# Patient Record
Sex: Male | Born: 1955 | Race: White | Hispanic: No | Marital: Married | Smoking: Never smoker
Health system: Southern US, Community
[De-identification: ages and names within clinical notes are randomized; demographics above are authoritative.]

## PROBLEM LIST (undated history)

## (undated) DIAGNOSIS — K219 Gastro-esophageal reflux disease without esophagitis: Secondary | ICD-10-CM

## (undated) DIAGNOSIS — F419 Anxiety disorder, unspecified: Secondary | ICD-10-CM

## (undated) DIAGNOSIS — Z87442 Personal history of urinary calculi: Secondary | ICD-10-CM

## (undated) DIAGNOSIS — I10 Essential (primary) hypertension: Secondary | ICD-10-CM

## (undated) DIAGNOSIS — E785 Hyperlipidemia, unspecified: Secondary | ICD-10-CM

## (undated) DIAGNOSIS — N400 Enlarged prostate without lower urinary tract symptoms: Secondary | ICD-10-CM

## (undated) DIAGNOSIS — T753XXA Motion sickness, initial encounter: Secondary | ICD-10-CM

## (undated) DIAGNOSIS — N200 Calculus of kidney: Secondary | ICD-10-CM

## (undated) DIAGNOSIS — G4730 Sleep apnea, unspecified: Secondary | ICD-10-CM

## (undated) DIAGNOSIS — L509 Urticaria, unspecified: Secondary | ICD-10-CM

## (undated) DIAGNOSIS — R55 Syncope and collapse: Secondary | ICD-10-CM

## (undated) DIAGNOSIS — K5730 Diverticulosis of large intestine without perforation or abscess without bleeding: Secondary | ICD-10-CM

## (undated) DIAGNOSIS — R3129 Other microscopic hematuria: Secondary | ICD-10-CM

## (undated) HISTORY — DX: Calculus of kidney: N20.0

## (undated) HISTORY — DX: Syncope and collapse: R55

## (undated) HISTORY — PX: TONSILLECTOMY: SUR1361

## (undated) HISTORY — DX: Gilbert syndrome: E80.4

## (undated) HISTORY — DX: Sleep apnea, unspecified: G47.30

## (undated) HISTORY — PX: LIVER BIOPSY: SHX301

## (undated) HISTORY — DX: Other microscopic hematuria: R31.29

## (undated) HISTORY — DX: Hyperlipidemia, unspecified: E78.5

## (undated) HISTORY — DX: Diverticulosis of large intestine without perforation or abscess without bleeding: K57.30

## (undated) HISTORY — DX: Urticaria, unspecified: L50.9

## (undated) HISTORY — DX: Benign prostatic hyperplasia without lower urinary tract symptoms: N40.0

## (undated) HISTORY — DX: Essential (primary) hypertension: I10

## (undated) HISTORY — PX: APPENDECTOMY: SHX54

---

## 1993-03-25 HISTORY — PX: UPPER GASTROINTESTINAL ENDOSCOPY: SHX188

## 2000-02-13 ENCOUNTER — Encounter: Payer: Self-pay | Admitting: Internal Medicine

## 2000-02-13 ENCOUNTER — Ambulatory Visit (HOSPITAL_COMMUNITY): Admission: RE | Admit: 2000-02-13 | Discharge: 2000-02-13 | Payer: Self-pay | Admitting: Internal Medicine

## 2000-03-25 HISTORY — PX: COLONOSCOPY W/ POLYPECTOMY: SHX1380

## 2000-04-11 ENCOUNTER — Other Ambulatory Visit: Admission: RE | Admit: 2000-04-11 | Discharge: 2000-04-11 | Payer: Self-pay | Admitting: Gastroenterology

## 2003-10-10 ENCOUNTER — Encounter: Admission: RE | Admit: 2003-10-10 | Discharge: 2003-10-10 | Payer: Self-pay | Admitting: Internal Medicine

## 2003-12-29 ENCOUNTER — Encounter (INDEPENDENT_AMBULATORY_CARE_PROVIDER_SITE_OTHER): Payer: Self-pay | Admitting: Specialist

## 2003-12-29 ENCOUNTER — Ambulatory Visit (HOSPITAL_COMMUNITY): Admission: RE | Admit: 2003-12-29 | Discharge: 2003-12-29 | Payer: Self-pay | Admitting: Gastroenterology

## 2004-02-10 ENCOUNTER — Ambulatory Visit: Payer: Self-pay | Admitting: Family Medicine

## 2004-02-21 ENCOUNTER — Ambulatory Visit: Payer: Self-pay | Admitting: Family Medicine

## 2004-03-22 ENCOUNTER — Ambulatory Visit: Payer: Self-pay | Admitting: Internal Medicine

## 2004-06-22 ENCOUNTER — Ambulatory Visit: Payer: Self-pay | Admitting: Internal Medicine

## 2004-06-29 ENCOUNTER — Ambulatory Visit: Payer: Self-pay | Admitting: Internal Medicine

## 2004-11-16 ENCOUNTER — Ambulatory Visit: Payer: Self-pay | Admitting: Internal Medicine

## 2005-07-26 ENCOUNTER — Ambulatory Visit: Payer: Self-pay | Admitting: Internal Medicine

## 2005-08-09 ENCOUNTER — Ambulatory Visit: Payer: Self-pay | Admitting: Internal Medicine

## 2005-09-16 ENCOUNTER — Ambulatory Visit: Payer: Self-pay | Admitting: Internal Medicine

## 2005-09-20 ENCOUNTER — Ambulatory Visit: Payer: Self-pay | Admitting: Internal Medicine

## 2005-09-24 ENCOUNTER — Ambulatory Visit: Payer: Self-pay | Admitting: Internal Medicine

## 2005-10-23 ENCOUNTER — Ambulatory Visit: Payer: Self-pay | Admitting: Internal Medicine

## 2006-02-26 ENCOUNTER — Ambulatory Visit: Payer: Self-pay | Admitting: Internal Medicine

## 2006-03-13 ENCOUNTER — Ambulatory Visit: Payer: Self-pay | Admitting: Internal Medicine

## 2007-02-23 DIAGNOSIS — R55 Syncope and collapse: Secondary | ICD-10-CM

## 2007-02-23 HISTORY — DX: Syncope and collapse: R55

## 2007-04-03 ENCOUNTER — Ambulatory Visit: Payer: Self-pay | Admitting: Internal Medicine

## 2007-04-03 DIAGNOSIS — R740 Nonspecific elevation of levels of transaminase and lactic acid dehydrogenase [LDH]: Secondary | ICD-10-CM

## 2007-04-03 DIAGNOSIS — R55 Syncope and collapse: Secondary | ICD-10-CM

## 2007-04-03 DIAGNOSIS — D126 Benign neoplasm of colon, unspecified: Secondary | ICD-10-CM | POA: Insufficient documentation

## 2007-04-07 ENCOUNTER — Ambulatory Visit: Payer: Self-pay | Admitting: Internal Medicine

## 2007-04-07 ENCOUNTER — Encounter (INDEPENDENT_AMBULATORY_CARE_PROVIDER_SITE_OTHER): Payer: Self-pay | Admitting: *Deleted

## 2007-04-17 ENCOUNTER — Ambulatory Visit: Payer: Self-pay | Admitting: Internal Medicine

## 2007-04-17 DIAGNOSIS — Z860100 Personal history of colon polyps, unspecified: Secondary | ICD-10-CM | POA: Insufficient documentation

## 2007-04-17 DIAGNOSIS — I10 Essential (primary) hypertension: Secondary | ICD-10-CM | POA: Insufficient documentation

## 2007-04-17 DIAGNOSIS — R5381 Other malaise: Secondary | ICD-10-CM | POA: Insufficient documentation

## 2007-04-17 DIAGNOSIS — R5383 Other fatigue: Secondary | ICD-10-CM

## 2007-09-10 ENCOUNTER — Ambulatory Visit: Payer: Self-pay | Admitting: Internal Medicine

## 2007-09-10 DIAGNOSIS — D1739 Benign lipomatous neoplasm of skin and subcutaneous tissue of other sites: Secondary | ICD-10-CM

## 2007-09-10 DIAGNOSIS — M7580 Other shoulder lesions, unspecified shoulder: Secondary | ICD-10-CM

## 2007-09-10 DIAGNOSIS — E785 Hyperlipidemia, unspecified: Secondary | ICD-10-CM | POA: Insufficient documentation

## 2007-09-20 LAB — CONVERTED CEMR LAB
ALT: 72 units/L — ABNORMAL HIGH (ref 0–53)
AST: 45 units/L — ABNORMAL HIGH (ref 0–37)
Basophils Absolute: 0 10*3/uL (ref 0.0–0.1)
Basophils Relative: 0.1 % (ref 0.0–1.0)
Cholesterol: 152 mg/dL (ref 0–200)
Eosinophils Absolute: 0.1 10*3/uL (ref 0.0–0.7)
Eosinophils Relative: 1.7 % (ref 0.0–5.0)
HCT: 43.4 % (ref 39.0–52.0)
HDL: 34.7 mg/dL — ABNORMAL LOW (ref >39.0)
Hemoglobin: 15.2 g/dL (ref 13.0–17.0)
LDL Cholesterol: 104 mg/dL — ABNORMAL HIGH (ref 0–99)
Lymphocytes Relative: 32.1 % (ref 12.0–46.0)
MCHC: 35.1 g/dL (ref 30.0–36.0)
MCV: 91.5 fL (ref 78.0–100.0)
Monocytes Absolute: 0.4 10*3/uL (ref 0.1–1.0)
Monocytes Relative: 8.2 % (ref 3.0–12.0)
Neutro Abs: 2.8 10*3/uL (ref 1.4–7.7)
Neutrophils Relative %: 57.9 % (ref 43.0–77.0)
PSA: 0.87 ng/mL (ref 0.10–4.00)
Platelets: 174 10*3/uL (ref 150–400)
RBC: 4.74 M/uL (ref 4.22–5.81)
RDW: 11.7 % (ref 11.5–14.6)
TSH: 2.8 microintl units/mL (ref 0.35–5.50)
Total CHOL/HDL Ratio: 4.4
Triglycerides: 65 mg/dL (ref 0–149)
VLDL: 13 mg/dL (ref 0–40)
WBC: 4.8 10*3/uL (ref 4.5–10.5)

## 2007-09-22 ENCOUNTER — Encounter (INDEPENDENT_AMBULATORY_CARE_PROVIDER_SITE_OTHER): Payer: Self-pay | Admitting: *Deleted

## 2007-09-28 ENCOUNTER — Encounter: Payer: Self-pay | Admitting: Internal Medicine

## 2007-11-09 ENCOUNTER — Encounter: Payer: Self-pay | Admitting: Internal Medicine

## 2008-03-25 HISTORY — PX: COLONOSCOPY: SHX174

## 2008-05-06 ENCOUNTER — Telehealth (INDEPENDENT_AMBULATORY_CARE_PROVIDER_SITE_OTHER): Payer: Self-pay | Admitting: *Deleted

## 2008-05-06 ENCOUNTER — Ambulatory Visit: Payer: Self-pay | Admitting: Internal Medicine

## 2008-05-06 DIAGNOSIS — R109 Unspecified abdominal pain: Secondary | ICD-10-CM | POA: Insufficient documentation

## 2008-05-06 DIAGNOSIS — R319 Hematuria, unspecified: Secondary | ICD-10-CM

## 2008-05-06 LAB — CONVERTED CEMR LAB
Bilirubin Urine: NEGATIVE
Glucose, Urine, Semiquant: NEGATIVE
Ketones, urine, test strip: NEGATIVE
Nitrite: NEGATIVE
Protein, U semiquant: NEGATIVE
Specific Gravity, Urine: 1.015
Urobilinogen, UA: 0.2
WBC Urine, dipstick: NEGATIVE
pH: 7

## 2008-05-09 ENCOUNTER — Encounter (INDEPENDENT_AMBULATORY_CARE_PROVIDER_SITE_OTHER): Payer: Self-pay | Admitting: *Deleted

## 2008-05-09 ENCOUNTER — Telehealth (INDEPENDENT_AMBULATORY_CARE_PROVIDER_SITE_OTHER): Payer: Self-pay | Admitting: *Deleted

## 2008-05-25 ENCOUNTER — Encounter: Payer: Self-pay | Admitting: Internal Medicine

## 2008-06-15 ENCOUNTER — Ambulatory Visit: Payer: Self-pay | Admitting: Internal Medicine

## 2008-06-15 DIAGNOSIS — N400 Enlarged prostate without lower urinary tract symptoms: Secondary | ICD-10-CM | POA: Insufficient documentation

## 2008-06-15 LAB — CONVERTED CEMR LAB
Bilirubin Urine: NEGATIVE
Glucose, Urine, Semiquant: NEGATIVE
Ketones, urine, test strip: NEGATIVE
Nitrite: NEGATIVE
Protein, U semiquant: NEGATIVE
Specific Gravity, Urine: 1.025
Urobilinogen, UA: 0.2
WBC Urine, dipstick: NEGATIVE
pH: 6

## 2008-06-19 LAB — CONVERTED CEMR LAB
ALT: 42 units/L (ref 0–53)
Albumin: 4.1 g/dL (ref 3.5–5.2)
Alkaline Phosphatase: 76 units/L (ref 39–117)
Bilirubin, Direct: 0 mg/dL (ref 0.0–0.3)
Calcium: 9 mg/dL (ref 8.4–10.5)
Total Protein: 7.2 g/dL (ref 6.0–8.3)

## 2008-06-21 ENCOUNTER — Encounter (INDEPENDENT_AMBULATORY_CARE_PROVIDER_SITE_OTHER): Payer: Self-pay | Admitting: *Deleted

## 2008-07-08 ENCOUNTER — Encounter: Payer: Self-pay | Admitting: Internal Medicine

## 2008-12-06 ENCOUNTER — Ambulatory Visit: Payer: Self-pay | Admitting: Gastroenterology

## 2008-12-29 ENCOUNTER — Telehealth: Payer: Self-pay | Admitting: Gastroenterology

## 2009-01-02 ENCOUNTER — Ambulatory Visit: Payer: Self-pay | Admitting: Gastroenterology

## 2009-01-02 LAB — HM COLONOSCOPY
HM Colonoscopy: NORMAL
HM Colonoscopy: NORMAL

## 2009-01-10 ENCOUNTER — Encounter: Payer: Self-pay | Admitting: Internal Medicine

## 2009-01-13 ENCOUNTER — Ambulatory Visit: Payer: Self-pay | Admitting: Family Medicine

## 2009-01-13 DIAGNOSIS — M79609 Pain in unspecified limb: Secondary | ICD-10-CM

## 2009-08-03 ENCOUNTER — Ambulatory Visit: Payer: Self-pay | Admitting: Internal Medicine

## 2009-08-03 LAB — CONVERTED CEMR LAB
Ketones, urine, test strip: NEGATIVE
Nitrite: NEGATIVE
Specific Gravity, Urine: 1.025
Urobilinogen, UA: 0.2
WBC Urine, dipstick: NEGATIVE

## 2009-08-10 LAB — CONVERTED CEMR LAB
ALT: 34 units/L (ref 0–53)
AST: 28 units/L (ref 0–37)
Albumin: 4.4 g/dL (ref 3.5–5.2)
Alkaline Phosphatase: 67 units/L (ref 39–117)
BUN: 20 mg/dL (ref 6–23)
Basophils Absolute: 0 10*3/uL (ref 0.0–0.1)
CO2: 30 meq/L (ref 19–32)
Calcium: 9.2 mg/dL (ref 8.4–10.5)
Chloride: 106 meq/L (ref 96–112)
Creatinine, Ser: 1 mg/dL (ref 0.4–1.5)
Eosinophils Relative: 1.7 % (ref 0.0–5.0)
HCT: 45.7 % (ref 39.0–52.0)
Hemoglobin: 15.8 g/dL (ref 13.0–17.0)
LDL Cholesterol: 112 mg/dL — ABNORMAL HIGH (ref 0–99)
Lymphocytes Relative: 36.2 % (ref 12.0–46.0)
Lymphs Abs: 1.6 10*3/uL (ref 0.7–4.0)
Monocytes Relative: 9.7 % (ref 3.0–12.0)
Neutro Abs: 2.2 10*3/uL (ref 1.4–7.7)
PSA: 0.89 ng/mL (ref 0.10–4.00)
RBC: 4.96 M/uL (ref 4.22–5.81)
RDW: 12.8 % (ref 11.5–14.6)
TSH: 2.94 microintl units/mL (ref 0.35–5.50)
Total CHOL/HDL Ratio: 4
Triglycerides: 94 mg/dL (ref 0.0–149.0)
WBC: 4.3 10*3/uL — ABNORMAL LOW (ref 4.5–10.5)

## 2009-08-11 ENCOUNTER — Ambulatory Visit: Payer: Self-pay | Admitting: Internal Medicine

## 2009-08-11 DIAGNOSIS — M545 Low back pain: Secondary | ICD-10-CM

## 2009-08-11 DIAGNOSIS — K5730 Diverticulosis of large intestine without perforation or abscess without bleeding: Secondary | ICD-10-CM | POA: Insufficient documentation

## 2009-08-24 ENCOUNTER — Encounter: Payer: Self-pay | Admitting: Internal Medicine

## 2009-12-27 ENCOUNTER — Encounter: Payer: Self-pay | Admitting: Internal Medicine

## 2010-04-22 LAB — CONVERTED CEMR LAB
ALT: 52 units/L (ref 0–53)
AST: 37 units/L (ref 0–37)
Albumin: 4.3 g/dL (ref 3.5–5.2)
Alkaline Phosphatase: 71 units/L (ref 39–117)
BUN: 13 mg/dL (ref 6–23)
Basophils Absolute: 0 10*3/uL (ref 0.0–0.1)
Basophils Relative: 0 % (ref 0.0–1.0)
Bilirubin, Direct: 0.1 mg/dL (ref 0.0–0.3)
CO2: 27 meq/L (ref 19–32)
Calcium: 9.4 mg/dL (ref 8.4–10.5)
Chloride: 104 meq/L (ref 96–112)
Cholesterol: 153 mg/dL (ref 0–200)
Creatinine, Ser: 1.1 mg/dL (ref 0.4–1.5)
Eosinophils Absolute: 0.1 10*3/uL (ref 0.0–0.6)
Eosinophils Relative: 1.4 % (ref 0.0–5.0)
GFR calc Af Amer: 90 mL/min
GFR calc non Af Amer: 74 mL/min
Glucose, Bld: 107 mg/dL — ABNORMAL HIGH (ref 70–99)
HCT: 46.6 % (ref 39.0–52.0)
HDL: 27.5 mg/dL — ABNORMAL LOW (ref >39.0)
Hemoglobin: 16.1 g/dL (ref 13.0–17.0)
LDL Cholesterol: 106 mg/dL — ABNORMAL HIGH (ref 0–99)
Lymphocytes Relative: 33.2 % (ref 12.0–46.0)
MCHC: 34.6 g/dL (ref 30.0–36.0)
MCV: 91.1 fL (ref 78.0–100.0)
Monocytes Absolute: 0.4 10*3/uL (ref 0.2–0.7)
Monocytes Relative: 8.4 % (ref 3.0–11.0)
Neutro Abs: 2.6 10*3/uL (ref 1.4–7.7)
Neutrophils Relative %: 57 % (ref 43.0–77.0)
Platelets: 195 10*3/uL (ref 150–400)
Potassium: 4.7 meq/L (ref 3.5–5.1)
RBC: 5.12 M/uL (ref 4.22–5.81)
RDW: 11.8 % (ref 11.5–14.6)
Sodium: 139 meq/L (ref 135–145)
TSH: 3.74 microintl units/mL (ref 0.35–5.50)
Total Bilirubin: 1 mg/dL (ref 0.3–1.2)
Total CHOL/HDL Ratio: 5.6
Total Protein: 7.1 g/dL (ref 6.0–8.3)
Triglycerides: 96 mg/dL (ref 0–149)
VLDL: 19 mg/dL (ref 0–40)
WBC: 4.6 10*3/uL (ref 4.5–10.5)

## 2010-04-24 NOTE — Miscellaneous (Signed)
Summary: Flu/[facility]  Flu/[facility]   Imported By: Lanelle Bal 04/14/2009 09:35:30  _____________________________________________________________________  External Attachment:    Type:   Image     Comment:   External Document

## 2010-04-24 NOTE — Miscellaneous (Signed)
Summary: Flu/[facility]  Flu/[facility]   Imported By: Lanelle Bal 01/13/2010 10:22:38  _____________________________________________________________________  External Attachment:    Type:   Image     Comment:   External Document

## 2010-04-24 NOTE — Assessment & Plan Note (Signed)
Summary: CPX,LABS PRIOR, RENEW MEDS,BCBS INS/RH.   Vital Signs:  Patient profile:   55 year old male Height:      73 inches Weight:      178.2 pounds BMI:     23.60 Temp:     98.4 degrees F oral Pulse rate:   80 / minute Resp:     14 per minute BP sitting:   110 / 62  (left arm) Cuff size:   large  Vitals Entered By: Shonna Chock (Aug 11, 2009 3:49 PM)  CC: Lipid Management Comments REVIEWED MED LIST, PATIENT AGREED DOSE AND INSTRUCTION CORRECT    CC:  Lipid Management.  History of Present Illness: Steven Wolf is here for a physical; he has had some intermittent R > L LS area pain for 3-4 weeks. Prior episodes in past 5 years w/o trigger or injury.Rx: none  Lipid Management History:      Positive NCEP/ATP III risk factors include male age 57 years old or older, HDL cholesterol less than 40, family history for ischemic heart disease (males less than 77 years old), and hypertension.  Negative NCEP/ATP III risk factors include non-diabetic, non-tobacco-user status, no ASHD (atherosclerotic heart disease), no prior stroke/TIA, no peripheral vascular disease, and no history of aortic aneurysm.     Preventive Screening-Counseling & Management  Alcohol-Tobacco     Smoking Status: never  Caffeine-Diet-Exercise     Does Patient Exercise: yes  Allergies: 1)  ! Pcn 2)  ! * Altace  Past History:  Past Medical History: Hypertension Colonic polyps, hx of nonspec elevation of levels of transaminase, PMH of syncope 02/2007, ? nocturnal  post micturation  Benign prostatic hypertrophy Diverticulosis, colon Microscpoic hematuria , [facility] Hyperlipidemia: [place] LDL goal = < 130  Past Surgical History: Liver biopsy for 790.4(? Altace related) Appendectomy Colon polypectomy 2002; tics  2005 & 2010, due 2020, Dr Jarold Motto Tonsillectomy X 2  Family History: Father: PCV,MI in his 26s, smoker Mother: breast cancer, RA Siblings: bro: negative P ncle: PCV,  leukemia  Social History: no diet Married Never Smoked Alcohol use-yes: minimally Regular exercise-yes:walking Occupation: Advice worker Smoking Status:  never Does Patient Exercise:  yes  Review of Systems General:  Denies chills, fatigue, fever, sleep disorder, sweats, and weight loss. Eyes:  Denies blurring, double vision, and vision loss-both eyes. ENT:  Denies difficulty swallowing and hoarseness. CV:  Denies chest pain or discomfort, leg cramps with exertion, palpitations, swelling of feet, and swelling of hands. Resp:  Denies cough, shortness of breath, and sputum productive. GI:  Denies abdominal pain, bloody stools, dark tarry stools, and indigestion. GU:  Denies discharge, dysuria, hematuria, and incontinence. MS:  Complains of low back pain; denies joint pain, joint redness, joint swelling, mid back pain, and thoracic pain. Derm:  Denies changes in nail beds, dryness, lesion(s), and rash; Tick 1 & 1/2 weeks ago. Neuro:  Denies brief paralysis, numbness, tingling, and weakness. Psych:  Denies anxiety and depression; Fluoxetine is beneficial. Endo:  Denies cold intolerance, excessive hunger, excessive thirst, excessive urination, and heat intolerance. Heme:  Denies abnormal bruising and bleeding. Allergy:  Complains of itching eyes; denies sneezing.  Physical Exam  General:  well-nourished; alert,appropriate and cooperative throughout examination Head:  Normocephalic and atraumatic without obvious abnormalities.  Eyes:  No corneal or conjunctival inflammation noted.No icterus.Perrla. Funduscopic exam benign, without hemorrhages, exudates or papilledema.  Ears:  External ear exam shows no significant lesions or deformities.  Otoscopic examination reveals clear canals, tympanic membranes are intact bilaterally  without bulging, retraction, inflammation or discharge. Hearing is grossly normal bilaterally. Nose:  External nasal examination shows no deformity or inflammation.  Nasal mucosa are pink and moist without lesions or exudates. Mouth:  Oral mucosa and oropharynx without lesions or exudates.  Teeth in good repair. Neck:  No deformities, masses, or tenderness noted. Lungs:  Normal respiratory effort, chest expands symmetrically. Lungs are clear to auscultation, no crackles or wheezes. Heart:  Normal rate and regular rhythm. S1 and S2 normal without gallop, murmur, click, rub or other extra sounds. Abdomen:  Bowel sounds positive,abdomen soft and non-tender without masses, organomegaly or hernias noted. Rectal:  Coolonoscopy 12/2008 Genitalia:  Urology 08/24/2009 Prostate:  see above Msk:  No deformity or scoliosis noted of thoracic or lumbar spine.   sat up w/o help Pulses:  R and L carotid,radial,dorsalis pedis and posterior tibial pulses are full and equal bilaterally Extremities:  No clubbing, cyanosis, edema, or deformity noted with normal full range of motion of all joints.   Neg SLR Neurologic:  alert & oriented X3, strength normal in all extremities, heel & toe gait normal, and DTRs symmetrical and normal.     Impression & Recommendations:  Problem # 1:  ROUTINE GENERAL MEDICAL EXAM@HEALTH  CARE FACL (ICD-V70.0)  Orders: EKG w/ Interpretation (93000)  Problem # 2:  HYPERLIPIDEMIA (ICD-272.4)  Orders: EKG w/ Interpretation (93000)  Problem # 3:  LOW BACK PAIN SYNDROME (ICD-724.2)  The following medications were removed from the medication list:    Tramadol Hcl 50 Mg Tabs (Tramadol hcl) .Marland Kitchen... 1-2 every 4 hours as needed His updated medication list for this problem includes:    Adult Aspirin Ec Low Strength 81 Mg Tbec (Aspirin) ..... Once daily  Problem # 4:  HEMATURIA (ICD-599.70) PMH of, as per Urology  Problem # 5:  HYPERTENSION, ESSENTIAL NOS (ICD-401.9) controlled His updated medication list for this problem includes:    Diovan 160 Mg Tabs (Valsartan) .Marland Kitchen... 1/2 once daily  Problem # 6:  NONSPEC ELEVATION OF LEVELS OF  TRANSAMINASE/LDH (ICD-790.4) resolved  Problem # 7:  CORONARY ARTERY DISEASE, PREMATURE, FAMILY HX (ICD-V17.3) F MI in 57s, but co-morbidities present Orders: EKG w/ Interpretation (93000)  Complete Medication List: 1)  Adult Aspirin Ec Low Strength 81 Mg Tbec (Aspirin) .... Once daily 2)  Co Q-10 Vitamin E Fish Oil 60-90-25-200 Caps (Dha-epa-coenzyme q10-vitamin e) .... Qd 3)  Diovan 160 Mg Tabs (Valsartan) .... 1/2 once daily 4)  Fish Oil  .... 2 daily 5)  Vit D 1000iu  .... Qd 6)  Prostate Health Complex  .... Qd 7)  Fluoxetine Hcl 10 Mg Tabs (Fluoxetine hcl) .Marland Kitchen.. 1 qd  Lipid Assessment/Plan:      Based on NCEP/ATP III, the patient's risk factor category is "2 or more risk factors and a calculated 10 year CAD risk of < 20%".  The patient's lipid goals are as follows: Total cholesterol goal is 200; LDL cholesterol goal is 130; HDL cholesterol goal is 40; Triglyceride goal is 150.  His LDL cholesterol goal has been met.    Patient Instructions: 1)  Back hygiene & exercises as discussed Prescriptions: FLUOXETINE HCL 10 MG  TABS (FLUOXETINE HCL) 1 qd  #90 Capsule x 3   Entered and Authorized by:   Marga Melnick MD   Signed by:   Marga Melnick MD on 08/11/2009   Method used:   Print then Give to Patient   RxID:   [id number] DIOVAN 160 MG  TABS (VALSARTAN) 1/2 once daily  #90  x 3   Entered and Authorized by:   Marga Melnick MD   Signed by:   Marga Melnick MD on 08/11/2009   Method used:   Print then Give to Patient   RxID:   [id number]

## 2010-04-24 NOTE — Consult Note (Signed)
Summary: [facility]  [facility]   Imported By: Lanelle Bal 09/01/2009 11:35:03  _____________________________________________________________________  External Attachment:    Type:   Image     Comment:   External Document

## 2010-06-07 ENCOUNTER — Ambulatory Visit (INDEPENDENT_AMBULATORY_CARE_PROVIDER_SITE_OTHER): Payer: BC Managed Care – PPO | Admitting: Internal Medicine

## 2010-06-07 ENCOUNTER — Encounter: Payer: Self-pay | Admitting: Internal Medicine

## 2010-06-07 DIAGNOSIS — R109 Unspecified abdominal pain: Secondary | ICD-10-CM

## 2010-06-07 DIAGNOSIS — R319 Hematuria, unspecified: Secondary | ICD-10-CM

## 2010-06-07 DIAGNOSIS — M545 Low back pain: Secondary | ICD-10-CM

## 2010-06-07 DIAGNOSIS — R300 Dysuria: Secondary | ICD-10-CM

## 2010-06-07 LAB — CONVERTED CEMR LAB
Bilirubin Urine: NEGATIVE
Glucose, Urine, Semiquant: NEGATIVE
Protein, U semiquant: NEGATIVE
Urobilinogen, UA: 0.2
pH: 7.5

## 2010-06-12 NOTE — Assessment & Plan Note (Signed)
Summary: think he has a kindney stone--PH   Vital Signs:  Patient profile:   55 year old male Weight:      179.6 pounds BMI:     23.78 Temp:     98.7 degrees F oral Pulse rate:   72 / minute Resp:     15 per minute BP sitting:   110 / 70  (right arm) Cuff size:   large  Vitals Entered By: Shonna Chock CMA (June 07, 2010 3:27 PM) CC: ? Kidney stone: dark colored urine and left lower back pain , Dysuria   CC:  ? Kidney stone: dark colored urine and left lower back pain  and Dysuria.  History of Present Illness:    Onset last week as dark urine x1;   this am he had 5 min of sharp L flank pain , "like a bad muscle cramp" followed by recurrence of dark urine. He  denies burning with urination, urinary frequency, and penile discharge.  The patient denies the following associated symptoms: nausea, vomiting, fever, shaking chills, abdominal pain, back pain, and pelvic pain.  History is significant for presumed  kidney stones in 2010; he was seen by Urology but stone   never isolated.   Current Medications (verified): 1)  Adult Aspirin Ec Low Strength 81 Mg  Tbec (Aspirin) .... Once Daily 2)  Co Q-10 Vitamin E Fish Oil 60-90-25-200  Caps (Dha-Epa-Coenzyme Q10-Vitamin E) .... Qd 3)  Diovan 160 Mg  Tabs (Valsartan) .... 1/2 Once Daily 4)  Fish Oil .... 2 Daily 5)  Vit D 1000iu .... Qd 6)  Prostate Health Complex .... Qd 7)  Fluoxetine Hcl 10 Mg  Tabs (Fluoxetine Hcl) .... 1/2 By Mouth Once Daily  Allergies: 1)  ! Pcn 2)  ! * Altace  Physical Exam  General:  well-nourished,in no acute distress; alert,appropriate and cooperative throughout examination Eyes:  No corneal or conjunctival inflammation noted. No icterus Heart:  Normal rate and regular rhythm. S1 and S2 normal without gallop, murmur, click, rub.S4 with slurring Abdomen:  Bowel sounds positive,abdomen soft and non-tender without masses, organomegaly or hernias noted. Msk:  No flank tenderness; neg SLR Skin:  Intact without  suspicious lesions or rashes   Impression & Recommendations:  Problem # 1:  FLANK PAIN, LEFT (ICD-789.09) R/O renal calculus His updated medication list for this problem includes:    Adult Aspirin Ec Low Strength 81 Mg Tbec (Aspirin) ..... Once daily    Hydrocodone-acetaminophen 5-500 Mg Tabs (Hydrocodone-acetaminophen) .Marland Kitchen... 1-2 every 6 hrs as needed for pain  Problem # 2:  HEMATURIA UNSPECIFIED (ICD-599.70)  Orders: Durable Medical Equipment (DME)  Complete Medication List: 1)  Adult Aspirin Ec Low Strength 81 Mg Tbec (Aspirin) .... Once daily 2)  Co Q-10 Vitamin E Fish Oil 60-90-25-200 Caps (Dha-epa-coenzyme q10-vitamin e) .... Qd 3)  Diovan 160 Mg Tabs (Valsartan) .... 1/2 once daily 4)  Fish Oil  .... 2 daily 5)  Vit D 1000iu  .... Qd 6)  Prostate Health Complex  .... Qd 7)  Fluoxetine Hcl 10 Mg Tabs (Fluoxetine hcl) .... 1/2 by mouth once daily 8)  Hydrocodone-acetaminophen 5-500 Mg Tabs (Hydrocodone-acetaminophen) .Marland Kitchen.. 1-2 every 6 hrs as needed for pain  Other Orders: UA Dipstick w/o Micro (manual) (86578) Specimen Handling (99000) T-Culture, Urine ([id number])  Patient Instructions: 1)  Strain urine . 2)  Drink as much fluid as you can tolerate for the next few days. To ER if worse with this note. Prescriptions: HYDROCODONE-ACETAMINOPHEN 5-500 MG TABS (HYDROCODONE-ACETAMINOPHEN)  1-2 every 6 hrs as needed for pain  #30 x 0   Entered and Authorized by:   Marga Melnick MD   Signed by:   Marga Melnick MD on 06/07/2010   Method used:   Print then Give to Patient   RxID:   [id number]    Orders Added: 1)  UA Dipstick w/o Micro (manual) [81002] 2)  Specimen Handling [99000] 3)  T-Culture, Urine [[id number]] 4)  Est. Patient Level III [30865] 5)  Durable Medical Equipment [DME]    Laboratory Results   Urine Tests    Routine Urinalysis   Color: yellow Appearance: Cloudy Glucose: negative   (Normal Range: Negative) Bilirubin: negative   (Normal  Range: Negative) Ketone: negative   (Normal Range: Negative) Spec. Gravity: 1.015   (Normal Range: 1.003-1.035) Blood: large   (Normal Range: Negative) pH: 7.5   (Normal Range: 5.0-8.0) Protein: negative   (Normal Range: Negative) Urobilinogen: 0.2   (Normal Range: 0-1) Nitrite: negative   (Normal Range: Negative) Leukocyte Esterace: negative   (Normal Range: Negative)    Comments: sent for culture

## 2010-08-10 NOTE — Assessment & Plan Note (Signed)
[facility]                          [place] OFFICE NOTE   Steven Wolf, Steven Wolf                    MRN:          [id number]  DATE:10/23/2005                            DOB:          03/22/1956    Steven Wolf [facility]) has had various paresthesias and admits to being  concerned about having multiple sclerosis.   He first described burning in his tongue in May 2007.  He stated that he had  noted a metallic taste after taking antibiotics and after dental work in the  fall of 2006.  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  Observation was recommended.  I recommended multivitamins.   On June 25, he stated that the metallic taste had resolved after several  weeks but he continued to have a burning sensation along the lateral aspect  of his tongue.  It was better in the morning and worse with certain foods.  Acidic or bitter foods would aggravate it.  Exam revealed slight ptosis of  the right eye but no localizing neurologic signs.  He does have slight  asymmetry of the nasolabial folds.  He also has thinning of the eyebrows  laterally.   CBC and differential and CMET were normal except for mild elevation of the  SGOT and SGPT.  Hepatic enzyme elevations had been evaluated by Dr.  Jarold Motto to include a liver biopsy which was negative.   Normal negative studies include free T4, TSH, folate and B12 levels.   He was seen July 3 complaining of burning in his calves.  He was concerned  because he was going hiking in [place] on the 4th.  At that time he stated  that the paresthesias at the time were actually improving.   Musculoskeletal exam was unremarkable.  A combination of magnesium and  calcium was recommended along with isometrics for the cramps.   He was seen October 23, 2005, complaining of burning in his legs and twitching  in the muscles of the calves with associated weakness.  The symptoms were  greater  on the left than the right.  He also noted aching in the left arm  with extension to the fifth digit.   The burning in his calves was noted to be present 60-70% of the time, less  at night.  The twitching was noted to be an intermediate phenomenon.  Additionally, he had aching occasionally in the left shoulder.  The  paresthesias in the fifth digit actually began several years prior.   He denies diplopia or dysphagia.   He does admit to some anxiety.  Previously fluoxetine had been recommended  to him but the prescription was not filled.   His past history includes:  1.  Appendectomy.  2.  Tonsillectomy.  3.  Mononucleosis.  4.  Elevated liver enzymes.  5.  He has been found to have colon polyps in the past.   There is no family history of neuromuscular disease.   He admits to reviewing his symptoms on the Web and is concerned that he  might have MS or Steven Wolf disease.  The neurologic exam revealed no new findings.  He did have slight difficulty  with alliteration.   Because of the burning and instability in his legs which results in  stumbling at times,  LDH-I, aldolase, CPK and sedimentation rate were  performed.  Pending are the aldolase and LDH.  His creatinine kinase is  normal and sedimentation rate is normal.   I shall refer him to the neurologist to evaluate the glossodynia, ulnar  paresthesias and the muscular weakness.   I will defer imaging or EMG and nerve conduction studies to the neurologist.                                   Steven Wolf. Alwyn Ren, MD, [facility]   [facility]  DD:  10/28/2005  DT:  10/28/2005  Job #:  [id number]

## 2010-09-15 ENCOUNTER — Other Ambulatory Visit: Payer: Self-pay | Admitting: Internal Medicine

## 2010-11-10 ENCOUNTER — Other Ambulatory Visit: Payer: Self-pay | Admitting: Internal Medicine

## 2010-12-29 ENCOUNTER — Other Ambulatory Visit: Payer: Self-pay | Admitting: Internal Medicine

## 2011-02-04 ENCOUNTER — Other Ambulatory Visit: Payer: Self-pay | Admitting: Internal Medicine

## 2011-02-11 ENCOUNTER — Encounter: Payer: Self-pay | Admitting: Internal Medicine

## 2011-02-12 ENCOUNTER — Encounter: Payer: Self-pay | Admitting: Internal Medicine

## 2011-02-12 ENCOUNTER — Ambulatory Visit (INDEPENDENT_AMBULATORY_CARE_PROVIDER_SITE_OTHER): Payer: BC Managed Care – PPO | Admitting: Internal Medicine

## 2011-02-12 VITALS — BP 120/78 | HR 73 | Temp 98.4°F | Resp 12 | Ht 72.0 in | Wt 181.4 lb

## 2011-02-12 DIAGNOSIS — I10 Essential (primary) hypertension: Secondary | ICD-10-CM

## 2011-02-12 DIAGNOSIS — Z0000 Encounter for general adult medical examination without abnormal findings: Secondary | ICD-10-CM

## 2011-02-12 DIAGNOSIS — E785 Hyperlipidemia, unspecified: Secondary | ICD-10-CM

## 2011-02-12 NOTE — Patient Instructions (Signed)
Preventive Health Care: Exercise at least 30-45 minutes a day,  3-4 days a week.  Eat a low-fat diet with lots of fruits and vegetables, up to 7-9 servings per day. Consume less than 40 grams of sugar per day from foods & drinks with High Fructose Corn Sugar as # 1,2,3 or # 4 on label. Health Care Power of Attorney & Living Will. Complete if not in place ; these place you in charge of your health care decisions. 

## 2011-02-12 NOTE — Progress Notes (Signed)
Subjective:    Patient ID: Steven Wolf, male    DOB: June 29, 1955, 55 y.o.   MRN: [id number]  HPI  Steven Wolf  is here for a physical; he denies acute issues.      Review of Systems Patient reports no  vision/ hearing changes,anorexia, weight change, fever ,adenopathy, persistant / recurrent hoarseness, swallowing issues, chest pain,palpitations, edema,persistant / recurrent cough, hemoptysis, dyspnea(rest, exertional, paroxysmal nocturnal), gastrointestinal  bleeding (melena, rectal bleeding), abdominal pain, excessive heart burn, GU symptoms( dysuria, hematuria, pyuria) syncope, focal weakness, memory loss,numbness & tingling, skin/hair/nail changes,depression, anxiety, or abnormal bruising/bleeding. Only musculoskeletal symptoms/signs are pain with elevation &posterior rotation  in R shoulder.These had responded to exercises Rxed by Ortho . Nocturia X 1 / night. BP range 112-114/70-72.Urology seen 5/12 for calculi. He describes dryness of the palate; he admits to decreased fluid intake.     Objective:   Physical Exam Gen.: Thin but healthy and well-nourished in appearance. Alert, appropriate and cooperative throughout exam. Head: Normocephalic without obvious abnormalities;  pattern alopecia  Eyes: No corneal or conjunctival inflammation noted. Pupils equal round reactive to light and accommodation. Fundal exam is benign without hemorrhages, exudate, papilledema. Extraocular motion intact. Vision grossly normal. Ears: External  ear exam reveals no significant lesions or deformities. Canals clear .TMs normal. Hearing is grossly normal bilaterally. Nose: External nasal exam reveals no deformity or inflammation. Nasal mucosa are pink and moist. No lesions or exudates noted.  Mouth: Oral mucosa and oropharynx reveal no lesions or exudates. Teeth in good repair. Neck: No deformities, masses, or tenderness noted. Range of motion & . Thyroid normal. Lungs: Normal respiratory effort; chest expands  symmetrically. Lungs are clear to auscultation without rales, wheezes, or increased work of breathing. Heart: Normal rate and rhythm. Normal S1 and S2. No gallop, click, or rub. S 4 w/o  murmur. Abdomen: Bowel sounds normal; abdomen soft and nontender. No masses, organomegaly or hernias noted. Genitalia/ DRE: done 5/12 by Urologist; annual monitor recommended. Note: PSA 0.73- 0.89   .                                                                                   Musculoskeletal/extremities: No deformity or scoliosis noted of  the thoracic or lumbar spine. No clubbing, cyanosis, edema, or deformity noted. Range of motion  normal .Tone & strength  normal.Joints normal. Nail health  good. Vascular: Carotid, radial artery, dorsalis pedis and  posterior tibial pulses are full and equal. No bruits present. Neurologic: Alert and oriented x3. Deep tendon reflexes symmetrical and normal.          Skin: Intact without suspicious lesions or rashes. Lymph: No cervical, axillary, or inguinal lymphadenopathy present. Psych: Mood and affect are normal. Normally interactive  Assessment & Plan:  #1 comprehensive physical exam; no acute findings #2 see Problem List with Assessments & Recommendations  #3 shoulder impingement syndrome; and he'll be  asked to resume effective exercises.  #4 he does have mild elevation of his LDL decreased HDL. His father had heart attack at 17 but also had polycythemia. He is not taking statins as he has had elevated liver function tests for which hepatic biopsy was completed in the past. Advanced testing has not been  completed, but are indicated because of these facts.   Plan: see Orders

## 2011-02-13 LAB — CBC WITH DIFFERENTIAL/PLATELET
Basophils Relative: 0.5 % (ref 0.0–3.0)
Eosinophils Relative: 1.1 % (ref 0.0–5.0)
HCT: 46.8 % (ref 39.0–52.0)
Hemoglobin: 15.9 g/dL (ref 13.0–17.0)
Lymphs Abs: 1.8 10*3/uL (ref 0.7–4.0)
MCHC: 34 g/dL (ref 30.0–36.0)
MCV: 92.2 fl (ref 78.0–100.0)
Monocytes Absolute: 0.5 10*3/uL (ref 0.1–1.0)
Neutro Abs: 3.1 10*3/uL (ref 1.4–7.7)
Neutrophils Relative %: 56.2 % (ref 43.0–77.0)
RBC: 5.07 Mil/uL (ref 4.22–5.81)
WBC: 5.4 10*3/uL (ref 4.5–10.5)

## 2011-02-13 LAB — HEPATIC FUNCTION PANEL
ALT: 46 U/L (ref 0–53)
AST: 35 U/L (ref 0–37)
Total Bilirubin: 1.5 mg/dL — ABNORMAL HIGH (ref 0.3–1.2)
Total Protein: 7.6 g/dL (ref 6.0–8.3)

## 2011-02-13 LAB — BASIC METABOLIC PANEL
CO2: 30 mEq/L (ref 19–32)
Chloride: 103 mEq/L (ref 96–112)
Potassium: 4.1 mEq/L (ref 3.5–5.1)
Sodium: 140 mEq/L (ref 135–145)

## 2011-03-08 ENCOUNTER — Encounter: Payer: Self-pay | Admitting: Internal Medicine

## 2011-03-14 ENCOUNTER — Ambulatory Visit (INDEPENDENT_AMBULATORY_CARE_PROVIDER_SITE_OTHER): Payer: BC Managed Care – PPO | Admitting: Internal Medicine

## 2011-03-14 ENCOUNTER — Encounter: Payer: Self-pay | Admitting: Internal Medicine

## 2011-03-14 VITALS — BP 116/80 | HR 83 | Wt 184.4 lb

## 2011-03-14 DIAGNOSIS — E785 Hyperlipidemia, unspecified: Secondary | ICD-10-CM

## 2011-03-14 NOTE — Progress Notes (Signed)
  Subjective:    Patient ID: Steven Wolf, male    DOB: January 02, 1956, 55 y.o.   MRN: [id number]  HPI Dyslipidemia assessment: Prior Advanced Lipid Testing: no Family history of premature CAD/ MI: father MI @ 85 in context of Polycythemia  .  Nutrition: no plan .  Exercise: intermittent . Diabetes : no; insulin level 5 . HTN: BP averages < 120/80. Smoking history  : never .   Weight :  Stable to 5 # gain. ROS: fatigue: no ; chest pain : no ;claudication: no; palpitations: no; abd pain/bowel changes: no ; myalgias:no but cramps in arches of feet @ night;  syncope : no ; memory loss: no;skin changes: no. Lab results reviewed : [facility]: LDL goal = < 125; hyperabsorber  His most recent liver function tests were perfectly normal.    Review of Systems     Objective:   Physical Exam  He appears healthy and well-nourished; ideal weight is suggested clinically.  He has an S4 with no murmurs or gallops.  All pulses are intact with no deficits or bruits.  He has no organomegaly or masses. There is no dullness to percussion in right upper quadrant.  Skin is clear with no jaundice . 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 He has a past medical history of intermittent liver function elevations which makes administration of a statin undesirable. At present his LDL  is 20% above minimal goal in approximately 45% above ideal goal.  He will be referred to Dr. Elige Ko book Eat, Drink, and Be  Healthy for the best information on dietary cholesterol. I'll ask him to review these data and decide whether he wishes to take his Zetia to help prevent production absorption

## 2011-03-14 NOTE — Patient Instructions (Signed)
Please review Dr Gildardo Griffes book Eat, Drink & Be Healthy for dietary cholesterol information. Please  schedule fasting Labs :  Lipid Panel after 4 months of dietary changes.PLEASE BRING THESE INSTRUCTIONS TO FOLLOW UP  LAB APPOINTMENT.This will guarantee correct labs are drawn, eliminating need for repeat blood sampling ( needle sticks ! ). Diagnoses /Codes: 272.4  Exercise at least 30-45 minutes a day,  3-4 days a week.

## 2011-06-28 ENCOUNTER — Telehealth: Payer: Self-pay | Admitting: Internal Medicine

## 2011-06-28 DIAGNOSIS — D229 Melanocytic nevi, unspecified: Secondary | ICD-10-CM

## 2011-06-28 NOTE — Telephone Encounter (Signed)
Referral done

## 2011-06-28 NOTE — Telephone Encounter (Signed)
Dr.Hopper please advise 

## 2011-06-28 NOTE — Telephone Encounter (Signed)
Patient called & stated his insurance now requires referrals Can you refer him to [facility] as he is due for his yearly check with them  &  A dermatologist for a bump he needs to have removed from his forehead, prefers [facility].  Patient ph# [phone]

## 2011-06-28 NOTE — Telephone Encounter (Signed)
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 Urology dx in Problem List

## 2011-08-30 ENCOUNTER — Other Ambulatory Visit: Payer: Self-pay | Admitting: Internal Medicine

## 2011-11-27 ENCOUNTER — Other Ambulatory Visit: Payer: Self-pay | Admitting: Internal Medicine

## 2011-12-25 ENCOUNTER — Other Ambulatory Visit: Payer: Self-pay | Admitting: Internal Medicine

## 2012-03-04 ENCOUNTER — Other Ambulatory Visit: Payer: Self-pay | Admitting: Internal Medicine

## 2012-07-24 ENCOUNTER — Ambulatory Visit (INDEPENDENT_AMBULATORY_CARE_PROVIDER_SITE_OTHER): Payer: Managed Care, Other (non HMO) | Admitting: Internal Medicine

## 2012-07-24 ENCOUNTER — Encounter: Payer: Self-pay | Admitting: Internal Medicine

## 2012-07-24 VITALS — BP 112/78 | HR 89 | Temp 98.0°F | Resp 12 | Ht 72.08 in | Wt 179.0 lb

## 2012-07-24 DIAGNOSIS — D126 Benign neoplasm of colon, unspecified: Secondary | ICD-10-CM

## 2012-07-24 DIAGNOSIS — R319 Hematuria, unspecified: Secondary | ICD-10-CM

## 2012-07-24 DIAGNOSIS — Z86010 Personal history of colonic polyps: Secondary | ICD-10-CM

## 2012-07-24 DIAGNOSIS — Z0000 Encounter for general adult medical examination without abnormal findings: Secondary | ICD-10-CM

## 2012-07-24 DIAGNOSIS — R55 Syncope and collapse: Secondary | ICD-10-CM

## 2012-07-24 DIAGNOSIS — E785 Hyperlipidemia, unspecified: Secondary | ICD-10-CM

## 2012-07-24 MED ORDER — FLUOXETINE HCL 10 MG PO TABS: 10.0000 mg | ORAL_TABLET | Freq: Every day | ORAL | Status: DC

## 2012-07-24 MED ORDER — VALSARTAN 160 MG PO TABS: 80.0000 mg | ORAL_TABLET | Freq: Every day | ORAL | Status: DC

## 2012-07-24 NOTE — Progress Notes (Signed)
  Subjective:    Patient ID: Steven Wolf, male    DOB: 1955/11/18, 57 y.o.   MRN: [id number]  HPI  He is here for a physical; he denies acute issues.     Review of Systems HYPERTENSION follow-up: Home blood pressure  average 112/74  Patient is compliant with medications  No adverse effects noted from medication  No regular exercise program   No specific dietary program , no added salt    No chest pain, palpitations, dyspnea, claudication,edema or paroxysmal nocturnal dyspnea described  No significant lightheadedness, headache, epistaxis, or syncope  His wife questions intermittent "snorting" without severe snoring or definite apnea. His sister does have sleep apnea. His father and paternal uncle had polycythemia. They were smokers.         Objective:   Physical Exam Gen.: Thin but healthy and well-nourished in appearance. Alert, appropriate and cooperative throughout exam.  Head: Normocephalic without obvious abnormalities;  pattern alopecia  Eyes: No corneal or conjunctival inflammation noted. Slight ptosis OD.  Extraocular motion intact. Vision grossly normal with lenses Ears: External  ear exam reveals no significant lesions or deformities. Canals clear .TMs normal. Hearing is grossly normal bilaterally. Nose: External nasal exam reveals no deformity or inflammation. Nasal mucosa are pink and moist. No lesions or exudates noted.  Mouth: Oral mucosa and oropharynx reveal no lesions or exudates. Teeth in good repair. Neck: No deformities, masses, or tenderness noted. Range of motion decreased. Thyroid normal. Lungs: Normal respiratory effort; chest expands symmetrically. Lungs are clear to auscultation without rales, wheezes, or increased work of breathing. Heart: Normal rate and rhythm. Normal S1 ;accentuated S2. No gallop, click, or rub. No murmur. Abdomen: Bowel sounds normal; abdomen soft and nontender. No masses, organomegaly or hernias noted. Genitalia: [facility] Musculoskeletal/extremities: No deformity or scoliosis noted of  the thoracic or lumbar spine.  No clubbing, cyanosis, edema, or significant extremity  deformity noted. Range of motion normal .Tone & strength  Normal. Joints normal . Nail health good. Able to lie down & sit up w/o help. Negative SLR bilaterally Vascular: Carotid, radial artery, dorsalis pedis and  posterior tibial pulses are full and equal. No bruits present. Neurologic: Alert and oriented x3. Deep tendon reflexes symmetrical and normal.      Skin: Intact without suspicious lesions or rashes. Lymph: No cervical, axillary lymphadenopathy present. Psych: Mood and affect are normal. Normally interactive                                                                                       Assessment & Plan:  #1 comprehensive physical exam; no acute findings  #2 possible snoring variant. He'll have his wife monitor her for persistence or progression of symptoms. The family history of sleep apnea and polycythemia; there should be a low threshold for pursuing sleep evaluation.  Plan: see Orders  & Recommendations

## 2012-07-24 NOTE — Patient Instructions (Addendum)
Preventive Health Care: Exercise at least 30-45 minutes a day,  3-4 days a week.  Eat a low-fat diet with lots of fruits and vegetables, up to 7-9 servings per day. This would eliminate the need for vitamin supplements. Avoid obesity; your goal is waist measurement < 40 inches.Consume less than 40 grams of sugar (preferably ZERO) per day from foods & drinks with High Fructose Corn Sugar as #1,2,3 or # 4 on label. Health Care Power of Attorney & Living Will. Complete these if not in place ; these place you in charge of your health care decisions. Please  schedule fasting Labs : BMET,Lipids, hepatic panel, CBC & dif, TSH.PLEASE BRING THESE INSTRUCTIONS TO FOLLOW UP  LAB APPOINTMENT.This will guarantee correct labs are drawn, eliminating need for repeat blood sampling ( needle sticks ! ). Diagnoses /Codes: V70.0.  If you activate the  My Chart system; lab & Xray results will be released directly  to you as soon as I review & address these through the computer. If you choose not to sign up for My Chart within 36 hours of labs being drawn; results will be reviewed & interpretation added before being copied & mailed, causing a delay in getting the results to you.If you do not receive that report within 7-10 days ,please call. Additionally you can use this system to gain direct  access to your records  if  out of town or @ an office of a  physician who is not in  the My Chart network.  This improves continuity of care & places you in control of your medical record.

## 2012-07-27 ENCOUNTER — Other Ambulatory Visit (INDEPENDENT_AMBULATORY_CARE_PROVIDER_SITE_OTHER): Payer: Managed Care, Other (non HMO)

## 2012-07-27 DIAGNOSIS — Z0000 Encounter for general adult medical examination without abnormal findings: Secondary | ICD-10-CM

## 2012-07-27 LAB — CBC WITH DIFFERENTIAL/PLATELET
Eosinophils Relative: 2.3 % (ref 0.0–5.0)
HCT: 45 % (ref 39.0–52.0)
Hemoglobin: 15.4 g/dL (ref 13.0–17.0)
Lymphs Abs: 1.7 10*3/uL (ref 0.7–4.0)
MCV: 90.9 fl (ref 78.0–100.0)
Monocytes Absolute: 0.5 10*3/uL (ref 0.1–1.0)
Monocytes Relative: 10.2 % (ref 3.0–12.0)
Neutro Abs: 2.2 10*3/uL (ref 1.4–7.7)
Platelets: 195 10*3/uL (ref 150.0–400.0)
RDW: 12.6 % (ref 11.5–14.6)
WBC: 4.6 10*3/uL (ref 4.5–10.5)

## 2012-07-27 LAB — LIPID PANEL
LDL Cholesterol: 113 mg/dL — ABNORMAL HIGH (ref 0–99)
Total CHOL/HDL Ratio: 4
Triglycerides: 86 mg/dL (ref 0.0–149.0)

## 2012-07-27 LAB — TSH: TSH: 3.05 u[IU]/mL (ref 0.35–5.50)

## 2012-07-27 LAB — HEPATIC FUNCTION PANEL
AST: 37 U/L (ref 0–37)
Albumin: 4.1 g/dL (ref 3.5–5.2)
Alkaline Phosphatase: 70 U/L (ref 39–117)
Total Bilirubin: 0.8 mg/dL (ref 0.3–1.2)

## 2012-07-27 LAB — BASIC METABOLIC PANEL
CO2: 27 mEq/L (ref 19–32)
Calcium: 8.9 mg/dL (ref 8.4–10.5)
Chloride: 105 mEq/L (ref 96–112)
Creatinine, Ser: 1.2 mg/dL (ref 0.4–1.5)
Glucose, Bld: 89 mg/dL (ref 70–99)

## 2012-07-28 ENCOUNTER — Encounter: Payer: Self-pay | Admitting: Internal Medicine

## 2012-07-29 ENCOUNTER — Encounter: Payer: Self-pay | Admitting: General Practice

## 2012-07-29 NOTE — Telephone Encounter (Signed)
Chart updated

## 2013-01-28 ENCOUNTER — Other Ambulatory Visit: Payer: Self-pay

## 2013-02-13 ENCOUNTER — Other Ambulatory Visit: Payer: Self-pay | Admitting: Internal Medicine

## 2013-02-14 NOTE — Telephone Encounter (Signed)
Fluoxetine refilled per protocol

## 2013-06-17 ENCOUNTER — Other Ambulatory Visit: Payer: Self-pay | Admitting: Internal Medicine

## 2013-06-18 NOTE — Telephone Encounter (Signed)
Rx sent to the pharmacy by e-script.  Pt needs complete physical.//AB/CMA 

## 2013-08-17 ENCOUNTER — Other Ambulatory Visit: Payer: Self-pay

## 2013-08-17 MED ORDER — VALSARTAN 160 MG PO TABS: 80.0000 mg | ORAL_TABLET | Freq: Every day | ORAL | Status: DC

## 2013-08-27 ENCOUNTER — Other Ambulatory Visit: Payer: Self-pay | Admitting: Internal Medicine

## 2013-08-27 NOTE — Telephone Encounter (Signed)
90

## 2013-08-27 NOTE — Telephone Encounter (Signed)
Last ov 07/24/2012 Med last filled 06/18/13 #30 plus 1

## 2013-09-03 ENCOUNTER — Encounter (HOSPITAL_COMMUNITY): Payer: Self-pay | Admitting: Emergency Medicine

## 2013-09-03 ENCOUNTER — Emergency Department (HOSPITAL_COMMUNITY): Payer: Managed Care, Other (non HMO)

## 2013-09-03 ENCOUNTER — Emergency Department (HOSPITAL_COMMUNITY)
Admission: EM | Admit: 2013-09-03 | Discharge: 2013-09-03 | Disposition: A | Discharge status: Home / Self Care | Payer: Managed Care, Other (non HMO) | Attending: Emergency Medicine | Admitting: Emergency Medicine

## 2013-09-03 DIAGNOSIS — Z8639 Personal history of other endocrine, nutritional and metabolic disease: Secondary | ICD-10-CM | POA: Insufficient documentation

## 2013-09-03 DIAGNOSIS — M25519 Pain in unspecified shoulder: Secondary | ICD-10-CM | POA: Insufficient documentation

## 2013-09-03 DIAGNOSIS — Z87448 Personal history of other diseases of urinary system: Secondary | ICD-10-CM | POA: Insufficient documentation

## 2013-09-03 DIAGNOSIS — Z8719 Personal history of other diseases of the digestive system: Secondary | ICD-10-CM | POA: Insufficient documentation

## 2013-09-03 DIAGNOSIS — R232 Flushing: Secondary | ICD-10-CM | POA: Insufficient documentation

## 2013-09-03 DIAGNOSIS — F411 Generalized anxiety disorder: Secondary | ICD-10-CM | POA: Insufficient documentation

## 2013-09-03 DIAGNOSIS — I10 Essential (primary) hypertension: Secondary | ICD-10-CM | POA: Insufficient documentation

## 2013-09-03 DIAGNOSIS — Z880 Allergy status to penicillin: Secondary | ICD-10-CM | POA: Insufficient documentation

## 2013-09-03 DIAGNOSIS — Z79899 Other long term (current) drug therapy: Secondary | ICD-10-CM | POA: Insufficient documentation

## 2013-09-03 DIAGNOSIS — Z862 Personal history of diseases of the blood and blood-forming organs and certain disorders involving the immune mechanism: Secondary | ICD-10-CM | POA: Insufficient documentation

## 2013-09-03 DIAGNOSIS — Z7982 Long term (current) use of aspirin: Secondary | ICD-10-CM | POA: Insufficient documentation

## 2013-09-03 HISTORY — DX: Anxiety disorder, unspecified: F41.9

## 2013-09-03 LAB — CBC
HCT: 44.4 % (ref 39.0–52.0)
Hemoglobin: 15.2 g/dL (ref 13.0–17.0)
MCH: 30.7 pg (ref 26.0–34.0)
MCHC: 34.2 g/dL (ref 30.0–36.0)
MCV: 89.7 fL (ref 78.0–100.0)
Platelets: 179 10*3/uL (ref 150–400)
RBC: 4.95 MIL/uL (ref 4.22–5.81)
RDW: 12.7 % (ref 11.5–15.5)
WBC: 5 10*3/uL (ref 4.0–10.5)

## 2013-09-03 LAB — BASIC METABOLIC PANEL
BUN: 19 mg/dL (ref 6–23)
CO2: 26 mEq/L (ref 19–32)
Calcium: 9.4 mg/dL (ref 8.4–10.5)
Chloride: 101 mEq/L (ref 96–112)
Creatinine, Ser: 1.11 mg/dL (ref 0.50–1.35)
GFR calc non Af Amer: 70 mL/min — ABNORMAL LOW (ref >90)
GFR, EST AFRICAN AMERICAN: 82 mL/min — AB (ref >90)
Glucose, Bld: 104 mg/dL — ABNORMAL HIGH (ref 70–99)
POTASSIUM: 4.1 meq/L (ref 3.7–5.3)
SODIUM: 140 meq/L (ref 137–147)

## 2013-09-03 LAB — PRO B NATRIURETIC PEPTIDE: Pro B Natriuretic peptide (BNP): 15.1 pg/mL (ref 0–125)

## 2013-09-03 LAB — TROPONIN I

## 2013-09-03 MED ORDER — IBUPROFEN 400 MG PO TABS: 400.0000 mg | ORAL_TABLET | Freq: Four times a day (QID) | ORAL | Status: DC | PRN

## 2013-09-03 NOTE — ED Provider Notes (Signed)
CSN: [id number]     Arrival date & time 09/03/13  0256 History   First MD Initiated Contact with Patient 09/03/13 0415     Chief Complaint  Patient presents with  . Chest Pain     (Consider location/radiation/quality/duration/timing/severity/associated sxs/prior Treatment) HPI Patient states he woke up this morning around 1:30 to go to the bathroom and began having left shoulder pain. He then noted a flushing sensation on the left side of his chest. This lasted just a few minutes and dissipated. He still has mild left shoulder pain. It is worse with movement and palpation. He has no lower extremity swelling or edema. Patient has no past medical history of coronary artery disease. He denies any chest symptoms at this time. At no point did he have any shortness of breath. He's had no fever or chills. Past Medical History  Diagnosis Date  . Hypertension   . Syncope 02/2007    NOCTURNAL POST MICTURTION  . Benign prostatic hypertrophy   . Diverticulosis of colon   . Microscopic hematuria   . Hyperlipidemia   . Nephrolithiasis      X 2; Dr Serita Butcher  . Gilbert syndrome   . Anxiety    Past Surgical History  Procedure Laterality Date  . Liver biopsy      X 1 for elevated LFTs with NEGATIVE  hepatitis serologies  . Appendectomy    . Colonoscopy w/ polypectomy  2002    Tics 2005 & 2010; due 2020, Dr Sharlett Iles  . Tonsillectomy     Family History  Problem Relation Age of Onset  . Breast cancer Mother   . Rheumatologic disease Mother     RA  . Heart disease Father     PCV;MI ? 60  . Polycythemia Father   . Leukemia Paternal Uncle   . Polycythemia Paternal Uncle   . Stroke Maternal Grandfather   . Heart attack Paternal Grandfather     > 55  . Stroke Maternal Grandmother     . 65  . Diabetes Neg Hx   . Sleep apnea Sister    History  Substance Use Topics  . Smoking status: Never Smoker   . Smokeless tobacco: Not on file     Comment: Second hand smoke  . Alcohol Use: No     Review of Systems  Constitutional: Negative for fever and chills.  Respiratory: Negative for cough and shortness of breath.   Cardiovascular: Negative for chest pain.  Gastrointestinal: Negative for nausea, vomiting, abdominal pain, diarrhea and constipation.  Musculoskeletal: Positive for arthralgias. Negative for back pain, myalgias, neck pain and neck stiffness.  Skin: Negative for rash and wound.  Neurological: Negative for dizziness, weakness, light-headedness, numbness and headaches.  Psychiatric/Behavioral: The patient is nervous/anxious.   All other systems reviewed and are negative.     Allergies  Ramipril and Penicillins  Home Medications   Prior to Admission medications   Medication Sig Start Date End Date Taking? Authorizing Provider  aspirin 81 MG tablet Take 81 mg by mouth daily.     Yes Historical Provider, MD  Cholecalciferol (VITAMIN D) 2000 UNITS CAPS Take 2,000 Units by mouth daily.    Yes Historical Provider, MD  Coenzyme Q10 (COQ10) 100 MG CAPS Take 1 tablet by mouth daily.    Yes Historical Provider, MD  fish oil-omega-3 fatty acids 1000 MG capsule Take 1 g by mouth 2 (two) times daily.    Yes Historical Provider, MD  FLUoxetine (PROZAC) 10 MG tablet Take 10  mg by mouth daily.   Yes Historical Provider, MD  OMEGA-3 KRILL OIL PO Take 1 capsule by mouth daily.   Yes Historical Provider, MD  valsartan (DIOVAN) 160 MG tablet Take 80 mg by mouth daily.   Yes Historical Provider, MD   BP 117/88  Pulse 67  Temp(Src) 98.4 F (36.9 C) (Oral)  Resp 20  SpO2 94% Physical Exam  Nursing note and vitals reviewed. Constitutional: He is oriented to person, place, and time. He appears well-developed and well-nourished. No distress.  HENT:  Head: Normocephalic and atraumatic.  Mouth/Throat: Oropharynx is clear and moist.  Eyes: EOM are normal. Pupils are equal, round, and reactive to light.  Neck: Normal range of motion. Neck supple.  Cardiovascular: Normal rate  and regular rhythm.  Exam reveals no gallop and no friction rub.   No murmur heard. Pulmonary/Chest: Effort normal and breath sounds normal. No respiratory distress. He has no wheezes. He has no rales. He exhibits no tenderness.  Abdominal: Soft. Bowel sounds are normal. He exhibits no distension and no mass. There is no tenderness. There is no rebound and no guarding.  Musculoskeletal: Normal range of motion. He exhibits no edema and no tenderness.  No calf swelling or edema. Patient has mild tenderness to palpation over the anterior superior aspects of the left shoulder. No crepitance or deformity. Distal pulses intact.  Neurological: He is alert and oriented to person, place, and time.  5/5 motor in all extremities. Sensation intact.  Skin: Skin is warm and dry. No rash noted. No erythema.  Psychiatric:  Mildly anxious    ED Course  Procedures (including critical care time) Labs Review Labs Reviewed  BASIC METABOLIC PANEL - Abnormal; Notable for the following:    Glucose, Bld 104 (*)    GFR calc non Af Amer 70 (*)    GFR calc Af Amer 82 (*)    All other components within normal limits  CBC  PRO B NATRIURETIC PEPTIDE  TROPONIN I    Imaging Review Dg Chest 2 View  09/03/2013   CLINICAL DATA:  Left-sided chest pain. Left flank pain. Left shoulder pain. Right arm aches.  EXAM: CHEST  2 VIEW  COMPARISON:  None.  FINDINGS: The heart size and mediastinal contours are within normal limits. Both lungs are clear. The visualized skeletal structures are unremarkable.  IMPRESSION: No active cardiopulmonary disease.   Electronically Signed   By: Lucienne Capers M.D.   On: 09/03/2013 03:52     EKG Interpretation   Date/Time:  Friday September 03 2013 03:03:25 EDT Ventricular Rate:  84 PR Interval:  148 QRS Duration: 98 QT Interval:  368 QTC Calculation: 434 R Axis:   10 Text Interpretation:  Normal sinus rhythm Normal ECG Confirmed by  Lita Mains  MD, Gerhardt Gleed ([id number]) on 09/03/2013 4:17:28 AM       MDM   Final diagnoses:  None    Very atypical symptoms for coronary artery disease. Shoulder pains appear to be musculoskeletal. Initial troponin and EKG are within normal limits. The patient's been significantly screened for emergent medical condition and can be safely discharged home. Return precautions have been given and patient voiced understanding.    Julianne Rice, MD 09/03/13 [phone]

## 2013-09-03 NOTE — ED Notes (Signed)
te pt woke up this am with a  Strange sensation in his lt chest his lt also.  Earlier tonight before he went to bed he has some rt arm pain or strange feeling there.  No discomfort at present..  No previous history

## 2013-09-03 NOTE — Discharge Instructions (Signed)
Arthralgia  Your caregiver has diagnosed you as suffering from an arthralgia. Arthralgia means there is pain in a joint. This can come from many reasons including:  · Bruising the joint which causes soreness (inflammation) in the joint.  · Wear and tear on the joints which occur as we grow older (osteoarthritis).  · Overusing the joint.  · Various forms of arthritis.  · Infections of the joint.  Regardless of the cause of pain in your joint, most of these different pains respond to anti-inflammatory drugs and rest. The exception to this is when a joint is infected, and these cases are treated with antibiotics, if it is a bacterial infection.  HOME CARE INSTRUCTIONS   · Rest the injured area for as long as directed by your caregiver. Then slowly start using the joint as directed by your caregiver and as the pain allows. Crutches as directed may be useful if the ankles, knees or hips are involved. If the knee was splinted or casted, continue use and care as directed. If an stretchy or elastic wrapping bandage has been applied today, it should be removed and re-applied every 3 to 4 hours. It should not be applied tightly, but firmly enough to keep swelling down. Watch toes and feet for swelling, bluish discoloration, coldness, numbness or excessive pain. If any of these problems (symptoms) occur, remove the ace bandage and re-apply more loosely. If these symptoms persist, contact your caregiver or return to this location.  · For the first 24 hours, keep the injured extremity elevated on pillows while lying down.  · Apply ice for 15-20 minutes to the sore joint every couple hours while awake for the first half day. Then 03-04 times per day for the first 48 hours. Put the ice in a plastic bag and place a towel between the bag of ice and your skin.  · Wear any splinting, casting, elastic bandage applications, or slings as instructed.  · Only take over-the-counter or prescription medicines for pain, discomfort, or fever as  directed by your caregiver. Do not use aspirin immediately after the injury unless instructed by your physician. Aspirin can cause increased bleeding and bruising of the tissues.  · If you were given crutches, continue to use them as instructed and do not resume weight bearing on the sore joint until instructed.  Persistent pain and inability to use the sore joint as directed for more than 2 to 3 days are warning signs indicating that you should see a caregiver for a follow-up visit as soon as possible. Initially, a hairline fracture (break in bone) may not be evident on X-rays. Persistent pain and swelling indicate that further evaluation, non-weight bearing or use of the joint (use of crutches or slings as instructed), or further X-rays are indicated. X-rays may sometimes not show a small fracture until a week or 10 days later. Make a follow-up appointment with your own caregiver or one to whom we have referred you. A radiologist (specialist in reading X-rays) may read your X-rays. Make sure you know how you are to obtain your X-ray results. Do not assume everything is normal if you do not hear from us.  SEEK MEDICAL CARE IF:  Bruising, swelling, or pain increases.  SEEK IMMEDIATE MEDICAL CARE IF:   · Your fingers or toes are numb or blue.  · The pain is not responding to medications and continues to stay the same or get worse.  · The pain in your joint becomes severe.  · You   develop a fever over 102° F (38.9° C).  · It becomes impossible to move or use the joint.  MAKE SURE YOU:   · Understand these instructions.  · Will watch your condition.  · Will get help right away if you are not doing well or get worse.  Document Released: 03/11/2005 Document Revised: 06/03/2011 Document Reviewed: 10/28/2007  [employer]® Patient Information ©2014 [employer].

## 2013-11-11 ENCOUNTER — Other Ambulatory Visit (INDEPENDENT_AMBULATORY_CARE_PROVIDER_SITE_OTHER): Payer: Managed Care, Other (non HMO)

## 2013-11-11 ENCOUNTER — Encounter: Payer: Self-pay | Admitting: Internal Medicine

## 2013-11-11 ENCOUNTER — Ambulatory Visit (INDEPENDENT_AMBULATORY_CARE_PROVIDER_SITE_OTHER): Payer: Managed Care, Other (non HMO) | Admitting: Internal Medicine

## 2013-11-11 VITALS — BP 120/90 | HR 73 | Temp 98.2°F | Ht 72.0 in | Wt 178.4 lb

## 2013-11-11 DIAGNOSIS — E119 Type 2 diabetes mellitus without complications: Secondary | ICD-10-CM | POA: Insufficient documentation

## 2013-11-11 DIAGNOSIS — Z0000 Encounter for general adult medical examination without abnormal findings: Secondary | ICD-10-CM

## 2013-11-11 DIAGNOSIS — E785 Hyperlipidemia, unspecified: Secondary | ICD-10-CM

## 2013-11-11 DIAGNOSIS — R7309 Other abnormal glucose: Secondary | ICD-10-CM

## 2013-11-11 DIAGNOSIS — I10 Essential (primary) hypertension: Secondary | ICD-10-CM

## 2013-11-11 DIAGNOSIS — R7303 Prediabetes: Secondary | ICD-10-CM | POA: Insufficient documentation

## 2013-11-11 LAB — HEPATIC FUNCTION PANEL
ALBUMIN: 4.4 g/dL (ref 3.5–5.2)
ALT: 39 U/L (ref 0–53)
AST: 31 U/L (ref 0–37)
Alkaline Phosphatase: 70 U/L (ref 39–117)
BILIRUBIN TOTAL: 1.1 mg/dL (ref 0.2–1.2)
Bilirubin, Direct: 0.1 mg/dL (ref 0.0–0.3)
Total Protein: 7.3 g/dL (ref 6.0–8.3)

## 2013-11-11 LAB — LIPID PANEL
CHOLESTEROL: 178 mg/dL (ref 0–200)
HDL: 43.5 mg/dL (ref >39.00)
LDL CALC: 116 mg/dL — AB (ref 0–99)
NonHDL: 134.5
Total CHOL/HDL Ratio: 4
Triglycerides: 92 mg/dL (ref 0.0–149.0)
VLDL: 18.4 mg/dL (ref 0.0–40.0)

## 2013-11-11 LAB — TSH: TSH: 2.92 u[IU]/mL (ref 0.35–4.50)

## 2013-11-11 LAB — HEMOGLOBIN A1C: HEMOGLOBIN A1C: 5.5 % (ref 4.6–6.5)

## 2013-11-11 MED ORDER — VALSARTAN 80 MG PO TABS: 80.0000 mg | ORAL_TABLET | Freq: Every day | ORAL | Status: DC

## 2013-11-11 NOTE — Progress Notes (Signed)
   Subjective:    Patient ID: Steven Wolf, male    DOB: [birth year], 58 y.o.   MRN: [id number]  HPI He is here for a physical;acute issues denied.  He is on a heart healthy low-salt diet. He walks 5 miles per day most days and has no associated symptoms.  He is compliant with his blood pressure medicines. Blood pressure average is 115/75.  Seen @ [place] 09/03/13 for chest wall pain; labs & EKG  reviewed.      Review of Systems  Palpitations, tachycardia, exertional dyspnea, paroxysmal nocturnal dyspnea, claudication or edema are absent.  He has slight tinnitus on the left which he notes in quiet rooms.          Objective:   Physical Exam Gen.: Healthy and well-nourished in appearance. Alert, appropriate and cooperative throughout exam. Appears younger than stated age  Head: Normocephalic without obvious abnormalities; pattern alopecia  Eyes: No corneal or conjunctival inflammation noted. Pupils equal round reactive to light and accommodation. Extraocular motion intact.  Ears: External  ear exam reveals no significant lesions or deformities. Canals clear .TMs normal. Hearing is grossly normal bilaterally. Nose: External nasal exam reveals no deformity or inflammation. Nasal mucosa are pink and moist. No lesions or exudates noted.   Mouth: Oral mucosa and oropharynx reveal no lesions or exudates. Teeth in good repair. Neck: No deformities, masses, or tenderness noted. Range of motion & Thyroid normal. Lungs: Normal respiratory effort; chest expands symmetrically. Lungs are clear to auscultation without rales, wheezes, or increased work of breathing. Heart: Normal rate and rhythm. Normal S1 and S2. No gallop, click, or rub. No murmur. Abdomen: Bowel sounds normal; abdomen soft and nontender. No masses, organomegaly or hernias noted. Genitalia: as per Urology               Musculoskeletal/extremities: No deformity or scoliosis noted of  the thoracic or lumbar spine.  No  clubbing, cyanosis, edema, or significant extremity  deformity noted. Range of motion normal .Tone & strength normal. Hand joints normal.  Fingernail health good. Able to lie down & sit up w/o help. Negative SLR bilaterally Vascular: Carotid, radial artery, dorsalis pedis and  posterior tibial pulses are full and equal. No bruits present. Neurologic: Alert and oriented x3. Deep tendon reflexes symmetrical and normal.  Gait normal.       Skin: Intact without suspicious lesions or rashes. Lymph: No cervical, axillary lymphadenopathy present. Psych: Mood and affect are normal. Normally interactive                                                                                        Assessment & Plan:  #1 comprehensive physical exam; no acute findings  Plan: see Orders  & Recommendations

## 2013-11-11 NOTE — Progress Notes (Signed)
Pre visit review using our clinic review tool, if applicable. No additional management support is needed unless otherwise documented below in the visit note. 

## 2013-11-11 NOTE — Patient Instructions (Signed)
Minimal Blood Pressure Goal= AVERAGE < 140/90;  Ideal is an AVERAGE < 135/85. This AVERAGE should be calculated from @ least 5-7 BP readings taken @ different times of day on different days of week. You should not respond to isolated BP readings , but rather the AVERAGE for that week .Please bring your  blood pressure cuff to office visits to verify that it is reliable.It  can also be checked against the blood pressure device at the pharmacy. Finger or wrist cuffs are not dependable; an arm cuff is.  Your next office appointment will be determined based upon review of your pending labs . Those instructions will be transmitted to you through My Chart .

## 2013-12-13 ENCOUNTER — Other Ambulatory Visit: Payer: Self-pay

## 2013-12-13 MED ORDER — FLUOXETINE HCL 10 MG PO TABS: 10.0000 mg | ORAL_TABLET | Freq: Every day | ORAL | Status: DC

## 2014-03-13 ENCOUNTER — Other Ambulatory Visit: Payer: Self-pay | Admitting: Internal Medicine

## 2014-03-14 NOTE — Telephone Encounter (Signed)
OK X 3 mos 

## 2014-06-17 ENCOUNTER — Other Ambulatory Visit: Payer: Self-pay | Admitting: Internal Medicine

## 2014-06-20 NOTE — Telephone Encounter (Signed)
OK X 3 mos 

## 2014-09-04 ENCOUNTER — Other Ambulatory Visit: Payer: Self-pay | Admitting: Internal Medicine

## 2014-09-05 ENCOUNTER — Other Ambulatory Visit: Payer: Self-pay | Admitting: Emergency Medicine

## 2014-09-05 MED ORDER — FLUOXETINE HCL 10 MG PO TABS: 10.0000 mg | ORAL_TABLET | Freq: Every day | ORAL | Status: DC

## 2014-09-05 NOTE — Telephone Encounter (Signed)
Last OV 10/2013. Please advise on Prozac refill

## 2014-09-05 NOTE — Telephone Encounter (Signed)
OK X 3 mos 

## 2014-09-11 ENCOUNTER — Other Ambulatory Visit: Payer: Self-pay | Admitting: Internal Medicine

## 2014-09-12 NOTE — Telephone Encounter (Signed)
OK X 3 mos  Needs CPX in 8/16

## 2014-09-12 NOTE — Telephone Encounter (Signed)
Patients last office visit [id number]---MLVXBO advise, thanks

## 2014-09-13 NOTE — Telephone Encounter (Signed)
Sent refills...Steven Wolf

## 2014-12-20 ENCOUNTER — Other Ambulatory Visit: Payer: Self-pay | Admitting: Emergency Medicine

## 2014-12-20 DIAGNOSIS — I10 Essential (primary) hypertension: Secondary | ICD-10-CM

## 2014-12-20 MED ORDER — VALSARTAN 80 MG PO TABS: 80.0000 mg | ORAL_TABLET | Freq: Every day | ORAL | Status: DC

## 2014-12-22 ENCOUNTER — Telehealth: Payer: Self-pay | Admitting: *Deleted

## 2014-12-22 MED ORDER — FLUOXETINE HCL 10 MG PO TABS: 10.0000 mg | ORAL_TABLET | Freq: Every day | ORAL | Status: DC

## 2014-12-22 NOTE — Telephone Encounter (Signed)
Receive call wife states husband is needing refill on his fluoxetine. He has made cps appt for 01/06/15, but he is currently out and needing refill. Inform wife can send 30 day to local pharmacy until appt. Rx sent to [facility].../lmb

## 2014-12-29 ENCOUNTER — Encounter: Payer: Self-pay | Admitting: Gastroenterology

## 2015-01-06 ENCOUNTER — Encounter: Payer: Self-pay | Admitting: Internal Medicine

## 2015-01-06 ENCOUNTER — Other Ambulatory Visit (INDEPENDENT_AMBULATORY_CARE_PROVIDER_SITE_OTHER): Payer: Managed Care, Other (non HMO)

## 2015-01-06 ENCOUNTER — Ambulatory Visit (INDEPENDENT_AMBULATORY_CARE_PROVIDER_SITE_OTHER): Payer: Managed Care, Other (non HMO) | Admitting: Internal Medicine

## 2015-01-06 VITALS — BP 130/80 | HR 70 | Temp 97.9°F | Resp 14 | Ht 72.0 in | Wt 181.0 lb

## 2015-01-06 DIAGNOSIS — Z0000 Encounter for general adult medical examination without abnormal findings: Secondary | ICD-10-CM

## 2015-01-06 DIAGNOSIS — I10 Essential (primary) hypertension: Secondary | ICD-10-CM

## 2015-01-06 DIAGNOSIS — Z9189 Other specified personal risk factors, not elsewhere classified: Secondary | ICD-10-CM

## 2015-01-06 DIAGNOSIS — Z0189 Encounter for other specified special examinations: Secondary | ICD-10-CM

## 2015-01-06 LAB — BASIC METABOLIC PANEL
BUN: 19 mg/dL (ref 6–23)
CHLORIDE: 104 meq/L (ref 96–112)
CO2: 30 mEq/L (ref 19–32)
Calcium: 9.8 mg/dL (ref 8.4–10.5)
Creatinine, Ser: 1.19 mg/dL (ref 0.40–1.50)
GFR: 65.96 mL/min (ref >60.00)
Glucose, Bld: 95 mg/dL (ref 70–99)
POTASSIUM: 4.8 meq/L (ref 3.5–5.1)
SODIUM: 141 meq/L (ref 135–145)

## 2015-01-06 LAB — CBC WITH DIFFERENTIAL/PLATELET
Basophils Absolute: 0 10*3/uL (ref 0.0–0.1)
Basophils Relative: 1 % (ref 0.0–3.0)
EOS PCT: 2 % (ref 0.0–5.0)
Eosinophils Absolute: 0.1 10*3/uL (ref 0.0–0.7)
HEMATOCRIT: 48.2 % (ref 39.0–52.0)
HEMOGLOBIN: 16.6 g/dL (ref 13.0–17.0)
LYMPHS PCT: 36.6 % (ref 12.0–46.0)
Lymphs Abs: 1.8 10*3/uL (ref 0.7–4.0)
MCHC: 34.4 g/dL (ref 30.0–36.0)
MCV: 90 fl (ref 78.0–100.0)
Monocytes Absolute: 0.5 10*3/uL (ref 0.1–1.0)
Monocytes Relative: 9.9 % (ref 3.0–12.0)
Neutro Abs: 2.5 10*3/uL (ref 1.4–7.7)
Neutrophils Relative %: 50.5 % (ref 43.0–77.0)
Platelets: 210 10*3/uL (ref 150.0–400.0)
RBC: 5.35 Mil/uL (ref 4.22–5.81)
RDW: 12.8 % (ref 11.5–15.5)
WBC: 4.9 10*3/uL (ref 4.0–10.5)

## 2015-01-06 LAB — HEPATIC FUNCTION PANEL
ALT: 38 U/L (ref 0–53)
AST: 28 U/L (ref 0–37)
Albumin: 4.6 g/dL (ref 3.5–5.2)
Alkaline Phosphatase: 77 U/L (ref 39–117)
BILIRUBIN DIRECT: 0.2 mg/dL (ref 0.0–0.3)
TOTAL PROTEIN: 7.6 g/dL (ref 6.0–8.3)
Total Bilirubin: 0.9 mg/dL (ref 0.2–1.2)

## 2015-01-06 LAB — LIPID PANEL
CHOL/HDL RATIO: 4
CHOLESTEROL: 174 mg/dL (ref 0–200)
HDL: 42.7 mg/dL (ref >39.00)
LDL CALC: 106 mg/dL — AB (ref 0–99)
NonHDL: 131.08
TRIGLYCERIDES: 125 mg/dL (ref 0.0–149.0)
VLDL: 25 mg/dL (ref 0.0–40.0)

## 2015-01-06 LAB — TSH: TSH: 3.09 u[IU]/mL (ref 0.35–4.50)

## 2015-01-06 MED ORDER — VALSARTAN 80 MG PO TABS: 80.0000 mg | ORAL_TABLET | Freq: Every day | ORAL | Status: DC

## 2015-01-06 MED ORDER — FLUOXETINE HCL 10 MG PO TABS: 10.0000 mg | ORAL_TABLET | Freq: Every day | ORAL | Status: DC

## 2015-01-06 NOTE — Progress Notes (Signed)
Pre visit review using our clinic review tool, if applicable. No additional management support is needed unless otherwise documented below in the visit note. 

## 2015-01-06 NOTE — Progress Notes (Signed)
   Subjective:    Patient ID: Steven Wolf, male    DOB: 1955-09-05, 59 y.o.   MRN: [id number]  HPI The patient is here for a physical to assess status of active health conditions.  PMH, FH, & Social History reviewed & updated.No change in [place] as recorded.  He is on a heart healthy diet. He walks four-5 miles per day 5 days a week without cardiopulmonary symptoms. He's been compliant with his medicines without adverse effects. Blood pressure averages 115/75 at home.  The fluoxetine is of benefit in controlling anxiety. He's had no panic attacks. Appetite is good and he sleeps well. He denies any depression.  He did have a polyp in 2002; follow-up colonoscopy  2005 & 2010 were negative for polyps. He is due for repeat in 2020. He does have occasional hemorrhoidal bleeding but no other GI symptoms  He describes nocturia once nightly  His wife describes him exhibiting a "occasional snort". There is no definite severe snoring or apnea. His sister does have sleep apnea.  Review of Systems  Chest pain, palpitations, tachycardia, exertional dyspnea, paroxysmal nocturnal dyspnea, claudication or edema are absent. No unexplained weight loss, abdominal pain, significant dyspepsia, dysphagia, melena, or persistently small caliber stools. Dysuria, pyuria, hematuria, frequency, or polyuria are denied. Change in hair, skin, nails denied. No bowel changes of constipation or diarrhea. No intolerance to heat or cold.     Objective:   Physical Exam Pertinent or positive findings include: Pattern alopecia is present. He has minor crepitus of the knees. Genitourinary exam was deferred to his Urologist.  General appearance :adequately nourished; in no distress.  Eyes: No conjunctival inflammation or scleral icterus is present.  Oral exam:  Lips and gums are healthy appearing.There is no oropharyngeal erythema or exudate noted. Dental hygiene is good.  Heart:  Normal rate and regular rhythm. S1 and  S2 normal without gallop, murmur, click, rub or other extra sounds    Lungs:Chest clear to auscultation; no wheezes, rhonchi,rales ,or rubs present.No increased work of breathing.   Abdomen: bowel sounds normal, soft and non-tender without masses, organomegaly or hernias noted.  No guarding or rebound. No flank tenderness to percussion.  Vascular : all pulses equal ; no bruits present.  Skin:Warm & dry.  Intact without suspicious lesions or rashes ; no tenting or jaundice   Lymphatic: No lymphadenopathy is noted about the head, neck, axilla.   Neuro: Strength, tone & DTRs normal.     Assessment & Plan:  #1 comprehensive physical exam; no acute findings  #2 possible sleep apnea  Plan: see Orders  & Recommendations

## 2015-01-06 NOTE — Patient Instructions (Signed)
The Sleep Apnea evaluation referral will be scheduled and you'll be notified of the time.Please call the Referral Co-Ordinator @ [phone] if you have not been notified of appointment time within 7-10 days. Your next office appointment will be determined based upon review of your pending labs .  Those written interpretation of the lab results and instructions will be transmitted to you by My Chart   Critical results will be called.   Followup as needed for any active or acute issue. Please report any significant change in your symptoms.

## 2015-04-04 ENCOUNTER — Ambulatory Visit (INDEPENDENT_AMBULATORY_CARE_PROVIDER_SITE_OTHER): Payer: 59 | Admitting: Pulmonary Disease

## 2015-04-04 ENCOUNTER — Encounter: Payer: Self-pay | Admitting: Pulmonary Disease

## 2015-04-04 VITALS — BP 122/88 | HR 79 | Ht 72.0 in | Wt 184.8 lb

## 2015-04-04 DIAGNOSIS — G4733 Obstructive sleep apnea (adult) (pediatric): Secondary | ICD-10-CM | POA: Diagnosis not present

## 2015-04-04 DIAGNOSIS — R0683 Snoring: Secondary | ICD-10-CM | POA: Diagnosis not present

## 2015-04-04 NOTE — Patient Instructions (Addendum)
Home sleep study Nasal steroid spray (eg nasonex ) can be used during spring / fall for snoring

## 2015-04-04 NOTE — Assessment & Plan Note (Signed)
Pretest probability for OSA appears to be low. Very likely he is a simple snorer. Since he does not have significant comorbidities, it would be reasonable to proceed with a Home sleep study Nasal steroid spray (eg nasonex ) can be used during spring / fall for snoring   The pathophysiology of obstructive sleep apnea , it's cardiovascular consequences & modes of treatment including CPAP were discused with the patient in detail & they evidenced understanding.

## 2015-04-04 NOTE — Progress Notes (Signed)
Subjective:    Patient ID: Steven Wolf, male    DOB: 09-09-1955, 60 y.o.   MRN: [id number]  HPI  60 year old man presents for evaluation of sleep-disordered breathing. His wife has noted that he periodically gasps and snorts and asleep. She feels that this is worse when he lies supine and especially in the spring and fall. He reports sinus drainage during these occasions. He feels an extra pillow has helped decrease his snoring. His sister uses a CPAP machine Epworth sleepiness score is 6 Bedtime is between 10 and 11 PM, sleep latency between 15 and 30 minutes, he sleeps on his back with 2 pillows, reports 2-3 nocturnal awakenings and is out of bed by 6 AM feeling rested without dryness of mouth or headaches. On weekends he stays in bed until 8:30 AM. His weight has fluctuated within 5 pounds over the last few years. He can nap up to an hour on weekends and feels rested on waking up  He has hypertension well controlled with one medication There is no history suggestive of cataplexy, sleep paralysis or parasomnias    Past Medical History  Diagnosis Date  . Hypertension   . Syncope 02/2007    NOCTURNAL POST MICTURTION  . Benign prostatic hypertrophy   . Diverticulosis of colon   . Microscopic hematuria   . Hyperlipidemia   . Nephrolithiasis      X 2; Dr Serita Butcher  . Gilbert syndrome   . Anxiety     Past Surgical History  Procedure Laterality Date  . Liver biopsy      X 1 for elevated LFTs with NEGATIVE  hepatitis serologies  . Appendectomy    . Colonoscopy w/ polypectomy  2002    Tics 2005 & 2010; due 2020, Dr Sharlett Iles  . Tonsillectomy     Allergies  Allergen Reactions  . Ramipril     REACTION: elevated liver enzymes  . Penicillins     ? Reaction (as child)    Social History   Social History  . Marital Status: Married    Spouse Name: N/A  . Number of Children: N/A  . Years of Education: N/A   Occupational History  . Not on file.   Social History  Main Topics  . Smoking status: Never Smoker   . Smokeless tobacco: Not on file     Comment: Second hand smoke  . Alcohol Use: No  . Drug Use: No  . Sexual Activity: Not on file   Other Topics Concern  . Not on file   Social History Narrative    Family History  Problem Relation Age of Onset  . Breast cancer Mother   . Rheumatologic disease Mother     RA  . Heart disease Father     PCV;MI ? 59  . Polycythemia Father   . Leukemia Paternal Uncle   . Polycythemia Paternal Uncle   . Heart attack Paternal Grandfather     > 55  . Stroke Maternal Grandmother     . 65  . Diabetes Neg Hx   . Sleep apnea Sister       Review of Systems  Constitutional: Negative for fever, chills, activity change, appetite change and unexpected weight change.  HENT: Negative for congestion, dental problem, postnasal drip, rhinorrhea, sneezing, sore throat, trouble swallowing and voice change.   Eyes: Negative for visual disturbance.  Respiratory: Negative for cough, choking and shortness of breath.   Cardiovascular: Negative for chest pain and leg swelling.  Gastrointestinal:  Negative for nausea, vomiting and abdominal pain.  Genitourinary: Negative for difficulty urinating.  Musculoskeletal: Negative for arthralgias.  Skin: Negative for rash.  Psychiatric/Behavioral: Negative for behavioral problems and confusion.       Objective:   Physical Exam  Gen. Pleasant, well-nourished, in no distress, normal affect ENT - no lesions, no post nasal drip Neck: No JVD, no thyromegaly, no carotid bruits Lungs: no use of accessory muscles, no dullness to percussion, clear without rales or rhonchi  Cardiovascular: Rhythm regular, heart sounds  normal, no murmurs or gallops, no peripheral edema Abdomen: soft and non-tender, no hepatosplenomegaly, BS normal. Musculoskeletal: No deformities, no cyanosis or clubbing Neuro:  alert, non focal       Assessment & Plan:

## 2015-04-07 ENCOUNTER — Encounter: Payer: Self-pay | Admitting: Internal Medicine

## 2015-04-07 ENCOUNTER — Ambulatory Visit (INDEPENDENT_AMBULATORY_CARE_PROVIDER_SITE_OTHER): Payer: 59 | Admitting: Internal Medicine

## 2015-04-07 VITALS — BP 110/76 | HR 76 | Temp 97.6°F | Resp 16 | Wt 185.0 lb

## 2015-04-07 DIAGNOSIS — I10 Essential (primary) hypertension: Secondary | ICD-10-CM | POA: Diagnosis not present

## 2015-04-07 DIAGNOSIS — F419 Anxiety disorder, unspecified: Secondary | ICD-10-CM | POA: Insufficient documentation

## 2015-04-07 MED ORDER — VALSARTAN 80 MG PO TABS: 80.0000 mg | ORAL_TABLET | Freq: Every day | ORAL | Status: DC

## 2015-04-07 MED ORDER — FLUOXETINE HCL 10 MG PO TABS: 10.0000 mg | ORAL_TABLET | Freq: Every day | ORAL | Status: DC

## 2015-04-07 NOTE — Patient Instructions (Addendum)
  We have reviewed your prior records including labs and tests today.   Medications reviewed and updated.  No changes recommended at this time.  Your prescription(s) have been submitted to your pharmacy. Please take as directed and contact our office if you believe you are having problem(s) with the medication(s).  Please followup for your annual physical.

## 2015-04-07 NOTE — Progress Notes (Signed)
Pre visit review using our clinic review tool, if applicable. No additional management support is needed unless otherwise documented below in the visit note. 

## 2015-04-07 NOTE — Assessment & Plan Note (Signed)
Blood pressure controlled. Continue current medication.

## 2015-04-07 NOTE — Assessment & Plan Note (Signed)
Was experiencing physical symptoms consistent with anxiety started on Prozac 10 mg daily, which he is tolerating well Continue current dose of Prozac

## 2015-04-07 NOTE — Progress Notes (Signed)
Subjective:    Patient ID: Steven Wolf, male    DOB: [birth year], 60 y.o.   MRN: [id number]  HPI He is here to establish with a new pcp.    He is here for routine follow up.  Hypertension: He is taking his medication daily. He is compliant with a low sodium diet.  He denies chest pain, palpitations, edema, shortness of breath and regular headaches. He is not exercising regularly, but is very active at work.  He does monitor his blood pressure at home and it is well controlled.    Anxiety, panic attacks:  He was panic attacks in the past that was manifesting his physical symptoms. He started taking fluoxetine 10 mg daily and has been controlling his symptoms. He is happy with the dose.  He follows with neurology. He has BPH and has a history of kidney stone.  He recently saw pulmonary for evaluation of his sleep apnea.  He will have a sleep study soon.  Marland Kitchen  Healthy diet overall, snacking - salty or sweet.    Medications and allergies reviewed with patient and updated if appropriate.  Patient Active Problem List   Diagnosis Date Noted  . Primary snoring 04/04/2015  . Other abnormal glucose 11/11/2013  . DIVERTICULOSIS, COLON 08/11/2009  . BENIGN PROSTATIC HYPERTROPHY 06/15/2008  . HEMATURIA 05/06/2008  . Other and unspecified hyperlipidemia 09/10/2007  . HYPERTENSION, ESSENTIAL NOS 04/17/2007  . COLONIC POLYPS, HX OF 04/17/2007  . SYNCOPE 04/03/2007    Current Outpatient Prescriptions on File Prior to Visit  Medication Sig Dispense Refill  . aspirin 81 MG tablet Take 81 mg by mouth daily.      . Cholecalciferol (VITAMIN D) 2000 UNITS CAPS Take 2,000 Units by mouth daily.     . Coenzyme Q10 (COQ10) 100 MG CAPS Take 1 tablet by mouth daily.     . fish oil-omega-3 fatty acids 1000 MG capsule Take 1 g by mouth 2 (two) times daily.     Marland Kitchen FLUoxetine (PROZAC) 10 MG tablet Take 1 tablet (10 mg total) by mouth daily. 30 tablet 5  . OMEGA-3 KRILL OIL PO Take 1 capsule by mouth  daily.    . valsartan (DIOVAN) 80 MG tablet Take 1 tablet (80 mg total) by mouth daily. 90 tablet 3   No current facility-administered medications on file prior to visit.    Past Medical History  Diagnosis Date  . Hypertension   . Syncope 02/2007    NOCTURNAL POST MICTURTION  . Benign prostatic hypertrophy   . Diverticulosis of colon   . Microscopic hematuria   . Hyperlipidemia   . Nephrolithiasis      X 2; Dr Serita Butcher  . Gilbert syndrome   . Anxiety     Past Surgical History  Procedure Laterality Date  . Liver biopsy      X 1 for elevated LFTs with NEGATIVE  hepatitis serologies  . Appendectomy    . Colonoscopy w/ polypectomy  2002    Tics 2005 & 2010; due 2020, Dr Sharlett Iles  . Tonsillectomy      Social History   Social History  . Marital Status: Married    Spouse Name: N/A  . Number of Children: N/A  . Years of Education: N/A   Social History Main Topics  . Smoking status: Never Smoker   . Smokeless tobacco: Not on file     Comment: Second hand smoke  . Alcohol Use: No  . Drug Use: No  .  Sexual Activity: Not on file   Other Topics Concern  . Not on file   Social History Narrative    Family History  Problem Relation Age of Onset  . Breast cancer Mother   . Rheumatologic disease Mother     RA  . Heart disease Father     PCV;MI ? 66  . Polycythemia Father   . Leukemia Paternal Uncle   . Polycythemia Paternal Uncle   . Heart attack Paternal Grandfather     > 55  . Stroke Maternal Grandmother     . 65  . Diabetes Neg Hx   . Sleep apnea Sister     Review of Systems  Constitutional: Negative for fever, chills, appetite change and unexpected weight change.  Respiratory: Negative for cough, shortness of breath and wheezing.   Cardiovascular: Negative for chest pain, palpitations and leg swelling.  Musculoskeletal: Positive for arthralgias (minimal).  Neurological: Negative for dizziness, light-headedness and headaches.       Objective:    Filed Vitals:   04/07/15 1009  BP: 110/76  Pulse: 76  Temp: 97.6 F (36.4 C)  Resp: 16   Filed Weights   04/07/15 1009  Weight: 185 lb (83.915 kg)   Body mass index is 25.08 kg/(m^2).   Physical Exam Constitutional: Appears well-developed and well-nourished. No distress.  Neck: Neck supple. No tracheal deviation present. No thyromegaly present.  No carotid bruit. No cervical adenopathy.   Cardiovascular: Normal rate, regular rhythm and normal heart sounds.   No murmur heard.  No edema Pulmonary/Chest: Effort normal and breath sounds normal. No respiratory distress. No wheezes.       Assessment & Plan:   See Problem List for Assessment and Plan of chronic medical problems.  Follow up annually for PE

## 2015-04-20 DIAGNOSIS — G4733 Obstructive sleep apnea (adult) (pediatric): Secondary | ICD-10-CM

## 2015-04-25 ENCOUNTER — Telehealth: Payer: Self-pay | Admitting: Pulmonary Disease

## 2015-04-25 DIAGNOSIS — G4733 Obstructive sleep apnea (adult) (pediatric): Secondary | ICD-10-CM

## 2015-04-25 NOTE — Telephone Encounter (Signed)
Patient notified of Sleep Study Results. Patient is okay with doing CPAP Titration study as long as he can do it on a Thursday night if he has to sleep overnight. Order entered for CPAP titration study. Nothing further needed.

## 2015-04-25 NOTE — Telephone Encounter (Signed)
Sleep Study results - 04/20/15  Per Dr. Elsworth Soho:  AHI: 26.0/hr Lowest Sat: 81%  Moderate Sleep Apnea Needs CPAP Titration study

## 2015-04-26 DIAGNOSIS — G4733 Obstructive sleep apnea (adult) (pediatric): Secondary | ICD-10-CM

## 2015-04-27 ENCOUNTER — Other Ambulatory Visit: Payer: Self-pay | Admitting: *Deleted

## 2015-04-27 DIAGNOSIS — G4733 Obstructive sleep apnea (adult) (pediatric): Secondary | ICD-10-CM

## 2015-05-01 ENCOUNTER — Encounter: Payer: Self-pay | Admitting: Cardiology

## 2015-05-05 ENCOUNTER — Ambulatory Visit (HOSPITAL_BASED_OUTPATIENT_CLINIC_OR_DEPARTMENT_OTHER): Payer: 59 | Attending: Pulmonary Disease

## 2015-05-05 VITALS — Ht 72.0 in | Wt 175.0 lb

## 2015-05-05 DIAGNOSIS — G4730 Sleep apnea, unspecified: Secondary | ICD-10-CM | POA: Diagnosis present

## 2015-05-05 DIAGNOSIS — G4733 Obstructive sleep apnea (adult) (pediatric): Secondary | ICD-10-CM | POA: Diagnosis not present

## 2015-05-10 ENCOUNTER — Telehealth: Payer: Self-pay | Admitting: Pulmonary Disease

## 2015-05-10 DIAGNOSIS — G4733 Obstructive sleep apnea (adult) (pediatric): Secondary | ICD-10-CM | POA: Diagnosis not present

## 2015-05-10 NOTE — Progress Notes (Signed)
Patient Name: Steven Wolf, Steven Wolf Date: 05/05/2015 Gender: Male D.O.B: Apr 18, 1955 Age (years): 36 Referring Provider: Kara Mead MD, ABSM Height (inches): 70 Interpreting Physician: Kara Mead MD, ABSM Weight (lbs): 175 RPSGT: Baxter Flattery BMI: 25 MRN: [id number] Neck Size: 17.00   CLINICAL INFORMATION The patient is referred for a CPAP titration to treat sleep apnea. Date of HST: 03/2015, showed AHI 26/hour   SLEEP STUDY TECHNIQUE As per the AASM Manual for the Scoring of Sleep and Associated Events v2.3 (April 2016) with a hypopnea requiring 4% desaturations. The channels recorded and monitored were frontal, central and occipital EEG, electrooculogram (EOG), submentalis EMG (chin), nasal and oral airflow, thoracic and abdominal wall motion, anterior tibialis EMG, snore microphone, electrocardiogram, and pulse oximetry. Continuous positive airway pressure (CPAP) was initiated at the beginning of the study and titrated to treat sleep-disordered breathing.   MEDICATIONS Medications administered by patient during sleep study : No sleep medicine administered.   RESPIRATORY PARAMETERS Optimal PAP Pressure (cm): 9 AHI at Optimal Pressure (/hr): 0.8 Overall Minimal O2 (%): 90.00 Supine % at Optimal Pressure (%): 44 Minimal O2 at Optimal Pressure (%): 91.0     SLEEP ARCHITECTURE The study was initiated at 10:21:09 PM and ended at 5:00:13 AM. Sleep onset time was 17.1 minutes and the sleep efficiency was 87.3%. The total sleep time was 348.4 minutes. The patient spent 3.44% of the night in stage N1 sleep, 67.71% in stage N2 sleep, 24.11% in stage N3 and 4.74% in REM.Stage REM latency was 188.5 minutes Wake after sleep onset was 33.5. Alpha intrusion was absent. Supine sleep was 54.55%.   CARDIAC DATA The 2 lead EKG demonstrated sinus rhythm. The mean heart rate was 68.13 beats per minute. Other EKG findings include: None.   LEG MOVEMENT DATA The total Periodic Limb Movements  of Sleep (PLMS) were 123. The PLMS index was 21.18. A PLMS index of <15 is considered normal in adults.   IMPRESSIONS - The optimal PAP pressure was 9 cm of water. - Central sleep apnea was not noted during this titration (CAI = 0.3/h). - Significant oxygen desaturations were not observed during this titration (min O2 = 90.00%). - No snoring was audible during this study. - No cardiac abnormalities were observed during this study. - Mild periodic limb movements were observed during this study. Arousals associated with PLMs were rare.   DIAGNOSIS - Obstructive Sleep Apnea (327.23 [G47.33 ICD-10])   RECOMMENDATIONS - Trial of CPAP therapy on 9 cm H2O with a Medium size Resmed Nasal Mask Airfit N20 mask and heated humidification. - Avoid alcohol, sedatives and other CNS depressants that may worsen sleep apnea and disrupt normal sleep architecture. - Sleep hygiene should be reviewed to assess factors that may improve sleep quality. - Weight management and regular exercise should be initiated or continued. - Return to [facility] for re-evaluation after 4 weeks of therapy   Kara Mead MD. FCCP. [facility]

## 2015-05-10 NOTE — Telephone Encounter (Signed)
CPAP titration report reviewed Recent prescription for  CPAP therapy on 9 cm H2O with a Medium size Resmed Nasal Mask Airfit N20 mask and heated humidification. Download in 4 weeks Office visit in 6 weeks

## 2015-05-16 NOTE — Telephone Encounter (Signed)
lmtcb x1 for pt. 

## 2015-05-17 NOTE — Telephone Encounter (Signed)
Patient concerned about the cost of CPAP.  He is requesting that we go ahead and place the order for the CPAP, but he wants the DME company to contact him to let him know how much his insurance is going to cover.  Patient stated that he may decide to get the machine through [email or web address] instead of using DME company.  Explained the benefits of using DME company (supplies, repairs, etc) patient says he wants to discuss this with them in detail before making his decision.   Order entered. Awaiting to hear back from patient regarding decision about CPAP.

## 2015-05-23 NOTE — Telephone Encounter (Signed)
Called to follow up with patient regarding CPAP order.  Patient says that his wife received a call from someone, he is not sure if it was our office or DME, but he says that they advised her that they were going to check his insurance and would call back, but he has not heard anything from them since.  He said that he thought that he would have heard something from them by now.    Judeen Hammans, can you check on this? Thanks

## 2015-05-24 NOTE — Telephone Encounter (Signed)
I called Jeneen Rinks our rep for [employer].  He states he will have someone call the pt today and update him on the status of his order.  I called pt's home # & spoke to his wife.  She said this will be fine.  Nothing further needed.

## 2015-06-09 ENCOUNTER — Ambulatory Visit (INDEPENDENT_AMBULATORY_CARE_PROVIDER_SITE_OTHER): Payer: 59 | Admitting: Internal Medicine

## 2015-06-09 ENCOUNTER — Other Ambulatory Visit (INDEPENDENT_AMBULATORY_CARE_PROVIDER_SITE_OTHER): Payer: 59

## 2015-06-09 ENCOUNTER — Encounter: Payer: Self-pay | Admitting: Internal Medicine

## 2015-06-09 VITALS — BP 110/76 | HR 70 | Temp 97.8°F | Resp 16 | Wt 183.0 lb

## 2015-06-09 DIAGNOSIS — I10 Essential (primary) hypertension: Secondary | ICD-10-CM

## 2015-06-09 DIAGNOSIS — F419 Anxiety disorder, unspecified: Secondary | ICD-10-CM

## 2015-06-09 DIAGNOSIS — Z0000 Encounter for general adult medical examination without abnormal findings: Secondary | ICD-10-CM | POA: Diagnosis not present

## 2015-06-09 LAB — CBC WITH DIFFERENTIAL/PLATELET
BASOS ABS: 0 10*3/uL (ref 0.0–0.1)
Basophils Relative: 0.7 % (ref 0.0–3.0)
EOS PCT: 1.7 % (ref 0.0–5.0)
Eosinophils Absolute: 0.1 10*3/uL (ref 0.0–0.7)
HEMATOCRIT: 46.3 % (ref 39.0–52.0)
HEMOGLOBIN: 15.8 g/dL (ref 13.0–17.0)
LYMPHS PCT: 35 % (ref 12.0–46.0)
Lymphs Abs: 1.7 10*3/uL (ref 0.7–4.0)
MCHC: 34.1 g/dL (ref 30.0–36.0)
MCV: 89.8 fl (ref 78.0–100.0)
MONOS PCT: 9.8 % (ref 3.0–12.0)
Monocytes Absolute: 0.5 10*3/uL (ref 0.1–1.0)
Neutro Abs: 2.5 10*3/uL (ref 1.4–7.7)
Neutrophils Relative %: 52.8 % (ref 43.0–77.0)
Platelets: 211 10*3/uL (ref 150.0–400.0)
RBC: 5.15 Mil/uL (ref 4.22–5.81)
RDW: 12.9 % (ref 11.5–15.5)
WBC: 4.8 10*3/uL (ref 4.0–10.5)

## 2015-06-09 LAB — TSH: TSH: 3.17 u[IU]/mL (ref 0.35–4.50)

## 2015-06-09 LAB — COMPREHENSIVE METABOLIC PANEL
ALBUMIN: 4.5 g/dL (ref 3.5–5.2)
ALK PHOS: 72 U/L (ref 39–117)
ALT: 37 U/L (ref 0–53)
AST: 26 U/L (ref 0–37)
BILIRUBIN TOTAL: 1 mg/dL (ref 0.2–1.2)
BUN: 19 mg/dL (ref 6–23)
CALCIUM: 9.7 mg/dL (ref 8.4–10.5)
CO2: 30 mEq/L (ref 19–32)
Chloride: 102 mEq/L (ref 96–112)
Creatinine, Ser: 1.07 mg/dL (ref 0.40–1.50)
GFR: 74.46 mL/min (ref >60.00)
GLUCOSE: 96 mg/dL (ref 70–99)
Potassium: 4.2 mEq/L (ref 3.5–5.1)
Sodium: 139 mEq/L (ref 135–145)
TOTAL PROTEIN: 7.4 g/dL (ref 6.0–8.3)

## 2015-06-09 LAB — LIPID PANEL
CHOL/HDL RATIO: 4
Cholesterol: 174 mg/dL (ref 0–200)
HDL: 42.2 mg/dL (ref >39.00)
LDL Cholesterol: 105 mg/dL — ABNORMAL HIGH (ref 0–99)
NONHDL: 131.45
TRIGLYCERIDES: 133 mg/dL (ref 0.0–149.0)
VLDL: 26.6 mg/dL (ref 0.0–40.0)

## 2015-06-09 LAB — HEMOGLOBIN A1C: Hgb A1c MFr Bld: 5.7 % (ref 4.6–6.5)

## 2015-06-09 NOTE — Assessment & Plan Note (Signed)
Controlled, stable Continue current dose of prozac

## 2015-06-09 NOTE — Progress Notes (Signed)
Pre visit review using our clinic review tool, if applicable. No additional management support is needed unless otherwise documented below in the visit note. 

## 2015-06-09 NOTE — Patient Instructions (Signed)
Test(s) ordered today. Your results will be released to [facility] (or called to you) after review, usually within 72hours after test completion. If any changes need to be made, you will be notified at that same time.  All other Health Maintenance issues reviewed.   All recommended immunizations and age-appropriate screenings are up-to-date.  No immunizations administered today.   Medications reviewed and updated.  No changes recommended at this time.  Please followup annually   Health Maintenance, Male A healthy lifestyle and preventative care can promote health and wellness.  Maintain regular health, dental, and eye exams.  Eat a healthy diet. Foods like vegetables, fruits, whole grains, low-fat dairy products, and lean protein foods contain the nutrients you need and are low in calories. Decrease your intake of foods high in solid fats, added sugars, and salt. Get information about a proper diet from your health care provider, if necessary.  Regular physical exercise is one of the most important things you can do for your health. Most adults should get at least 150 minutes of moderate-intensity exercise (any activity that increases your heart rate and causes you to sweat) each week. In addition, most adults need muscle-strengthening exercises on 2 or more days a week.   Maintain a healthy weight. The body mass index (BMI) is a screening tool to identify possible weight problems. It provides an estimate of body fat based on height and weight. Your health care provider can find your BMI and can help you achieve or maintain a healthy weight. For males 20 years and older:  A BMI below 18.5 is considered underweight.  A BMI of 18.5 to 24.9 is normal.  A BMI of 25 to 29.9 is considered overweight.  A BMI of 30 and above is considered obese.  Maintain normal blood lipids and cholesterol by exercising and minimizing your intake of saturated fat. Eat a balanced diet with plenty of fruits and  vegetables. Blood tests for lipids and cholesterol should begin at age 45 and be repeated every 5 years. If your lipid or cholesterol levels are high, you are over age 55, or you are at high risk for heart disease, you may need your cholesterol levels checked more frequently.Ongoing high lipid and cholesterol levels should be treated with medicines if diet and exercise are not working.  If you smoke, find out from your health care provider how to quit. If you do not use tobacco, do not start.  Lung cancer screening is recommended for adults aged 40-80 years who are at high risk for developing lung cancer because of a history of smoking. A yearly low-dose CT scan of the lungs is recommended for people who have at least a 30-pack-year history of smoking and are current smokers or have quit within the past 15 years. A pack year of smoking is smoking an average of 1 pack of cigarettes a day for 1 year (for example, a 30-pack-year history of smoking could mean smoking 1 pack a day for 30 years or 2 packs a day for 15 years). Yearly screening should continue until the smoker has stopped smoking for at least 15 years. Yearly screening should be stopped for people who develop a health problem that would prevent them from having lung cancer treatment.  If you choose to drink alcohol, do not have more than 2 drinks per day. One drink is considered to be 12 oz (360 mL) of beer, 5 oz (150 mL) of wine, or 1.5 oz (45 mL) of liquor.  Avoid the use of street drugs. Do not share needles with anyone. Ask for help if you need support or instructions about stopping the use of drugs.  High blood pressure causes heart disease and increases the risk of stroke. High blood pressure is more likely to develop in:  People who have blood pressure in the end of the normal range (100-139/85-89 mm Hg).  People who are overweight or obese.  People who are African American.  If you are 61-74 years of age, have your blood pressure  checked every 3-5 years. If you are 72 years of age or older, have your blood pressure checked every year. You should have your blood pressure measured twice--once when you are at a hospital or clinic, and once when you are not at a hospital or clinic. Record the average of the two measurements. To check your blood pressure when you are not at a hospital or clinic, you can use:  An automated blood pressure machine at a pharmacy.  A home blood pressure monitor.  If you are 13-21 years old, ask your health care provider if you should take aspirin to prevent heart disease.  Diabetes screening involves taking a blood sample to check your fasting blood sugar level. This should be done once every 3 years after age 52 if you are at a normal weight and without risk factors for diabetes. Testing should be considered at a younger age or be carried out more frequently if you are overweight and have at least 1 risk factor for diabetes.  Colorectal cancer can be detected and often prevented. Most routine colorectal cancer screening begins at the age of 31 and continues through age 26. However, your health care provider may recommend screening at an earlier age if you have risk factors for colon cancer. On a yearly basis, your health care provider may provide home test kits to check for hidden blood in the stool. A small camera at the end of a tube may be used to directly examine the colon (sigmoidoscopy or colonoscopy) to detect the earliest forms of colorectal cancer. Talk to your health care provider about this at age 46 when routine screening begins. A direct exam of the colon should be repeated every 5-10 years through age 21, unless early forms of precancerous polyps or small growths are found.  People who are at an increased risk for hepatitis B should be screened for this virus. You are considered at high risk for hepatitis B if:  You were born in a country where hepatitis B occurs often. Talk with your  health care provider about which countries are considered high risk.  Your parents were born in a high-risk country and you have not received a shot to protect against hepatitis B (hepatitis B vaccine).  You have HIV or AIDS.  You use needles to inject street drugs.  You live with, or have sex with, someone who has hepatitis B.  You are a man who has sex with other men (MSM).  You get hemodialysis treatment.  You take certain medicines for conditions like cancer, organ transplantation, and autoimmune conditions.  Hepatitis C blood testing is recommended for all people born from [birth year] through 1967 and any individual with known risk factors for hepatitis C.  Healthy men should no longer receive prostate-specific antigen (PSA) blood tests as part of routine cancer screening. Talk to your health care provider about prostate cancer screening.  Testicular cancer screening is not recommended for adolescents or adult males who  have no symptoms. Screening includes self-exam, a health care provider exam, and other screening tests. Consult with your health care provider about any symptoms you have or any concerns you have about testicular cancer.  Practice safe sex. Use condoms and avoid high-risk sexual practices to reduce the spread of sexually transmitted infections (STIs).  You should be screened for STIs, including gonorrhea and chlamydia if:  You are sexually active and are younger than 24 years.  You are older than 24 years, and your health care provider tells you that you are at risk for this type of infection.  Your sexual activity has changed since you were last screened, and you are at an increased risk for chlamydia or gonorrhea. Ask your health care provider if you are at risk.  If you are at risk of being infected with HIV, it is recommended that you take a prescription medicine daily to prevent HIV infection. This is called pre-exposure prophylaxis (PrEP). You are considered at  risk if:  You are a man who has sex with other men (MSM).  You are a heterosexual man who is sexually active with multiple partners.  You take drugs by injection.  You are sexually active with a partner who has HIV.  Talk with your health care provider about whether you are at high risk of being infected with HIV. If you choose to begin PrEP, you should first be tested for HIV. You should then be tested every 3 months for as long as you are taking PrEP.  Use sunscreen. Apply sunscreen liberally and repeatedly throughout the day. You should seek shade when your shadow is shorter than you. Protect yourself by wearing long sleeves, pants, a wide-brimmed hat, and sunglasses year round whenever you are outdoors.  Tell your health care provider of new moles or changes in moles, especially if there is a change in shape or color. Also, tell your health care provider if a mole is larger than the size of a pencil eraser.  A one-time screening for abdominal aortic aneurysm (AAA) and surgical repair of large AAAs by ultrasound is recommended for men aged 8-75 years who are current or former smokers.  Stay current with your vaccines (immunizations).   This information is not intended to replace advice given to you by your health care provider. Make sure you discuss any questions you have with your health care provider.   Document Released: 09/07/2007 Document Revised: 04/01/2014 Document Reviewed: 08/06/2010 Elsevier Interactive Patient Education [employer].

## 2015-06-09 NOTE — Assessment & Plan Note (Signed)
BP Readings from Last 3 Encounters:  06/09/15 110/76  04/07/15 110/76  04/04/15 122/88   BP well controlled Current regimen effective and well tolerated Continue current medications at current doses

## 2015-06-09 NOTE — Progress Notes (Signed)
Subjective:    Patient ID: Steven Wolf, male    DOB: Apr 27, 1955, 60 y.o.   MRN: [id number]  HPI He is here for a physical exam.   Muscle twitching in calves and feet.  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  He was told it was nothing to be concerned about it.  It never went away.  It does get worse with stress. He has muscle tiredness in her upper back but feels that is from stress.  No decreased strength or numbness or tingling.  It has not gotten worse and is just annoying.  He walks a lot, but no exercise routinely. He walks a lot at work.   He was diagnosed with sleep apnea.  He will be fitted for a cpap today.    Medications and allergies reviewed with patient and updated if appropriate.  Patient Active Problem List   Diagnosis Date Noted  . Anxiety 04/07/2015  . Primary snoring 04/04/2015  . Other abnormal glucose 11/11/2013  . DIVERTICULOSIS, COLON 08/11/2009  . BENIGN PROSTATIC HYPERTROPHY 06/15/2008  . HEMATURIA 05/06/2008  . Other and unspecified hyperlipidemia 09/10/2007  . Essential hypertension 04/17/2007  . COLONIC POLYPS, HX OF 04/17/2007  . SYNCOPE 04/03/2007    Current Outpatient Prescriptions on File Prior to Visit  Medication Sig Dispense Refill  . aspirin 81 MG tablet Take 81 mg by mouth daily.      . Cholecalciferol (VITAMIN D) 2000 UNITS CAPS Take 2,000 Units by mouth daily.     . Coenzyme Q10 (COQ10) 100 MG CAPS Take 1 tablet by mouth daily.     . fish oil-omega-3 fatty acids 1000 MG capsule Take 1 g by mouth 2 (two) times daily.     Marland Kitchen FLUoxetine (PROZAC) 10 MG tablet Take 1 tablet (10 mg total) by mouth daily. 90 tablet 3  . OMEGA-3 KRILL OIL PO Take 1 capsule by mouth daily.    . valsartan (DIOVAN) 80 MG tablet Take 1 tablet (80 mg total) by mouth daily. 90 tablet 3   No current facility-administered medications on file prior to visit.    Past Medical History  Diagnosis Date  .  Hypertension   . Syncope 02/2007    NOCTURNAL POST MICTURTION  . Benign prostatic hypertrophy   . Diverticulosis of colon   . Microscopic hematuria   . Hyperlipidemia   . Nephrolithiasis      X 2; Dr Serita Butcher  . Gilbert syndrome   . Anxiety     Past Surgical History  Procedure Laterality Date  . Liver biopsy      X 1 for elevated LFTs with NEGATIVE  hepatitis serologies  . Appendectomy    . Colonoscopy w/ polypectomy  2002    Tics 2005 & 2010; due 2020, Dr Sharlett Iles  . Tonsillectomy      Social History   Social History  . Marital Status: Married    Spouse Name: N/A  . Number of Children: N/A  . Years of Education: N/A   Social History Main Topics  . Smoking status: Never Smoker   . Smokeless tobacco: Not on file     Comment: Second hand smoke  . Alcohol Use: No  . Drug Use: No  . Sexual Activity: Not on file   Other Topics Concern  . Not on file   Social History Narrative    Family History  Problem Relation Age of Onset  .  Breast cancer Mother   . Rheumatologic disease Mother     RA  . Heart disease Father     PCV;MI ? 46  . Polycythemia Father   . Leukemia Paternal Uncle   . Polycythemia Paternal Uncle   . Heart attack Paternal Grandfather     > 55  . Stroke Maternal Grandmother     . 65  . Diabetes Neg Hx   . Sleep apnea Sister     Review of Systems  Constitutional: Negative for fever, chills, appetite change and fatigue.  HENT: Positive for hearing loss (mild).   Eyes: Positive for visual disturbance (dec vision in right eye).  Respiratory: Negative for cough, shortness of breath and wheezing.   Cardiovascular: Negative for chest pain, palpitations and leg swelling.  Gastrointestinal: Positive for constipation. Negative for nausea, abdominal pain, diarrhea and blood in stool (occ hemorrhoidal bleeding).       No gerd  Genitourinary: Negative for dysuria and hematuria.  Musculoskeletal: Negative for myalgias, back pain and arthralgias.    Skin: Negative for color change and rash.  Neurological: Positive for light-headedness (with position changes). Negative for dizziness, weakness, numbness and headaches.  Psychiatric/Behavioral: Negative for sleep disturbance and dysphoric mood. The patient is nervous/anxious (controlled).        Objective:   Filed Vitals:   06/09/15 0803  BP: 110/76  Pulse: 70  Temp: 97.8 F (36.6 C)  Resp: 16   Filed Weights   06/09/15 0803  Weight: 183 lb (83.008 kg)   Body mass index is 24.81 kg/(m^2).   Physical Exam Constitutional: He appears well-developed and well-nourished. No distress.  HENT:  Head: Normocephalic and atraumatic.  Right Ear: External ear normal.  Left Ear: External ear normal.  Mouth/Throat: Oropharynx is clear and moist.  Normal ear canals and TM b/l  Eyes: Conjunctivae and EOM are normal.  Neck: Neck supple. No tracheal deviation present. No thyromegaly present.  No carotid bruit  Cardiovascular: Normal rate, regular rhythm, normal heart sounds and intact distal pulses.   No murmur heard. Pulmonary/Chest: Effort normal and breath sounds normal. No respiratory distress. He has no wheezes. He has no rales.  Abdominal: Soft. Bowel sounds are normal. He exhibits no distension. There is no tenderness.  Genitourinary: deferred to urology Musculoskeletal: He exhibits no edema.  Lymphadenopathy:    He has no cervical adenopathy.  Skin: Skin is warm and dry. He is not diaphoretic.  Psychiatric: He has a normal mood and affect. His behavior is normal.        Assessment & Plan:   Physical exam: Screening blood work ordered Immunizations up to date Colonoscopy  Up to date Eye exams - will schedule - due EKG - normal in 2015 Exercise - very active, no regular exercise besides walking at work Weight - normal BMI Skin  - avoids sun or uses protection Substance abuse - no concern for abuse  See Problem List for Assessment and Plan of chronic medical  problems.  Follow up annually

## 2015-06-10 LAB — HEPATITIS C ANTIBODY: HCV Ab: NEGATIVE

## 2015-06-11 ENCOUNTER — Encounter: Payer: Self-pay | Admitting: Internal Medicine

## 2015-06-15 ENCOUNTER — Encounter (HOSPITAL_BASED_OUTPATIENT_CLINIC_OR_DEPARTMENT_OTHER): Payer: 59

## 2016-03-24 ENCOUNTER — Other Ambulatory Visit: Payer: Self-pay | Admitting: Internal Medicine

## 2016-03-24 DIAGNOSIS — I10 Essential (primary) hypertension: Secondary | ICD-10-CM

## 2016-04-08 ENCOUNTER — Other Ambulatory Visit: Payer: Self-pay | Admitting: Internal Medicine

## 2016-06-25 ENCOUNTER — Other Ambulatory Visit: Payer: Self-pay | Admitting: Internal Medicine

## 2016-06-25 DIAGNOSIS — I10 Essential (primary) hypertension: Secondary | ICD-10-CM

## 2016-06-27 NOTE — Patient Instructions (Addendum)
Test(s) ordered today. Your results will be released to [facility] (or called to you) after review, usually within 72hours after test completion. If any changes need to be made, you will be notified at that same time.  All other Health Maintenance issues reviewed.   All recommended immunizations and age-appropriate screenings are up-to-date or discussed.  Tetanus immunization administered today.   Medications reviewed and updated.  Changes include  /  No changes recommended at this time.  Your prescription(s) have been submitted to your pharmacy. Please take as directed and contact our office if you believe you are having problem(s) with the medication(s).   Please followup in one year    Health Maintenance, Male A healthy lifestyle and preventive care is important for your health and wellness. Ask your health care provider about what schedule of regular examinations is right for you. What should I know about weight and diet?  Eat a Healthy Diet  Eat plenty of vegetables, fruits, whole grains, low-fat dairy products, and lean protein.  Do not eat a lot of foods high in solid fats, added sugars, or salt. Maintain a Healthy Weight  Regular exercise can help you achieve or maintain a healthy weight. You should:  Do at least 150 minutes of exercise each week. The exercise should increase your heart rate and make you sweat (moderate-intensity exercise).  Do strength-training exercises at least twice a week. Watch Your Levels of Cholesterol and Blood Lipids  Have your blood tested for lipids and cholesterol every 5 years starting at 63 years of age. If you are at high risk for heart disease, you should start having your blood tested when you are 63 years old. You may need to have your cholesterol levels checked more often if:  Your lipid or cholesterol levels are high.  You are older than 63 years of age.  You are at high risk for heart disease. What should I know about cancer  screening? Many types of cancers can be detected early and may often be prevented. Lung Cancer  You should be screened every year for lung cancer if:  You are a current smoker who has smoked for at least 30 years.  You are a former smoker who has quit within the past 15 years.  Talk to your health care provider about your screening options, when you should start screening, and how often you should be screened. Colorectal Cancer  Routine colorectal cancer screening usually begins at 63 years of age and should be repeated every 5-10 years until you are 63 years old. You may need to be screened more often if early forms of precancerous polyps or small growths are found. Your health care provider may recommend screening at an earlier age if you have risk factors for colon cancer.  Your health care provider may recommend using home test kits to check for hidden blood in the stool.  A small camera at the end of a tube can be used to examine your colon (sigmoidoscopy or colonoscopy). This checks for the earliest forms of colorectal cancer. Prostate and Testicular Cancer  Depending on your age and overall health, your health care provider may do certain tests to screen for prostate and testicular cancer.  Talk to your health care provider about any symptoms or concerns you have about testicular or prostate cancer. Skin Cancer  Check your skin from head to toe regularly.  Tell your health care provider about any new moles or changes in moles, especially if:  There is  a change in a mole's size, shape, or color.  You have a mole that is larger than a pencil eraser.  Always use sunscreen. Apply sunscreen liberally and repeat throughout the day.  Protect yourself by wearing long sleeves, pants, a wide-brimmed hat, and sunglasses when outside. What should I know about heart disease, diabetes, and high blood pressure?  If you are 69-92 years of age, have your blood pressure checked every 3-5  years. If you are 52 years of age or older, have your blood pressure checked every year. You should have your blood pressure measured twice-once when you are at a hospital or clinic, and once when you are not at a hospital or clinic. Record the average of the two measurements. To check your blood pressure when you are not at a hospital or clinic, you can use:  An automated blood pressure machine at a pharmacy.  A home blood pressure monitor.  Talk to your health care provider about your target blood pressure.  If you are between 13-34 years old, ask your health care provider if you should take aspirin to prevent heart disease.  Have regular diabetes screenings by checking your fasting blood sugar level.  If you are at a normal weight and have a low risk for diabetes, have this test once every three years after the age of 4.  If you are overweight and have a high risk for diabetes, consider being tested at a younger age or more often.  A one-time screening for abdominal aortic aneurysm (AAA) by ultrasound is recommended for men aged 60-75 years who are current or former smokers. What should I know about preventing infection? Hepatitis B  If you have a higher risk for hepatitis B, you should be screened for this virus. Talk with your health care provider to find out if you are at risk for hepatitis B infection. Hepatitis C  Blood testing is recommended for:  Everyone born from [birth year] through 1967.  Anyone with known risk factors for hepatitis C. Sexually Transmitted Diseases (STDs)  You should be screened each year for STDs including gonorrhea and chlamydia if:  You are sexually active and are younger than 63 years of age.  You are older than 63 years of age and your health care provider tells you that you are at risk for this type of infection.  Your sexual activity has changed since you were last screened and you are at an increased risk for chlamydia or gonorrhea. Ask your health  care provider if you are at risk.  Talk with your health care provider about whether you are at high risk of being infected with HIV. Your health care provider may recommend a prescription medicine to help prevent HIV infection. What else can I do?  Schedule regular health, dental, and eye exams.  Stay current with your vaccines (immunizations).  Do not use any tobacco products, such as cigarettes, chewing tobacco, and e-cigarettes. If you need help quitting, ask your health care provider.  Limit alcohol intake to no more than 2 drinks per day. One drink equals 12 ounces of beer, 5 ounces of wine, or 1 ounces of hard liquor.  Do not use street drugs.  Do not share needles.  Ask your health care provider for help if you need support or information about quitting drugs.  Tell your health care provider if you often feel depressed.  Tell your health care provider if you have ever been abused or do not feel safe at home.  This information is not intended to replace advice given to you by your health care provider. Make sure you discuss any questions you have with your health care provider. Document Released: 09/07/2007 Document Revised: 11/08/2015 Document Reviewed: 12/13/2014 Elsevier Interactive Patient Education  2017 [employer].

## 2016-06-27 NOTE — Progress Notes (Signed)
Subjective:    Patient ID: Steven Wolf, male    DOB: April 21, 1955, 61 y.o.   MRN: [id number]  HPI He is here for a physical exam.   He has no concerns.  He denies changes in his history since he was here.   Medications and allergies reviewed with patient and updated if appropriate.  Patient Active Problem List   Diagnosis Date Noted  . Anxiety 04/07/2015  . Primary snoring 04/04/2015  . Other abnormal glucose 11/11/2013  . DIVERTICULOSIS, COLON 08/11/2009  . BENIGN PROSTATIC HYPERTROPHY 06/15/2008  . HEMATURIA 05/06/2008  . Other and unspecified hyperlipidemia 09/10/2007  . Essential hypertension 04/17/2007  . COLONIC POLYPS, HX OF 04/17/2007  . SYNCOPE 04/03/2007    Current Outpatient Prescriptions on File Prior to Visit  Medication Sig Dispense Refill  . aspirin 81 MG tablet Take 81 mg by mouth daily.      . Cholecalciferol (VITAMIN D) 2000 UNITS CAPS Take 2,000 Units by mouth daily.     . Coenzyme Q10 (COQ10) 100 MG CAPS Take 1 tablet by mouth daily.     . fish oil-omega-3 fatty acids 1000 MG capsule Take 1 g by mouth 2 (two) times daily.     Marland Kitchen FLUoxetine (PROZAC) 10 MG tablet TAKE 1 TABLET (10 MG TOTAL) BY MOUTH DAILY. 90 tablet 0  . OMEGA-3 KRILL OIL PO Take 1 capsule by mouth daily.    . valsartan (DIOVAN) 80 MG tablet Take 1 tablet (80 mg total) by mouth daily. 90 tablet 0   No current facility-administered medications on file prior to visit.     Past Medical History:  Diagnosis Date  . Anxiety   . Benign prostatic hypertrophy   . Diverticulosis of colon   . Gilbert syndrome   . Hyperlipidemia   . Hypertension   . Microscopic hematuria   . Nephrolithiasis     X 2; Dr Serita Butcher  . Syncope 02/2007   NOCTURNAL POST [facility]    Past Surgical History:  Procedure Laterality Date  . APPENDECTOMY    . COLONOSCOPY W/ POLYPECTOMY  2002   Tics 2005 & 2010; due 2020, Dr Sharlett Iles  . LIVER BIOPSY     X 1 for elevated LFTs with NEGATIVE  hepatitis  serologies  . TONSILLECTOMY      Social History   Social History  . Marital status: Married    Spouse name: N/A  . Number of children: N/A  . Years of education: N/A   Social History Main Topics  . Smoking status: Never Smoker  . Smokeless tobacco: Not on file     Comment: Second hand smoke  . Alcohol use No  . Drug use: No  . Sexual activity: Not on file   Other Topics Concern  . Not on file   Social History Narrative  . No narrative on file    Family History  Problem Relation Age of Onset  . Breast cancer Mother   . Rheumatologic disease Mother     RA  . Heart disease Father     PCV;MI ? 75  . Polycythemia Father   . Leukemia Paternal Uncle   . Polycythemia Paternal Uncle   . Heart attack Paternal Grandfather     > 55  . Stroke Maternal Grandmother     . 65  . Diabetes Neg Hx   . Sleep apnea Sister     Review of Systems  Constitutional: Negative for appetite change, chills, fatigue, fever and unexpected weight  change.  Eyes: Negative for visual disturbance.  Respiratory: Negative for cough, shortness of breath and wheezing.   Cardiovascular: Negative for chest pain, palpitations and leg swelling.  Gastrointestinal: Positive for anal bleeding. Negative for abdominal pain, blood in stool, constipation, diarrhea and nausea.       No gerd  Genitourinary: Negative for difficulty urinating, dysuria and hematuria.  Musculoskeletal: Negative for arthralgias and back pain.  Skin: Negative for rash.       Bump on scalp - irritation from glasses  Neurological: Positive for dizziness (mild, transient, if stands up quickly). Negative for light-headedness and headaches.  Psychiatric/Behavioral: Negative for dysphoric mood. The patient is nervous/anxious (controlled).        Objective:   Vitals:   06/28/16 0844  BP: 110/84  Pulse: 67  Resp: 16  Temp: 97.8 F (36.6 C)   Filed Weights   06/28/16 0844  Weight: 184 lb (83.5 kg)   Body mass index is 24.95  kg/m.  Wt Readings from Last 3 Encounters:  06/28/16 184 lb (83.5 kg)  06/09/15 183 lb (83 kg)  05/05/15 175 lb (79.4 kg)     Physical Exam Constitutional: He appears well-developed and well-nourished. No distress.  HENT:  Head: Normocephalic and atraumatic.  Right Ear: External ear normal.  Left Ear: External ear normal.  Mouth/Throat: Oropharynx is clear and moist.  Normal ear canals and TM b/l  Eyes: Conjunctivae and EOM are normal.  Neck: Neck supple. No tracheal deviation present. No thyromegaly present.  No carotid bruit  Cardiovascular: Normal rate, regular rhythm, normal heart sounds and intact distal pulses.   No murmur heard. Pulmonary/Chest: Effort normal and breath sounds normal. No respiratory distress. He has no wheezes. He has no rales.  Abdominal: Soft. Bowel sounds are normal. He exhibits no distension. There is no tenderness.  Genitourinary: deferred to urology Musculoskeletal: He exhibits no edema.  Lymphadenopathy:    He has no cervical adenopathy.  Skin: Skin is warm and dry. He is not diaphoretic.  Psychiatric: He has a normal mood and affect. His behavior is normal.         Assessment & Plan:   Physical exam: Screening blood work  ordered Immunizations   tdap today, others up to date  Colonoscopy  Up to date  Eye exams  Up to date  EKG  Last done 2015 Exercise - walks regularly Weight --   Normal BMI Skin bump on head - no obvious cancer, but when sees derm will point this out to them to be evaluated Substance abuse none  See Problem List for Assessment and Plan of chronic medical problems.   FU annually

## 2016-06-28 ENCOUNTER — Other Ambulatory Visit (INDEPENDENT_AMBULATORY_CARE_PROVIDER_SITE_OTHER): Payer: 59

## 2016-06-28 ENCOUNTER — Ambulatory Visit (INDEPENDENT_AMBULATORY_CARE_PROVIDER_SITE_OTHER): Payer: 59 | Admitting: Internal Medicine

## 2016-06-28 ENCOUNTER — Encounter: Payer: Self-pay | Admitting: Internal Medicine

## 2016-06-28 VITALS — BP 110/84 | HR 67 | Temp 97.8°F | Resp 16 | Ht 72.0 in | Wt 184.0 lb

## 2016-06-28 DIAGNOSIS — R7309 Other abnormal glucose: Secondary | ICD-10-CM

## 2016-06-28 DIAGNOSIS — Z114 Encounter for screening for human immunodeficiency virus [HIV]: Secondary | ICD-10-CM

## 2016-06-28 DIAGNOSIS — N400 Enlarged prostate without lower urinary tract symptoms: Secondary | ICD-10-CM

## 2016-06-28 DIAGNOSIS — Z0000 Encounter for general adult medical examination without abnormal findings: Secondary | ICD-10-CM | POA: Diagnosis not present

## 2016-06-28 DIAGNOSIS — G4733 Obstructive sleep apnea (adult) (pediatric): Secondary | ICD-10-CM | POA: Diagnosis not present

## 2016-06-28 DIAGNOSIS — F419 Anxiety disorder, unspecified: Secondary | ICD-10-CM

## 2016-06-28 DIAGNOSIS — Z125 Encounter for screening for malignant neoplasm of prostate: Secondary | ICD-10-CM

## 2016-06-28 DIAGNOSIS — Z23 Encounter for immunization: Secondary | ICD-10-CM

## 2016-06-28 DIAGNOSIS — Z9989 Dependence on other enabling machines and devices: Secondary | ICD-10-CM

## 2016-06-28 DIAGNOSIS — I10 Essential (primary) hypertension: Secondary | ICD-10-CM | POA: Diagnosis not present

## 2016-06-28 LAB — CBC WITH DIFFERENTIAL/PLATELET
BASOS PCT: 0.7 % (ref 0.0–3.0)
Basophils Absolute: 0 10*3/uL (ref 0.0–0.1)
EOS PCT: 2.2 % (ref 0.0–5.0)
Eosinophils Absolute: 0.1 10*3/uL (ref 0.0–0.7)
HEMATOCRIT: 47.1 % (ref 39.0–52.0)
Hemoglobin: 16.1 g/dL (ref 13.0–17.0)
Lymphocytes Relative: 36 % (ref 12.0–46.0)
Lymphs Abs: 1.4 10*3/uL (ref 0.7–4.0)
MCHC: 34 g/dL (ref 30.0–36.0)
MCV: 90.8 fl (ref 78.0–100.0)
MONOS PCT: 10.2 % (ref 3.0–12.0)
Monocytes Absolute: 0.4 10*3/uL (ref 0.1–1.0)
NEUTROS ABS: 2 10*3/uL (ref 1.4–7.7)
Neutrophils Relative %: 50.9 % (ref 43.0–77.0)
PLATELETS: 194 10*3/uL (ref 150.0–400.0)
RBC: 5.19 Mil/uL (ref 4.22–5.81)
RDW: 13.1 % (ref 11.5–15.5)
WBC: 3.9 10*3/uL — ABNORMAL LOW (ref 4.0–10.5)

## 2016-06-28 LAB — LIPID PANEL
CHOLESTEROL: 182 mg/dL (ref 0–200)
HDL: 45.9 mg/dL (ref >39.00)
LDL CALC: 117 mg/dL — AB (ref 0–99)
NonHDL: 136.5
Total CHOL/HDL Ratio: 4
Triglycerides: 98 mg/dL (ref 0.0–149.0)
VLDL: 19.6 mg/dL (ref 0.0–40.0)

## 2016-06-28 LAB — COMPREHENSIVE METABOLIC PANEL
ALT: 40 U/L (ref 0–53)
AST: 27 U/L (ref 0–37)
Albumin: 4.7 g/dL (ref 3.5–5.2)
Alkaline Phosphatase: 75 U/L (ref 39–117)
BUN: 15 mg/dL (ref 6–23)
CALCIUM: 9.6 mg/dL (ref 8.4–10.5)
CHLORIDE: 104 meq/L (ref 96–112)
CO2: 32 mEq/L (ref 19–32)
Creatinine, Ser: 1.16 mg/dL (ref 0.40–1.50)
GFR: 67.6 mL/min (ref >60.00)
Glucose, Bld: 97 mg/dL (ref 70–99)
POTASSIUM: 4.4 meq/L (ref 3.5–5.1)
Sodium: 141 mEq/L (ref 135–145)
Total Bilirubin: 0.8 mg/dL (ref 0.2–1.2)
Total Protein: 7.5 g/dL (ref 6.0–8.3)

## 2016-06-28 LAB — HIV ANTIBODY (ROUTINE TESTING W REFLEX): HIV: NONREACTIVE

## 2016-06-28 LAB — HEMOGLOBIN A1C: Hgb A1c MFr Bld: 5.7 % (ref 4.6–6.5)

## 2016-06-28 LAB — TSH: TSH: 4.45 u[IU]/mL (ref 0.35–4.50)

## 2016-06-28 MED ORDER — VALSARTAN 80 MG PO TABS: 80.0000 mg | ORAL_TABLET | Freq: Every day | ORAL | 3 refills | Status: DC

## 2016-06-28 MED ORDER — FLUOXETINE HCL 10 MG PO TABS: 10.0000 mg | ORAL_TABLET | Freq: Every day | ORAL | 3 refills | Status: DC

## 2016-06-28 NOTE — Assessment & Plan Note (Signed)
Uses cpap nightly  

## 2016-06-28 NOTE — Assessment & Plan Note (Signed)
BP well controlled Current regimen effective and well tolerated Continue current medications at current doses  

## 2016-06-28 NOTE — Addendum Note (Signed)
Addended by: Terence Lux B on: 06/28/2016 03:40 PM   Modules accepted: Orders

## 2016-06-28 NOTE — Assessment & Plan Note (Signed)
Check psa Follows with urology

## 2016-06-28 NOTE — Progress Notes (Signed)
Pre visit review using our clinic review tool, if applicable. No additional management support is needed unless otherwise documented below in the visit note. 

## 2016-06-28 NOTE — Assessment & Plan Note (Signed)
Check a1c No fam hx of dm

## 2016-06-28 NOTE — Assessment & Plan Note (Signed)
Controlled, stable Continue current dose of medication  

## 2016-07-01 LAB — PSA, TOTAL AND FREE
PSA, % Free: 38 % (ref >25)
PSA, FREE: 0.5 ng/mL
PSA, TOTAL: 1.3 ng/mL (ref <=4.0)

## 2016-07-04 ENCOUNTER — Encounter: Payer: Self-pay | Admitting: Internal Medicine

## 2016-07-19 ENCOUNTER — Encounter: Payer: 59 | Admitting: Internal Medicine

## 2016-08-12 DIAGNOSIS — K091 Developmental (nonodontogenic) cysts of oral region: Secondary | ICD-10-CM | POA: Diagnosis not present

## 2016-09-16 DIAGNOSIS — K091 Developmental (nonodontogenic) cysts of oral region: Secondary | ICD-10-CM | POA: Diagnosis not present

## 2016-09-17 DIAGNOSIS — G4733 Obstructive sleep apnea (adult) (pediatric): Secondary | ICD-10-CM | POA: Diagnosis not present

## 2016-11-24 DIAGNOSIS — Z23 Encounter for immunization: Secondary | ICD-10-CM | POA: Diagnosis not present

## 2017-05-29 NOTE — Patient Instructions (Addendum)
Eye doctor:  [facility], [facility], [facility]  Test(s) ordered today. Your results will be released to [employer] (or called to you) after review, usually within 72hours after test completion. If any changes need to be made, you will be notified at that same time.  All other Health Maintenance issues reviewed.   All recommended immunizations and age-appropriate screenings are up-to-date or discussed.  No immunizations administered today.   Medications reviewed and updated.  No changes recommended at this time.  Your prescription(s) have been submitted to your pharmacy. Please take as directed and contact our office if you believe you are having problem(s) with the medication(s).  Please followup in one year    Health Maintenance, Male A healthy lifestyle and preventive care is important for your health and wellness. Ask your health care provider about what schedule of regular examinations is right for you. What should I know about weight and diet? Eat a Healthy Diet  Eat plenty of vegetables, fruits, whole grains, low-fat dairy products, and lean protein.  Do not eat a lot of foods high in solid fats, added sugars, or salt.  Maintain a Healthy Weight Regular exercise can help you achieve or maintain a healthy weight. You should:  Do at least 150 minutes of exercise each week. The exercise should increase your heart rate and make you sweat (moderate-intensity exercise).  Do strength-training exercises at least twice a week.  Watch Your Levels of Cholesterol and Blood Lipids  Have your blood tested for lipids and cholesterol every 5 years starting at 64 years of age. If you are at high risk for heart disease, you should start having your blood tested when you are 64 years old. You may need to have your cholesterol levels checked more often if: ? Your lipid or cholesterol levels are high. ? You are older than 64 years of age. ? You are at high risk for  heart disease.  What should I know about cancer screening? Many types of cancers can be detected early and may often be prevented. Lung Cancer  You should be screened every year for lung cancer if: ? You are a current smoker who has smoked for at least 30 years. ? You are a former smoker who has quit within the past 15 years.  Talk to your health care provider about your screening options, when you should start screening, and how often you should be screened.  Colorectal Cancer  Routine colorectal cancer screening usually begins at 64 years of age and should be repeated every 5-10 years until you are 64 years old. You may need to be screened more often if early forms of precancerous polyps or small growths are found. Your health care provider may recommend screening at an earlier age if you have risk factors for colon cancer.  Your health care provider may recommend using home test kits to check for hidden blood in the stool.  A small camera at the end of a tube can be used to examine your colon (sigmoidoscopy or colonoscopy). This checks for the earliest forms of colorectal cancer.  Prostate and Testicular Cancer  Depending on your age and overall health, your health care provider may do certain tests to screen for prostate and testicular cancer.  Talk to your health care provider about any symptoms or concerns you have about testicular or prostate cancer.  Skin Cancer  Check your skin from head to toe regularly.  Tell your health care provider about any new moles  or changes in moles, especially if: ? There is a change in a mole's size, shape, or color. ? You have a mole that is larger than a pencil eraser.  Always use sunscreen. Apply sunscreen liberally and repeat throughout the day.  Protect yourself by wearing long sleeves, pants, a wide-brimmed hat, and sunglasses when outside.  What should I know about heart disease, diabetes, and high blood pressure?  If you are 3-24  years of age, have your blood pressure checked every 3-5 years. If you are 74 years of age or older, have your blood pressure checked every year. You should have your blood pressure measured twice-once when you are at a hospital or clinic, and once when you are not at a hospital or clinic. Record the average of the two measurements. To check your blood pressure when you are not at a hospital or clinic, you can use: ? An automated blood pressure machine at a pharmacy. ? A home blood pressure monitor.  Talk to your health care provider about your target blood pressure.  If you are between 33-72 years old, ask your health care provider if you should take aspirin to prevent heart disease.  Have regular diabetes screenings by checking your fasting blood sugar level. ? If you are at a normal weight and have a low risk for diabetes, have this test once every three years after the age of 19. ? If you are overweight and have a high risk for diabetes, consider being tested at a younger age or more often.  A one-time screening for abdominal aortic aneurysm (AAA) by ultrasound is recommended for men aged 94-75 years who are current or former smokers. What should I know about preventing infection? Hepatitis B If you have a higher risk for hepatitis B, you should be screened for this virus. Talk with your health care provider to find out if you are at risk for hepatitis B infection. Hepatitis C Blood testing is recommended for:  Everyone born from [birth year] through 1965.  Anyone with known risk factors for hepatitis C.  Sexually Transmitted Diseases (STDs)  You should be screened each year for STDs including gonorrhea and chlamydia if: ? You are sexually active and are younger than 64 years of age. ? You are older than 64 years of age and your health care provider tells you that you are at risk for this type of infection. ? Your sexual activity has changed since you were last screened and you are at an  increased risk for chlamydia or gonorrhea. Ask your health care provider if you are at risk.  Talk with your health care provider about whether you are at high risk of being infected with HIV. Your health care provider may recommend a prescription medicine to help prevent HIV infection.  What else can I do?  Schedule regular health, dental, and eye exams.  Stay current with your vaccines (immunizations).  Do not use any tobacco products, such as cigarettes, chewing tobacco, and e-cigarettes. If you need help quitting, ask your health care provider.  Limit alcohol intake to no more than 2 drinks per day. One drink equals 12 ounces of beer, 5 ounces of wine, or 1 ounces of hard liquor.  Do not use street drugs.  Do not share needles.  Ask your health care provider for help if you need support or information about quitting drugs.  Tell your health care provider if you often feel depressed.  Tell your health care provider if you have  ever been abused or do not feel safe at home. This information is not intended to replace advice given to you by your health care provider. Make sure you discuss any questions you have with your health care provider. Document Released: 09/07/2007 Document Revised: 11/08/2015 Document Reviewed: 12/13/2014 Elsevier Interactive Patient Education  2018 [employer] Inc.  

## 2017-05-29 NOTE — Progress Notes (Signed)
Subjective:    Patient ID: Steven Wolf, male    DOB: 1955/09/09, 62 y.o.   MRN: [id number]  HPI He is here for a physical exam.   He has no updates and has no concerns.    Medications and allergies reviewed with patient and updated if appropriate.  Patient Active Problem List   Diagnosis Date Noted  . OSA on CPAP 06/28/2016  . Anxiety 04/07/2015  . Prediabetes 11/11/2013  . DIVERTICULOSIS, COLON 08/11/2009  . BPH (benign prostatic hyperplasia) 06/15/2008  . HEMATURIA 05/06/2008  . Hyperlipidemia 09/10/2007  . Essential hypertension, benign 04/17/2007  . COLONIC POLYPS, HX OF 04/17/2007  . SYNCOPE 04/03/2007    Current Outpatient Medications on File Prior to Visit  Medication Sig Dispense Refill  . aspirin 81 MG tablet Take 81 mg by mouth daily.      . Cholecalciferol (VITAMIN D) 2000 UNITS CAPS Take 2,000 Units by mouth daily.     . Coenzyme Q10 (COQ10) 100 MG CAPS Take 1 tablet by mouth daily.     . fish oil-omega-3 fatty acids 1000 MG capsule Take 1 g by mouth 2 (two) times daily.     . OMEGA-3 KRILL OIL PO Take 1 capsule by mouth daily.     No current facility-administered medications on file prior to visit.     Past Medical History:  Diagnosis Date  . Anxiety   . Benign prostatic hypertrophy   . Diverticulosis of colon   . Gilbert syndrome   . Hyperlipidemia   . Hypertension   . Microscopic hematuria   . Nephrolithiasis     X 2; Dr Serita Butcher  . Syncope 02/2007   NOCTURNAL POST [facility]    Past Surgical History:  Procedure Laterality Date  . APPENDECTOMY    . COLONOSCOPY W/ POLYPECTOMY  2002   Tics 2005 & 2010; due 2020, Dr Sharlett Iles  . LIVER BIOPSY     X 1 for elevated LFTs with NEGATIVE  hepatitis serologies  . TONSILLECTOMY      Social History   Socioeconomic History  . Marital status: Married    Spouse name: None  . Number of children: None  . Years of education: None  . Highest education level: None  Social Needs  . Financial  resource strain: None  . Food insecurity - worry: None  . Food insecurity - inability: None  . Transportation needs - medical: None  . Transportation needs - non-medical: None  Occupational History  . None  Tobacco Use  . Smoking status: Never Smoker  . Smokeless tobacco: Never Used  . Tobacco comment: Second hand smoke  Substance and Sexual Activity  . Alcohol use: No  . Drug use: No  . Sexual activity: None  Other Topics Concern  . None  Social History Narrative  . None    Family History  Problem Relation Age of Onset  . Breast cancer Mother   . Rheumatologic disease Mother        RA  . Heart disease Father        PCV;MI ? 15  . Polycythemia Father   . Leukemia Paternal Uncle   . Polycythemia Paternal Uncle   . Heart attack Paternal Grandfather        > 55  . Stroke Maternal Grandmother        . 65  . Sleep apnea Sister   . Diabetes Neg Hx     Review of Systems  Constitutional: Negative for chills and  fever.  Eyes: Negative for visual disturbance.  Respiratory: Negative for cough, shortness of breath and wheezing.   Cardiovascular: Negative for chest pain, palpitations and leg swelling.  Gastrointestinal: Positive for anal bleeding. Negative for abdominal pain, blood in stool, constipation, diarrhea and nausea.       No gerd  Genitourinary: Negative for difficulty urinating, dysuria and hematuria.  Musculoskeletal: Positive for back pain (episode of LL back pain). Negative for arthralgias.  Skin: Negative for color change and rash.  Neurological: Negative for dizziness, light-headedness and headaches.  Psychiatric/Behavioral: Negative for dysphoric mood. The patient is nervous/anxious.        Objective:   Vitals:   05/30/17 0903  BP: 124/80  Pulse: 63  Resp: 16  Temp: 97.8 F (36.6 C)  SpO2: 98%   Filed Weights   05/30/17 0903  Weight: 186 lb (84.4 kg)   Body mass index is 25.23 kg/m.  Wt Readings from Last 3 Encounters:  05/30/17 186 lb  (84.4 kg)  06/28/16 184 lb (83.5 kg)  06/09/15 183 lb (83 kg)     Physical Exam Constitutional: He appears well-developed and well-nourished. No distress.  HENT:  Head: Normocephalic and atraumatic.  Right Ear: External ear normal.  Left Ear: External ear normal.  Mouth/Throat: Oropharynx is clear and moist.  Normal ear canals and TM b/l  Eyes: Conjunctivae and EOM are normal.  Neck: Neck supple. No tracheal deviation present. No thyromegaly present.  No carotid bruit  Cardiovascular: Normal rate, regular rhythm, normal heart sounds and intact distal pulses.   No murmur heard. Pulmonary/Chest: Effort normal and breath sounds normal. No respiratory distress. He has no wheezes. He has no rales.  Abdominal: Soft. He exhibits no distension. There is no tenderness.  Genitourinary: deferred  Musculoskeletal: He exhibits no edema.  Lymphadenopathy:   He has no cervical adenopathy.  Skin: Skin is warm and dry. He is not diaphoretic.  Psychiatric: He has a normal mood and affect. His behavior is normal.         Assessment & Plan:   Physical exam: Screening blood work  ordered Immunizations   Discussed shingrix, others up to date Colonoscopy   Up to date  Eye exams   Up to date  EKG  Last done 2015 Exercise    walking Weight  Normal BMI for age Skin   No concerns Substance abuse  none  See Problem List for Assessment and Plan of chronic medical problems.   FU in one year

## 2017-05-30 ENCOUNTER — Ambulatory Visit (INDEPENDENT_AMBULATORY_CARE_PROVIDER_SITE_OTHER): Payer: 59 | Admitting: Internal Medicine

## 2017-05-30 ENCOUNTER — Encounter: Payer: Self-pay | Admitting: Internal Medicine

## 2017-05-30 ENCOUNTER — Other Ambulatory Visit (INDEPENDENT_AMBULATORY_CARE_PROVIDER_SITE_OTHER): Payer: 59

## 2017-05-30 VITALS — BP 124/80 | HR 63 | Temp 97.8°F | Resp 16 | Ht 72.0 in | Wt 186.0 lb

## 2017-05-30 DIAGNOSIS — Z0000 Encounter for general adult medical examination without abnormal findings: Secondary | ICD-10-CM | POA: Diagnosis not present

## 2017-05-30 DIAGNOSIS — E7849 Other hyperlipidemia: Secondary | ICD-10-CM | POA: Diagnosis not present

## 2017-05-30 DIAGNOSIS — R7309 Other abnormal glucose: Secondary | ICD-10-CM

## 2017-05-30 DIAGNOSIS — Z9989 Dependence on other enabling machines and devices: Secondary | ICD-10-CM | POA: Diagnosis not present

## 2017-05-30 DIAGNOSIS — I10 Essential (primary) hypertension: Secondary | ICD-10-CM

## 2017-05-30 DIAGNOSIS — G4733 Obstructive sleep apnea (adult) (pediatric): Secondary | ICD-10-CM | POA: Diagnosis not present

## 2017-05-30 DIAGNOSIS — Z125 Encounter for screening for malignant neoplasm of prostate: Secondary | ICD-10-CM

## 2017-05-30 DIAGNOSIS — F419 Anxiety disorder, unspecified: Secondary | ICD-10-CM | POA: Diagnosis not present

## 2017-05-30 DIAGNOSIS — R7303 Prediabetes: Secondary | ICD-10-CM | POA: Diagnosis not present

## 2017-05-30 DIAGNOSIS — N400 Enlarged prostate without lower urinary tract symptoms: Secondary | ICD-10-CM

## 2017-05-30 LAB — CBC WITH DIFFERENTIAL/PLATELET
BASOS ABS: 0 10*3/uL (ref 0.0–0.1)
Basophils Relative: 1 % (ref 0.0–3.0)
EOS PCT: 1.3 % (ref 0.0–5.0)
Eosinophils Absolute: 0.1 10*3/uL (ref 0.0–0.7)
HEMATOCRIT: 47.1 % (ref 39.0–52.0)
Hemoglobin: 16 g/dL (ref 13.0–17.0)
LYMPHS PCT: 39.4 % (ref 12.0–46.0)
Lymphs Abs: 1.7 10*3/uL (ref 0.7–4.0)
MCHC: 34 g/dL (ref 30.0–36.0)
MCV: 90.3 fl (ref 78.0–100.0)
MONOS PCT: 9.2 % (ref 3.0–12.0)
Monocytes Absolute: 0.4 10*3/uL (ref 0.1–1.0)
NEUTROS ABS: 2.1 10*3/uL (ref 1.4–7.7)
Neutrophils Relative %: 49.1 % (ref 43.0–77.0)
Platelets: 205 10*3/uL (ref 150.0–400.0)
RBC: 5.22 Mil/uL (ref 4.22–5.81)
RDW: 13.2 % (ref 11.5–15.5)
WBC: 4.4 10*3/uL (ref 4.0–10.5)

## 2017-05-30 LAB — TSH: TSH: 4.33 u[IU]/mL (ref 0.35–4.50)

## 2017-05-30 LAB — COMPREHENSIVE METABOLIC PANEL
ALT: 34 U/L (ref 0–53)
AST: 24 U/L (ref 0–37)
Albumin: 4.6 g/dL (ref 3.5–5.2)
Alkaline Phosphatase: 72 U/L (ref 39–117)
BILIRUBIN TOTAL: 1 mg/dL (ref 0.2–1.2)
BUN: 15 mg/dL (ref 6–23)
CALCIUM: 9.8 mg/dL (ref 8.4–10.5)
CHLORIDE: 102 meq/L (ref 96–112)
CO2: 29 meq/L (ref 19–32)
Creatinine, Ser: 1.06 mg/dL (ref 0.40–1.50)
GFR: 74.79 mL/min (ref >60.00)
GLUCOSE: 90 mg/dL (ref 70–99)
POTASSIUM: 4 meq/L (ref 3.5–5.1)
Sodium: 139 mEq/L (ref 135–145)
Total Protein: 7.4 g/dL (ref 6.0–8.3)

## 2017-05-30 LAB — LIPID PANEL
Cholesterol: 167 mg/dL (ref 0–200)
HDL: 42.8 mg/dL (ref >39.00)
LDL CALC: 97 mg/dL (ref 0–99)
NONHDL: 123.82
TRIGLYCERIDES: 133 mg/dL (ref 0.0–149.0)
Total CHOL/HDL Ratio: 4
VLDL: 26.6 mg/dL (ref 0.0–40.0)

## 2017-05-30 LAB — HEMOGLOBIN A1C: Hgb A1c MFr Bld: 5.6 % (ref 4.6–6.5)

## 2017-05-30 MED ORDER — FLUOXETINE HCL 10 MG PO TABS: 10.0000 mg | ORAL_TABLET | Freq: Every day | ORAL | 3 refills | Status: DC

## 2017-05-30 MED ORDER — VALSARTAN 80 MG PO TABS: 80.0000 mg | ORAL_TABLET | Freq: Every day | ORAL | 3 refills | Status: DC

## 2017-05-30 NOTE — Assessment & Plan Note (Signed)
Check lipid panel  Regular exercise and healthy diet encouraged  

## 2017-05-30 NOTE — Assessment & Plan Note (Signed)
Asymptomatic No medication needed  psa

## 2017-05-30 NOTE — Assessment & Plan Note (Signed)
BP well controlled Current regimen effective and well tolerated Continue current medications at current doses Cmp, tsh 

## 2017-05-30 NOTE — Assessment & Plan Note (Signed)
Fairly controlled ? Need an increase in dose Continue current dose of prozac for now - he will let me know if he wants to increase to 20 mg

## 2017-05-30 NOTE — Assessment & Plan Note (Addendum)
Check a1c Low sugar / carb diet Stressed regular exercise   

## 2017-05-30 NOTE — Assessment & Plan Note (Signed)
Uses cpap nightly  

## 2017-06-02 ENCOUNTER — Encounter: Payer: Self-pay | Admitting: Internal Medicine

## 2017-06-02 LAB — PSA, TOTAL AND FREE
PSA, % Free: 31 % (calc) (ref >25)
PSA, FREE: 0.5 ng/mL
PSA, Total: 1.6 ng/mL (ref <=4.0)

## 2017-06-27 DIAGNOSIS — H2513 Age-related nuclear cataract, bilateral: Secondary | ICD-10-CM | POA: Diagnosis not present

## 2017-06-27 DIAGNOSIS — H25013 Cortical age-related cataract, bilateral: Secondary | ICD-10-CM | POA: Diagnosis not present

## 2017-06-27 DIAGNOSIS — H3509 Other intraretinal microvascular abnormalities: Secondary | ICD-10-CM | POA: Diagnosis not present

## 2017-11-28 DIAGNOSIS — Z23 Encounter for immunization: Secondary | ICD-10-CM | POA: Diagnosis not present

## 2017-12-02 DIAGNOSIS — L821 Other seborrheic keratosis: Secondary | ICD-10-CM | POA: Diagnosis not present

## 2017-12-02 DIAGNOSIS — L570 Actinic keratosis: Secondary | ICD-10-CM | POA: Diagnosis not present

## 2017-12-02 DIAGNOSIS — D225 Melanocytic nevi of trunk: Secondary | ICD-10-CM | POA: Diagnosis not present

## 2018-03-04 DIAGNOSIS — G4733 Obstructive sleep apnea (adult) (pediatric): Secondary | ICD-10-CM | POA: Diagnosis not present

## 2018-05-31 NOTE — Patient Instructions (Addendum)
Tests ordered today. Your results will be released to MyChart (or called to you) after review, usually within 72hours after test completion. If any changes need to be made, you will be notified at that same time.  All other Health Maintenance issues reviewed.   All recommended immunizations and age-appropriate screenings are up-to-date or discussed.  No immunizations administered today.   Medications reviewed and updated.  Changes include :   none  Your prescription(s) have been submitted to your pharmacy. Please take as directed and contact our office if you believe you are having problem(s) with the medication(s).   Please followup in one year    Health Maintenance, Male A healthy lifestyle and preventive care is important for your health and wellness. Ask your health care provider about what schedule of regular examinations is right for you. What should I know about weight and diet? Eat a Healthy Diet  Eat plenty of vegetables, fruits, whole grains, low-fat dairy products, and lean protein.  Do not eat a lot of foods high in solid fats, added sugars, or salt.  Maintain a Healthy Weight Regular exercise can help you achieve or maintain a healthy weight. You should:  Do at least 150 minutes of exercise each week. The exercise should increase your heart rate and make you sweat (moderate-intensity exercise).  Do strength-training exercises at least twice a week. Watch Your Levels of Cholesterol and Blood Lipids  Have your blood tested for lipids and cholesterol every 5 years starting at 65 years of age. If you are at high risk for heart disease, you should start having your blood tested when you are 65 years old. You may need to have your cholesterol levels checked more often if: ? Your lipid or cholesterol levels are high. ? You are older than 65 years of age. ? You are at high risk for heart disease. What should I know about cancer screening? Many types of cancers can be  detected early and may often be prevented. Lung Cancer  You should be screened every year for lung cancer if: ? You are a current smoker who has smoked for at least 30 years. ? You are a former smoker who has quit within the past 15 years.  Talk to your health care provider about your screening options, when you should start screening, and how often you should be screened. Colorectal Cancer  Routine colorectal cancer screening usually begins at 65 years of age and should be repeated every 5-10 years until you are 65 years old. You may need to be screened more often if early forms of precancerous polyps or small growths are found. Your health care provider may recommend screening at an earlier age if you have risk factors for colon cancer.  Your health care provider may recommend using home test kits to check for hidden blood in the stool.  A small camera at the end of a tube can be used to examine your colon (sigmoidoscopy or colonoscopy). This checks for the earliest forms of colorectal cancer. Prostate and Testicular Cancer  Depending on your age and overall health, your health care provider may do certain tests to screen for prostate and testicular cancer.  Talk to your health care provider about any symptoms or concerns you have about testicular or prostate cancer. Skin Cancer  Check your skin from head to toe regularly.  Tell your health care provider about any new moles or changes in moles, especially if: ? There is a change in a mole's size,   shape, or color. ? You have a mole that is larger than a pencil eraser.  Always use sunscreen. Apply sunscreen liberally and repeat throughout the day.  Protect yourself by wearing long sleeves, pants, a wide-brimmed hat, and sunglasses when outside. What should I know about heart disease, diabetes, and high blood pressure?  If you are 18-39 years of age, have your blood pressure checked every 3-5 years. If you are 40 years of age or  older, have your blood pressure checked every year. You should have your blood pressure measured twice-once when you are at a hospital or clinic, and once when you are not at a hospital or clinic. Record the average of the two measurements. To check your blood pressure when you are not at a hospital or clinic, you can use: ? An automated blood pressure machine at a pharmacy. ? A home blood pressure monitor.  Talk to your health care provider about your target blood pressure.  If you are between 45-79 years old, ask your health care provider if you should take aspirin to prevent heart disease.  Have regular diabetes screenings by checking your fasting blood sugar level. ? If you are at a normal weight and have a low risk for diabetes, have this test once every three years after the age of 45. ? If you are overweight and have a high risk for diabetes, consider being tested at a younger age or more often.  A one-time screening for abdominal aortic aneurysm (AAA) by ultrasound is recommended for men aged 65-75 years who are current or former smokers. What should I know about preventing infection? Hepatitis B If you have a higher risk for hepatitis B, you should be screened for this virus. Talk with your health care provider to find out if you are at risk for hepatitis B infection. Hepatitis C Blood testing is recommended for:  Everyone born from 1945 through 1965.  Anyone with known risk factors for hepatitis C. Sexually Transmitted Diseases (STDs)  You should be screened each year for STDs including gonorrhea and chlamydia if: ? You are sexually active and are younger than 65 years of age. ? You are older than 65 years of age and your health care provider tells you that you are at risk for this type of infection. ? Your sexual activity has changed since you were last screened and you are at an increased risk for chlamydia or gonorrhea. Ask your health care provider if you are at risk.  Talk  with your health care provider about whether you are at high risk of being infected with HIV. Your health care provider may recommend a prescription medicine to help prevent HIV infection. What else can I do?  Schedule regular health, dental, and eye exams.  Stay current with your vaccines (immunizations).  Do not use any tobacco products, such as cigarettes, chewing tobacco, and e-cigarettes. If you need help quitting, ask your health care provider.  Limit alcohol intake to no more than 2 drinks per day. One drink equals 12 ounces of beer, 5 ounces of wine, or 1 ounces of hard liquor.  Do not use street drugs.  Do not share needles.  Ask your health care provider for help if you need support or information about quitting drugs.  Tell your health care provider if you often feel depressed.  Tell your health care provider if you have ever been abused or do not feel safe at home. This information is not intended to replace advice   given to you by your health care provider. Make sure you discuss any questions you have with your health care provider. Document Released: 09/07/2007 Document Revised: 11/08/2015 Document Reviewed: 12/13/2014 Elsevier Interactive Patient Education  2019 [employer]  

## 2018-05-31 NOTE — Progress Notes (Signed)
Subjective:    Patient ID: Steven Wolf, male    DOB: 06/05/1955, 63 y.o.   MRN: [id number]  HPI He is here for a physical exam.   He retired officially two weeks ago.  He denies changes in his health and has no major concerns.     Medications and allergies reviewed with patient and updated if appropriate.  Patient Active Problem List   Diagnosis Date Noted  . OSA on CPAP 06/28/2016  . Anxiety 04/07/2015  . Prediabetes 11/11/2013  . DIVERTICULOSIS, COLON 08/11/2009  . BPH (benign prostatic hyperplasia) 06/15/2008  . HEMATURIA 05/06/2008  . Hyperlipidemia 09/10/2007  . Essential hypertension, benign 04/17/2007  . COLONIC POLYPS, HX OF 04/17/2007  . SYNCOPE 04/03/2007    Current Outpatient Medications on File Prior to Visit  Medication Sig Dispense Refill  . aspirin 81 MG tablet Take 81 mg by mouth daily.      . Cholecalciferol (VITAMIN D) 2000 UNITS CAPS Take 2,000 Units by mouth daily.     . Coenzyme Q10 (COQ10) 100 MG CAPS Take 1 tablet by mouth daily.     . fish oil-omega-3 fatty acids 1000 MG capsule Take 1 g by mouth 2 (two) times daily.     Marland Kitchen FLUoxetine (PROZAC) 10 MG tablet Take 1 tablet (10 mg total) by mouth daily. 90 tablet 3  . OMEGA-3 KRILL OIL PO Take 1 capsule by mouth daily.    . valsartan (DIOVAN) 80 MG tablet Take 1 tablet (80 mg total) by mouth daily. 90 tablet 3   No current facility-administered medications on file prior to visit.     Past Medical History:  Diagnosis Date  . Anxiety   . Benign prostatic hypertrophy   . Diverticulosis of colon   . Gilbert syndrome   . Hyperlipidemia   . Hypertension   . Microscopic hematuria   . Nephrolithiasis     X 2; Dr Serita Butcher  . Syncope 02/2007   NOCTURNAL POST [place]    Past Surgical History:  Procedure Laterality Date  . APPENDECTOMY    . COLONOSCOPY W/ POLYPECTOMY  2002   Tics 2005 & 2010; due 2020, Dr Sharlett Iles  . LIVER BIOPSY     X 1 for elevated LFTs with NEGATIVE  hepatitis  serologies  . TONSILLECTOMY      Social History   Socioeconomic History  . Marital status: Married    Spouse name: Not on file  . Number of children: Not on file  . Years of education: Not on file  . Highest education level: Not on file  Occupational History  . Not on file  Social Needs  . Financial resource strain: Not on file  . Food insecurity:    Worry: Not on file    Inability: Not on file  . Transportation needs:    Medical: Not on file    Non-medical: Not on file  Tobacco Use  . Smoking status: Never Smoker  . Smokeless tobacco: Never Used  . Tobacco comment: Second hand smoke  Substance and Sexual Activity  . Alcohol use: No  . Drug use: No  . Sexual activity: Not on file  Lifestyle  . Physical activity:    Days per week: Not on file    Minutes per session: Not on file  . Stress: Not on file  Relationships  . Social connections:    Talks on phone: Not on file    Gets together: Not on file    Attends religious  service: Not on file    Active member of club or organization: Not on file    Attends meetings of clubs or organizations: Not on file    Relationship status: Not on file  Other Topics Concern  . Not on file  Social History Narrative  . Not on file    Family History  Problem Relation Age of Onset  . Breast cancer Mother   . Rheumatologic disease Mother        RA  . Heart disease Father        PCV;MI ? 23  . Polycythemia Father   . Leukemia Paternal Uncle   . Polycythemia Paternal Uncle   . Heart attack Paternal Grandfather        > 55  . Stroke Maternal Grandmother        . 65  . Sleep apnea Sister   . Diabetes Neg Hx     Review of Systems  Constitutional: Negative for chills, fatigue and fever.  Eyes: Negative for visual disturbance.  Respiratory: Negative for cough, shortness of breath and wheezing.   Cardiovascular: Negative for chest pain, palpitations and leg swelling.  Gastrointestinal: Positive for anal bleeding (hemorrhoids).  Negative for abdominal pain, blood in stool, constipation, diarrhea and nausea.       No gerd  Genitourinary: Negative for difficulty urinating, dysuria and hematuria.  Musculoskeletal: Positive for arthralgias (mild finger pain). Negative for back pain.  Skin: Negative for color change and rash.  Neurological: Negative for dizziness, light-headedness, numbness and headaches.  Psychiatric/Behavioral: Negative for dysphoric mood. The patient is nervous/anxious.        Objective:   Vitals:   06/02/18 0816  BP: 118/80  Pulse: 66  Resp: 16  Temp: 98.6 F (37 C)  SpO2: 99%   Filed Weights   06/02/18 0816  Weight: 183 lb 12.8 oz (83.4 kg)   Body mass index is 24.93 kg/m.  Wt Readings from Last 3 Encounters:  06/02/18 183 lb 12.8 oz (83.4 kg)  05/30/17 186 lb (84.4 kg)  06/28/16 184 lb (83.5 kg)     Physical Exam Constitutional: He appears well-developed and well-nourished. No distress.  HENT:  Head: Normocephalic and atraumatic.  Right Ear: External ear normal.  Left Ear: External ear normal.  Mouth/Throat: Oropharynx is clear and moist.  Normal ear canals and TM b/l  Eyes: Conjunctivae and EOM are normal.  Neck: Neck supple. No tracheal deviation present. No thyromegaly present. No carotid bruit  Cardiovascular: Normal rate, regular rhythm, normal heart sounds and intact distal pulses.  No murmur heard. Pulmonary/Chest: Effort normal and breath sounds normal. No respiratory distress. He has no wheezes. He has no rales.  Abdominal: Soft. He exhibits no distension. There is no tenderness.  Genitourinary: deferred  Musculoskeletal: He exhibits no edema.  Lymphadenopathy:   He has no cervical adenopathy.  Skin: Skin is warm and dry. He is not diaphoretic.  Psychiatric: He has a normal mood and affect. His behavior is normal.         Assessment & Plan:   Physical exam: Screening blood work  ordered Immunizations   Discussed shingrix, others up to date Colonoscopy    Up to date  Eye exams    Up to date  EKG   2015 Exercise   walking Weight   Normal BMI Skin   No concerns Substance abuse  none  See Problem List for Assessment and Plan of chronic medical problems.   FU annually

## 2018-06-02 ENCOUNTER — Other Ambulatory Visit (INDEPENDENT_AMBULATORY_CARE_PROVIDER_SITE_OTHER): Payer: 59

## 2018-06-02 ENCOUNTER — Encounter: Payer: Self-pay | Admitting: Internal Medicine

## 2018-06-02 ENCOUNTER — Ambulatory Visit (INDEPENDENT_AMBULATORY_CARE_PROVIDER_SITE_OTHER): Payer: 59 | Admitting: Internal Medicine

## 2018-06-02 VITALS — BP 118/80 | HR 66 | Temp 98.6°F | Resp 16 | Ht 72.0 in | Wt 183.8 lb

## 2018-06-02 DIAGNOSIS — F419 Anxiety disorder, unspecified: Secondary | ICD-10-CM

## 2018-06-02 DIAGNOSIS — Z0000 Encounter for general adult medical examination without abnormal findings: Secondary | ICD-10-CM

## 2018-06-02 DIAGNOSIS — E7849 Other hyperlipidemia: Secondary | ICD-10-CM

## 2018-06-02 DIAGNOSIS — I10 Essential (primary) hypertension: Secondary | ICD-10-CM

## 2018-06-02 DIAGNOSIS — R7303 Prediabetes: Secondary | ICD-10-CM

## 2018-06-02 DIAGNOSIS — Z9989 Dependence on other enabling machines and devices: Secondary | ICD-10-CM

## 2018-06-02 DIAGNOSIS — G4733 Obstructive sleep apnea (adult) (pediatric): Secondary | ICD-10-CM

## 2018-06-02 LAB — COMPREHENSIVE METABOLIC PANEL
ALT: 31 U/L (ref 0–53)
AST: 25 U/L (ref 0–37)
Albumin: 4.8 g/dL (ref 3.5–5.2)
Alkaline Phosphatase: 69 U/L (ref 39–117)
BUN: 20 mg/dL (ref 6–23)
CO2: 28 mEq/L (ref 19–32)
Calcium: 9.5 mg/dL (ref 8.4–10.5)
Chloride: 104 mEq/L (ref 96–112)
Creatinine, Ser: 1.18 mg/dL (ref 0.40–1.50)
GFR: 61.98 mL/min (ref >60.00)
Glucose, Bld: 90 mg/dL (ref 70–99)
Potassium: 4 mEq/L (ref 3.5–5.1)
Sodium: 140 mEq/L (ref 135–145)
Total Bilirubin: 1.1 mg/dL (ref 0.2–1.2)
Total Protein: 7.4 g/dL (ref 6.0–8.3)

## 2018-06-02 LAB — CBC WITH DIFFERENTIAL/PLATELET
Basophils Absolute: 0.1 10*3/uL (ref 0.0–0.1)
Basophils Relative: 2.7 % (ref 0.0–3.0)
EOS ABS: 0.1 10*3/uL (ref 0.0–0.7)
Eosinophils Relative: 2.1 % (ref 0.0–5.0)
HCT: 47 % (ref 39.0–52.0)
Hemoglobin: 16 g/dL (ref 13.0–17.0)
Lymphocytes Relative: 38.2 % (ref 12.0–46.0)
Lymphs Abs: 1.7 10*3/uL (ref 0.7–4.0)
MCHC: 34 g/dL (ref 30.0–36.0)
MCV: 91.4 fl (ref 78.0–100.0)
Monocytes Absolute: 0.5 10*3/uL (ref 0.1–1.0)
Monocytes Relative: 11.9 % (ref 3.0–12.0)
NEUTROS PCT: 45.1 % (ref 43.0–77.0)
Neutro Abs: 2 10*3/uL (ref 1.4–7.7)
Platelets: 205 10*3/uL (ref 150.0–400.0)
RBC: 5.14 Mil/uL (ref 4.22–5.81)
RDW: 13.1 % (ref 11.5–15.5)
WBC: 4.5 10*3/uL (ref 4.0–10.5)

## 2018-06-02 LAB — LIPID PANEL
CHOLESTEROL: 194 mg/dL (ref 0–200)
HDL: 49.2 mg/dL (ref >39.00)
LDL Cholesterol: 123 mg/dL — ABNORMAL HIGH (ref 0–99)
NonHDL: 145.21
Total CHOL/HDL Ratio: 4
Triglycerides: 111 mg/dL (ref 0.0–149.0)
VLDL: 22.2 mg/dL (ref 0.0–40.0)

## 2018-06-02 LAB — TSH: TSH: 4.46 u[IU]/mL (ref 0.35–4.50)

## 2018-06-02 LAB — HEMOGLOBIN A1C: HEMOGLOBIN A1C: 5.5 % (ref 4.6–6.5)

## 2018-06-02 MED ORDER — VALSARTAN 80 MG PO TABS: 80.0000 mg | ORAL_TABLET | Freq: Every day | ORAL | 3 refills | Status: DC

## 2018-06-02 MED ORDER — FLUOXETINE HCL 10 MG PO TABS: 10.0000 mg | ORAL_TABLET | Freq: Every day | ORAL | 3 refills | Status: DC

## 2018-06-02 NOTE — Assessment & Plan Note (Addendum)
Check lipid panel, tsh, cmp Controlled with lifestyle Regular exercise and healthy diet encouraged

## 2018-06-02 NOTE — Assessment & Plan Note (Signed)
BP well controlled Current regimen effective and well tolerated Continue current medications at current doses cmp  

## 2018-06-02 NOTE — Assessment & Plan Note (Signed)
Controlled, stable Continue current dose of medication - prozac 10 mg daily

## 2018-06-02 NOTE — Assessment & Plan Note (Addendum)
Using cpap nightly Wants to change mask to full mask ( nasal mask currently used) -- rx given

## 2018-06-02 NOTE — Assessment & Plan Note (Signed)
Check a1c Low sugar / carb diet Stressed regular exercise   

## 2018-06-03 LAB — PSA, TOTAL AND FREE
PSA, % Free: 38 % (calc) (ref >25)
PSA, FREE: 0.5 ng/mL
PSA, TOTAL: 1.3 ng/mL (ref <=4.0)

## 2018-06-04 ENCOUNTER — Encounter: Payer: Self-pay | Admitting: Internal Medicine

## 2018-09-01 DIAGNOSIS — R69 Illness, unspecified: Secondary | ICD-10-CM | POA: Diagnosis not present

## 2018-11-12 NOTE — Progress Notes (Signed)
Subjective:    Patient ID: Steven Wolf, male    DOB: [birth year], 63 y.o.   MRN: [id number]  HPI The patient is here for an acute visit.   Pain in left index finger:  He has pain in his PIP joint in his left index finger.  He first noticed it in May.  It is better some days than others.  It hurts more with using it.   It is worse first thing in the morning.  Once he loosens it up it is better.  There is a non-tender bump on one part of the joint - it has gotten larger.  No other swelling in the joint.  He denies redness, numbness/tingling. He has not taken anything for it.    Most of his other fingers ache when really over worked, but nothing like that one finger.    No family history of arthritis.   Medications and allergies reviewed with patient and updated if appropriate.  Patient Active Problem List   Diagnosis Date Noted  . OSA on CPAP 06/28/2016  . Anxiety 04/07/2015  . Prediabetes 11/11/2013  . DIVERTICULOSIS, COLON 08/11/2009  . BPH (benign prostatic hyperplasia) 06/15/2008  . HEMATURIA 05/06/2008  . Hyperlipidemia 09/10/2007  . Essential hypertension, benign 04/17/2007  . COLONIC POLYPS, HX OF 04/17/2007  . SYNCOPE 04/03/2007    Current Outpatient Medications on File Prior to Visit  Medication Sig Dispense Refill  . aspirin 81 MG tablet Take 81 mg by mouth daily.      . Cholecalciferol (VITAMIN D) 2000 UNITS CAPS Take 2,000 Units by mouth daily.     . Coenzyme Q10 (COQ10) 100 MG CAPS Take 1 tablet by mouth daily.     . fish oil-omega-3 fatty acids 1000 MG capsule Take 1 g by mouth 2 (two) times daily.     Marland Kitchen FLUoxetine (PROZAC) 10 MG tablet Take 1 tablet (10 mg total) by mouth daily. 90 tablet 3  . OMEGA-3 KRILL OIL PO Take 1 capsule by mouth daily.    . valsartan (DIOVAN) 80 MG tablet Take 1 tablet (80 mg total) by mouth daily. 90 tablet 3   No current facility-administered medications on file prior to visit.     Past Medical History:  Diagnosis Date  .  Anxiety   . Benign prostatic hypertrophy   . Diverticulosis of colon   . Gilbert syndrome   . Hyperlipidemia   . Hypertension   . Microscopic hematuria   . Nephrolithiasis     X 2; Dr Serita Butcher  . Syncope 02/2007   NOCTURNAL POST [place]    Past Surgical History:  Procedure Laterality Date  . APPENDECTOMY    . COLONOSCOPY W/ POLYPECTOMY  2002   Tics 2005 & 2010; due 2020, Dr Sharlett Iles  . LIVER BIOPSY     X 1 for elevated LFTs with NEGATIVE  hepatitis serologies  . TONSILLECTOMY      Social History   Socioeconomic History  . Marital status: Married    Spouse name: Not on file  . Number of children: Not on file  . Years of education: Not on file  . Highest education level: Not on file  Occupational History  . Not on file  Social Needs  . Financial resource strain: Not on file  . Food insecurity    Worry: Not on file    Inability: Not on file  . Transportation needs    Medical: Not on file    Non-medical: Not on  file  Tobacco Use  . Smoking status: Never Smoker  . Smokeless tobacco: Never Used  . Tobacco comment: Second hand smoke  Substance and Sexual Activity  . Alcohol use: No  . Drug use: No  . Sexual activity: Not on file  Lifestyle  . Physical activity    Days per week: Not on file    Minutes per session: Not on file  . Stress: Not on file  Relationships  . Social Herbalist on phone: Not on file    Gets together: Not on file    Attends religious service: Not on file    Active member of club or organization: Not on file    Attends meetings of clubs or organizations: Not on file    Relationship status: Not on file  Other Topics Concern  . Not on file  Social History Narrative  . Not on file    Family History  Problem Relation Age of Onset  . Breast cancer Mother   . Rheumatologic disease Mother        RA  . Heart disease Father        PCV;MI ? 26  . Polycythemia Father   . Leukemia Paternal Uncle   . Polycythemia Paternal  Uncle   . Heart attack Paternal Grandfather        > 55  . Stroke Maternal Grandmother        . 65  . Sleep apnea Sister   . Diabetes Neg Hx     Review of Systems  Constitutional: Negative for chills and fever.  Musculoskeletal: Positive for arthralgias. Negative for joint swelling.  Neurological: Negative for numbness.       Objective:   Vitals:   11/13/18 1352  BP: 116/78  Pulse: 97  Resp: 16  Temp: 98.9 F (37.2 C)  SpO2: 95%   BP Readings from Last 3 Encounters:  11/13/18 116/78  06/02/18 118/80  05/30/17 124/80   Wt Readings from Last 3 Encounters:  11/13/18 184 lb 12.8 oz (83.8 kg)  06/02/18 183 lb 12.8 oz (83.4 kg)  05/30/17 186 lb (84.4 kg)   Body mass index is 25.06 kg/m.   Physical Exam Constitutional:      Appearance: Normal appearance.  Musculoskeletal:     Comments: Ganglion cyst medial aspect of left index finger at PIP joint - nontender.  No joint swelling or pain with active or passive movement.  Normal ROM.  No joint deformities in hand.  No hand joint swelling.    Skin:    General: Skin is warm and dry.     Findings: No erythema or rash.  Neurological:     Mental Status: He is alert.            Assessment & Plan:    See Problem List for Assessment and Plan of chronic medical problems.

## 2018-11-13 ENCOUNTER — Encounter: Payer: Self-pay | Admitting: Internal Medicine

## 2018-11-13 ENCOUNTER — Ambulatory Visit (INDEPENDENT_AMBULATORY_CARE_PROVIDER_SITE_OTHER): Payer: Medicare HMO | Admitting: Internal Medicine

## 2018-11-13 ENCOUNTER — Other Ambulatory Visit: Payer: Self-pay

## 2018-11-13 ENCOUNTER — Ambulatory Visit (INDEPENDENT_AMBULATORY_CARE_PROVIDER_SITE_OTHER)
Admission: RE | Admit: 2018-11-13 | Discharge: 2018-11-13 | Disposition: A | Discharge status: Home / Self Care | Payer: Medicare HMO | Source: Ambulatory Visit | Attending: Internal Medicine | Admitting: Internal Medicine

## 2018-11-13 VITALS — BP 116/78 | HR 97 | Temp 98.9°F | Resp 16 | Ht 72.0 in | Wt 184.8 lb

## 2018-11-13 DIAGNOSIS — M79645 Pain in left finger(s): Secondary | ICD-10-CM | POA: Insufficient documentation

## 2018-11-13 DIAGNOSIS — S6992XA Unspecified injury of left wrist, hand and finger(s), initial encounter: Secondary | ICD-10-CM | POA: Diagnosis not present

## 2018-11-13 DIAGNOSIS — Z23 Encounter for immunization: Secondary | ICD-10-CM

## 2018-11-13 DIAGNOSIS — M7989 Other specified soft tissue disorders: Secondary | ICD-10-CM | POA: Diagnosis not present

## 2018-11-13 NOTE — Patient Instructions (Signed)
Have an xray today of your left hand.    Treat symptomatically.    The bump is likely a ganglion cyst.    A good dermatologist - Dr Jarome Matin at [facility].

## 2018-11-14 ENCOUNTER — Encounter: Payer: Self-pay | Admitting: Internal Medicine

## 2018-11-14 NOTE — Assessment & Plan Note (Signed)
Probable OA Will check xray Symptomatic treatment discussed

## 2018-12-02 DIAGNOSIS — R69 Illness, unspecified: Secondary | ICD-10-CM | POA: Diagnosis not present

## 2018-12-10 ENCOUNTER — Ambulatory Visit: Payer: Medicare HMO

## 2018-12-30 ENCOUNTER — Encounter: Payer: Self-pay | Admitting: Gastroenterology

## 2019-01-20 DIAGNOSIS — R69 Illness, unspecified: Secondary | ICD-10-CM | POA: Diagnosis not present

## 2019-04-21 ENCOUNTER — Other Ambulatory Visit: Payer: Self-pay | Admitting: Internal Medicine

## 2019-04-21 DIAGNOSIS — I10 Essential (primary) hypertension: Secondary | ICD-10-CM

## 2019-05-14 ENCOUNTER — Encounter: Payer: Self-pay | Admitting: Gastroenterology

## 2019-05-27 ENCOUNTER — Other Ambulatory Visit: Payer: Self-pay

## 2019-05-27 ENCOUNTER — Ambulatory Visit (AMBULATORY_SURGERY_CENTER): Payer: Medicare HMO

## 2019-05-27 VITALS — Ht 72.0 in | Wt 175.0 lb

## 2019-05-27 DIAGNOSIS — Z1211 Encounter for screening for malignant neoplasm of colon: Secondary | ICD-10-CM

## 2019-05-27 NOTE — Progress Notes (Signed)
No egg or soy allergy known to patient  No issues with past sedation with any surgeries  or procedures, no intubation problems  No diet pills per patient No home 02 use per patient  No blood thinners per patient  Pt denies issues with constipation  No A fib or A flutter  EMMI video sent to pt's e mail   Pt had both covid vaccines, no need for covid test  VIRTUAL PREVISIT  Due to the COVID-19 pandemic we are asking patients to follow these guidelines. Please only bring one care partner. Please be aware that your care partner may wait in the car in the parking lot or if they feel like they will be too hot to wait in the car, they may wait in the lobby on the 4th floor. All care partners are required to wear a mask the entire time (we do not have any that we can provide them), they need to practice social distancing, and we will do a Covid check for all patient's and care partners when you arrive. Also we will check their temperature and your temperature. If the care partner waits in their car they need to stay in the parking lot the entire time and we will call them on their cell phone when the patient is ready for discharge so they can bring the car to the front of the building. Also all patient's will need to wear a mask into building.

## 2019-06-03 NOTE — Progress Notes (Signed)
Subjective:    Patient ID: Steven Wolf, male    DOB: 1955/11/30, 64 y.o.   MRN: [id number]  HPI He is here for a physical exam.   He denies any changes in his health.  He has no major concerns.    Medications and allergies reviewed with patient and updated if appropriate.  Patient Active Problem List   Diagnosis Date Noted  . Pain in finger of left hand 11/13/2018  . OSA on CPAP 06/28/2016  . Anxiety 04/07/2015  . Prediabetes 11/11/2013  . DIVERTICULOSIS, COLON 08/11/2009  . BPH (benign prostatic hyperplasia) 06/15/2008  . HEMATURIA 05/06/2008  . Hyperlipidemia 09/10/2007  . Essential hypertension, benign 04/17/2007  . COLONIC POLYPS, HX OF 04/17/2007  . SYNCOPE 04/03/2007    Current Outpatient Medications on File Prior to Visit  Medication Sig Dispense Refill  . aspirin 81 MG tablet Take 81 mg by mouth daily.      . Cholecalciferol (VITAMIN D) 2000 UNITS CAPS Take 2,000 Units by mouth daily.     . Coenzyme Q10 (COQ10) 100 MG CAPS Take 1 tablet by mouth daily.     . fish oil-omega-3 fatty acids 1000 MG capsule Take 1 g by mouth 2 (two) times daily.     Marland Kitchen FLUoxetine (PROZAC) 10 MG tablet TAKE 1 TABLET BY MOUTH EVERY DAY 90 tablet 0  . OMEGA-3 KRILL OIL PO Take 1 capsule by mouth daily.    . valsartan (DIOVAN) 80 MG tablet TAKE 1 TABLET BY MOUTH EVERY DAY 90 tablet 0   No current facility-administered medications on file prior to visit.    Past Medical History:  Diagnosis Date  . Anxiety   . Benign prostatic hypertrophy   . Diverticulosis of colon   . Gilbert syndrome   . Hypertension   . Microscopic hematuria   . Nephrolithiasis     X 2; Dr Serita Butcher  . Sleep apnea    USES CPAP  . Syncope 02/2007   NOCTURNAL POST [place]    Past Surgical History:  Procedure Laterality Date  . APPENDECTOMY    . COLONOSCOPY  2010   NORMAL(pt states 2 other colonoscopies in the past)  . COLONOSCOPY W/ POLYPECTOMY  2002   Tics 2005 & 2010; due 2020, Dr Sharlett Iles    . LIVER BIOPSY     X 1 for elevated LFTs with NEGATIVE  hepatitis serologies  . TONSILLECTOMY    . TONSILLECTOMY     PT STATES HAS HAD IT TWICE  . UPPER GASTROINTESTINAL ENDOSCOPY  1995    Social History   Socioeconomic History  . Marital status: Married    Spouse name: Not on file  . Number of children: Not on file  . Years of education: Not on file  . Highest education level: Not on file  Occupational History  . Not on file  Tobacco Use  . Smoking status: Never Smoker  . Smokeless tobacco: Never Used  . Tobacco comment: Second hand smoke  Substance and Sexual Activity  . Alcohol use: No  . Drug use: No  . Sexual activity: Not on file  Other Topics Concern  . Not on file  Social History Narrative  . Not on file   Social Determinants of Health   Financial Resource Strain:   . Difficulty of Paying Living Expenses:   Food Insecurity:   . Worried About Charity fundraiser in the Last Year:   . [place] in the Last Year:  Transportation Needs:   . Film/video editor (Medical):   Marland Kitchen Lack of Transportation (Non-Medical):   Physical Activity:   . Days of Exercise per Week:   . Minutes of Exercise per Session:   Stress:   . Feeling of Stress :   Social Connections:   . Frequency of Communication with Friends and Family:   . Frequency of Social Gatherings with Friends and Family:   . Attends Religious Services:   . Active Member of Clubs or Organizations:   . Attends Archivist Meetings:   Marland Kitchen Marital Status:     Family History  Problem Relation Age of Onset  . Breast cancer Mother   . Rheumatologic disease Mother        RA  . Heart disease Father        PCV;MI ? 80  . Polycythemia Father   . Leukemia Paternal Uncle   . Polycythemia Paternal Uncle   . Heart attack Paternal Grandfather        > 55  . Stroke Maternal Grandmother        . 65  . Sleep apnea Sister   . Diabetes Neg Hx   . Colon cancer Neg Hx   . Colon polyps Neg Hx   .  Esophageal cancer Neg Hx   . Stomach cancer Neg Hx   . Rectal cancer Neg Hx     Review of Systems  Constitutional: Negative for chills, fatigue and fever.  Eyes: Negative for visual disturbance (blurry vision R eye ).  Respiratory: Negative for cough, shortness of breath and wheezing.   Cardiovascular: Negative for chest pain, palpitations and leg swelling.  Gastrointestinal: Positive for anal bleeding (intermittent hemorhoidal bleed) and constipation. Negative for abdominal pain, blood in stool, diarrhea and nausea.       No gerd  Genitourinary: Negative for difficulty urinating, dysuria and hematuria.  Musculoskeletal: Negative for arthralgias and back pain.  Skin: Negative for rash.  Neurological: Positive for light-headedness (occ positional). Negative for headaches.  Psychiatric/Behavioral: Negative for dysphoric mood. The patient is nervous/anxious.        Objective:   Vitals:   06/04/19 0749  BP: 118/76  Pulse: 79  Resp: 16  Temp: 97.9 F (36.6 C)  SpO2: 97%   Filed Weights   06/04/19 0749  Weight: 185 lb (83.9 kg)   Body mass index is 25.09 kg/m.  BP Readings from Last 3 Encounters:  06/04/19 118/76  11/13/18 116/78  06/02/18 118/80    Wt Readings from Last 3 Encounters:  06/04/19 185 lb (83.9 kg)  05/27/19 175 lb (79.4 kg)  11/13/18 184 lb 12.8 oz (83.8 kg)     Physical Exam Constitutional: He appears well-developed and well-nourished. No distress.  HENT:  Head: Normocephalic and atraumatic.  Right Ear: External ear normal.  Left Ear: External ear normal.  Mouth/Throat: Oropharynx is clear and moist.  Normal ear canals and TM b/l  Eyes: Conjunctivae and EOM are normal.  Neck: Neck supple. No tracheal deviation present. No thyromegaly present.  No carotid bruit  Cardiovascular: Normal rate, regular rhythm, normal heart sounds and intact distal pulses.   No murmur heard. Pulmonary/Chest: Effort normal and breath sounds normal. No respiratory  distress. He has no wheezes. He has no rales.  Abdominal: Soft. He exhibits no distension. There is no tenderness.  Genitourinary: deferred  Musculoskeletal: He exhibits no edema.  Lymphadenopathy:   He has no cervical adenopathy.  Skin: Skin is warm and dry. He is  not diaphoretic.  Psychiatric: He has a normal mood and affect. His behavior is normal.         Assessment & Plan:   Physical exam: Screening blood work  ordered Immunizations  Discussed shingrix;  Had covid Colonoscopy   Due - scheduled Eye exams   Up to date Exercise   walking Weight   Normal BMI Substance abuse   none    See Problem List for Assessment and Plan of chronic medical problems.    This visit occurred during the SARS-CoV-2 public health emergency.  Safety protocols were in place, including screening questions prior to the visit, additional usage of staff PPE, and extensive cleaning of exam room while observing appropriate contact time as indicated for disinfecting solutions.

## 2019-06-03 NOTE — Patient Instructions (Addendum)
Blood work was ordered.    All other Health Maintenance issues reviewed.   All recommended immunizations and age-appropriate screenings are up-to-date or discussed.  No immunization administered today.   Medications reviewed and updated.  Changes include :   none    Please followup in 1 year    Health Maintenance, Male Adopting a healthy lifestyle and getting preventive care are important in promoting health and wellness. Ask your health care provider about:  The right schedule for you to have regular tests and exams.  Things you can do on your own to prevent diseases and keep yourself healthy. What should I know about diet, weight, and exercise? Eat a healthy diet   Eat a diet that includes plenty of vegetables, fruits, low-fat dairy products, and lean protein.  Do not eat a lot of foods that are high in solid fats, added sugars, or sodium. Maintain a healthy weight Body mass index (BMI) is a measurement that can be used to identify possible weight problems. It estimates body fat based on height and weight. Your health care provider can help determine your BMI and help you achieve or maintain a healthy weight. Get regular exercise Get regular exercise. This is one of the most important things you can do for your health. Most adults should:  Exercise for at least 150 minutes each week. The exercise should increase your heart rate and make you sweat (moderate-intensity exercise).  Do strengthening exercises at least twice a week. This is in addition to the moderate-intensity exercise.  Spend less time sitting. Even light physical activity can be beneficial. Watch cholesterol and blood lipids Have your blood tested for lipids and cholesterol at 66 years of age, then have this test every 5 years. You may need to have your cholesterol levels checked more often if:  Your lipid or cholesterol levels are high.  You are older than 66 years of age.  You are at high risk for  heart disease. What should I know about cancer screening? Many types of cancers can be detected early and may often be prevented. Depending on your health history and family history, you may need to have cancer screening at various ages. This may include screening for:  Colorectal cancer.  Prostate cancer.  Skin cancer.  Lung cancer. What should I know about heart disease, diabetes, and high blood pressure? Blood pressure and heart disease  High blood pressure causes heart disease and increases the risk of stroke. This is more likely to develop in people who have high blood pressure readings, are of African descent, or are overweight.  Talk with your health care provider about your target blood pressure readings.  Have your blood pressure checked: ? Every 3-5 years if you are 18-39 years of age. ? Every year if you are 40 years old or older.  If you are between the ages of 65 and 75 and are a current or former smoker, ask your health care provider if you should have a one-time screening for abdominal aortic aneurysm (AAA). Diabetes Have regular diabetes screenings. This checks your fasting blood sugar level. Have the screening done:  Once every three years after age 45 if you are at a normal weight and have a low risk for diabetes.  More often and at a younger age if you are overweight or have a high risk for diabetes. What should I know about preventing infection? Hepatitis B If you have a higher risk for hepatitis B, you should be screened for   this virus. Talk with your health care provider to find out if you are at risk for hepatitis B infection. Hepatitis C Blood testing is recommended for:  Everyone born from 1945 through 1965.  Anyone with known risk factors for hepatitis C. Sexually transmitted infections (STIs)  You should be screened each year for STIs, including gonorrhea and chlamydia, if: ? You are sexually active and are younger than 66 years of age. ? You are  older than 66 years of age and your health care provider tells you that you are at risk for this type of infection. ? Your sexual activity has changed since you were last screened, and you are at increased risk for chlamydia or gonorrhea. Ask your health care provider if you are at risk.  Ask your health care provider about whether you are at high risk for HIV. Your health care provider may recommend a prescription medicine to help prevent HIV infection. If you choose to take medicine to prevent HIV, you should first get tested for HIV. You should then be tested every 3 months for as long as you are taking the medicine. Follow these instructions at home: Lifestyle  Do not use any products that contain nicotine or tobacco, such as cigarettes, e-cigarettes, and chewing tobacco. If you need help quitting, ask your health care provider.  Do not use street drugs.  Do not share needles.  Ask your health care provider for help if you need support or information about quitting drugs. Alcohol use  Do not drink alcohol if your health care provider tells you not to drink.  If you drink alcohol: ? Limit how much you have to 0-2 drinks a day. ? Be aware of how much alcohol is in your drink. In the [place] one drink equals one 12 oz bottle of beer (355 mL), one 5 oz glass of wine (148 mL), or one 1 oz glass of hard liquor (44 mL). General instructions  Schedule regular health, dental, and eye exams.  Stay current with your vaccines.  Tell your health care provider if: ? You often feel depressed. ? You have ever been abused or do not feel safe at home. Summary  Adopting a healthy lifestyle and getting preventive care are important in promoting health and wellness.  Follow your health care provider's instructions about healthy diet, exercising, and getting tested or screened for diseases.  Follow your health care provider's instructions on monitoring your cholesterol and blood pressure. This  information is not intended to replace advice given to you by your health care provider. Make sure you discuss any questions you have with your health care provider. Document Revised: 03/04/2018 Document Reviewed: 03/04/2018 Elsevier Patient Education  2020 [employer].  

## 2019-06-04 ENCOUNTER — Encounter: Payer: Self-pay | Admitting: Internal Medicine

## 2019-06-04 ENCOUNTER — Ambulatory Visit (INDEPENDENT_AMBULATORY_CARE_PROVIDER_SITE_OTHER): Payer: Medicare HMO | Admitting: Internal Medicine

## 2019-06-04 ENCOUNTER — Other Ambulatory Visit: Payer: Self-pay

## 2019-06-04 VITALS — BP 118/76 | HR 79 | Temp 97.9°F | Resp 16 | Ht 72.0 in | Wt 185.0 lb

## 2019-06-04 DIAGNOSIS — F419 Anxiety disorder, unspecified: Secondary | ICD-10-CM

## 2019-06-04 DIAGNOSIS — E7849 Other hyperlipidemia: Secondary | ICD-10-CM

## 2019-06-04 DIAGNOSIS — R7303 Prediabetes: Secondary | ICD-10-CM

## 2019-06-04 DIAGNOSIS — Z9989 Dependence on other enabling machines and devices: Secondary | ICD-10-CM

## 2019-06-04 DIAGNOSIS — Z125 Encounter for screening for malignant neoplasm of prostate: Secondary | ICD-10-CM | POA: Diagnosis not present

## 2019-06-04 DIAGNOSIS — G4733 Obstructive sleep apnea (adult) (pediatric): Secondary | ICD-10-CM | POA: Diagnosis not present

## 2019-06-04 DIAGNOSIS — I10 Essential (primary) hypertension: Secondary | ICD-10-CM

## 2019-06-04 DIAGNOSIS — R69 Illness, unspecified: Secondary | ICD-10-CM | POA: Diagnosis not present

## 2019-06-04 DIAGNOSIS — Z0000 Encounter for general adult medical examination without abnormal findings: Secondary | ICD-10-CM

## 2019-06-04 LAB — COMPREHENSIVE METABOLIC PANEL
ALT: 37 U/L (ref 0–53)
AST: 27 U/L (ref 0–37)
Albumin: 4.4 g/dL (ref 3.5–5.2)
Alkaline Phosphatase: 72 U/L (ref 39–117)
BUN: 15 mg/dL (ref 6–23)
CO2: 28 mEq/L (ref 19–32)
Calcium: 9.6 mg/dL (ref 8.4–10.5)
Chloride: 104 mEq/L (ref 96–112)
Creatinine, Ser: 1.17 mg/dL (ref 0.40–1.50)
GFR: 62.4 mL/min (ref >60.00)
Glucose, Bld: 89 mg/dL (ref 70–99)
Potassium: 4.2 mEq/L (ref 3.5–5.1)
Sodium: 139 mEq/L (ref 135–145)
Total Bilirubin: 0.9 mg/dL (ref 0.2–1.2)
Total Protein: 7 g/dL (ref 6.0–8.3)

## 2019-06-04 LAB — CBC WITH DIFFERENTIAL/PLATELET
Basophils Absolute: 0.1 10*3/uL (ref 0.0–0.1)
Basophils Relative: 1.9 % (ref 0.0–3.0)
Eosinophils Absolute: 0.1 10*3/uL (ref 0.0–0.7)
Eosinophils Relative: 1.7 % (ref 0.0–5.0)
HCT: 46.4 % (ref 39.0–52.0)
Hemoglobin: 15.7 g/dL (ref 13.0–17.0)
Lymphocytes Relative: 39.7 % (ref 12.0–46.0)
Lymphs Abs: 1.5 10*3/uL (ref 0.7–4.0)
MCHC: 33.8 g/dL (ref 30.0–36.0)
MCV: 90.7 fl (ref 78.0–100.0)
Monocytes Absolute: 0.4 10*3/uL (ref 0.1–1.0)
Monocytes Relative: 10.6 % (ref 3.0–12.0)
Neutro Abs: 1.7 10*3/uL (ref 1.4–7.7)
Neutrophils Relative %: 46.1 % (ref 43.0–77.0)
Platelets: 187 10*3/uL (ref 150.0–400.0)
RBC: 5.12 Mil/uL (ref 4.22–5.81)
RDW: 13.4 % (ref 11.5–15.5)
WBC: 3.8 10*3/uL — ABNORMAL LOW (ref 4.0–10.5)

## 2019-06-04 LAB — LIPID PANEL
Cholesterol: 190 mg/dL (ref 0–200)
HDL: 47.8 mg/dL (ref >39.00)
LDL Cholesterol: 116 mg/dL — ABNORMAL HIGH (ref 0–99)
NonHDL: 142.24
Total CHOL/HDL Ratio: 4
Triglycerides: 129 mg/dL (ref 0.0–149.0)
VLDL: 25.8 mg/dL (ref 0.0–40.0)

## 2019-06-04 LAB — PSA, MEDICARE: PSA: 1.52 ng/ml (ref 0.10–4.00)

## 2019-06-04 LAB — HEMOGLOBIN A1C: Hgb A1c MFr Bld: 5.5 % (ref 4.6–6.5)

## 2019-06-04 LAB — TSH: TSH: 3.82 u[IU]/mL (ref 0.35–4.50)

## 2019-06-04 NOTE — Assessment & Plan Note (Signed)
Chronic Check lipid panel  Lifestyle controlled Regular exercise and healthy diet encouraged  

## 2019-06-04 NOTE — Assessment & Plan Note (Signed)
Chronic Check a1c Low sugar / carb diet Continue regular exercise 

## 2019-06-04 NOTE — Assessment & Plan Note (Signed)
Chronic Controlled, stable Continue current dose of medication - prozac 10 mg

## 2019-06-04 NOTE — Assessment & Plan Note (Signed)
Chronic BP well controlled Current regimen effective and well tolerated Continue current medications at current doses cmp  

## 2019-06-04 NOTE — Assessment & Plan Note (Signed)
Chronic Uses cpap nightly, 4-5 hrs most nights

## 2019-06-07 ENCOUNTER — Encounter: Payer: Self-pay | Admitting: Gastroenterology

## 2019-06-09 ENCOUNTER — Other Ambulatory Visit: Payer: Self-pay

## 2019-06-09 ENCOUNTER — Encounter: Payer: Self-pay | Admitting: Gastroenterology

## 2019-06-09 ENCOUNTER — Ambulatory Visit (AMBULATORY_SURGERY_CENTER): Payer: Medicare HMO | Admitting: Gastroenterology

## 2019-06-09 VITALS — BP 121/82 | HR 62 | Temp 97.1°F | Resp 14 | Ht 72.0 in | Wt 175.0 lb

## 2019-06-09 DIAGNOSIS — D122 Benign neoplasm of ascending colon: Secondary | ICD-10-CM | POA: Diagnosis not present

## 2019-06-09 DIAGNOSIS — Z1211 Encounter for screening for malignant neoplasm of colon: Secondary | ICD-10-CM

## 2019-06-09 DIAGNOSIS — Z86010 Personal history of colonic polyps: Secondary | ICD-10-CM

## 2019-06-09 MED ORDER — SODIUM CHLORIDE 0.9 % IV SOLN: 500.0000 mL | Freq: Once | INTRAVENOUS | Status: DC

## 2019-06-09 NOTE — Progress Notes (Signed)
PT taken to PACU. Monitors in place. VSS. Report given to RN. 

## 2019-06-09 NOTE — Progress Notes (Signed)
Temperature- Steven Wolf  Pt received second vaccine 05-11-19  Pt's states no medical or surgical changes since previsit or office visit.

## 2019-06-09 NOTE — Op Note (Signed)
[facility] Patient Name: Steven Wolf Procedure Date: 06/09/2019 11:03 AM MRN: [id number] Endoscopist: [place]. Loletha Carrow , MD Age: 64 Referring MD:  Date of Birth: [birth year] Gender: Male Account #: [id number] Procedure:                Colonoscopy Indications:              Screening for colorectal malignant neoplasm (no                            polyps 2010 or 2005. diminutive polyp 2002) Medicines:                Monitored Anesthesia Care Procedure:                Pre-Anesthesia Assessment:                           - Prior to the procedure, a History and Physical                            was performed, and patient medications and                            allergies were reviewed. The patient's tolerance of                            previous anesthesia was also reviewed. The risks                            and benefits of the procedure and the sedation                            options and risks were discussed with the patient.                            All questions were answered, and informed consent                            was obtained. Prior Anticoagulants: The patient has                            taken no previous anticoagulant or antiplatelet                            agents except for aspirin. ASA Grade Assessment: II                            - A patient with mild systemic disease. After                            reviewing the risks and benefits, the patient was                            deemed in satisfactory condition to undergo the  procedure.                           After obtaining informed consent, the colonoscope                            was passed under direct vision. Throughout the                            procedure, the patient's blood pressure, pulse, and                            oxygen saturations were monitored continuously. The                            Colonoscope was introduced through the anus and                             advanced to the the terminal ileum, with                            identification of the appendiceal orifice and IC                            valve. The colonoscopy was performed without                            difficulty. The patient tolerated the procedure                            well. The quality of the bowel preparation was good                            after lavage. The terminal ileum, ileocecal valve,                            appendiceal orifice, and rectum were photographed.                            The quality of the bowel preparation was evaluated                            using the BBPS [facility] Bowel Preparation Scale)                            with scores of: Right Colon = 2, Transverse Colon =                            2 and Left Colon = 2. The total BBPS score equals                            6. The bowel preparation used was Miralax. Scope In: 11:11:17 AM Scope Out: 11:26:35 AM Scope Withdrawal Time: 0 hours 11 minutes 56  seconds  Total Procedure Duration: 0 hours 15 minutes 18 seconds  Findings:                 The perianal and digital rectal examinations were                            normal.                           Multiple small-mouthed diverticula were found in                            the sigmoid colon.                           A 4-5 mm polyp was found in the distal ascending                            colon. The polyp was sessile. The polyp was removed                            with a cold snare. Resection and retrieval were                            complete.                           The exam was otherwise without abnormality on                            direct and retroflexion views. Complications:            No immediate complications. Estimated Blood Loss:     Estimated blood loss was minimal. Impression:               - Diverticulosis in the sigmoid colon.                           - One 4-5 mm polyp in the  distal ascending colon,                            removed with a cold snare. Resected and retrieved.                           - The examination was otherwise normal on direct                            and retroflexion views. Recommendation:           - Patient has a contact number available for                            emergencies. The signs and symptoms of potential                            delayed complications were discussed with the  patient. Return to normal activities tomorrow.                            Written discharge instructions were provided to the                            patient.                           - Resume previous diet.                           - Continue present medications.                           - Await pathology results.                           - Repeat colonoscopy is recommended for                            surveillance. The colonoscopy date will be                            determined after pathology results from today's                            exam become available for review. Vannie Hilgert L. Loletha Carrow, MD 06/09/2019 11:32:27 AM This report has been signed electronically.

## 2019-06-09 NOTE — Progress Notes (Signed)
Called to room to assist during endoscopic procedure.  Patient ID and intended procedure confirmed with present staff. Received instructions for my participation in the procedure from the performing physician.  

## 2019-06-09 NOTE — Patient Instructions (Signed)
HANDOUTS PROVIDED ON: POLYPS & DIVERTICULOSIS  The polyp removed today have been sent for pathology.  The results can take 1-3 weeks to receive.  When your next colonoscopy should occur will be based on the pathology results.    You may resume your previous diet and medication schedule.  Thank you for allowing us to care for you today!!!   YOU HAD AN ENDOSCOPIC PROCEDURE TODAY AT THE [facility]:   Refer to the procedure report that was given to you for any specific questions about what was found during the examination.  If the procedure report does not answer your questions, please call your gastroenterologist to clarify.  If you requested that your care partner not be given the details of your procedure findings, then the procedure report has been included in a sealed envelope for you to review at your convenience later.  YOU SHOULD EXPECT: Some feelings of bloating in the abdomen. Passage of more gas than usual.  Walking can help get rid of the air that was put into your GI tract during the procedure and reduce the bloating. If you had a lower endoscopy (such as a colonoscopy or flexible sigmoidoscopy) you may notice spotting of blood in your stool or on the toilet paper. If you underwent a bowel prep for your procedure, you may not have a normal bowel movement for a few days.  Please Note:  You might notice some irritation and congestion in your nose or some drainage.  This is from the oxygen used during your procedure.  There is no need for concern and it should clear up in a day or so.  SYMPTOMS TO REPORT IMMEDIATELY:   Following lower endoscopy (colonoscopy or flexible sigmoidoscopy):  Excessive amounts of blood in the stool  Significant tenderness or worsening of abdominal pains  Swelling of the abdomen that is new, acute  Fever of 100F or higher  For urgent or emergent issues, a gastroenterologist can be reached at any hour by calling [phone]. Do not use MyChart  messaging for urgent concerns.    DIET:  We do recommend a small meal at first, but then you may proceed to your regular diet.  Drink plenty of fluids but you should avoid alcoholic beverages for 24 hours.  ACTIVITY:  You should plan to take it easy for the rest of today and you should NOT DRIVE or use heavy machinery until tomorrow (because of the sedation medicines used during the test).    FOLLOW UP: Our staff will call the number listed on your records 48-72 hours following your procedure to check on you and address any questions or concerns that you may have regarding the information given to you following your procedure. If we do not reach you, we will leave a message.  We will attempt to reach you two times.  During this call, we will ask if you have developed any symptoms of COVID 19. If you develop any symptoms (ie: fever, flu-like symptoms, shortness of breath, cough etc.) before then, please call [phone].  If you test positive for Covid 19 in the 2 weeks post procedure, please call and report this information to us.    If any biopsies were taken you will be contacted by phone or by letter within the next 1-3 weeks.  Please call us at [phone] if you have not heard about the biopsies in 3 weeks.    SIGNATURES/CONFIDENTIALITY: You and/or your care partner have signed paperwork which will be   entered into your electronic medical record.  These signatures attest to the fact that that the information above on your After Visit Summary has been reviewed and is understood.  Full responsibility of the confidentiality of this discharge information lies with you and/or your care-partner. 

## 2019-06-11 ENCOUNTER — Telehealth: Payer: Self-pay

## 2019-06-11 NOTE — Telephone Encounter (Signed)
Covid-19 screening questions   Do you now or have you had a fever in the last 14 days? No. Do you have any respiratory symptoms of shortness of breath or cough now or in the last 14 days? No.  Do you have any family members or close contacts with diagnosed or suspected Covid-19 in the past 14 days? No.  Have you been tested for Covid-19 and found to be positive? No.       Follow up Call-  Call back number 06/09/2019  Post procedure Call Back phone  # [phone]  Permission to leave phone message Yes  Some recent data might be hidden     Patient questions:  Do you have a fever, pain , or abdominal swelling? No. Pain Score  0 *  Have you tolerated food without any problems? Yes.    Have you been able to return to your normal activities? Yes.    Do you have any questions about your discharge instructions: Diet   No. Medications  No. Follow up visit  No.  Do you have questions or concerns about your Care? No.  Actions: * If pain score is 4 or above: No action needed, pain <4.

## 2019-06-15 ENCOUNTER — Encounter: Payer: Self-pay | Admitting: Gastroenterology

## 2019-07-15 ENCOUNTER — Other Ambulatory Visit: Payer: Self-pay | Admitting: Internal Medicine

## 2019-07-15 DIAGNOSIS — I10 Essential (primary) hypertension: Secondary | ICD-10-CM

## 2019-07-23 DIAGNOSIS — H35013 Changes in retinal vascular appearance, bilateral: Secondary | ICD-10-CM | POA: Diagnosis not present

## 2019-07-23 DIAGNOSIS — H2513 Age-related nuclear cataract, bilateral: Secondary | ICD-10-CM | POA: Diagnosis not present

## 2019-07-23 DIAGNOSIS — H524 Presbyopia: Secondary | ICD-10-CM | POA: Diagnosis not present

## 2019-07-23 DIAGNOSIS — H25013 Cortical age-related cataract, bilateral: Secondary | ICD-10-CM | POA: Diagnosis not present

## 2019-09-02 DIAGNOSIS — R69 Illness, unspecified: Secondary | ICD-10-CM | POA: Diagnosis not present

## 2019-11-10 DIAGNOSIS — Z880 Allergy status to penicillin: Secondary | ICD-10-CM | POA: Diagnosis not present

## 2019-11-10 DIAGNOSIS — Z8249 Family history of ischemic heart disease and other diseases of the circulatory system: Secondary | ICD-10-CM | POA: Diagnosis not present

## 2019-11-10 DIAGNOSIS — I10 Essential (primary) hypertension: Secondary | ICD-10-CM | POA: Diagnosis not present

## 2019-11-10 DIAGNOSIS — R69 Illness, unspecified: Secondary | ICD-10-CM | POA: Diagnosis not present

## 2019-11-10 DIAGNOSIS — Z803 Family history of malignant neoplasm of breast: Secondary | ICD-10-CM | POA: Diagnosis not present

## 2019-11-10 DIAGNOSIS — Z7982 Long term (current) use of aspirin: Secondary | ICD-10-CM | POA: Diagnosis not present

## 2019-11-18 ENCOUNTER — Encounter: Payer: Self-pay | Admitting: Internal Medicine

## 2019-12-06 DIAGNOSIS — R69 Illness, unspecified: Secondary | ICD-10-CM | POA: Diagnosis not present

## 2019-12-11 NOTE — Progress Notes (Signed)
Subjective:    Patient ID: Steven Wolf, male    DOB: [birth year], 64 y.o.   MRN: [id number]  HPI The patient is here for an acute visit.   Itching in different areas x > 3 weeks.   It started on the bottom of his right foot.  A week or two after that he started getting prickly sensations in different places in his body at different times.  It would move around.  It was mild and he could scratch himself and it would go away.    He feels it most of the day every day.    He has taken a Claritin here or there but it did not help.  He denies new supplements or changes to his prescription medications.  He denies any changes in mood products.  At one point he had change soap, but changed back to his old soap and there was no change.  Medications and allergies reviewed with patient and updated if appropriate.  Patient Active Problem List   Diagnosis Date Noted  . Pain in finger of left hand 11/13/2018  . OSA on CPAP 06/28/2016  . Anxiety 04/07/2015  . Prediabetes 11/11/2013  . DIVERTICULOSIS, COLON 08/11/2009  . BPH (benign prostatic hyperplasia) 06/15/2008  . HEMATURIA 05/06/2008  . Hyperlipidemia 09/10/2007  . Essential hypertension, benign 04/17/2007  . COLONIC POLYPS, HX OF 04/17/2007  . SYNCOPE 04/03/2007    Current Outpatient Medications on File Prior to Visit  Medication Sig Dispense Refill  . aspirin 81 MG tablet Take 81 mg by mouth daily.      . Cholecalciferol (VITAMIN D) 2000 UNITS CAPS Take 2,000 Units by mouth daily.     . Coenzyme Q10 (COQ10) 100 MG CAPS Take 1 tablet by mouth daily.     . fish oil-omega-3 fatty acids 1000 MG capsule Take 1 g by mouth 2 (two) times daily.     Marland Kitchen FLUoxetine (PROZAC) 10 MG tablet TAKE 1 TABLET BY MOUTH EVERY DAY 90 tablet 1  . OMEGA-3 KRILL OIL PO Take 1 capsule by mouth daily.    . valsartan (DIOVAN) 80 MG tablet TAKE 1 TABLET BY MOUTH EVERY DAY 90 tablet 1   No current facility-administered medications on file prior to visit.     Past Medical History:  Diagnosis Date  . Anxiety   . Benign prostatic hypertrophy   . Diverticulosis of colon   . Gilbert syndrome   . Hypertension   . Microscopic hematuria   . Nephrolithiasis     X 2; Dr Serita Butcher  . Sleep apnea    USES CPAP  . Syncope 02/2007   NOCTURNAL POST [place]    Past Surgical History:  Procedure Laterality Date  . APPENDECTOMY    . COLONOSCOPY  2010   NORMAL(pt states 2 other colonoscopies in the past)  . COLONOSCOPY W/ POLYPECTOMY  2002   Tics 2005 & 2010; due 2020, Dr Sharlett Iles  . LIVER BIOPSY     X 1 for elevated LFTs with NEGATIVE  hepatitis serologies  . TONSILLECTOMY    . TONSILLECTOMY     PT STATES HAS HAD IT TWICE  . UPPER GASTROINTESTINAL ENDOSCOPY  1995    Social History   Socioeconomic History  . Marital status: Married    Spouse name: Not on file  . Number of children: Not on file  . Years of education: Not on file  . Highest education level: Not on file  Occupational History  . Not on  file  Tobacco Use  . Smoking status: Never Smoker  . Smokeless tobacco: Never Used  . Tobacco comment: Second hand smoke  Vaping Use  . Vaping Use: Never used  Substance and Sexual Activity  . Alcohol use: No  . Drug use: No  . Sexual activity: Not on file  Other Topics Concern  . Not on file  Social History Narrative  . Not on file   Social Determinants of Health   Financial Resource Strain:   . Difficulty of Paying Living Expenses: Not on file  Food Insecurity:   . Worried About Charity fundraiser in the Last Year: Not on file  . Ran Out of Food in the Last Year: Not on file  Transportation Needs:   . Lack of Transportation (Medical): Not on file  . Lack of Transportation (Non-Medical): Not on file  Physical Activity:   . Days of Exercise per Week: Not on file  . Minutes of Exercise per Session: Not on file  Stress:   . Feeling of Stress : Not on file  Social Connections:   . Frequency of Communication with  Friends and Family: Not on file  . Frequency of Social Gatherings with Friends and Family: Not on file  . Attends Religious Services: Not on file  . Active Member of Clubs or Organizations: Not on file  . Attends Archivist Meetings: Not on file  . Marital Status: Not on file    Family History  Problem Relation Age of Onset  . Breast cancer Mother   . Rheumatologic disease Mother        RA  . Heart disease Father        PCV;MI ? 30  . Polycythemia Father   . Leukemia Paternal Uncle   . Polycythemia Paternal Uncle   . Heart attack Paternal Grandfather        > 55  . Stroke Maternal Grandmother        . 65  . Sleep apnea Sister   . Diabetes Neg Hx   . Colon cancer Neg Hx   . Colon polyps Neg Hx   . Esophageal cancer Neg Hx   . Stomach cancer Neg Hx   . Rectal cancer Neg Hx     Review of Systems  Constitutional: Negative for chills and fever.  HENT: Negative for congestion, ear pain and sore throat.   Eyes: Negative for visual disturbance.       Occ puffy eyes  Respiratory: Negative for cough, shortness of breath and wheezing.   Cardiovascular: Negative for chest pain and palpitations.  Gastrointestinal: Negative for abdominal pain, constipation, diarrhea and nausea.  Genitourinary: Negative for dysuria and hematuria.  Musculoskeletal: Negative for arthralgias and gait problem.  Skin: Negative for color change and rash.  Neurological: Negative for weakness, numbness and headaches.       Twitches in leg muscles - chronic       Objective:   Vitals:   12/13/19 1133  BP: 118/80  Pulse: 69  Temp: 98.5 F (36.9 C)  SpO2: 98%   BP Readings from Last 3 Encounters:  12/13/19 118/80  06/09/19 121/82  06/04/19 118/76   Wt Readings from Last 3 Encounters:  12/13/19 185 lb 6.4 oz (84.1 kg)  06/09/19 175 lb (79.4 kg)  06/04/19 185 lb (83.9 kg)   Body mass index is 25.14 kg/m.   Physical Exam    Constitutional: Appears well-developed and well-nourished.  No distress.  Head: Normocephalic and  atraumatic.  Neck: Neck supple. No tracheal deviation present. No thyromegaly present.  No cervical lymphadenopathy Cardiovascular: Normal rate, regular rhythm and normal heart sounds.  No murmur heard.   No edema Pulmonary/Chest: Effort normal and breath sounds normal. No respiratory distress. No has no wheezes. No rales.  Skin: Skin is warm and dry. Not diaphoretic.  No rashes or scratch marks. Psychiatric: Normal mood and affect. Behavior is normal.       Assessment & Plan:    See Problem List for Assessment and Plan of chronic medical problems.    This visit occurred during the SARS-CoV-2 public health emergency.  Safety protocols were in place, including screening questions prior to the visit, additional usage of staff PPE, and extensive cleaning of exam room while observing appropriate contact time as indicated for disinfecting solutions.

## 2019-12-13 ENCOUNTER — Ambulatory Visit (INDEPENDENT_AMBULATORY_CARE_PROVIDER_SITE_OTHER): Payer: Medicare HMO | Admitting: Internal Medicine

## 2019-12-13 ENCOUNTER — Other Ambulatory Visit: Payer: Self-pay

## 2019-12-13 ENCOUNTER — Encounter: Payer: Self-pay | Admitting: Internal Medicine

## 2019-12-13 DIAGNOSIS — L299 Pruritus, unspecified: Secondary | ICD-10-CM | POA: Diagnosis not present

## 2019-12-13 NOTE — Patient Instructions (Addendum)
  Blood work was ordered.     Take your oral anti-histamine daily for now.   A referral was ordered for Allergy.  They will call you

## 2019-12-13 NOTE — Assessment & Plan Note (Signed)
Acute Started more than 3 weeks ago-it is intermittent, migratory mostly on arms and legs No obvious cause ?  Allergic We will check CBC, CMP Advised taking Claritin daily for at least a month Discussed that he could hold his supplements for now Will refer to allergy Doubt kidney, liver or neurological process

## 2019-12-14 LAB — CBC WITH DIFFERENTIAL/PLATELET
Absolute Monocytes: 421 cells/uL (ref 200–950)
Basophils Absolute: 69 cells/uL (ref 0–200)
Basophils Relative: 1.4 %
Eosinophils Absolute: 59 cells/uL (ref 15–500)
Eosinophils Relative: 1.2 %
HCT: 48.1 % (ref 38.5–50.0)
Hemoglobin: 16.6 g/dL (ref 13.2–17.1)
Lymphs Abs: 1798 cells/uL (ref 850–3900)
MCH: 31.1 pg (ref 27.0–33.0)
MCHC: 34.5 g/dL (ref 32.0–36.0)
MCV: 90.2 fL (ref 80.0–100.0)
MPV: 9.5 fL (ref 7.5–12.5)
Monocytes Relative: 8.6 %
Neutro Abs: 2553 cells/uL (ref 1500–7800)
Neutrophils Relative %: 52.1 %
Platelets: 206 10*3/uL (ref 140–400)
RBC: 5.33 10*6/uL (ref 4.20–5.80)
RDW: 12.6 % (ref 11.0–15.0)
Total Lymphocyte: 36.7 %
WBC: 4.9 10*3/uL (ref 3.8–10.8)

## 2019-12-14 LAB — COMPLETE METABOLIC PANEL WITH GFR
AG Ratio: 1.9 (calc) (ref 1.0–2.5)
ALT: 32 U/L (ref 9–46)
AST: 22 U/L (ref 10–35)
Albumin: 4.7 g/dL (ref 3.6–5.1)
Alkaline phosphatase (APISO): 77 U/L (ref 35–144)
BUN: 16 mg/dL (ref 7–25)
CO2: 26 mmol/L (ref 20–32)
Calcium: 9.6 mg/dL (ref 8.6–10.3)
Chloride: 104 mmol/L (ref 98–110)
Creat: 1.2 mg/dL (ref 0.70–1.25)
GFR, Est African American: 73 mL/min/{1.73_m2} (ref >=60)
GFR, Est Non African American: 63 mL/min/{1.73_m2} (ref >=60)
Globulin: 2.5 g/dL (calc) (ref 1.9–3.7)
Glucose, Bld: 92 mg/dL (ref 65–99)
Potassium: 4.5 mmol/L (ref 3.5–5.3)
Sodium: 140 mmol/L (ref 135–146)
Total Bilirubin: 0.9 mg/dL (ref 0.2–1.2)
Total Protein: 7.2 g/dL (ref 6.1–8.1)

## 2019-12-27 NOTE — Progress Notes (Signed)
New Patient Note  RE: Steven Wolf MRN: [id number] DOB: 05/19/1955 Date of Office Visit: 12/28/2019  Referring provider: Binnie Rail, MD Primary care provider: Binnie Rail, MD  Chief Complaint: Pruritus and Allergies  History of Present Illness: I had the pleasure of seeing Steven Wolf for initial evaluation at the [facility] on 12/28/2019. He is a 64 y.o. male, who is referred here by Binnie Rail, MD for the evaluation of pruritus and allergic rhinitis  Itching started about 4-5 weeks ago. This can occur anywhere on his body but initially started on the bottom of his foot.  Describes them as prickly sensation without any associated rashes. This can occur anytime during the day but worse in the mornings. Associated symptoms include: none. Suspected triggers are unknown. Denies any fevers, chills, changes in medications, foods, personal care products or recent infections. He has tried the following therapies: Claritin with minimal benefit. Systemic steroids - no. Currently on no daily medications.  Previous work up includes: 12/13/2019 CBC diff, CMP unremarkable. Previous history of rash/hives: hives in the past with no known triggers Patient is up to date with the following cancer screening tests: colonoscopy, physical exam, colonoscopy.  Rhinitis: He reports symptoms of sneezing, rhinorrhea, puffy eyes. Symptoms have been going on for many years. The symptoms are present mainly in the fall. Anosmia: no. Headache: no. He has used no medications for this in the past. Sinus infections: none. Previous work up includes: none. Previous ENT evaluation: not recently. Last eye exam: within the past year. History of reflux: no.  Assessment and Plan: Steven Wolf is a 64 y.o. male with: Pruritus Pruritus for 4-5 weeks with no associated rash. Started on the bottom of his foot and now occurring anywhere on his body. Worse in the mornings but occurs daily with no known  triggers. Tried Claritin with minimal benefit. 12/13/2019 CBC diff, CMP unremarkable. Denies changes in diet, medications or personal care products.  Today's skin testing showed:Positive to 2 molds and dust mites. Borderline positive to cockroaches. Negative to common foods. Results given.   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   See proper skin care as below.   Start environmental control measures as below - the dust mite allergy maybe contributing to the itching.   May use over the counter antihistamines such as Zyrtec (cetirizine) 10mg  daily at night and see if it improves the itching.   Other allergic rhinitis Mild rhinitis symptoms during the fall.  Takes no medications for this.  No prior allergy/ENT evaluation.  Today's skin testing showed: Positive to 2 molds and dust mites.  Borderline positive to cockroaches.   Start environmental control measures as below.  May use over the counter antihistamines such as Zyrtec (cetirizine) daily at night.  Drug reaction Allergic to penicillin as a child and unsure of exact reactions.  Consider penicillin skin testing/drug challenge in the future.  If interested we can schedule drug challenge to penicillin. You must be off antihistamines for 3-5 days before. Must be in good health and not ill. Plan on being in the office for 2-3 hours and must bring in the drug you want to do the oral challenge for - will send in prescription to pick up a few days before. You must call to schedule an appointment and specify it's for a drug challenge.   More than 90% of patients outgrow their penicillin allergy.   Return in about 4 months (around  04/29/2020).  Other allergy screening: Asthma: no Food allergy: no Medication allergy: yes  Penicillin - unsure of exact reaction as a child. Hymenoptera allergy: no Eczema: minimal History of recurrent infections suggestive of immunodeficency: no  Diagnostics: Skin Testing:  Environmental allergy panel and select foods. Positive to 2 molds and dust mites.  Borderline positive to cockroaches.  Negative to common foods.  Results discussed with patient/family.  Airborne Adult Perc - 12/28/19 0932    Time Antigen Placed 0932    Allergen Manufacturer Lavella Hammock    Location Back    Number of Test 59    Panel 1 Select    1. Control-Buffer 50% Glycerol Negative    2. Control-Histamine 1 mg/ml 2+    3. Albumin saline Negative    4. [place] Negative    5. [place] Negative    6. Johnson Negative    7. Virgie Blue Negative    8. Meadow Fescue Negative    9. Perennial Rye Negative    10. Sweet Vernal Negative    11. Timothy Negative    12. Cocklebur Negative    13. Burweed Marshelder Negative    14. Ragweed, short Negative    15. Ragweed, Giant Negative    16. Plantain,  English Negative    17. Lamb's Quarters Negative    18. Sheep Sorrell Negative    19. Rough Pigweed Negative    20. Marsh Elder, Rough Negative    21. Mugwort, Common Negative    22. Ash mix Negative    23. Birch mix Negative    24. Beech American Negative    25. Box, Elder Negative    26. Cedar, red Negative    27. Cottonwood, [place] Negative    28. Elm mix Negative    29. Hickory Negative    30. Maple mix Negative    31. Oak, [place] mix Negative    32. Pecan Pollen Negative    33. Pine mix Negative    34. Sycamore Eastern Negative    35. [place], Black Pollen Negative    36. Alternaria alternata Negative    37. Cladosporium Herbarum Negative    38. Aspergillus mix Negative    39. Penicillium mix Negative    40. Bipolaris sorokiniana (Helminthosporium) Negative    41. Drechslera spicifera (Curvularia) Negative    42. Mucor plumbeus Negative    43. Fusarium moniliforme Negative    44. Aureobasidium pullulans (pullulara) Negative    45. Rhizopus oryzae Negative    46. Botrytis cinera Negative    47. Epicoccum nigrum Negative    48. Phoma betae Negative    49. Candida Albicans  Negative    50. Trichophyton mentagrophytes Negative    51. Mite, D Farinae  5,000 AU/ml Negative    52. Mite, D Pteronyssinus  5,000 AU/ml Negative    53. Cat Hair 10,000 BAU/ml Negative    54.  Dog Epithelia Negative    55. Mixed Feathers Negative    56. Horse Epithelia Negative    57. Cockroach, [place] --   +/-   58. Mouse Negative    59. Tobacco Leaf Negative          Food Perc - 12/28/19 0933      Test Information   Time Antigen Placed 9390    Allergen Manufacturer Lavella Hammock    Location Back    Number of allergen test [place]   1. Peanut Negative  2. Soybean food Negative    3. Wheat, whole Negative    4. Sesame Negative    5. Milk, cow Negative    6. Egg White, chicken Negative    7. Casein Negative    8. Shellfish mix Negative    9. Fish mix Negative    10. Cashew Negative          Intradermal - 12/28/19 0958    Time Antigen Placed 6440    Allergen Manufacturer Lavella Hammock    Location Arm    Number of Test 14    Intradermal Select    Control Negative    [place] Negative    Johnson Negative    7 Grass Negative    Ragweed mix Negative    Weed mix Negative    Tree mix Negative    Mold 1 Negative    Mold 2 2+    Mold 3 Negative    Mold 4 Negative    Cat Negative    Dog Negative    Mite mix 2+           Past Medical History: Patient Active Problem List   Diagnosis Date Noted  . Other allergic rhinitis 12/28/2019  . Drug reaction 12/28/2019  . Pruritus 12/13/2019  . Pain in finger of left hand 11/13/2018  . OSA on CPAP 06/28/2016  . Anxiety 04/07/2015  . Prediabetes 11/11/2013  . DIVERTICULOSIS, COLON 08/11/2009  . BPH (benign prostatic hyperplasia) 06/15/2008  . HEMATURIA 05/06/2008  . Hyperlipidemia 09/10/2007  . Essential hypertension, benign 04/17/2007  . COLONIC POLYPS, HX OF 04/17/2007  . SYNCOPE 04/03/2007   Past Medical History:  Diagnosis Date  . Anxiety   . Benign prostatic hypertrophy   . Diverticulosis of  colon   . Gilbert syndrome   . Hypertension   . Microscopic hematuria   . Nephrolithiasis     X 2; Dr Serita Butcher  . Sleep apnea    USES CPAP  . Syncope 02/2007   NOCTURNAL POST MICTURTION  . Urticaria    Past Surgical History: Past Surgical History:  Procedure Laterality Date  . APPENDECTOMY    . COLONOSCOPY  2010   NORMAL(pt states 2 other colonoscopies in the past)  . COLONOSCOPY W/ POLYPECTOMY  2002   Tics 2005 & 2010; due 2020, Dr Sharlett Iles  . LIVER BIOPSY     X 1 for elevated LFTs with NEGATIVE  hepatitis serologies  . TONSILLECTOMY    . TONSILLECTOMY     PT STATES HAS HAD IT TWICE  . UPPER GASTROINTESTINAL ENDOSCOPY  1995   Medication List:  Current Outpatient Medications  Medication Sig Dispense Refill  . aspirin 81 MG tablet Take 81 mg by mouth daily.      . Cholecalciferol (VITAMIN D) 2000 UNITS CAPS Take 2,000 Units by mouth daily.     . Coenzyme Q10 (COQ10) 100 MG CAPS Take 1 tablet by mouth daily.     . fish oil-omega-3 fatty acids 1000 MG capsule Take 1 g by mouth 2 (two) times daily.     Marland Kitchen FLUoxetine (PROZAC) 10 MG tablet TAKE 1 TABLET BY MOUTH EVERY DAY 90 tablet 1  . OMEGA-3 KRILL OIL PO Take 1 capsule by mouth daily.    . valsartan (DIOVAN) 80 MG tablet TAKE 1 TABLET BY MOUTH EVERY DAY 90 tablet 1   No current facility-administered medications for this visit.   Allergies: Allergies  Allergen Reactions  . Ramipril     REACTION: elevated liver enzymes  .  Penicillins     ? Reaction (as child)   Social History: Social History   Socioeconomic History  . Marital status: Married    Spouse name: Not on file  . Number of children: Not on file  . Years of education: Not on file  . Highest education level: Not on file  Occupational History  . Not on file  Tobacco Use  . Smoking status: Never Smoker  . Smokeless tobacco: Never Used  . Tobacco comment: Second hand smoke  Vaping Use  . Vaping Use: Never used  Substance and Sexual Activity  . Alcohol  use: No  . Drug use: No  . Sexual activity: Not on file  Other Topics Concern  . Not on file  Social History Narrative  . Not on file   Social Determinants of Health   Financial Resource Strain:   . Difficulty of Paying Living Expenses: Not on file  Food Insecurity:   . Worried About Charity fundraiser in the Last Year: Not on file  . Ran Out of Food in the Last Year: Not on file  Transportation Needs:   . Lack of Transportation (Medical): Not on file  . Lack of Transportation (Non-Medical): Not on file  Physical Activity:   . Days of Exercise per Week: Not on file  . Minutes of Exercise per Session: Not on file  Stress:   . Feeling of Stress : Not on file  Social Connections:   . Frequency of Communication with Friends and Family: Not on file  . Frequency of Social Gatherings with Friends and Family: Not on file  . Attends Religious Services: Not on file  . Active Member of Clubs or Organizations: Not on file  . Attends Archivist Meetings: Not on file  . Marital Status: Not on file   Lives in a 26+ year old home. Smoking: denies Occupation: retired  Programme researcher, broadcasting/film/video HistoryFreight forwarder in the house: no Charity fundraiser in the family room: yes Carpet in the bedroom: yes Heating: heat pump Cooling: heat pump Pet: no  Family History: Family History  Problem Relation Age of Onset  . Breast cancer Mother   . Rheumatologic disease Mother        RA  . Heart disease Father        PCV;MI ? 38  . Polycythemia Father   . Leukemia Paternal Uncle   . Polycythemia Paternal Uncle   . Heart attack Paternal Grandfather        > 55  . Stroke Maternal Grandmother        . 65  . Sleep apnea Sister   . Diabetes Neg Hx   . Colon cancer Neg Hx   . Colon polyps Neg Hx   . Esophageal cancer Neg Hx   . Stomach cancer Neg Hx   . Rectal cancer Neg Hx    Problem                               Relation Asthma                                   No  Eczema                                 No  Food allergy  No  Allergic rhino conjunctivitis     No    Review of Systems  Constitutional: Negative for appetite change, chills, fever and unexpected weight change.  HENT: Positive for rhinorrhea and sneezing. Negative for congestion.   Eyes: Negative for itching.  Respiratory: Negative for cough, chest tightness, shortness of breath and wheezing.   Cardiovascular: Negative for chest pain.  Gastrointestinal: Negative for abdominal pain.  Genitourinary: Negative for difficulty urinating.  Skin: Negative for rash.       itching  Allergic/Immunologic: Positive for environmental allergies. Negative for food allergies.  Neurological: Negative for headaches.   Objective: BP 106/80   Pulse 73   Temp (!) 96.9 F (36.1 C) (Temporal)   Resp 16   Ht 5' 11.75" (1.822 m)   Wt 185 lb (83.9 kg)   SpO2 96%   BMI 25.27 kg/m  Body mass index is 25.27 kg/m. Physical Exam Vitals and nursing note reviewed.  Constitutional:      Appearance: Normal appearance. He is well-developed.  HENT:     Head: Normocephalic and atraumatic.     Right Ear: External ear normal.     Left Ear: External ear normal.     Nose: Nose normal.     Mouth/Throat:     Mouth: Mucous membranes are moist.     Pharynx: Oropharynx is clear.  Eyes:     Conjunctiva/sclera: Conjunctivae normal.  Cardiovascular:     Rate and Rhythm: Normal rate and regular rhythm.     Heart sounds: Normal heart sounds. No murmur heard.  No friction rub. No gallop.   Pulmonary:     Effort: Pulmonary effort is normal.     Breath sounds: Normal breath sounds. No wheezing, rhonchi or rales.  Abdominal:     Palpations: Abdomen is soft.  Musculoskeletal:     Cervical back: Neck supple.  Skin:    General: Skin is warm.     Findings: No rash.  Neurological:     Mental Status: He is alert and oriented to person, place, and time.  Psychiatric:        Behavior: Behavior normal.    The plan was  reviewed with the patient/family, and all questions/concerned were addressed.  It was my pleasure to see Steven Wolf today and participate in his care. Please feel free to contact me with any questions or concerns.  Sincerely,  Rexene Alberts, DO Allergy & Immunology  [facility] office: [place] office: [phone]

## 2019-12-28 ENCOUNTER — Ambulatory Visit (INDEPENDENT_AMBULATORY_CARE_PROVIDER_SITE_OTHER): Payer: Medicare HMO | Admitting: Allergy

## 2019-12-28 ENCOUNTER — Encounter: Payer: Self-pay | Admitting: Allergy

## 2019-12-28 ENCOUNTER — Other Ambulatory Visit: Payer: Self-pay

## 2019-12-28 VITALS — BP 106/80 | HR 73 | Temp 96.9°F | Resp 16 | Ht 71.75 in | Wt 185.0 lb

## 2019-12-28 DIAGNOSIS — T50905A Adverse effect of unspecified drugs, medicaments and biological substances, initial encounter: Secondary | ICD-10-CM | POA: Insufficient documentation

## 2019-12-28 DIAGNOSIS — J3089 Other allergic rhinitis: Secondary | ICD-10-CM | POA: Insufficient documentation

## 2019-12-28 DIAGNOSIS — T50905D Adverse effect of unspecified drugs, medicaments and biological substances, subsequent encounter: Secondary | ICD-10-CM | POA: Diagnosis not present

## 2019-12-28 DIAGNOSIS — L299 Pruritus, unspecified: Secondary | ICD-10-CM

## 2019-12-28 NOTE — Assessment & Plan Note (Signed)
Allergic to penicillin as a child and unsure of exact reactions.  Consider penicillin skin testing/drug challenge in the future.  If interested we can schedule drug challenge to penicillin. You must be off antihistamines for 3-5 days before. Must be in good health and not ill. Plan on being in the office for 2-3 hours and must bring in the drug you want to do the oral challenge for - will send in prescription to pick up a few days before. You must call to schedule an appointment and specify it's for a drug challenge.   More than 90% of patients outgrow their penicillin allergy.

## 2019-12-28 NOTE — Patient Instructions (Addendum)
Today's skin testing showed: Positive to 2 molds and dust mites.  Borderline positive to cockroaches.  Negative to common foods.  Results given.   Itching:   See proper skin care as below.   Start environmental control measures as below - the dust mite allergy maybe contributing to your itching.   May use over the counter antihistamines such as Zyrtec (cetirizine) daily at night and see if it improves the itching.   Follow up with PCP if not improving.  Drug allergy:  Consider penicillin skin testing/drug challenge in the future.  If interested we can schedule drug challenge to penicillin. You must be off antihistamines for 3-5 days before. Must be in good health and not ill. Plan on being in the office for 2-3 hours and must bring in the drug you want to do the oral challenge for - will send in prescription to pick up a few days before. You must call to schedule an appointment and specify it's for a drug challenge.   More than 90% of patients outgrow their penicillin allergy.   Follow up in 4 months or sooner if needed.    Skin care recommendations  Bath time: . Always use lukewarm water. AVOID very hot or cold water. Marland Kitchen Keep bathing time to 5-10 minutes. . Do NOT use bubble bath. . Use a mild soap and use just enough to wash the dirty areas. . Do NOT scrub skin vigorously.  . After bathing, pat dry your skin with a towel. Do NOT rub or scrub the skin.  Moisturizers and prescriptions:  . ALWAYS apply moisturizers immediately after bathing (within 3 minutes). This helps to lock-in moisture. . Use the moisturizer several times a day over the whole body. Kermit Balo summer moisturizers include: Aveeno, CeraVe, Cetaphil. Kermit Balo winter moisturizers include: Aquaphor, Vaseline, Cerave, Cetaphil, Eucerin, Vanicream. . When using moisturizers along with medications, the moisturizer should be applied about one hour after applying the medication to prevent diluting effect of the medication  or moisturize around where you applied the medications. When not using medications, the moisturizer can be continued twice daily as maintenance.  Laundry and clothing: . Avoid laundry products with added color or perfumes. . Use unscented hypo-allergenic laundry products such as Tide free, Cheer free & gentle, and All free and clear.  . If the skin still seems dry or sensitive, you can try double-rinsing the clothes. . Avoid tight or scratchy clothing such as wool. . Do not use fabric softeners or dyer sheets.  Control of House Dust Mite Allergen . Dust mite allergens are a common trigger of allergy and asthma symptoms. While they can be found throughout the house, these microscopic creatures thrive in warm, humid environments such as bedding, upholstered furniture and carpeting. . Because so much time is spent in the bedroom, it is essential to reduce mite levels there.  . Encase pillows, mattresses, and box springs in special allergen-proof fabric covers or airtight, zippered plastic covers.  . Bedding should be washed weekly in hot water (130 F) and dried in a hot dryer. Allergen-proof covers are available for comforters and pillows that can't be regularly washed.  Wendee Copp the allergy-proof covers every few months. Minimize clutter in the bedroom. Keep pets out of the bedroom.  Marland Kitchen Keep humidity less than 50% by using a dehumidifier or air conditioning. You can buy a humidity measuring device called a hygrometer to monitor this.  . If possible, replace carpets with hardwood, linoleum, or washable area rugs.  If that's not possible, vacuum frequently with a vacuum that has a HEPA filter. . Remove all upholstered furniture and non-washable window drapes from the bedroom. . Remove all non-washable stuffed toys from the bedroom.  Wash stuffed toys weekly. Mold Control . Mold and fungi can grow on a variety of surfaces provided certain temperature and moisture conditions exist.  . Outdoor molds grow  on plants, decaying vegetation and soil. The major outdoor mold, Alternaria and Cladosporium, are found in very high numbers during hot and dry conditions. Generally, a late summer - fall peak is seen for common outdoor fungal spores. Rain will temporarily lower outdoor mold spore count, but counts rise rapidly when the rainy period ends. . The most important indoor molds are Aspergillus and Penicillium. Dark, humid and poorly ventilated basements are ideal sites for mold growth. The next most common sites of mold growth are the bathroom and the kitchen. Outdoor (Seasonal) Mold Control . Use air conditioning and keep windows closed. . Avoid exposure to decaying vegetation. Marland Kitchen Avoid leaf raking. . Avoid grain handling. . Consider wearing a face mask if working in moldy areas.  Indoor (Perennial) Mold Control  . Maintain humidity below 50%. . Get rid of mold growth on hard surfaces with water, detergent and, if necessary, 5% bleach (do not mix with other cleaners). Then dry the area completely. If mold covers an area more than 10 square feet, consider hiring an indoor environmental professional. . For clothing, washing with soap and water is best. If moldy items cannot be cleaned and dried, throw them away. . Remove sources e.g. contaminated carpets. . Repair and seal leaking roofs or pipes. Using dehumidifiers in damp basements may be helpful, but empty the water and clean units regularly to prevent mildew from forming. All rooms, especially basements, bathrooms and kitchens, require ventilation and cleaning to deter mold and mildew growth. Avoid carpeting on concrete or damp floors, and storing items in damp areas. Cockroach Allergen Avoidance Cockroaches are often found in the homes of densely populated urban areas, schools or commercial buildings, but these creatures can lurk almost anywhere. This does not mean that you have a dirty house or living area. . Block all areas where roaches can enter the  home. This includes crevices, wall cracks and windows.  . Cockroaches need water to survive, so fix and seal all leaky faucets and pipes. Have an exterminator go through the house when your family and pets are gone to eliminate any remaining roaches. Marland Kitchen Keep food in lidded containers and put pet food dishes away after your pets are done eating. Vacuum and sweep the floor after meals, and take out garbage and recyclables. Use lidded garbage containers in the kitchen. Wash dishes immediately after use and clean under stoves, refrigerators or toasters where crumbs can accumulate. Wipe off the stove and other kitchen surfaces and cupboards regularly.

## 2019-12-28 NOTE — Assessment & Plan Note (Signed)
Pruritus for 4-5 weeks with no associated rash. Started on the bottom of his foot and now occurring anywhere on his body. Worse in the mornings but occurs daily with no known triggers. Tried Claritin with minimal benefit. 12/13/2019 CBC diff, CMP unremarkable. Denies changes in diet, medications or personal care products.  Today's skin testing showed:Positive to 2 molds and dust mites. Borderline positive to cockroaches. Negative to common foods. Results given.   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   See proper skin care as below.   Start environmental control measures as below - the dust mite allergy maybe contributing to the itching.   May use over the counter antihistamines such as Zyrtec (cetirizine) 10mg  daily at night and see if it improves the itching.

## 2019-12-28 NOTE — Assessment & Plan Note (Signed)
Mild rhinitis symptoms during the fall.  Takes no medications for this.  No prior allergy/ENT evaluation.  Today's skin testing showed: Positive to 2 molds and dust mites.  Borderline positive to cockroaches.   Start environmental control measures as below.  May use over the counter antihistamines such as Zyrtec (cetirizine) daily at night.

## 2020-01-12 ENCOUNTER — Encounter: Payer: Self-pay | Admitting: Internal Medicine

## 2020-01-17 DIAGNOSIS — L918 Other hypertrophic disorders of the skin: Secondary | ICD-10-CM | POA: Diagnosis not present

## 2020-01-17 DIAGNOSIS — L820 Inflamed seborrheic keratosis: Secondary | ICD-10-CM | POA: Diagnosis not present

## 2020-01-17 DIAGNOSIS — D2230 Melanocytic nevi of unspecified part of face: Secondary | ICD-10-CM | POA: Diagnosis not present

## 2020-01-17 DIAGNOSIS — L503 Dermatographic urticaria: Secondary | ICD-10-CM | POA: Diagnosis not present

## 2020-02-07 ENCOUNTER — Other Ambulatory Visit: Payer: Self-pay | Admitting: Internal Medicine

## 2020-02-07 DIAGNOSIS — I10 Essential (primary) hypertension: Secondary | ICD-10-CM

## 2020-03-02 ENCOUNTER — Telehealth: Payer: Self-pay | Admitting: Pharmacist

## 2020-03-02 ENCOUNTER — Other Ambulatory Visit: Payer: Self-pay | Admitting: Internal Medicine

## 2020-03-02 DIAGNOSIS — R202 Paresthesia of skin: Secondary | ICD-10-CM

## 2020-03-02 NOTE — Progress Notes (Signed)
    Chronic Care Management Pharmacy Assistant   Name: JAKOBEE BRACKINS  MRN: [id number] DOB: 12-19-1955  Reason for Encounter: Medication Adherence Call    PCP : Binnie Rail, MD  Allergies:   Allergies  Allergen Reactions  . Ramipril     REACTION: elevated liver enzymes  . Penicillins     ? Reaction (as child)    Medications: Outpatient Encounter Medications as of 03/02/2020  Medication Sig  . aspirin 81 MG tablet Take 81 mg by mouth daily.    . Cholecalciferol (VITAMIN D) 2000 UNITS CAPS Take 2,000 Units by mouth daily.   . Coenzyme Q10 (COQ10) 100 MG CAPS Take 1 tablet by mouth daily.   . fish oil-omega-3 fatty acids 1000 MG capsule Take 1 g by mouth 2 (two) times daily.   Marland Kitchen FLUoxetine (PROZAC) 10 MG tablet TAKE 1 TABLET BY MOUTH EVERY DAY  . OMEGA-3 KRILL OIL PO Take 1 capsule by mouth daily.  . valsartan (DIOVAN) 80 MG tablet TAKE 1 TABLET BY MOUTH EVERY DAY   No facility-administered encounter medications on file as of 03/02/2020.    Current Diagnosis: Patient Active Problem List   Diagnosis Date Noted  . Other allergic rhinitis 12/28/2019  . Drug reaction 12/28/2019  . Pruritus 12/13/2019  . Pain in finger of left hand 11/13/2018  . OSA on CPAP 06/28/2016  . Anxiety 04/07/2015  . Prediabetes 11/11/2013  . DIVERTICULOSIS, COLON 08/11/2009  . BPH (benign prostatic hyperplasia) 06/15/2008  . HEMATURIA 05/06/2008  . Hyperlipidemia 09/10/2007  . Essential hypertension, benign 04/17/2007  . COLONIC POLYPS, HX OF 04/17/2007  . SYNCOPE 04/03/2007    Goals Addressed   None     Follow-Up:  Pharmacist Review    The patient was on the medication adherence list for his Valsartan 80mg  he was due for a refill on 02/23/2020, but after speaking with the patient he picked up his medication a few days after the 1st. He did confirm that he has been taking the Valsartan daily.  The patient did ask if he can get a referral to a Neurologist for the pens and needles that  he has been feeling on his hands and feet. A message has been sent to clinical pharmacist.  Rosendo Gros, [facility]  Practice Team Manager/ CPA (Clinical Pharmacist Assistant) [phone]

## 2020-03-06 ENCOUNTER — Encounter: Payer: Self-pay | Admitting: Neurology

## 2020-03-07 DIAGNOSIS — R69 Illness, unspecified: Secondary | ICD-10-CM | POA: Diagnosis not present

## 2020-04-24 ENCOUNTER — Other Ambulatory Visit: Payer: Self-pay

## 2020-04-24 ENCOUNTER — Ambulatory Visit: Payer: Medicare PPO | Admitting: Neurology

## 2020-04-24 ENCOUNTER — Other Ambulatory Visit (INDEPENDENT_AMBULATORY_CARE_PROVIDER_SITE_OTHER): Payer: Medicare PPO

## 2020-04-24 ENCOUNTER — Encounter: Payer: Self-pay | Admitting: Neurology

## 2020-04-24 VITALS — BP 107/77 | HR 93 | Ht 72.0 in | Wt 189.0 lb

## 2020-04-24 DIAGNOSIS — R208 Other disturbances of skin sensation: Secondary | ICD-10-CM | POA: Diagnosis not present

## 2020-04-24 LAB — VITAMIN B12: Vitamin B-12: 403 pg/mL (ref 211–911)

## 2020-04-24 NOTE — Progress Notes (Signed)
[facility] Clinic Note - Initial Visit   Date: 04/24/20  ORY ELTING MRN: [id number] DOB: 21-Aug-1955   Dear Dr. Quay Burow:  Thank you for your kind referral of Steven Wolf for consultation of abnormal skin sensation. Although his history is well known to you, please allow [place] to reiterate it for the purpose of our medical record. The patient was accompanied to the clinic by self.   History of Present Illness: Steven Wolf is a 65 y.o. right-handed male with anxiety and OSA on CPAP presenting for evaluation of abnormal skin sensation. Starting around September 2021, he began having prickly sensation over the ankles and lower legs, it does not involve the toes.  He also have the same sensation over the dorsum of the thumb and index finger.  Occasionally, he has it in the arms, shoulder, and scalp. It jumps around on his body, occurring daily, lasting for a few seconds, but ongoing throughout the day. Around December, the intensity has not been as bad. He denies numbness or tingling.  Nothing that exacerbate or alleviates symptoms.  Incidentally, he stopped eating microwave popcorn around December after which he noticed symptoms to be less bothersome.  He denies imbalance, weakness, or falls. He saw an allergist who recommended using a topical cream. Dermatology evaluation was unremarkable.  He is retired Chief Financial Officer.  He does not drink alcohol or smoke.  No diabetes.   Out-side paper records, electronic medical record, and images have been reviewed where available and summarized as:  Lab Results  Component Value Date   HGBA1C 5.5 06/04/2019   No results found for: CHENIDPO24 Lab Results  Component Value Date   TSH 3.82 06/04/2019     Past Medical History:  Diagnosis Date  . Anxiety   . Benign prostatic hypertrophy   . Diverticulosis of colon   . Gilbert syndrome   . Hypertension   . Microscopic hematuria   . Nephrolithiasis     X 2; Dr  Serita Butcher  . Sleep apnea    USES CPAP  . Syncope 02/2007   NOCTURNAL POST MICTURTION  . Urticaria     Past Surgical History:  Procedure Laterality Date  . APPENDECTOMY    . COLONOSCOPY  2010   NORMAL(pt states 2 other colonoscopies in the past)  . COLONOSCOPY W/ POLYPECTOMY  2002   Tics 2005 & 2010; due 2020, Dr Sharlett Iles  . LIVER BIOPSY     X 1 for elevated LFTs with NEGATIVE  hepatitis serologies  . TONSILLECTOMY    . TONSILLECTOMY     PT STATES HAS HAD IT TWICE  . UPPER GASTROINTESTINAL ENDOSCOPY  1995     Medications:  Outpatient Encounter Medications as of 04/24/2020  Medication Sig  . aspirin 81 MG tablet Take 81 mg by mouth daily.  . Cholecalciferol (VITAMIN D) 2000 UNITS CAPS Take 2,000 Units by mouth daily.  . Coenzyme Q10 (COQ10) 100 MG CAPS Take 1 tablet by mouth daily.  . fish oil-omega-3 fatty acids 1000 MG capsule Take 1 g by mouth 2 (two) times daily.  Marland Kitchen FLUoxetine (PROZAC) 10 MG tablet TAKE 1 TABLET BY MOUTH EVERY DAY  . loratadine (CLARITIN) 10 MG tablet Take 10 mg by mouth daily.  . OMEGA-3 KRILL OIL PO Take 1 capsule by mouth daily.  . valsartan (DIOVAN) 80 MG tablet TAKE 1 TABLET BY MOUTH EVERY DAY   No facility-administered encounter medications on file as of 04/24/2020.    Allergies:  Allergies  Allergen  Reactions  . Ramipril     REACTION: elevated liver enzymes  . Penicillins     ? Reaction (as child)    Family History: Family History  Problem Relation Age of Onset  . Breast cancer Mother   . Rheumatologic disease Mother        RA  . Heart disease Father        PCV;MI ? 37  . Polycythemia Father   . Leukemia Paternal Uncle   . Polycythemia Paternal Uncle   . Heart attack Paternal Grandfather        > 55  . Stroke Maternal Grandmother        . 65  . Sleep apnea Sister   . Diabetes Neg Hx   . Colon cancer Neg Hx   . Colon polyps Neg Hx   . Esophageal cancer Neg Hx   . Stomach cancer Neg Hx   . Rectal cancer Neg Hx     Social  History: Social History   Tobacco Use  . Smoking status: Never Smoker  . Smokeless tobacco: Never Used  . Tobacco comment: Second hand smoke  Vaping Use  . Vaping Use: Never used  Substance Use Topics  . Alcohol use: No  . Drug use: No   Social History   Social History Narrative   Right Handed   Lives in a two story home    Drinks some caffeine     Vital Signs:  BP 107/77   Pulse 93   Ht 6' (1.829 m)   Wt 189 lb (85.7 kg)   SpO2 97%   BMI 25.63 kg/m    Neurological Exam: MENTAL STATUS including orientation to time, place, person, recent and remote memory, attention span and concentration, language, and fund of knowledge is normal.  Speech is not dysarthric.  CRANIAL NERVES: II:  No visual field defects.   III-IV-VI: Pupils equal round and reactive to light.  Normal conjugate, extra-ocular eye movements in all directions of gaze.  No nystagmus.  No ptosis.   V:  Normal facial sensation.    VII:  Normal facial symmetry and movements.   VIII:  Normal hearing and vestibular function.   IX-X:  Normal palatal movement.   XI:  Normal shoulder shrug and head rotation.   XII:  Normal tongue strength and range of motion, no deviation or fasciculation.  MOTOR:  No atrophy, fasciculations or abnormal movements.  No pronator drift.   Upper Extremity:  Right  Left  Deltoid  5/5   5/5   Biceps  5/5   5/5   Triceps  5/5   5/5   Infraspinatus 5/5  5/5  Medial pectoralis 5/5  5/5  Wrist extensors  5/5   5/5   Wrist flexors  5/5   5/5   Finger extensors  5/5   5/5   Finger flexors  5/5   5/5   Dorsal interossei  5/5   5/5   Abductor pollicis  5/5   5/5   Tone (Ashworth scale)  0  0   Lower Extremity:  Right  Left  Hip flexors  5/5   5/5   Hip extensors  5/5   5/5   Adductor 5/5  5/5  Abductor 5/5  5/5  Knee flexors  5/5   5/5   Knee extensors  5/5   5/5   Dorsiflexors  5/5   5/5   Plantarflexors  5/5   5/5   Toe extensors  5/5  5/5   Toe flexors  5/5   5/5   Tone  (Ashworth scale)  0  0   MSRs:  Right        Left                  brachioradialis 2+  2+  biceps 2+  2+  triceps 2+  2+  patellar 2+  2+  ankle jerk 2+  2+  Hoffman no  no  plantar response down  down   SENSORY:  Normal and symmetric perception of light touch, pinprick, vibration, and proprioception.  Romberg's sign absent.   COORDINATION/GAIT: Normal finger-to- nose-finger.  Intact rapid alternating movements bilaterally.  Gait narrow based and stable. Tandem and stressed gait intact.    IMPRESSION: Dysesthesia, described as "prickly sensation".  He denies numbness/tingling and neurological exam is normal, making neuropathy unlikely.  Migratory symptoms are not classic for primary nerve pathology. Patient reassured.  PLAN/RECOMMENDATIONS:  Check vitamin B12 NCS/EMG was declined, as symptoms are improving.  If symptoms get worse, electrodiagnostic testing will be the next step  Return to clinic as needed   Thank you for allowing me to participate in patient's care.  If I can answer any additional questions, I would be pleased to do so.    Sincerely,    Usman Millett K. Posey Pronto, DO

## 2020-05-02 ENCOUNTER — Other Ambulatory Visit: Payer: Self-pay

## 2020-05-02 ENCOUNTER — Ambulatory Visit: Payer: Medicare PPO | Admitting: Allergy

## 2020-05-02 ENCOUNTER — Encounter: Payer: Self-pay | Admitting: Allergy

## 2020-05-02 VITALS — BP 106/72 | HR 94 | Temp 97.0°F | Resp 17 | Ht 72.64 in | Wt 187.6 lb

## 2020-05-02 DIAGNOSIS — T50905D Adverse effect of unspecified drugs, medicaments and biological substances, subsequent encounter: Secondary | ICD-10-CM | POA: Diagnosis not present

## 2020-05-02 DIAGNOSIS — L299 Pruritus, unspecified: Secondary | ICD-10-CM

## 2020-05-02 DIAGNOSIS — J3089 Other allergic rhinitis: Secondary | ICD-10-CM

## 2020-05-02 NOTE — Progress Notes (Signed)
Follow Up Note  RE: Steven Wolf MRN: [id number] DOB: [birth year] Date of Office Visit: 05/02/2020  Referring provider: Binnie Rail, MD Primary care provider: Binnie Rail, MD  Chief Complaint: Allergic Rhinitis   History of Present Illness: I had the pleasure of seeing Steven Wolf for a follow up visit at the [facility] on 05/02/2020. He is a 65 y.o. male, who is being followed for pruritus, allergic rhinitis, drug reaction. His previous allergy office visit was on 12/28/2019 with Dr. Maudie Mercury. Today is a regular follow up visit.  Pruritus Itching has improved significantly and he did a few things that may have caused this improvement.  He is using Vanicream as a moisturizer which helps the most.  Tried zyrtec but it caused drowsiness so he stopped.  Taking Claritin daily and not sure if it's helping. Claritin does not cause drowsiness though.    Itching improved he went to visit his sister. Only thing different he was doing was NOT eating microwaveable popcorn during this trip. He stopped eating popcorn but not sure if helping. Eats other forms of corn without any issues.   Patient saw neurologist as well and work up was unremarkable.   No dust mite covers yet.  Itching worse after showers and at night.   Assessment and Plan: Steven Wolf is a 65 y.o. male with: Pruritus Past history - Pruritus for 4-5 weeks with no associated rash. Started on the bottom of his foot and now occurring anywhere on his body. Worse in the mornings but occurs daily with no known triggers. Tried Claritin with minimal benefit. 12/13/2019 CBC diff, CMP unremarkable. Denies changes in diet, medications or personal care products. 2021 skin testing showed:Positive to 2 molds and dust mites. Borderline positive to cockroaches. Negative to common foods.  Interim history - improvement with daily moisturization. Zyrtec caused drowsiness. neurology work up unremarkable.   Continue proper skin  care as below - continue daily moisturization.   Continue environmental control measures as below - get dust mite covers.   May use over the counter antihistamines such as loratadine at night if needed  You can try to eat popcorn again and see if it worsens the itching.   Other allergic rhinitis Past history - Mild rhinitis symptoms during the fall.  2021 skin testing showed: Positive to 2 molds and dust mites.  Borderline positive to cockroaches.   Continue environmental control measures as below.  May use over the counter antihistamines such as Claritin (loratadine), Allegra (fexofenadine), or Xyzal (levocetirizine) daily as needed.  Drug reaction Past history - Allergic to penicillin as a child and unsure of exact reactions.  Consider penicillin skin testing/drug challenge in the future.  If interested we can schedule drug challenge to penicillin. You must be off antihistamines for 3-5 days before. Must be in good health and not ill. Plan on being in the office for 2-3 hours and must bring in the drug you want to do the oral challenge for - will send in prescription to pick up a few days before. You must call to schedule an appointment and specify it's for a drug challenge.   More than 90% of patients outgrow their penicillin allergy.   Return if symptoms worsen or fail to improve.  Diagnostics: None.  Medication List:  Current Outpatient Medications  Medication Sig Dispense Refill  . aspirin 81 MG tablet Take 81 mg by mouth daily.    . Cholecalciferol (VITAMIN D) 2000 UNITS CAPS  Take 2,000 Units by mouth daily.    . Coenzyme Q10 (COQ10) 100 MG CAPS Take 1 tablet by mouth daily.    . fish oil-omega-3 fatty acids 1000 MG capsule Take 1 g by mouth 2 (two) times daily.    Marland Kitchen FLUoxetine (PROZAC) 10 MG tablet TAKE 1 TABLET BY MOUTH EVERY DAY 90 tablet 0  . loratadine (CLARITIN) 10 MG tablet Take 10 mg by mouth daily.    . OMEGA-3 KRILL OIL PO Take 1 capsule by mouth daily.    .  valsartan (DIOVAN) 80 MG tablet TAKE 1 TABLET BY MOUTH EVERY DAY 90 tablet 1   No current facility-administered medications for this visit.   Allergies: Allergies  Allergen Reactions  . Ramipril     REACTION: elevated liver enzymes  . Penicillins     ? Reaction (as child)   I reviewed his past medical history, social history, family history, and environmental history and no significant changes have been reported from his previous visit.  Review of Systems  Constitutional: Negative for appetite change, chills, fever and unexpected weight change.  HENT: Negative for congestion, rhinorrhea and sneezing.   Eyes: Negative for itching.  Respiratory: Negative for cough, chest tightness, shortness of breath and wheezing.   Cardiovascular: Negative for chest pain.  Gastrointestinal: Negative for abdominal pain.  Genitourinary: Negative for difficulty urinating.  Skin: Negative for rash.       itching  Allergic/Immunologic: Positive for environmental allergies. Negative for food allergies.  Neurological: Negative for headaches.   Objective: BP 106/72 (BP Location: Left Arm, Patient Position: Sitting, Cuff Size: Normal)   Pulse 94   Temp (!) 97 F (36.1 C) (Temporal)   Resp 17   Ht 6' 0.64" (1.845 m)   Wt 187 lb 9.6 oz (85.1 kg)   SpO2 96%   BMI 25.00 kg/m  Body mass index is 25 kg/m. Physical Exam Vitals and nursing note reviewed.  Constitutional:      Appearance: Normal appearance. He is well-developed.  HENT:     Head: Normocephalic and atraumatic.     Right Ear: External ear normal.     Left Ear: External ear normal.     Nose: Nose normal.     Mouth/Throat:     Mouth: Mucous membranes are moist.     Pharynx: Oropharynx is clear.  Eyes:     Conjunctiva/sclera: Conjunctivae normal.  Cardiovascular:     Rate and Rhythm: Normal rate and regular rhythm.     Heart sounds: Normal heart sounds. No murmur heard. No friction rub. No gallop.   Pulmonary:     Effort: Pulmonary  effort is normal.     Breath sounds: Normal breath sounds. No wheezing, rhonchi or rales.  Abdominal:     Palpations: Abdomen is soft.  Musculoskeletal:     Cervical back: Neck supple.  Skin:    General: Skin is warm.     Findings: No rash.  Neurological:     Mental Status: He is alert and oriented to person, place, and time.  Psychiatric:        Behavior: Behavior normal.    Previous notes and tests were reviewed. The plan was reviewed with the patient/family, and all questions/concerned were addressed.  It was my pleasure to see Steven Wolf today and participate in his care. Please feel free to contact me with any questions or concerns.  Sincerely,  Rexene Alberts, DO Allergy & Immunology  [facility] office: [phone]  [place] office: [phone]

## 2020-05-02 NOTE — Assessment & Plan Note (Signed)
Past history - Mild rhinitis symptoms during the fall.  2021 skin testing showed: Positive to 2 molds and dust mites.  Borderline positive to cockroaches.   Continue environmental control measures as below.  May use over the counter antihistamines such as Claritin (loratadine), Allegra (fexofenadine), or Xyzal (levocetirizine) daily as needed.

## 2020-05-02 NOTE — Patient Instructions (Addendum)
Itching:   Continue proper skin care as below - continue daily moisturization.   Continue environmental control measures as below - get dust mite covers.   May use over the counter antihistamines such as loratadine at night if needed  You can try to eat popcorn again and see if it worsens the itching.   Drug allergy:  Consider penicillin skin testing/drug challenge in the future.  If interested we can schedule drug challenge to penicillin. You must be off antihistamines for 3-5 days before. Must be in good health and not ill. Plan on being in the office for 2-3 hours and must bring in the drug you want to do the oral challenge for - will send in prescription to pick up a few days before. You must call to schedule an appointment and specify it's for a drug challenge.   More than 90% of patients outgrow their penicillin allergy.   Follow up if need.    Skin care recommendations  Bath time: . Always use lukewarm water. AVOID very hot or cold water. Marland Kitchen Keep bathing time to 5-10 minutes. . Do NOT use bubble bath. . Use a mild soap and use just enough to wash the dirty areas. . Do NOT scrub skin vigorously.  . After bathing, pat dry your skin with a towel. Do NOT rub or scrub the skin.  Moisturizers and prescriptions:  . ALWAYS apply moisturizers immediately after bathing (within 3 minutes). This helps to lock-in moisture. . Use the moisturizer several times a day over the whole body. Kermit Balo summer moisturizers include: Aveeno, CeraVe, Cetaphil. Kermit Balo winter moisturizers include: Aquaphor, Vaseline, Cerave, Cetaphil, Eucerin, Vanicream. . When using moisturizers along with medications, the moisturizer should be applied about one hour after applying the medication to prevent diluting effect of the medication or moisturize around where you applied the medications. When not using medications, the moisturizer can be continued twice daily as maintenance.  Laundry and clothing: . Avoid  laundry products with added color or perfumes. . Use unscented hypo-allergenic laundry products such as Tide free, Cheer free & gentle, and All free and clear.  . If the skin still seems dry or sensitive, you can try double-rinsing the clothes. . Avoid tight or scratchy clothing such as wool. . Do not use fabric softeners or dyer sheets.  Control of House Dust Mite Allergen . Dust mite allergens are a common trigger of allergy and asthma symptoms. While they can be found throughout the house, these microscopic creatures thrive in warm, humid environments such as bedding, upholstered furniture and carpeting. . Because so much time is spent in the bedroom, it is essential to reduce mite levels there.  . Encase pillows, mattresses, and box springs in special allergen-proof fabric covers or airtight, zippered plastic covers.  . Bedding should be washed weekly in hot water (130 F) and dried in a hot dryer. Allergen-proof covers are available for comforters and pillows that can't be regularly washed.  Wendee Copp the allergy-proof covers every few months. Minimize clutter in the bedroom. Keep pets out of the bedroom.  Marland Kitchen Keep humidity less than 50% by using a dehumidifier or air conditioning. You can buy a humidity measuring device called a hygrometer to monitor this.  . If possible, replace carpets with hardwood, linoleum, or washable area rugs. If that's not possible, vacuum frequently with a vacuum that has a HEPA filter. . Remove all upholstered furniture and non-washable window drapes from the bedroom. . Remove all non-washable stuffed toys  from the bedroom.  Wash stuffed toys weekly.

## 2020-05-02 NOTE — Assessment & Plan Note (Signed)
Past history - Pruritus for 4-5 weeks with no associated rash. Started on the bottom of his foot and now occurring anywhere on his body. Worse in the mornings but occurs daily with no known triggers. Tried Claritin with minimal benefit. 12/13/2019 CBC diff, CMP unremarkable. Denies changes in diet, medications or personal care products. 2021 skin testing showed:Positive to 2 molds and dust mites. Borderline positive to cockroaches. Negative to common foods.  Interim history - improvement with daily moisturization. Zyrtec caused drowsiness. neurology work up unremarkable.   Continue proper skin care as below - continue daily moisturization.   Continue environmental control measures as below - get dust mite covers.   May use over the counter antihistamines such as loratadine at night if needed  You can try to eat popcorn again and see if it worsens the itching.

## 2020-05-02 NOTE — Assessment & Plan Note (Signed)
Past history - Allergic to penicillin as a child and unsure of exact reactions.  Consider penicillin skin testing/drug challenge in the future.  If interested we can schedule drug challenge to penicillin. You must be off antihistamines for 3-5 days before. Must be in good health and not ill. Plan on being in the office for 2-3 hours and must bring in the drug you want to do the oral challenge for - will send in prescription to pick up a few days before. You must call to schedule an appointment and specify it's for a drug challenge.   More than 90% of patients outgrow their penicillin allergy.

## 2020-05-06 ENCOUNTER — Other Ambulatory Visit: Payer: Self-pay | Admitting: Internal Medicine

## 2020-06-02 ENCOUNTER — Ambulatory Visit: Payer: Medicare HMO | Admitting: Neurology

## 2020-06-08 NOTE — Patient Instructions (Addendum)
Blood work was ordered.     Pneumovax immunization administered today.    Medications changes include :   none  Your prescription(s) have been submitted to your pharmacy. Please take as directed and contact our office if you believe you are having problem(s) with the medication(s).   Please followup in 1 year   Health Maintenance, Male Adopting a healthy lifestyle and getting preventive care are important in promoting health and wellness. Ask your health care provider about:  The right schedule for you to have regular tests and exams.  Things you can do on your own to prevent diseases and keep yourself healthy. What should I know about diet, weight, and exercise? Eat a healthy diet  Eat a diet that includes plenty of vegetables, fruits, low-fat dairy products, and lean protein.  Do not eat a lot of foods that are high in solid fats, added sugars, or sodium.   Maintain a healthy weight Body mass index (BMI) is a measurement that can be used to identify possible weight problems. It estimates body fat based on height and weight. Your health care provider can help determine your BMI and help you achieve or maintain a healthy weight. Get regular exercise Get regular exercise. This is one of the most important things you can do for your health. Most adults should:  Exercise for at least 150 minutes each week. The exercise should increase your heart rate and make you sweat (moderate-intensity exercise).  Do strengthening exercises at least twice a week. This is in addition to the moderate-intensity exercise.  Spend less time sitting. Even light physical activity can be beneficial. Watch cholesterol and blood lipids Have your blood tested for lipids and cholesterol at 67 years of age, then have this test every 5 years. You may need to have your cholesterol levels checked more often if:  Your lipid or cholesterol levels are high.  You are older than 67 years of age.  You are at  high risk for heart disease. What should I know about cancer screening? Many types of cancers can be detected early and may often be prevented. Depending on your health history and family history, you may need to have cancer screening at various ages. This may include screening for:  Colorectal cancer.  Prostate cancer.  Skin cancer.  Lung cancer. What should I know about heart disease, diabetes, and high blood pressure? Blood pressure and heart disease  High blood pressure causes heart disease and increases the risk of stroke. This is more likely to develop in people who have high blood pressure readings, are of African descent, or are overweight.  Talk with your health care provider about your target blood pressure readings.  Have your blood pressure checked: ? Every 3-5 years if you are 2-67 years of age. ? Every year if you are 67 years old or older.  If you are between the ages of 14 and 34 and are a current or former smoker, ask your health care provider if you should have a one-time screening for abdominal aortic aneurysm (AAA). Diabetes Have regular diabetes screenings. This checks your fasting blood sugar level. Have the screening done:  Once every three years after age 67 if you are at a normal weight and have a low risk for diabetes.  More often and at a younger age if you are overweight or have a high risk for diabetes. What should I know about preventing infection? Hepatitis B If you have a higher risk for hepatitis B,  you should be screened for this virus. Talk with your health care provider to find out if you are at risk for hepatitis B infection. Hepatitis C Blood testing is recommended for:  Everyone born from [birth year] through 1965.  Anyone with known risk factors for hepatitis C. Sexually transmitted infections (STIs)  You should be screened each year for STIs, including gonorrhea and chlamydia, if: ? You are sexually active and are younger than 67 years of  age. ? You are older than 66 years of age and your health care provider tells you that you are at risk for this type of infection. ? Your sexual activity has changed since you were last screened, and you are at increased risk for chlamydia or gonorrhea. Ask your health care provider if you are at risk.  Ask your health care provider about whether you are at high risk for HIV. Your health care provider may recommend a prescription medicine to help prevent HIV infection. If you choose to take medicine to prevent HIV, you should first get tested for HIV. You should then be tested every 3 months for as long as you are taking the medicine. Follow these instructions at home: Lifestyle  Do not use any products that contain nicotine or tobacco, such as cigarettes, e-cigarettes, and chewing tobacco. If you need help quitting, ask your health care provider.  Do not use street drugs.  Do not share needles.  Ask your health care provider for help if you need support or information about quitting drugs. Alcohol use  Do not drink alcohol if your health care provider tells you not to drink.  If you drink alcohol: ? Limit how much you have to 0-2 drinks a day. ? Be aware of how much alcohol is in your drink. In the [place] one drink equals one 12 oz bottle of beer (355 mL), one 5 oz glass of wine (148 mL), or one 1 oz glass of hard liquor (44 mL). General instructions  Schedule regular health, dental, and eye exams.  Stay current with your vaccines.  Tell your health care provider if: ? You often feel depressed. ? You have ever been abused or do not feel safe at home. Summary  Adopting a healthy lifestyle and getting preventive care are important in promoting health and wellness.  Follow your health care provider's instructions about healthy diet, exercising, and getting tested or screened for diseases.  Follow your health care provider's instructions on monitoring your cholesterol and blood  pressure. This information is not intended to replace advice given to you by your health care provider. Make sure you discuss any questions you have with your health care provider. Document Revised: 03/04/2018 Document Reviewed: 03/04/2018 Elsevier Patient Education  2021 [employer].

## 2020-06-08 NOTE — Progress Notes (Signed)
Subjective:    Patient ID: Steven Wolf, male    DOB: [birth year], 65 y.o.   MRN: [id number]  HPI He is here for a physical exam.   His itching is better - using vanicream daily.   He also stopped eating bagged popcorn.  He is not sure if it was from the popcorn he was eating every single night more just dry skin.  He has no other concerns.  Medications and allergies reviewed with patient and updated if appropriate.  Patient Active Problem List   Diagnosis Date Noted  . Other allergic rhinitis 12/28/2019  . Drug reaction 12/28/2019  . Pruritus 12/13/2019  . Pain in finger of left hand 11/13/2018  . OSA on CPAP 06/28/2016  . Anxiety 04/07/2015  . Prediabetes 11/11/2013  . DIVERTICULOSIS, COLON 08/11/2009  . BPH (benign prostatic hyperplasia) 06/15/2008  . HEMATURIA 05/06/2008  . Hyperlipidemia 09/10/2007  . Essential hypertension, benign 04/17/2007  . COLONIC POLYPS, HX OF 04/17/2007  . SYNCOPE 04/03/2007    Current Outpatient Medications on File Prior to Visit  Medication Sig Dispense Refill  . aspirin 81 MG tablet Take 81 mg by mouth daily.    . Cholecalciferol (VITAMIN D) 2000 UNITS CAPS Take 2,000 Units by mouth daily.    . Coenzyme Q10 (COQ10) 100 MG CAPS Take 1 tablet by mouth daily.    . fish oil-omega-3 fatty acids 1000 MG capsule Take 1 g by mouth 2 (two) times daily.    Marland Kitchen FLUoxetine (PROZAC) 10 MG tablet TAKE 1 TABLET BY MOUTH EVERY DAY 90 tablet 0  . loratadine (CLARITIN) 10 MG tablet Take 10 mg by mouth daily.    . OMEGA-3 KRILL OIL PO Take 1 capsule by mouth daily.    . valsartan (DIOVAN) 80 MG tablet TAKE 1 TABLET BY MOUTH EVERY DAY 90 tablet 1   No current facility-administered medications on file prior to visit.    Past Medical History:  Diagnosis Date  . Anxiety   . Benign prostatic hypertrophy   . Diverticulosis of colon   . Gilbert syndrome   . Hypertension   . Microscopic hematuria   . Nephrolithiasis     X 2; Dr Serita Butcher  . Sleep  apnea    USES CPAP  . Syncope 02/2007   NOCTURNAL POST MICTURTION  . Urticaria     Past Surgical History:  Procedure Laterality Date  . APPENDECTOMY    . COLONOSCOPY  2010   NORMAL(pt states 2 other colonoscopies in the past)  . COLONOSCOPY W/ POLYPECTOMY  2002   Tics 2005 & 2010; due 2020, Dr Sharlett Iles  . LIVER BIOPSY     X 1 for elevated LFTs with NEGATIVE  hepatitis serologies  . TONSILLECTOMY    . TONSILLECTOMY     PT STATES HAS HAD IT TWICE  . UPPER GASTROINTESTINAL ENDOSCOPY  1995    Social History   Socioeconomic History  . Marital status: Married    Spouse name: Not on file  . Number of children: 0  . Years of education: Not on file  . Highest education level: Not on file  Occupational History  . Not on file  Tobacco Use  . Smoking status: Never Smoker  . Smokeless tobacco: Never Used  . Tobacco comment: Second hand smoke  Vaping Use  . Vaping Use: Never used  Substance and Sexual Activity  . Alcohol use: No  . Drug use: No  . Sexual activity: Not on file  Other Topics  Concern  . Not on file  Social History Narrative   Right Handed   Lives in a two story home    Drinks some caffeine    Social Determinants of Health   Financial Resource Strain: Not on file  Food Insecurity: Not on file  Transportation Needs: Not on file  Physical Activity: Not on file  Stress: Not on file  Social Connections: Not on file    Family History  Problem Relation Age of Onset  . Breast cancer Mother   . Rheumatologic disease Mother        RA  . Heart disease Father        PCV;MI ? 4  . Polycythemia Father   . Leukemia Paternal Uncle   . Polycythemia Paternal Uncle   . Heart attack Paternal Grandfather        > 55  . Stroke Maternal Grandmother        . 65  . Sleep apnea Sister   . Diabetes Neg Hx   . Colon cancer Neg Hx   . Colon polyps Neg Hx   . Esophageal cancer Neg Hx   . Stomach cancer Neg Hx   . Rectal cancer Neg Hx     Review of Systems   Constitutional: Negative for chills and fever.  Eyes: Negative for visual disturbance.  Respiratory: Negative for cough, shortness of breath and wheezing.   Cardiovascular: Negative for chest pain, palpitations and leg swelling.  Gastrointestinal: Positive for anal bleeding (hemorhoidal) and constipation (mild). Negative for abdominal pain, blood in stool, diarrhea and nausea.       No gerd  Genitourinary: Negative for difficulty urinating, dysuria and hematuria.       Weaker stream  Musculoskeletal: Negative for arthralgias and back pain.  Skin: Negative for color change and rash.  Neurological: Positive for light-headedness (rae with bending over and getting up). Negative for headaches.  Psychiatric/Behavioral: Positive for dysphoric mood. The patient is nervous/anxious.        Objective:   Vitals:   06/09/20 0901  BP: 108/82  Pulse: 68  Temp: 98 F (36.7 C)  SpO2: 96%   Filed Weights   06/09/20 0901  Weight: 178 lb (80.7 kg)   Body mass index is 23.72 kg/m.  BP Readings from Last 3 Encounters:  06/09/20 108/82  05/02/20 106/72  04/24/20 107/77    Wt Readings from Last 3 Encounters:  06/09/20 178 lb (80.7 kg)  05/02/20 187 lb 9.6 oz (85.1 kg)  04/24/20 189 lb (85.7 kg)    Depression screen [facility] 2/9 06/09/2020 11/13/2018 06/02/2018 05/30/2017  Decreased Interest 1 0 0 0  Down, Depressed, Hopeless 1 0 0 0  PHQ - 2 Score 2 0 0 0  Altered sleeping 1 - - -  Tired, decreased energy 1 - - -  Change in appetite 1 - - -  Feeling bad or failure about yourself  0 - - -  Trouble concentrating 1 - - -  Moving slowly or fidgety/restless 0 - - -  Suicidal thoughts 0 - - -  PHQ-9 Score 6 - - -  Difficult doing work/chores Not difficult at all - - -    GAD 7 : Generalized Anxiety Score 06/09/2020  Nervous, Anxious, on Edge 1  Control/stop worrying 1  Worry too much - different things 1  Trouble relaxing 1  Restless 0  Easily annoyed or irritable 1  Afraid - awful might  happen 0  Total GAD 7 Score 5  Anxiety Difficulty Not difficult at all       Physical Exam Constitutional: He appears well-developed and well-nourished. No distress.  HENT:  Head: Normocephalic and atraumatic.  Right Ear: External ear normal.  Left Ear: External ear normal.  Mouth/Throat: Oropharynx is clear and moist.  Normal ear canals and TM b/l  Eyes: Conjunctivae and EOM are normal.  Neck: Neck supple. No tracheal deviation present. No thyromegaly present.  No carotid bruit  Cardiovascular: Normal rate, regular rhythm, normal heart sounds and intact distal pulses.   No murmur heard. Pulmonary/Chest: Effort normal and breath sounds normal. No respiratory distress. He has no wheezes. He has no rales.  Abdominal: Soft. He exhibits no distension. There is no tenderness.  Genitourinary: deferred  Musculoskeletal: He exhibits no edema.  Lymphadenopathy:   He has no cervical adenopathy.  Skin: Skin is warm and dry. He is not diaphoretic.  Psychiatric: He has a normal mood and affect. His behavior is normal.         Assessment & Plan:   Physical exam: Screening blood work  ordered Immunizations  Pneumovax today, had covid booster, discussed shingrix Colonoscopy   Up to date  Eye exams   Up to date  Exercise   walking Weight  normal Substance abuse   none Sees derm annually   Screened for depression using the PHQ 9 scale.  His score was 6 indicating mild-moderate depression.  He feels this is controlled and manageable and does not feel any adjustment of his current medication is necessary.   See Problem List for Assessment and Plan of chronic medical problems.   This visit occurred during the SARS-CoV-2 public health emergency.  Safety protocols were in place, including screening questions prior to the visit, additional usage of staff PPE, and extensive cleaning of exam room while observing appropriate contact time as indicated for disinfecting solutions.

## 2020-06-09 ENCOUNTER — Ambulatory Visit (INDEPENDENT_AMBULATORY_CARE_PROVIDER_SITE_OTHER): Payer: Medicare PPO | Admitting: Internal Medicine

## 2020-06-09 ENCOUNTER — Encounter: Payer: Self-pay | Admitting: Internal Medicine

## 2020-06-09 ENCOUNTER — Other Ambulatory Visit: Payer: Self-pay

## 2020-06-09 VITALS — BP 108/82 | HR 68 | Temp 98.0°F | Ht 72.64 in | Wt 178.0 lb

## 2020-06-09 DIAGNOSIS — Z0000 Encounter for general adult medical examination without abnormal findings: Secondary | ICD-10-CM | POA: Diagnosis not present

## 2020-06-09 DIAGNOSIS — Z23 Encounter for immunization: Secondary | ICD-10-CM

## 2020-06-09 DIAGNOSIS — F419 Anxiety disorder, unspecified: Secondary | ICD-10-CM

## 2020-06-09 DIAGNOSIS — R7303 Prediabetes: Secondary | ICD-10-CM | POA: Diagnosis not present

## 2020-06-09 DIAGNOSIS — E7849 Other hyperlipidemia: Secondary | ICD-10-CM | POA: Diagnosis not present

## 2020-06-09 DIAGNOSIS — Z9989 Dependence on other enabling machines and devices: Secondary | ICD-10-CM

## 2020-06-09 DIAGNOSIS — Z125 Encounter for screening for malignant neoplasm of prostate: Secondary | ICD-10-CM

## 2020-06-09 DIAGNOSIS — I10 Essential (primary) hypertension: Secondary | ICD-10-CM | POA: Diagnosis not present

## 2020-06-09 DIAGNOSIS — G4733 Obstructive sleep apnea (adult) (pediatric): Secondary | ICD-10-CM

## 2020-06-09 LAB — LIPID PANEL
Cholesterol: 183 mg/dL (ref 0–200)
HDL: 47.5 mg/dL (ref >39.00)
LDL Cholesterol: 113 mg/dL — ABNORMAL HIGH (ref 0–99)
NonHDL: 135.21
Total CHOL/HDL Ratio: 4
Triglycerides: 109 mg/dL (ref 0.0–149.0)
VLDL: 21.8 mg/dL (ref 0.0–40.0)

## 2020-06-09 LAB — CBC WITH DIFFERENTIAL/PLATELET
Basophils Absolute: 0.1 10*3/uL (ref 0.0–0.1)
Basophils Relative: 1.9 % (ref 0.0–3.0)
Eosinophils Absolute: 0.1 10*3/uL (ref 0.0–0.7)
Eosinophils Relative: 2.8 % (ref 0.0–5.0)
HCT: 46.8 % (ref 39.0–52.0)
Hemoglobin: 16 g/dL (ref 13.0–17.0)
Lymphocytes Relative: 36.7 % (ref 12.0–46.0)
Lymphs Abs: 1.5 10*3/uL (ref 0.7–4.0)
MCHC: 34.2 g/dL (ref 30.0–36.0)
MCV: 90.7 fl (ref 78.0–100.0)
Monocytes Absolute: 0.5 10*3/uL (ref 0.1–1.0)
Monocytes Relative: 11.5 % (ref 3.0–12.0)
Neutro Abs: 2 10*3/uL (ref 1.4–7.7)
Neutrophils Relative %: 47.1 % (ref 43.0–77.0)
Platelets: 197 10*3/uL (ref 150.0–400.0)
RBC: 5.15 Mil/uL (ref 4.22–5.81)
RDW: 13.4 % (ref 11.5–15.5)
WBC: 4.2 10*3/uL (ref 4.0–10.5)

## 2020-06-09 LAB — COMPREHENSIVE METABOLIC PANEL
ALT: 24 U/L (ref 0–53)
AST: 22 U/L (ref 0–37)
Albumin: 4.5 g/dL (ref 3.5–5.2)
Alkaline Phosphatase: 68 U/L (ref 39–117)
BUN: 18 mg/dL (ref 6–23)
CO2: 31 mEq/L (ref 19–32)
Calcium: 9.4 mg/dL (ref 8.4–10.5)
Chloride: 103 mEq/L (ref 96–112)
Creatinine, Ser: 1.15 mg/dL (ref 0.40–1.50)
GFR: 66.15 mL/min (ref >60.00)
Glucose, Bld: 86 mg/dL (ref 70–99)
Potassium: 4.3 mEq/L (ref 3.5–5.1)
Sodium: 140 mEq/L (ref 135–145)
Total Bilirubin: 1.5 mg/dL — ABNORMAL HIGH (ref 0.2–1.2)
Total Protein: 7 g/dL (ref 6.0–8.3)

## 2020-06-09 LAB — PSA: PSA: 1.47 ng/mL (ref 0.10–4.00)

## 2020-06-09 LAB — TSH: TSH: 4.26 u[IU]/mL (ref 0.35–4.50)

## 2020-06-09 LAB — HEMOGLOBIN A1C: Hgb A1c MFr Bld: 5.5 % (ref 4.6–6.5)

## 2020-06-09 MED ORDER — VALSARTAN 80 MG PO TABS: 80.0000 mg | ORAL_TABLET | Freq: Every day | ORAL | 3 refills | Status: DC

## 2020-06-09 MED ORDER — FLUOXETINE HCL 10 MG PO TABS: 10.0000 mg | ORAL_TABLET | Freq: Every day | ORAL | 3 refills | Status: DC

## 2020-06-09 NOTE — Assessment & Plan Note (Signed)
Chronic Check lipid panel  Lifestyle controlled Regular exercise and healthy diet encouraged  

## 2020-06-09 NOTE — Assessment & Plan Note (Signed)
Chronic Check a1c Low sugar / carb diet Stressed regular exercise  

## 2020-06-09 NOTE — Addendum Note (Signed)
Addended by: Marcina Millard on: 06/09/2020 03:31 PM   Modules accepted: Orders

## 2020-06-09 NOTE — Assessment & Plan Note (Signed)
Chronic Using cpap nightly 

## 2020-06-09 NOTE — Assessment & Plan Note (Signed)
Chronic BP well controlled Continue valsartan 80 mg daily cmp

## 2020-06-09 NOTE — Assessment & Plan Note (Addendum)
Chronic GAD-7 score is 5 became mild anxiety He feels his anxiety is controlled and fairly stable and he is happy with his current medication Continue fluoxetine 10 mg daily

## 2020-11-10 ENCOUNTER — Telehealth: Payer: Self-pay | Admitting: Internal Medicine

## 2020-11-10 NOTE — Telephone Encounter (Signed)
Called patient to schedule AWV, patient declines at this time.  He states that he does not think he needs it as everything is up to date.  Advised patient he could further discuss with PCP and if he changed his mind we could reschedule at a later time.

## 2020-11-20 DIAGNOSIS — H524 Presbyopia: Secondary | ICD-10-CM | POA: Diagnosis not present

## 2020-11-20 DIAGNOSIS — H2513 Age-related nuclear cataract, bilateral: Secondary | ICD-10-CM | POA: Diagnosis not present

## 2020-11-20 DIAGNOSIS — H35013 Changes in retinal vascular appearance, bilateral: Secondary | ICD-10-CM | POA: Diagnosis not present

## 2020-11-20 DIAGNOSIS — H25013 Cortical age-related cataract, bilateral: Secondary | ICD-10-CM | POA: Diagnosis not present

## 2021-03-16 DIAGNOSIS — K1121 Acute sialoadenitis: Secondary | ICD-10-CM | POA: Diagnosis not present

## 2021-05-24 DIAGNOSIS — L820 Inflamed seborrheic keratosis: Secondary | ICD-10-CM | POA: Diagnosis not present

## 2021-05-24 DIAGNOSIS — L821 Other seborrheic keratosis: Secondary | ICD-10-CM | POA: Diagnosis not present

## 2021-05-24 DIAGNOSIS — D1801 Hemangioma of skin and subcutaneous tissue: Secondary | ICD-10-CM | POA: Diagnosis not present

## 2021-05-24 DIAGNOSIS — L578 Other skin changes due to chronic exposure to nonionizing radiation: Secondary | ICD-10-CM | POA: Diagnosis not present

## 2021-05-24 DIAGNOSIS — L814 Other melanin hyperpigmentation: Secondary | ICD-10-CM | POA: Diagnosis not present

## 2021-05-24 DIAGNOSIS — Z23 Encounter for immunization: Secondary | ICD-10-CM | POA: Diagnosis not present

## 2021-05-24 DIAGNOSIS — L738 Other specified follicular disorders: Secondary | ICD-10-CM | POA: Diagnosis not present

## 2021-06-13 ENCOUNTER — Encounter: Payer: Self-pay | Admitting: Internal Medicine

## 2021-06-13 NOTE — Patient Instructions (Addendum)
? ?A Ct scan was ordered to evaluate your heart artery ? ? ?Blood work was ordered.   ? ? ?Medications changes include :  none  ? ? ?Your prescription(s) have been sent to your pharmacy.  ? ? ? ?Return in about 1 year (around 06/15/2022) for CPE. ? ? ? ? ?Health Maintenance, Male ?Adopting a healthy lifestyle and getting preventive care are important in promoting health and wellness. Ask your health care provider about: ?The right schedule for you to have regular tests and exams. ?Things you can do on your own to prevent diseases and keep yourself healthy. ?What should I know about diet, weight, and exercise? ?Eat a healthy diet ? ?Eat a diet that includes plenty of vegetables, fruits, low-fat dairy products, and lean protein. ?Do not eat a lot of foods that are high in solid fats, added sugars, or sodium. ?Maintain a healthy weight ?Body mass index (BMI) is a measurement that can be used to identify possible weight problems. It estimates body fat based on height and weight. Your health care provider can help determine your BMI and help you achieve or maintain a healthy weight. ?Get regular exercise ?Get regular exercise. This is one of the most important things you can do for your health. Most adults should: ?Exercise for at least 150 minutes each week. The exercise should increase your heart rate and make you sweat (moderate-intensity exercise). ?Do strengthening exercises at least twice a week. This is in addition to the moderate-intensity exercise. ?Spend less time sitting. Even light physical activity can be beneficial. ?Watch cholesterol and blood lipids ?Have your blood tested for lipids and cholesterol at 68 years of age, then have this test every 5 years. ?You may need to have your cholesterol levels checked more often if: ?Your lipid or cholesterol levels are high. ?You are older than 68 years of age. ?You are at high risk for heart disease. ?What should I know about cancer screening? ?Many types of  cancers can be detected early and may often be prevented. Depending on your health history and family history, you may need to have cancer screening at various ages. This may include screening for: ?Colorectal cancer. ?Prostate cancer. ?Skin cancer. ?Lung cancer. ?What should I know about heart disease, diabetes, and high blood pressure? ?Blood pressure and heart disease ?High blood pressure causes heart disease and increases the risk of stroke. This is more likely to develop in people who have high blood pressure readings or are overweight. ?Talk with your health care provider about your target blood pressure readings. ?Have your blood pressure checked: ?Every 3-5 years if you are 71-34 years of age. ?Every year if you are 26 years old or older. ?If you are between the ages of 50 and 81 and are a current or former smoker, ask your health care provider if you should have a one-time screening for abdominal aortic aneurysm (AAA). ?Diabetes ?Have regular diabetes screenings. This checks your fasting blood sugar level. Have the screening done: ?Once every three years after age 24 if you are at a normal weight and have a low risk for diabetes. ?More often and at a younger age if you are overweight or have a high risk for diabetes. ?What should I know about preventing infection? ?Hepatitis B ?If you have a higher risk for hepatitis B, you should be screened for this virus. Talk with your health care provider to find out if you are at risk for hepatitis B infection. ?Hepatitis C ?Blood testing is  recommended for: ?Everyone born from [birth year] through 1967. ?Anyone with known risk factors for hepatitis C. ?Sexually transmitted infections (STIs) ?You should be screened each year for STIs, including gonorrhea and chlamydia, if: ?You are sexually active and are younger than 68 years of age. ?You are older than 68 years of age and your health care provider tells you that you are at risk for this type of infection. ?Your sexual  activity has changed since you were last screened, and you are at increased risk for chlamydia or gonorrhea. Ask your health care provider if you are at risk. ?Ask your health care provider about whether you are at high risk for HIV. Your health care provider may recommend a prescription medicine to help prevent HIV infection. If you choose to take medicine to prevent HIV, you should first get tested for HIV. You should then be tested every 3 months for as long as you are taking the medicine. ?Follow these instructions at home: ?Alcohol use ?Do not drink alcohol if your health care provider tells you not to drink. ?If you drink alcohol: ?Limit how much you have to 0-2 drinks a day. ?Know how much alcohol is in your drink. In the [place] one drink equals one 12 oz bottle of beer (355 mL), one 5 oz glass of wine (148 mL), or one 1? oz glass of hard liquor (44 mL). ?Lifestyle ?Do not use any products that contain nicotine or tobacco. These products include cigarettes, chewing tobacco, and vaping devices, such as e-cigarettes. If you need help quitting, ask your health care provider. ?Do not use street drugs. ?Do not share needles. ?Ask your health care provider for help if you need support or information about quitting drugs. ?General instructions ?Schedule regular health, dental, and eye exams. ?Stay current with your vaccines. ?Tell your health care provider if: ?You often feel depressed. ?You have ever been abused or do not feel safe at home. ?Summary ?Adopting a healthy lifestyle and getting preventive care are important in promoting health and wellness. ?Follow your health care provider's instructions about healthy diet, exercising, and getting tested or screened for diseases. ?Follow your health care provider's instructions on monitoring your cholesterol and blood pressure. ?This information is not intended to replace advice given to you by your health care provider. Make sure you discuss any questions you have  with your health care provider. ?Document Revised: 07/31/2020 Document Reviewed: 07/31/2020 ?Elsevier Patient Education ? [employer]. ? ?

## 2021-06-13 NOTE — Progress Notes (Signed)
? ? ?Subjective:  ? ? Patient ID: Steven Wolf, male    DOB: 17-Oct-1955, 66 y.o.   MRN: [id number] ? ? ?This visit occurred during the SARS-CoV-2 public health emergency.  Safety protocols were in place, including screening questions prior to the visit, additional usage of staff PPE, and extensive cleaning of exam room while observing appropriate contact time as indicated for disinfecting solutions. ? ? ? ?HPI ?Marqueze is here for  ?Chief Complaint  ?Patient presents with  ? Annual Exam  ? ? ?Had a salivary gland stone in December - went to urgent care and it did clear up with abx.   ? ? ?He eats a lot of dark chocolate and a recent report said there was a lot of lead and cadmium in it.  He wonders about if this is a concern.   ? ? ?Medications and allergies reviewed with patient and updated if appropriate. ? ? ?Current Outpatient Medications on File Prior to Visit  ?Medication Sig Dispense Refill  ? aspirin 81 MG tablet Take 81 mg by mouth daily.    ? Cholecalciferol (VITAMIN D) 2000 UNITS CAPS Take 2,000 Units by mouth daily.    ? Coenzyme Q10 (COQ10) 100 MG CAPS Take 1 tablet by mouth daily.    ? fish oil-omega-3 fatty acids 1000 MG capsule Take 1 g by mouth 2 (two) times daily.    ? loratadine (CLARITIN) 10 MG tablet Take 10 mg by mouth daily.    ? OMEGA-3 KRILL OIL PO Take 1 capsule by mouth daily.    ? ?No current facility-administered medications on file prior to visit.  ? ? ?Review of Systems  ?Constitutional:  Negative for chills and fever.  ?Eyes:  Negative for visual disturbance.  ?Respiratory:  Negative for cough, shortness of breath and wheezing.   ?Cardiovascular:  Negative for chest pain, palpitations and leg swelling.  ?Gastrointestinal:  Positive for anal bleeding (hemorrhoid). Negative for abdominal pain, blood in stool, constipation, diarrhea and nausea.  ?     Occ gerd  ?Genitourinary:  Positive for difficulty urinating. Negative for dysuria and hematuria.  ?Musculoskeletal:  Positive for  arthralgias (left shoulder intermittent). Negative for back pain.  ?Skin:  Negative for rash.  ?     Still has itching all over   ?Neurological:  Negative for light-headedness and headaches.  ?Psychiatric/Behavioral:  Negative for dysphoric mood. The patient is nervous/anxious (controlled).   ? ?   ?Objective:  ? ?Vitals:  ? 06/14/21 0816  ?BP: 106/78  ?Pulse: 70  ?Temp: 98.1 ?F (36.7 ?C)  ?SpO2: 97%  ? ?Filed Weights  ? 06/14/21 0816  ?Weight: 185 lb 12.8 oz (84.3 kg)  ? ?Body mass index is 24.76 kg/m?. ? ?BP Readings from Last 3 Encounters:  ?06/14/21 106/78  ?06/09/20 108/82  ?05/02/20 106/72  ? ? ?Wt Readings from Last 3 Encounters:  ?06/14/21 185 lb 12.8 oz (84.3 kg)  ?06/09/20 178 lb (80.7 kg)  ?05/02/20 187 lb 9.6 oz (85.1 kg)  ? ? ? ?  06/09/2020  ? 12:26 PM 11/13/2018  ?  1:54 PM 06/02/2018  ?  8:17 AM 05/30/2017  ?  9:04 AM  ?Depression screen PHQ 2/9  ?Decreased Interest 1 0 0 0  ?Down, Depressed, Hopeless 1 0 0 0  ?PHQ - 2 Score 2 0 0 0  ?Altered sleeping 1     ?Tired, decreased energy 1     ?Change in appetite 1     ?Feeling bad or failure  about yourself  0     ?Trouble concentrating 1     ?Moving slowly or fidgety/restless 0     ?Suicidal thoughts 0     ?PHQ-9 Score 6     ?Difficult doing work/chores Not difficult at all     ? ? ? ? ?  06/09/2020  ? 12:27 PM  ?GAD 7 : Generalized Anxiety Score  ?Nervous, Anxious, on Edge 1  ?Control/stop worrying 1  ?Worry too much - different things 1  ?Trouble relaxing 1  ?Restless 0  ?Easily annoyed or irritable 1  ?Afraid - awful might happen 0  ?Total GAD 7 Score 5  ?Anxiety Difficulty Not difficult at all  ? ? ? ? ?  ?Physical Exam ?Constitutional: She appears well-developed and well-nourished. No distress.  ?HENT:  ?Head: Normocephalic and atraumatic.  ?Right Ear: External ear normal. Normal ear canal and TM ?Left Ear: External ear normal.  Normal ear canal and TM ?Mouth/Throat: Oropharynx is clear and moist.  ?Eyes: Conjunctivae and EOM are normal.  ?Neck: Neck  supple. No tracheal deviation present. No thyromegaly present.  ?No carotid bruit  ?Cardiovascular: Normal rate, regular rhythm and normal heart sounds.   ?No murmur heard.  No edema. ?Pulmonary/Chest: Effort normal and breath sounds normal. No respiratory distress. She has no wheezes. She has no rales.  ?Breast: deferred   ?Abdominal: Soft. She exhibits no distension. There is no tenderness.  ?Lymphadenopathy: She has no cervical adenopathy.  ?Skin: Skin is warm and dry. She is not diaphoretic.  ?Psychiatric: She has a normal mood and affect. Her behavior is normal.  ? ? ? ?Lab Results  ?Component Value Date  ? WBC 4.2 06/09/2020  ? HGB 16.0 06/09/2020  ? HCT 46.8 06/09/2020  ? PLT 197.0 06/09/2020  ? GLUCOSE 86 06/09/2020  ? CHOL 183 06/09/2020  ? TRIG 109.0 06/09/2020  ? HDL 47.50 06/09/2020  ? LDLCALC 113 (H) 06/09/2020  ? ALT 24 06/09/2020  ? AST 22 06/09/2020  ? NA 140 06/09/2020  ? K 4.3 06/09/2020  ? CL 103 06/09/2020  ? CREATININE 1.15 06/09/2020  ? BUN 18 06/09/2020  ? CO2 31 06/09/2020  ? TSH 4.26 06/09/2020  ? PSA 1.47 06/09/2020  ? HGBA1C 5.5 06/09/2020  ? ? ?The 10-year ASCVD risk score (Arnett DK, et al., 2019) is: 12.1% ?  Values used to calculate the score: ?    Age: 66 years ?    Sex: Male ?    Is Non-Hispanic African American: No ?    Diabetic: No ?    Tobacco smoker: No ?    Systolic Blood Pressure: 741 mmHg ?    Is BP treated: Yes ?    HDL Cholesterol: 47.5 mg/dL ?    Total Cholesterol: 183 mg/dL ? ? ?Interested in further evaluation - Ct coronary artery calcium score ordered ? ?   ?Assessment & Plan:  ? ?Physical exam: ?Screening blood work  ordered ?Exercise   walking regularly ?Weight    normal ?Substance abuse  none ? ? ?Reviewed recommended immunizations. ? ? ?Health Maintenance  ?Topic Date Due  ? COVID-19 Vaccine (3 - Pfizer risk series) 06/30/2021 (Originally 06/08/2019)  ? COLONOSCOPY (Pts 45-81yr Insurance coverage will need to be confirmed)  06/09/2026  ? TETANUS/TDAP  06/29/2026  ?  Pneumonia Vaccine 68 Years old  Completed  ? INFLUENZA VACCINE  Completed  ? Hepatitis C Screening  Completed  ? Zoster Vaccines- Shingrix  Completed  ? HPV VACCINES  Aged  Out  ?  ? ? ? ? ? ? ?See Problem List for Assessment and Plan of chronic medical problems. ? ? ? ? ?

## 2021-06-14 ENCOUNTER — Ambulatory Visit (INDEPENDENT_AMBULATORY_CARE_PROVIDER_SITE_OTHER): Payer: Medicare PPO | Admitting: Internal Medicine

## 2021-06-14 ENCOUNTER — Other Ambulatory Visit: Payer: Self-pay

## 2021-06-14 VITALS — BP 106/78 | HR 70 | Temp 98.1°F | Wt 185.8 lb

## 2021-06-14 DIAGNOSIS — R7303 Prediabetes: Secondary | ICD-10-CM | POA: Diagnosis not present

## 2021-06-14 DIAGNOSIS — Z125 Encounter for screening for malignant neoplasm of prostate: Secondary | ICD-10-CM

## 2021-06-14 DIAGNOSIS — E7849 Other hyperlipidemia: Secondary | ICD-10-CM

## 2021-06-14 DIAGNOSIS — Z0000 Encounter for general adult medical examination without abnormal findings: Secondary | ICD-10-CM | POA: Diagnosis not present

## 2021-06-14 DIAGNOSIS — F419 Anxiety disorder, unspecified: Secondary | ICD-10-CM

## 2021-06-14 DIAGNOSIS — Z136 Encounter for screening for cardiovascular disorders: Secondary | ICD-10-CM | POA: Diagnosis not present

## 2021-06-14 DIAGNOSIS — I10 Essential (primary) hypertension: Secondary | ICD-10-CM | POA: Diagnosis not present

## 2021-06-14 LAB — TSH: TSH: 5.53 u[IU]/mL — ABNORMAL HIGH (ref 0.35–5.50)

## 2021-06-14 LAB — CBC WITH DIFFERENTIAL/PLATELET
Basophils Absolute: 0.1 10*3/uL (ref 0.0–0.1)
Basophils Relative: 1.2 % (ref 0.0–3.0)
Eosinophils Absolute: 0.1 10*3/uL (ref 0.0–0.7)
Eosinophils Relative: 2.4 % (ref 0.0–5.0)
HCT: 47.1 % (ref 39.0–52.0)
Hemoglobin: 16.1 g/dL (ref 13.0–17.0)
Lymphocytes Relative: 35.4 % (ref 12.0–46.0)
Lymphs Abs: 1.6 10*3/uL (ref 0.7–4.0)
MCHC: 34.1 g/dL (ref 30.0–36.0)
MCV: 91.1 fl (ref 78.0–100.0)
Monocytes Absolute: 0.5 10*3/uL (ref 0.1–1.0)
Monocytes Relative: 10.1 % (ref 3.0–12.0)
Neutro Abs: 2.3 10*3/uL (ref 1.4–7.7)
Neutrophils Relative %: 50.9 % (ref 43.0–77.0)
Platelets: 200 10*3/uL (ref 150.0–400.0)
RBC: 5.17 Mil/uL (ref 4.22–5.81)
RDW: 13.3 % (ref 11.5–15.5)
WBC: 4.5 10*3/uL (ref 4.0–10.5)

## 2021-06-14 LAB — LIPID PANEL
Cholesterol: 194 mg/dL (ref 0–200)
HDL: 47.6 mg/dL (ref >39.00)
LDL Cholesterol: 118 mg/dL — ABNORMAL HIGH (ref 0–99)
NonHDL: 145.96
Total CHOL/HDL Ratio: 4
Triglycerides: 138 mg/dL (ref 0.0–149.0)
VLDL: 27.6 mg/dL (ref 0.0–40.0)

## 2021-06-14 LAB — COMPREHENSIVE METABOLIC PANEL
ALT: 38 U/L (ref 0–53)
AST: 31 U/L (ref 0–37)
Albumin: 4.7 g/dL (ref 3.5–5.2)
Alkaline Phosphatase: 74 U/L (ref 39–117)
BUN: 17 mg/dL (ref 6–23)
CO2: 30 mEq/L (ref 19–32)
Calcium: 9.6 mg/dL (ref 8.4–10.5)
Chloride: 104 mEq/L (ref 96–112)
Creatinine, Ser: 1.23 mg/dL (ref 0.40–1.50)
GFR: 60.59 mL/min (ref >60.00)
Glucose, Bld: 87 mg/dL (ref 70–99)
Potassium: 4.7 mEq/L (ref 3.5–5.1)
Sodium: 142 mEq/L (ref 135–145)
Total Bilirubin: 0.8 mg/dL (ref 0.2–1.2)
Total Protein: 7 g/dL (ref 6.0–8.3)

## 2021-06-14 LAB — PSA, MEDICARE: PSA: 1.42 ng/ml (ref 0.10–4.00)

## 2021-06-14 LAB — HEMOGLOBIN A1C: Hgb A1c MFr Bld: 5.6 % (ref 4.6–6.5)

## 2021-06-14 MED ORDER — VALSARTAN 80 MG PO TABS: 80.0000 mg | ORAL_TABLET | Freq: Every day | ORAL | 3 refills | Status: DC

## 2021-06-14 MED ORDER — FLUOXETINE HCL 10 MG PO TABS: 10.0000 mg | ORAL_TABLET | Freq: Every day | ORAL | 3 refills | Status: DC

## 2021-06-14 NOTE — Assessment & Plan Note (Signed)
Chronic Check a1c Low sugar / carb diet Stressed regular exercise  

## 2021-06-14 NOTE — Assessment & Plan Note (Signed)
Chronic ?Regular exercise and healthy diet encouraged ?Check lipid panel  ?Continue fish oil ?

## 2021-06-14 NOTE — Assessment & Plan Note (Signed)
Chronic ?Blood pressure well controlled ?CMP ?Continue valsartan 80 mg daily ?

## 2021-06-14 NOTE — Assessment & Plan Note (Signed)
Chronic Controlled, Stable Continue fluoxetine 10 mg daily 

## 2021-06-27 ENCOUNTER — Encounter: Payer: Self-pay | Admitting: Internal Medicine

## 2021-07-26 ENCOUNTER — Ambulatory Visit (INDEPENDENT_AMBULATORY_CARE_PROVIDER_SITE_OTHER)
Admission: RE | Admit: 2021-07-26 | Discharge: 2021-07-26 | Disposition: A | Discharge status: Home / Self Care | Payer: Self-pay | Source: Ambulatory Visit | Attending: Internal Medicine | Admitting: Internal Medicine

## 2021-07-26 DIAGNOSIS — Z136 Encounter for screening for cardiovascular disorders: Secondary | ICD-10-CM

## 2021-07-27 ENCOUNTER — Encounter: Payer: Self-pay | Admitting: Internal Medicine

## 2021-07-27 DIAGNOSIS — R931 Abnormal findings on diagnostic imaging of heart and coronary circulation: Secondary | ICD-10-CM | POA: Insufficient documentation

## 2021-11-29 DIAGNOSIS — H5231 Anisometropia: Secondary | ICD-10-CM | POA: Diagnosis not present

## 2021-11-29 DIAGNOSIS — H524 Presbyopia: Secondary | ICD-10-CM | POA: Diagnosis not present

## 2021-11-29 DIAGNOSIS — H25813 Combined forms of age-related cataract, bilateral: Secondary | ICD-10-CM | POA: Diagnosis not present

## 2021-12-09 LAB — HM DIABETES EYE EXAM

## 2021-12-19 ENCOUNTER — Encounter: Payer: Self-pay | Admitting: Internal Medicine

## 2021-12-19 NOTE — Progress Notes (Signed)
Outside notes received. Information abstracted. Notes sent to scan.  

## 2022-01-13 ENCOUNTER — Encounter: Payer: Self-pay | Admitting: Internal Medicine

## 2022-03-28 DIAGNOSIS — L820 Inflamed seborrheic keratosis: Secondary | ICD-10-CM | POA: Diagnosis not present

## 2022-03-28 DIAGNOSIS — D485 Neoplasm of uncertain behavior of skin: Secondary | ICD-10-CM | POA: Diagnosis not present

## 2022-03-28 DIAGNOSIS — D1801 Hemangioma of skin and subcutaneous tissue: Secondary | ICD-10-CM | POA: Diagnosis not present

## 2022-06-04 DIAGNOSIS — L814 Other melanin hyperpigmentation: Secondary | ICD-10-CM | POA: Diagnosis not present

## 2022-06-04 DIAGNOSIS — L821 Other seborrheic keratosis: Secondary | ICD-10-CM | POA: Diagnosis not present

## 2022-06-04 DIAGNOSIS — L578 Other skin changes due to chronic exposure to nonionizing radiation: Secondary | ICD-10-CM | POA: Diagnosis not present

## 2022-06-04 DIAGNOSIS — L820 Inflamed seborrheic keratosis: Secondary | ICD-10-CM | POA: Diagnosis not present

## 2022-06-17 ENCOUNTER — Encounter: Payer: Self-pay | Admitting: Internal Medicine

## 2022-06-17 NOTE — Patient Instructions (Addendum)
Blood work was ordered.   The lab is on the first floor.    Medications changes include :   none     Return in about 1 year (around 06/18/2023) for Physical Exam.   Health Maintenance, Male Adopting a healthy lifestyle and getting preventive care are important in promoting health and wellness. Ask your health care provider about: The right schedule for you to have regular tests and exams. Things you can do on your own to prevent diseases and keep yourself healthy. What should I know about diet, weight, and exercise? Eat a healthy diet  Eat a diet that includes plenty of vegetables, fruits, low-fat dairy products, and lean protein. Do not eat a lot of foods that are high in solid fats, added sugars, or sodium. Maintain a healthy weight Body mass index (BMI) is a measurement that can be used to identify possible weight problems. It estimates body fat based on height and weight. Your health care provider can help determine your BMI and help you achieve or maintain a healthy weight. Get regular exercise Get regular exercise. This is one of the most important things you can do for your health. Most adults should: Exercise for at least 150 minutes each week. The exercise should increase your heart rate and make you sweat (moderate-intensity exercise). Do strengthening exercises at least twice a week. This is in addition to the moderate-intensity exercise. Spend less time sitting. Even light physical activity can be beneficial. Watch cholesterol and blood lipids Have your blood tested for lipids and cholesterol at 69 years of age, then have this test every 5 years. You may need to have your cholesterol levels checked more often if: Your lipid or cholesterol levels are high. You are older than 69 years of age. You are at high risk for heart disease. What should I know about cancer screening? Many types of cancers can be detected early and may often be prevented. Depending on your  health history and family history, you may need to have cancer screening at various ages. This may include screening for: Colorectal cancer. Prostate cancer. Skin cancer. Lung cancer. What should I know about heart disease, diabetes, and high blood pressure? Blood pressure and heart disease High blood pressure causes heart disease and increases the risk of stroke. This is more likely to develop in people who have high blood pressure readings or are overweight. Talk with your health care provider about your target blood pressure readings. Have your blood pressure checked: Every 3-5 years if you are 69-64 years of age. Every year if you are 26 years old or older. If you are between the ages of 12 and 45 and are a current or former smoker, ask your health care provider if you should have a one-time screening for abdominal aortic aneurysm (AAA). Diabetes Have regular diabetes screenings. This checks your fasting blood sugar level. Have the screening done: Once every three years after age 56 if you are at a normal weight and have a low risk for diabetes. More often and at a younger age if you are overweight or have a high risk for diabetes. What should I know about preventing infection? Hepatitis B If you have a higher risk for hepatitis B, you should be screened for this virus. Talk with your health care provider to find out if you are at risk for hepatitis B infection. Hepatitis C Blood testing is recommended for: Everyone born from [birth year] through 1967. Anyone with known risk  factors for hepatitis C. Sexually transmitted infections (STIs) You should be screened each year for STIs, including gonorrhea and chlamydia, if: You are sexually active and are younger than 69 years of age. You are older than 69 years of age and your health care provider tells you that you are at risk for this type of infection. Your sexual activity has changed since you were last screened, and you are at increased risk  for chlamydia or gonorrhea. Ask your health care provider if you are at risk. Ask your health care provider about whether you are at high risk for HIV. Your health care provider may recommend a prescription medicine to help prevent HIV infection. If you choose to take medicine to prevent HIV, you should first get tested for HIV. You should then be tested every 3 months for as long as you are taking the medicine. Follow these instructions at home: Alcohol use Do not drink alcohol if your health care provider tells you not to drink. If you drink alcohol: Limit how much you have to 0-2 drinks a day. Know how much alcohol is in your drink. In the [place] one drink equals one 12 oz bottle of beer (355 mL), one 5 oz glass of wine (148 mL), or one 1 oz glass of hard liquor (44 mL). Lifestyle Do not use any products that contain nicotine or tobacco. These products include cigarettes, chewing tobacco, and vaping devices, such as e-cigarettes. If you need help quitting, ask your health care provider. Do not use street drugs. Do not share needles. Ask your health care provider for help if you need support or information about quitting drugs. General instructions Schedule regular health, dental, and eye exams. Stay current with your vaccines. Tell your health care provider if: You often feel depressed. You have ever been abused or do not feel safe at home. Summary Adopting a healthy lifestyle and getting preventive care are important in promoting health and wellness. Follow your health care provider's instructions about healthy diet, exercising, and getting tested or screened for diseases. Follow your health care provider's instructions on monitoring your cholesterol and blood pressure. This information is not intended to replace advice given to you by your health care provider. Make sure you discuss any questions you have with your health care provider. Document Revised: 07/31/2020 Document Reviewed:  07/31/2020 Elsevier Patient Education  [place].

## 2022-06-18 ENCOUNTER — Ambulatory Visit (INDEPENDENT_AMBULATORY_CARE_PROVIDER_SITE_OTHER): Payer: Medicare PPO | Admitting: Internal Medicine

## 2022-06-18 VITALS — BP 106/72 | HR 97 | Temp 98.2°F | Ht 72.64 in | Wt 182.0 lb

## 2022-06-18 DIAGNOSIS — Z0000 Encounter for general adult medical examination without abnormal findings: Secondary | ICD-10-CM

## 2022-06-18 DIAGNOSIS — R7303 Prediabetes: Secondary | ICD-10-CM

## 2022-06-18 DIAGNOSIS — Z125 Encounter for screening for malignant neoplasm of prostate: Secondary | ICD-10-CM

## 2022-06-18 DIAGNOSIS — G4733 Obstructive sleep apnea (adult) (pediatric): Secondary | ICD-10-CM | POA: Diagnosis not present

## 2022-06-18 DIAGNOSIS — F419 Anxiety disorder, unspecified: Secondary | ICD-10-CM

## 2022-06-18 DIAGNOSIS — E7849 Other hyperlipidemia: Secondary | ICD-10-CM | POA: Diagnosis not present

## 2022-06-18 DIAGNOSIS — I10 Essential (primary) hypertension: Secondary | ICD-10-CM | POA: Diagnosis not present

## 2022-06-18 LAB — PSA, MEDICARE: PSA: 1.85 ng/ml (ref 0.10–4.00)

## 2022-06-18 LAB — CBC WITH DIFFERENTIAL/PLATELET
Basophils Absolute: 0.1 10*3/uL (ref 0.0–0.1)
Basophils Relative: 1.2 % (ref 0.0–3.0)
Eosinophils Absolute: 0.1 10*3/uL (ref 0.0–0.7)
Eosinophils Relative: 1.7 % (ref 0.0–5.0)
HCT: 47.2 % (ref 39.0–52.0)
Hemoglobin: 16.2 g/dL (ref 13.0–17.0)
Lymphocytes Relative: 31.8 % (ref 12.0–46.0)
Lymphs Abs: 1.4 10*3/uL (ref 0.7–4.0)
MCHC: 34.2 g/dL (ref 30.0–36.0)
MCV: 90 fl (ref 78.0–100.0)
Monocytes Absolute: 0.5 10*3/uL (ref 0.1–1.0)
Monocytes Relative: 11.1 % (ref 3.0–12.0)
Neutro Abs: 2.3 10*3/uL (ref 1.4–7.7)
Neutrophils Relative %: 54.2 % (ref 43.0–77.0)
Platelets: 222 10*3/uL (ref 150.0–400.0)
RBC: 5.24 Mil/uL (ref 4.22–5.81)
RDW: 13.2 % (ref 11.5–15.5)
WBC: 4.3 10*3/uL (ref 4.0–10.5)

## 2022-06-18 LAB — LIPID PANEL
Cholesterol: 187 mg/dL (ref 0–200)
HDL: 51.1 mg/dL (ref >39.00)
LDL Cholesterol: 118 mg/dL — ABNORMAL HIGH (ref 0–99)
NonHDL: 136.15
Total CHOL/HDL Ratio: 4
Triglycerides: 91 mg/dL (ref 0.0–149.0)
VLDL: 18.2 mg/dL (ref 0.0–40.0)

## 2022-06-18 LAB — COMPREHENSIVE METABOLIC PANEL
ALT: 27 U/L (ref 0–53)
AST: 22 U/L (ref 0–37)
Albumin: 4.5 g/dL (ref 3.5–5.2)
Alkaline Phosphatase: 76 U/L (ref 39–117)
BUN: 15 mg/dL (ref 6–23)
CO2: 29 mEq/L (ref 19–32)
Calcium: 9.5 mg/dL (ref 8.4–10.5)
Chloride: 103 mEq/L (ref 96–112)
Creatinine, Ser: 1.17 mg/dL (ref 0.40–1.50)
GFR: 63.88 mL/min (ref >60.00)
Glucose, Bld: 87 mg/dL (ref 70–99)
Potassium: 4.2 mEq/L (ref 3.5–5.1)
Sodium: 141 mEq/L (ref 135–145)
Total Bilirubin: 0.7 mg/dL (ref 0.2–1.2)
Total Protein: 6.9 g/dL (ref 6.0–8.3)

## 2022-06-18 LAB — HEMOGLOBIN A1C: Hgb A1c MFr Bld: 5.7 % (ref 4.6–6.5)

## 2022-06-18 LAB — TSH: TSH: 4.3 u[IU]/mL (ref 0.35–5.50)

## 2022-06-18 MED ORDER — FLUOXETINE HCL 20 MG PO TABS: 20.0000 mg | ORAL_TABLET | Freq: Every day | ORAL | 3 refills | Status: DC

## 2022-06-18 MED ORDER — VALSARTAN 80 MG PO TABS: 80.0000 mg | ORAL_TABLET | Freq: Every day | ORAL | 3 refills | Status: DC

## 2022-06-18 NOTE — Assessment & Plan Note (Signed)
Chronic Uses cpap nightly -typically about 5 hrs Will refer to pulm for follow up

## 2022-06-18 NOTE — Progress Notes (Signed)
Subjective:    Patient ID: Steven Wolf, male    DOB: December 13, 1955, 67 y.o.   MRN: [id number]     HPI Steven Wolf is here for a physical exam and his chronic medical problems.   Has had some increased increased anxiety recently.  He did start taking 15 mg daily and that has helped.    Medications and allergies reviewed with patient and updated if appropriate.  Current Outpatient Medications on File Prior to Visit  Medication Sig Dispense Refill   aspirin 81 MG tablet Take 81 mg by mouth daily.     Cholecalciferol (VITAMIN D) 2000 UNITS CAPS Take 2,000 Units by mouth daily.     Coenzyme Q10 (COQ10) 100 MG CAPS Take 1 tablet by mouth daily.     fish oil-omega-3 fatty acids 1000 MG capsule Take 1 g by mouth 2 (two) times daily.     FLUoxetine (PROZAC) 10 MG tablet Take 1 tablet (10 mg total) by mouth daily. 90 tablet 3   OMEGA-3 KRILL OIL PO Take 1 capsule by mouth daily.     valsartan (DIOVAN) 80 MG tablet Take 1 tablet (80 mg total) by mouth daily. 90 tablet 3   No current facility-administered medications on file prior to visit.    Review of Systems  Constitutional:  Negative for fever.  Eyes:  Negative for visual disturbance.  Respiratory:  Negative for cough, shortness of breath and wheezing.   Cardiovascular:  Negative for chest pain, palpitations and leg swelling.  Gastrointestinal:  Positive for anal bleeding (occ, hemmorhoidal). Negative for abdominal pain, blood in stool, constipation and diarrhea.       No gerd  Genitourinary:  Negative for difficulty urinating and dysuria.       Weak stream  Musculoskeletal:  Negative for arthralgias and back pain.  Skin:  Negative for rash.  Neurological:  Negative for light-headedness and headaches.  Psychiatric/Behavioral:  Negative for dysphoric mood. The patient is nervous/anxious.        Objective:   Vitals:   06/18/22 0820  BP: 106/72  Pulse: 97  Temp: 98.2 F (36.8 C)  SpO2: 96%   Filed Weights   06/18/22  0820  Weight: 182 lb (82.6 kg)   Body mass index is 24.25 kg/m.  BP Readings from Last 3 Encounters:  06/18/22 106/72  06/14/21 106/78  06/09/20 108/82    Wt Readings from Last 3 Encounters:  06/18/22 182 lb (82.6 kg)  06/14/21 185 lb 12.8 oz (84.3 kg)  06/09/20 178 lb (80.7 kg)      Physical Exam Constitutional: He appears well-developed and well-nourished. No distress.  HENT:  Head: Normocephalic and atraumatic.  Right Ear: External ear normal.  Left Ear: External ear normal.  Normal ear canals and TM b/l  Mouth/Throat: Oropharynx is clear and moist. Eyes: Conjunctivae and EOM are normal.  Neck: Neck supple. No tracheal deviation present. No thyromegaly present.  No carotid bruit  Cardiovascular: Normal rate, regular rhythm, normal heart sounds and intact distal pulses.   No murmur heard.  No lower extremity edema. Pulmonary/Chest: Effort normal and breath sounds normal. No respiratory distress. He has no wheezes. He has no rales.  Abdominal: Soft. He exhibits no distension. There is no tenderness.  Genitourinary: deferred  Lymphadenopathy:   He has no cervical adenopathy.  Skin: Skin is warm and dry. He is not diaphoretic.  Psychiatric: He has a normal mood and affect. His behavior is normal.  Assessment & Plan:   Physical exam: Screening blood work  ordered Exercise   regular Weight is good Substance abuse   none   Reviewed recommended immunizations.   Health Maintenance  Topic Date Due   Medicare Annual Wellness (AWV)  Never done   COVID-19 Vaccine (3 - Pfizer risk series) 06/08/2019   COLONOSCOPY (Pts 45-70yrs Insurance coverage will need to be confirmed)  06/09/2026   DTaP/Tdap/Td (3 - Td or Tdap) 06/29/2026   Pneumonia Vaccine 51+ Years old  Completed   INFLUENZA VACCINE  Completed   Hepatitis C Screening  Completed   Zoster Vaccines- Shingrix  Completed   HPV VACCINES  Aged Out     See Problem List for Assessment and Plan of chronic  medical problems.

## 2022-06-18 NOTE — Assessment & Plan Note (Addendum)
Chronic Regular exercise and healthy diet encouraged Check lipid panel, CMP, TSH Continue fish oil

## 2022-06-18 NOTE — Assessment & Plan Note (Signed)
Chronic Check a1c Low sugar / carb diet Stressed regular exercise  

## 2022-06-18 NOTE — Assessment & Plan Note (Signed)
Chronic Controlled, Stable Continue fluoxetine 10 mg daily 

## 2022-06-18 NOTE — Assessment & Plan Note (Addendum)
Chronic Blood pressure well controlled CMP, CBC Continue valsartan 80 mg daily

## 2022-06-26 ENCOUNTER — Other Ambulatory Visit: Payer: Self-pay | Admitting: Internal Medicine

## 2022-07-04 ENCOUNTER — Encounter (HOSPITAL_BASED_OUTPATIENT_CLINIC_OR_DEPARTMENT_OTHER): Payer: Self-pay | Admitting: Pulmonary Disease

## 2022-07-04 ENCOUNTER — Ambulatory Visit (INDEPENDENT_AMBULATORY_CARE_PROVIDER_SITE_OTHER): Payer: Medicare PPO | Admitting: Pulmonary Disease

## 2022-07-04 VITALS — BP 104/66 | HR 86 | Temp 97.7°F | Ht 72.0 in | Wt 188.4 lb

## 2022-07-04 DIAGNOSIS — G4733 Obstructive sleep apnea (adult) (pediatric): Secondary | ICD-10-CM

## 2022-07-04 NOTE — Patient Instructions (Addendum)
  X CPAP download  X increase humidity Trial of airfit F30 small full face mask to adapt DME  X we can send rx for new CPAP 9 cm

## 2022-07-04 NOTE — Assessment & Plan Note (Signed)
He has tolerated CPAP well overall for the past several years.  His main complaint is dryness.  He has not appreciated remarkable improvement in his energy levels but does appreciate the cardiovascular benefit as is willing to continue. CPAP download was reviewed which shows excellent control of events on set pressure of 9 cm with minimal leak and good compliance about 5.5 hours on average. We offered him repeat sleep testing but we discussed that this is not necessary at this time.  He is willing to continue on the CPAP.  Will provide him with a new machine and started at 9 cm.  For his mild dryness, I recommended AirFit F30 fullface mask and he can also try to increase the humidity on his machine   compliance with goal of at least 4-6 hrs every night is the expectation. Advised against medications with sedative side effects Cautioned against driving when sleepy - understanding that sleepiness will vary on a day to day basis

## 2022-07-04 NOTE — Progress Notes (Signed)
Subjective:    Patient ID: Steven Wolf, male    DOB: [birth year], 67 y.o.   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 He was evaluated in 2017 with a sleep study that surprisingly showed moderate OSA with AHI 26 and low sat of 81%.  He was placed on CPAP of 9 cm with a nasal mask.  Is on a follow-up titration study but was lost to follow-up since.  He has been maintained on the settings by D[facility].  He really did not have much success with them and has been getting his own supplies online.  He was unable to tolerate chinstrap.  His main complaint is dryness of mouth. He did not have a remarkable improvement in energy levels but denies sleep pressure in the daytime. His wife states that when he had COVID infection he did not use CPAP machine last year and she did not hear about snoring. Epworth sleepiness score is 5. Bedtime is around 11 PM, sleep latency is 30 minutes, reports 2-3 nocturnal awakenings and is out of bed by 8:30 AM feeling rested with dryness of but denies headaches. There is no history suggestive of cataplexy, sleep paralysis or parasomnias   Significant tests/ events reviewed  HST 03/2015 AHI: 26.0/hr ,Lowest Sat: 81% CPAP titration 04/2015 9 cm, nasal mask  Past Medical History:  Diagnosis Date   Anxiety    Benign prostatic hypertrophy    Diverticulosis of colon    Gilbert syndrome    Hypertension    Microscopic hematuria    Nephrolithiasis     X 2; Dr Aldean Ast   Sleep apnea    USES CPAP   Syncope 02/2007   NOCTURNAL POST MICTURTION   Urticaria     Past Surgical History:  Procedure Laterality Date   APPENDECTOMY     COLONOSCOPY  2010   NORMAL(pt states 2 other colonoscopies in the past)   COLONOSCOPY W/ POLYPECTOMY  2002   Tics 2005 & 2010; due 2020, Dr Jarold Motto   LIVER BIOPSY     X 1 for elevated LFTs with NEGATIVE  hepatitis serologies   TONSILLECTOMY     TONSILLECTOMY     PT STATES HAS HAD IT  TWICE   UPPER GASTROINTESTINAL ENDOSCOPY  1995    Allergies  Allergen Reactions   Ramipril     REACTION: elevated liver enzymes   Penicillins     ? Reaction (as child)    Social History   Socioeconomic History   Marital status: Married    Spouse name: Not on file   Number of children: 0   Years of education: Not on file   Highest education level: Not on file  Occupational History   Not on file  Tobacco Use   Smoking status: Never   Smokeless tobacco: Never   Tobacco comments:    Second hand smoke  Vaping Use   Vaping Use: Never used  Substance and Sexual Activity   Alcohol use: No   Drug use: No   Sexual activity: Not on file  Other Topics Concern   Not on file  Social History Narrative   Right Handed   Lives in a two story home    Drinks some caffeine    Social Determinants of Health   Financial Resource Strain: Not on file  Food Insecurity: Not on file  Transportation Needs: Not on file  Physical Activity: Not on file  Stress: Not on file  Social Connections: Not on file  Intimate Partner Violence: Not on file    Family History  Problem Relation Age of Onset   Breast cancer Mother    Rheumatologic disease Mother        RA   Heart disease Father        PCV;MI ? 21   Polycythemia Father    Leukemia Paternal Uncle    Polycythemia Paternal Uncle    Heart attack Paternal Grandfather        > 28   Stroke Maternal Grandmother        . 65   Sleep apnea Sister    Diabetes Neg Hx    Colon cancer Neg Hx    Colon polyps Neg Hx    Esophageal cancer Neg Hx    Stomach cancer Neg Hx    Rectal cancer Neg Hx      Review of Systems Nasal congestion Sneezing Anxiety  Constitutional: negative for anorexia, fevers and sweats  Eyes: negative for irritation, redness and visual disturbance  Ears, nose, mouth, throat, and face: negative for earaches, epistaxis and sore throat  Respiratory: negative for cough, dyspnea on exertion, sputum and wheezing   Cardiovascular: negative for chest pain, dyspnea, lower extremity edema, orthopnea, palpitations and syncope  Gastrointestinal: negative for abdominal pain, constipation, diarrhea, melena, nausea and vomiting  Genitourinary:negative for dysuria, frequency and hematuria  Hematologic/lymphatic: negative for bleeding, easy bruising and lymphadenopathy  Musculoskeletal:negative for arthralgias, muscle weakness and stiff joints  Neurological: negative for coordination problems, gait problems, headaches and weakness  Endocrine: negative for diabetic symptoms including polydipsia, polyuria and weight loss     Objective:   Physical Exam  Gen. Pleasant, well-nourished, in no distress, normal affect ENT - no pallor,icterus, no post nasal drip, 2 mm overbite Neck: No JVD, no thyromegaly, no carotid bruits Lungs: no use of accessory muscles, no dullness to percussion, clear without rales or rhonchi  Cardiovascular: Rhythm regular, heart sounds  normal, no murmurs or gallops, no peripheral edema Abdomen: soft and non-tender, no hepatosplenomegaly, BS normal. Musculoskeletal: No deformities, no cyanosis or clubbing Neuro:  alert, non focal       Assessment & Plan:

## 2022-07-15 DIAGNOSIS — G4733 Obstructive sleep apnea (adult) (pediatric): Secondary | ICD-10-CM | POA: Diagnosis not present

## 2022-07-22 DIAGNOSIS — R3912 Poor urinary stream: Secondary | ICD-10-CM | POA: Diagnosis not present

## 2022-07-22 DIAGNOSIS — N401 Enlarged prostate with lower urinary tract symptoms: Secondary | ICD-10-CM | POA: Diagnosis not present

## 2022-07-22 DIAGNOSIS — R3121 Asymptomatic microscopic hematuria: Secondary | ICD-10-CM | POA: Diagnosis not present

## 2022-08-13 ENCOUNTER — Ambulatory Visit (INDEPENDENT_AMBULATORY_CARE_PROVIDER_SITE_OTHER): Payer: Medicare PPO | Admitting: Internal Medicine

## 2022-08-13 ENCOUNTER — Encounter: Payer: Self-pay | Admitting: Internal Medicine

## 2022-08-13 VITALS — BP 104/72 | HR 70 | Temp 98.0°F | Ht 72.0 in | Wt 184.0 lb

## 2022-08-13 DIAGNOSIS — K219 Gastro-esophageal reflux disease without esophagitis: Secondary | ICD-10-CM | POA: Diagnosis not present

## 2022-08-13 MED ORDER — OMEPRAZOLE 20 MG PO CPDR: 20.0000 mg | DELAYED_RELEASE_CAPSULE | Freq: Every day | ORAL | 3 refills | Status: DC

## 2022-08-13 NOTE — Patient Instructions (Addendum)
Medications changes include :   omeprazole 20 mg daily 30 minutes prior to a meal for 2 weeks and then take as needed     Return if symptoms worsen or fail to improve.       Gastroesophageal Reflux Disease, Adult Gastroesophageal reflux (GER) happens when acid from the stomach flows up into the tube that connects the mouth and the stomach (esophagus). Normally, food travels down the esophagus and stays in the stomach to be digested. However, when a person has GER, food and stomach acid sometimes move back up into the esophagus. If this becomes a more serious problem, the person may be diagnosed with a disease called gastroesophageal reflux disease (GERD). GERD occurs when the reflux: Happens often. Causes frequent or severe symptoms. Causes problems such as damage to the esophagus. When stomach acid comes in contact with the esophagus, the acid may cause inflammation in the esophagus. Over time, GERD may create small holes (ulcers) in the lining of the esophagus. What are the causes? This condition is caused by a problem with the muscle between the esophagus and the stomach (lower esophageal sphincter, or LES). Normally, the LES muscle closes after food passes through the esophagus to the stomach. When the LES is weakened or abnormal, it does not close properly, and that allows food and stomach acid to go back up into the esophagus. The LES can be weakened by certain dietary substances, medicines, and medical conditions, including: Tobacco use. Pregnancy. Having a hiatal hernia. Alcohol use. Certain foods and beverages, such as coffee, chocolate, onions, and peppermint. What increases the risk? You are more likely to develop this condition if you: Have an increased body weight. Have a connective tissue disorder. Take NSAIDs, such as ibuprofen. What are the signs or symptoms? Symptoms of this condition include: Heartburn. Difficult or painful swallowing and the feeling of  having a lump in the throat. A bitter taste in the mouth. Bad breath and having a large amount of saliva. Having an upset or bloated stomach and belching. Chest pain. Different conditions can cause chest pain. Make sure you see your health care provider if you experience chest pain. Shortness of breath or wheezing. Ongoing (chronic) cough or a nighttime cough. Wearing away of tooth enamel. Weight loss. How is this diagnosed? This condition may be diagnosed based on a medical history and a physical exam. To determine if you have mild or severe GERD, your health care provider may also monitor how you respond to treatment. You may also have tests, including: A test to examine your stomach and esophagus with a small camera (endoscopy). A test that measures the acidity level in your esophagus. A test that measures how much pressure is on your esophagus. A barium swallow or modified barium swallow test to show the shape, size, and functioning of your esophagus. How is this treated? Treatment for this condition may vary depending on how severe your symptoms are. Your health care provider may recommend: Changes to your diet. Medicine. Surgery. The goal of treatment is to help relieve your symptoms and to prevent complications. Follow these instructions at home: Eating and drinking  Follow a diet as recommended by your health care provider. This may involve avoiding foods and drinks such as: Coffee and tea, with or without caffeine. Drinks that contain alcohol. Energy drinks and sports drinks. Carbonated drinks or sodas. Chocolate and cocoa. Peppermint and mint flavorings. Garlic and onions. Horseradish. Spicy and acidic foods, including peppers, chili powder, curry powder, vinegar,  hot sauces, and barbecue sauce. Citrus fruit juices and citrus fruits, such as oranges, lemons, and limes. Tomato-based foods, such as red sauce, chili, salsa, and pizza with red sauce. Fried and fatty foods,  such as donuts, french fries, potato chips, and high-fat dressings. High-fat meats, such as hot dogs and fatty cuts of red and white meats, such as rib eye steak, sausage, ham, and bacon. High-fat dairy items, such as whole milk, butter, and cream cheese. Eat small, frequent meals instead of large meals. Avoid drinking large amounts of liquid with your meals. Avoid eating meals during the 2-3 hours before bedtime. Avoid lying down right after you eat. Do not exercise right after you eat. Lifestyle  Do not use any products that contain nicotine or tobacco. These products include cigarettes, chewing tobacco, and vaping devices, such as e-cigarettes. If you need help quitting, ask your health care provider. Try to reduce your stress by using methods such as yoga or meditation. If you need help reducing stress, ask your health care provider. If you are overweight, reduce your weight to an amount that is healthy for you. Ask your health care provider for guidance about a safe weight loss goal. General instructions Pay attention to any changes in your symptoms. Take over-the-counter and prescription medicines only as told by your health care provider. Do not take aspirin, ibuprofen, or other NSAIDs unless your health care provider told you to take these medicines. Wear loose-fitting clothing. Do not wear anything tight around your waist that causes pressure on your abdomen. Raise (elevate) the head of your bed about 6 inches (15 cm). You can use a wedge to do this. Avoid bending over if this makes your symptoms worse. Keep all follow-up visits. This is important. Contact a health care provider if: You have: New symptoms. Unexplained weight loss. Difficulty swallowing or it hurts to swallow. Wheezing or a persistent cough. A hoarse voice. Your symptoms do not improve with treatment. Get help right away if: You have sudden pain in your arms, neck, jaw, teeth, or back. You suddenly feel sweaty,  dizzy, or light-headed. You have chest pain or shortness of breath. You vomit and the vomit is green, yellow, or black, or it looks like blood or coffee grounds. You faint. You have stool that is red, bloody, or black. You cannot swallow, drink, or eat. These symptoms may represent a serious problem that is an emergency. Do not wait to see if the symptoms will go away. Get medical help right away. Call your local emergency services (911 in the [place] Do not drive yourself to the hospital. Summary Gastroesophageal reflux happens when acid from the stomach flows up into the esophagus. GERD is a disease in which the reflux happens often, causes frequent or severe symptoms, or causes problems such as damage to the esophagus. Treatment for this condition may vary depending on how severe your symptoms are. Your health care provider may recommend diet and lifestyle changes, medicine, or surgery. Contact a health care provider if you have new or worsening symptoms. Take over-the-counter and prescription medicines only as told by your health care provider. Do not take aspirin, ibuprofen, or other NSAIDs unless your health care provider told you to do so. Keep all follow-up visits as told by your health care provider. This is important. This information is not intended to replace advice given to you by your health care provider. Make sure you discuss any questions you have with your health care provider. Document Revised: 09/20/2019 Document  Reviewed: 09/20/2019 Elsevier Patient Education  2023 [employer].

## 2022-08-13 NOTE — Assessment & Plan Note (Addendum)
New Symptoms suggestive of reflux Stop pepto bismol Start omeprazole 20 mg daily for 2 weeks 30 minutes prior to meal then as needed Continue to hold carbonated beverages GERD diet He will let me know if his symptoms do not improve/resolve so we can evaluate further if necessary

## 2022-08-13 NOTE — Progress Notes (Signed)
Subjective:    Patient ID: Steven Wolf, male    DOB: [birth year], 67 y.o.   MRN: [id number]      HPI Steven Wolf is here for  Chief Complaint  Patient presents with   Bloated     Few weeks of digestive issues - increased gas, bloating, fluatulence.  Sometimes has a little GERD, knot in his stomach.  He states that heartburn is frequent.  He notes increased burping   Worse after larger meal - dinner. Best first thing in the morning.    New cpap - ? Swallowing more air. One carbonated beverage a day - not the last few days.   Started pepto bismol at night a few nights recently - ? Helped.     Tick bite 6 weeks ago - removed tick.    Medications and allergies reviewed with patient and updated if appropriate.  Current Outpatient Medications on File Prior to Visit  Medication Sig Dispense Refill   aspirin 81 MG tablet Take 81 mg by mouth daily.     Cholecalciferol (VITAMIN D) 2000 UNITS CAPS Take 2,000 Units by mouth daily.     Coenzyme Q10 (COQ10) 100 MG CAPS Take 1 tablet by mouth daily.     fish oil-omega-3 fatty acids 1000 MG capsule Take 1 g by mouth 2 (two) times daily.     FLUoxetine (PROZAC) 20 MG tablet Take 1 tablet (20 mg total) by mouth daily. 90 tablet 3   OMEGA-3 KRILL OIL PO Take 1 capsule by mouth daily.     valsartan (DIOVAN) 80 MG tablet Take 1 tablet (80 mg total) by mouth daily. 90 tablet 3   No current facility-administered medications on file prior to visit.    Review of Systems  Constitutional:  Negative for appetite change, chills and fever.  Respiratory:  Negative for cough, shortness of breath and wheezing.   Cardiovascular:  Negative for chest pain and palpitations.  Gastrointestinal:  Positive for abdominal distention (bloating), abdominal pain (pressure from gas) and anal bleeding (occ hemorrhoidal). Negative for blood in stool (no melena), constipation and diarrhea.       Objective:   Vitals:   08/13/22 1044  BP: 104/72  Pulse: 70   Temp: 98 F (36.7 C)  SpO2: 98%   BP Readings from Last 3 Encounters:  08/13/22 104/72  07/04/22 104/66  06/18/22 106/72   Wt Readings from Last 3 Encounters:  08/13/22 184 lb (83.5 kg)  07/04/22 188 lb 6.4 oz (85.5 kg)  06/18/22 182 lb (82.6 kg)   Body mass index is 24.95 kg/m.    Physical Exam Constitutional:      General: He is not in acute distress.    Appearance: Normal appearance. He is not ill-appearing.  HENT:     Head: Normocephalic and atraumatic.  Cardiovascular:     Rate and Rhythm: Normal rate and regular rhythm.  Pulmonary:     Effort: Pulmonary effort is normal.     Breath sounds: Normal breath sounds.  Abdominal:     General: There is no distension.     Palpations: Abdomen is soft.     Tenderness: There is no abdominal tenderness. There is no guarding or rebound.  Skin:    General: Skin is warm and dry.  Neurological:     Mental Status: He is alert.            Assessment & Plan:    See Problem List for Assessment and Plan of chronic medical  problems.

## 2022-08-14 DIAGNOSIS — G4733 Obstructive sleep apnea (adult) (pediatric): Secondary | ICD-10-CM | POA: Diagnosis not present

## 2022-08-28 ENCOUNTER — Encounter: Payer: Self-pay | Admitting: Internal Medicine

## 2022-09-05 ENCOUNTER — Encounter: Payer: Self-pay | Admitting: Physician Assistant

## 2022-09-14 DIAGNOSIS — G4733 Obstructive sleep apnea (adult) (pediatric): Secondary | ICD-10-CM | POA: Diagnosis not present

## 2022-09-17 NOTE — Progress Notes (Unsigned)
Celso Amy, PA-C [address]  [address]  [place], [place] [address]  Main: [phone]  Fax: [phone]   Gastroenterology Consultation  Referring Provider:     Pincus Sanes, MD Primary Care Physician:  Pincus Sanes, MD Primary Gastroenterologist:  Celso Amy, PA-C / Dr. Wyline Mood  Reason for Consultation:     GERD, Bloating, Belching, Gas        HPI:   Steven Wolf is a 67 y.o. y/o male referred for consultation & management  by Pincus Sanes, MD.   Here to evaluate GERD.  He saw his PCP, Dr. Lawerance Bach, 1 month ago for gas, bloating, flatulence, and frequent heartburn with increased belching.  Worse after a large meal.  Worse in the evening after dinner.  He had a new CPAP and is worried about swallowing air.  Drinks 1 soda per day.  Started Pepto-Bismol at night with some benefit.  He was started on omeprazole 20 Mg daily for 2 weeks.  Advised about GERD diet and stopping sodas.  Last EGD in 2002 by Dr. Jarold Motto @ [facility] (for GERD) was normal.  Last Colonoscopy by Dr. Caren Macadam at [facility] 05/2019 showed one small 5mm adenomatous polyp removed.  Repeat in 7 years. Prior Colonoscopies: No polyps in 2010 or 2005. Diminutive polyp in 2002.  He denies abdominal pain or rectal bleeding.  Has had mild intermittent constipation for many years.  Increased bowel sounds.  Abdomen feels full after eating or drinking.  Has increased belching.  Has a lot of generalized abdominal bloating in his mid abdomen which is uncomfortable.  Notices acid coming up into his throat when he belches.  Denies dysphagia.  GERD is worse at night after dinner and better in the morning.  GERD is improving on Prilosec 20 Mg daily.  He has concerns about long-term adverse side effects of PPIs.  Using eating smaller meals and avoiding junk food.  Has lost 10 pounds intentionally in the past 6 weeks with changing diet.    Past Medical History:  Diagnosis Date   Anxiety    Benign prostatic  hypertrophy    Diverticulosis of colon    Gilbert syndrome    Hypertension    Microscopic hematuria    Nephrolithiasis     X 2; Dr Aldean Ast   Sleep apnea    USES CPAP   Syncope 02/2007   NOCTURNAL POST MICTURTION   Urticaria     Past Surgical History:  Procedure Laterality Date   APPENDECTOMY     COLONOSCOPY  2010   NORMAL(pt states 2 other colonoscopies in the past)   COLONOSCOPY W/ POLYPECTOMY  2002   Tics 2005 & 2010; due 2020, Dr Jarold Motto   LIVER BIOPSY     X 1 for elevated LFTs with NEGATIVE  hepatitis serologies   TONSILLECTOMY     TONSILLECTOMY     PT STATES HAS HAD IT TWICE   UPPER GASTROINTESTINAL ENDOSCOPY  1995    Prior to Admission medications   Medication Sig Start Date End Date Taking? Authorizing Provider  aspirin 81 MG tablet Take 81 mg by mouth daily.    [provider]  Cholecalciferol (VITAMIN D) 2000 UNITS CAPS Take 2,000 Units by mouth daily.    [provider]  Coenzyme Q10 (COQ10) 100 MG CAPS Take 1 tablet by mouth daily.    [provider]  fish oil-omega-3 fatty acids 1000 MG capsule Take 1 g by mouth 2 (two)  times daily.    [provider]  FLUoxetine (PROZAC) 20 MG tablet Take 1 tablet (20 mg total) by mouth daily. 06/18/22   Burns, [name], MD  OMEGA-3 KRILL OIL PO Take 1 capsule by mouth daily.    [provider]  omeprazole (PRILOSEC) 20 MG capsule Take 1 capsule (20 mg total) by mouth daily. Take daily 30 minutes prior to a meal for 2 weeks then take as needed for reflux 08/13/22   Pincus Sanes, MD  valsartan (DIOVAN) 80 MG tablet Take 1 tablet (80 mg total) by mouth daily. 06/18/22   Pincus Sanes, MD    Family History  Problem Relation Age of Onset   Breast cancer Mother    Rheumatologic disease Mother        RA   Heart disease Father        PCV;MI ? 25   Polycythemia Father    Leukemia Paternal Uncle    Polycythemia Paternal Uncle    Heart attack Paternal Grandfather        > 21    Stroke Maternal Grandmother        . 65   Sleep apnea Sister    Diabetes Neg Hx    Colon cancer Neg Hx    Colon polyps Neg Hx    Esophageal cancer Neg Hx    Stomach cancer Neg Hx    Rectal cancer Neg Hx      Social History   Tobacco Use   Smoking status: Never   Smokeless tobacco: Never   Tobacco comments:    Second hand smoke  Vaping Use   Vaping Use: Never used  Substance Use Topics   Alcohol use: No   Drug use: No    Allergies as of 09/18/2022 - Review Complete 08/13/2022  Allergen Reaction Noted   Ramipril     Penicillins      Review of Systems:    All systems reviewed and negative except where noted in HPI.   Physical Exam:  There were no vitals taken for this visit. No LMP for male patient. Psych:  Alert and cooperative. Normal mood and affect. General:   Alert,  Well-developed, well-nourished, pleasant and cooperative in NAD Head:  Normocephalic and atraumatic. Eyes:  Sclera clear, no icterus.   Conjunctiva pink. Neck:  Supple; no masses or thyromegaly. Lungs:  Respirations even and unlabored.  Clear throughout to auscultation.   No wheezes, crackles, or rhonchi. No acute distress. Heart:  Regular rate and rhythm; no murmurs, clicks, rubs, or gallops. Abdomen:  Normal bowel sounds.  No bruits.  Soft, and non-distended without masses, hepatosplenomegaly or hernias noted.  No Tenderness.  No guarding or rebound tenderness.    Neurologic:  Alert and oriented x3;  grossly normal neurologically. Psych:  Alert and cooperative. Normal mood and affect.  Imaging Studies: No results found.  Assessment and Plan:   Steven Wolf is a 67 y.o. y/o male has been referred for GERD.  Acid reflux is improving on Prilosec 20 mg once daily for the past 6 weeks.  He has chronic intermittent intermittent acid reflux for over 20 years.  He is worried about long-term PPI use and adverse side effects.  He has a lot of belching, flatulence, and generalized abdominal bloating and  gas.  I am ordering H. pylori stool test.  Also scheduling EGD for further evaluation.  1.  GERD  Scheduling EGD - Screen for Barretts I discussed risks of EGD with  patient to include risk of bleeding, perforation, and risk of sedation.  Patient expressed understanding and agrees to proceed with EGD.  Recommend Lifestyle Modifications to prevent Acid Reflux.  Rec. Avoid coffee, sodas, peppermint, citrus fruits, and spicey foods.  Avoid eating 2-3 hours before bedtime.   2.  Belching  Stool test for H. Pylori (off PPI x 2 weeks)  Avoid sodas and carbonated drinks.  3.  Flatulence, intestinal gas  Low FODMAP diet.  Try OTC Gas-X 2 capsules 3 times daily before meals.  Patient education handout was given from up-to-date.  Avoid gas producing foods such as milk, dairy, high fructose corn syrup.  Consider trial of Xifaxan if gas and bloating persist.  4.  History of adenomatous colon polyp -1 small polyp removed 05/2019.  Repeat colonoscopy in 7 years (05/2026).  Follow up in 4 weeks after EGD procedure with TG.  Celso Amy, PA-C

## 2022-09-18 ENCOUNTER — Ambulatory Visit: Payer: Medicare PPO | Admitting: Physician Assistant

## 2022-09-18 ENCOUNTER — Encounter: Payer: Self-pay | Admitting: Physician Assistant

## 2022-09-18 VITALS — BP 113/80 | HR 96 | Temp 97.9°F | Ht 72.0 in | Wt 174.0 lb

## 2022-09-18 DIAGNOSIS — R140 Abdominal distension (gaseous): Secondary | ICD-10-CM

## 2022-09-18 DIAGNOSIS — K219 Gastro-esophageal reflux disease without esophagitis: Secondary | ICD-10-CM | POA: Diagnosis not present

## 2022-09-18 DIAGNOSIS — R142 Eructation: Secondary | ICD-10-CM

## 2022-09-18 NOTE — Patient Instructions (Signed)
Hold / Stop Omeprazole for 2 weeks and then collect stool sample to check for H. Pylori.

## 2022-09-25 ENCOUNTER — Encounter: Payer: Self-pay | Admitting: Physician Assistant

## 2022-09-25 ENCOUNTER — Encounter: Payer: Self-pay | Admitting: Gastroenterology

## 2022-09-25 ENCOUNTER — Other Ambulatory Visit: Payer: Self-pay

## 2022-09-25 MED ORDER — ONDANSETRON HCL 4 MG PO TABS: 4.0000 mg | ORAL_TABLET | Freq: Four times a day (QID) | ORAL | 0 refills | Status: DC | PRN

## 2022-09-25 NOTE — Anesthesia Preprocedure Evaluation (Addendum)
Anesthesia Evaluation  Patient identified by MRN, date of birth, ID band Patient awake    Reviewed: Allergy & Precautions, H&P , NPO status , Patient's Chart, lab work & pertinent test results  Airway Mallampati: II  TM Distance: >3 FB Neck ROM: Full    Dental no notable dental hx.    Pulmonary neg pulmonary ROS, sleep apnea    Pulmonary exam normal breath sounds clear to auscultation       Cardiovascular hypertension, negative cardio ROS Normal cardiovascular exam Rhythm:Regular Rate:Normal     Neuro/Psych   Anxiety     negative neurological ROS  negative psych ROS   GI/Hepatic Neg liver ROS,GERD  ,,  Endo/Other  negative endocrine ROS    Renal/GU Renal disease  negative genitourinary   Musculoskeletal negative musculoskeletal ROS (+)    Abdominal   Peds negative pediatric ROS (+)  Hematology negative hematology ROS (+)   Anesthesia Other Findings Hypertension  Syncope Benign prostatic hypertrophy  Diverticulosis of colon Microscopic hematuria  Nephrolithiasis 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  Anxiety Sleep apnea  Urticaria History of kidney stones  GERD (gastroesophageal reflux disease) Motion sickness  Tingling in extremities     Reproductive/Obstetrics negative OB ROS                             Anesthesia Physical Anesthesia Plan  ASA: 2  Anesthesia Plan: General   Post-op Pain Management:    Induction: Intravenous  PONV Risk Score and Plan:   Airway Management Planned: Natural Airway and Nasal Cannula  Additional Equipment:   Intra-op Plan:   Post-operative Plan:   Informed Consent: I have reviewed the patients History and Physical, chart, labs and discussed the procedure including the risks, benefits and alternatives for the proposed anesthesia with the patient or authorized representative who has indicated his/her  understanding and acceptance.     Dental Advisory Given  Plan Discussed with: Anesthesiologist, CRNA and Surgeon  Anesthesia Plan Comments: (Patient consented for risks of anesthesia including but not limited to:  - adverse reactions to medications - risk of airway placement if required - damage to eyes, teeth, lips or other oral mucosa - nerve damage due to positioning  - sore throat or hoarseness - Damage to heart, brain, nerves, lungs, other parts of body or loss of life  Patient voiced understanding.)       Anesthesia Quick Evaluation

## 2022-09-30 ENCOUNTER — Encounter: Payer: Self-pay | Admitting: Internal Medicine

## 2022-09-30 ENCOUNTER — Encounter
Admission: RE | Disposition: A | Discharge status: Home / Self Care | Payer: Self-pay | Source: Home / Self Care | Attending: Gastroenterology

## 2022-09-30 ENCOUNTER — Other Ambulatory Visit: Payer: Self-pay

## 2022-09-30 ENCOUNTER — Ambulatory Visit: Payer: Medicare PPO | Admitting: Anesthesiology

## 2022-09-30 ENCOUNTER — Ambulatory Visit
Admission: RE | Admit: 2022-09-30 | Discharge: 2022-09-30 | Disposition: A | Discharge status: Home / Self Care | Payer: Medicare PPO | Attending: Gastroenterology | Admitting: Gastroenterology

## 2022-09-30 DIAGNOSIS — G4730 Sleep apnea, unspecified: Secondary | ICD-10-CM | POA: Diagnosis not present

## 2022-09-30 DIAGNOSIS — K311 Adult hypertrophic pyloric stenosis: Secondary | ICD-10-CM | POA: Insufficient documentation

## 2022-09-30 DIAGNOSIS — K2100 Gastro-esophageal reflux disease with esophagitis, without bleeding: Secondary | ICD-10-CM | POA: Insufficient documentation

## 2022-09-30 DIAGNOSIS — D490 Neoplasm of unspecified behavior of digestive system: Secondary | ICD-10-CM

## 2022-09-30 DIAGNOSIS — K219 Gastro-esophageal reflux disease without esophagitis: Secondary | ICD-10-CM | POA: Diagnosis not present

## 2022-09-30 DIAGNOSIS — K3189 Other diseases of stomach and duodenum: Secondary | ICD-10-CM | POA: Diagnosis not present

## 2022-09-30 DIAGNOSIS — C164 Malignant neoplasm of pylorus: Secondary | ICD-10-CM | POA: Insufficient documentation

## 2022-09-30 DIAGNOSIS — F419 Anxiety disorder, unspecified: Secondary | ICD-10-CM | POA: Insufficient documentation

## 2022-09-30 DIAGNOSIS — N400 Enlarged prostate without lower urinary tract symptoms: Secondary | ICD-10-CM | POA: Insufficient documentation

## 2022-09-30 DIAGNOSIS — C169 Malignant neoplasm of stomach, unspecified: Secondary | ICD-10-CM | POA: Diagnosis not present

## 2022-09-30 DIAGNOSIS — R634 Abnormal weight loss: Secondary | ICD-10-CM | POA: Diagnosis not present

## 2022-09-30 DIAGNOSIS — I10 Essential (primary) hypertension: Secondary | ICD-10-CM | POA: Insufficient documentation

## 2022-09-30 DIAGNOSIS — R12 Heartburn: Secondary | ICD-10-CM | POA: Diagnosis present

## 2022-09-30 HISTORY — DX: Gastro-esophageal reflux disease without esophagitis: K21.9

## 2022-09-30 HISTORY — DX: Personal history of urinary calculi: Z87.442

## 2022-09-30 HISTORY — DX: Motion sickness, initial encounter: T75.3XXA

## 2022-09-30 HISTORY — PX: ESOPHAGOGASTRODUODENOSCOPY (EGD) WITH PROPOFOL: SHX5813

## 2022-09-30 SURGERY — ESOPHAGOGASTRODUODENOSCOPY (EGD) WITH PROPOFOL
Anesthesia: General | Site: Mouth

## 2022-09-30 MED ORDER — SODIUM CHLORIDE 0.9 % IV SOLN: INTRAVENOUS | Status: DC

## 2022-09-30 MED ORDER — PROPOFOL 10 MG/ML IV BOLUS
INTRAVENOUS | Status: DC | PRN
  Administered 2022-09-30: 100 mg via INTRAVENOUS
  Administered 2022-09-30: 40 mg via INTRAVENOUS

## 2022-09-30 MED ORDER — STERILE WATER FOR IRRIGATION IR SOLN
Status: DC | PRN
  Administered 2022-09-30: 50 mL

## 2022-09-30 MED ORDER — LIDOCAINE HCL (CARDIAC) PF 100 MG/5ML IV SOSY
PREFILLED_SYRINGE | INTRAVENOUS | Status: DC | PRN
  Administered 2022-09-30: 60 mg via INTRAVENOUS

## 2022-09-30 MED ORDER — LACTATED RINGERS IV SOLN: INTRAVENOUS | Status: DC

## 2022-09-30 SURGICAL SUPPLY — 8 items
BLOCK BITE 60FR ADLT L/F GRN (MISCELLANEOUS) ×1 IMPLANT
FORCEPS BIOP RAD 4 LRG CAP 4 (CUTTING FORCEPS) IMPLANT
GOWN CVR UNV OPN BCK APRN NK (MISCELLANEOUS) ×2 IMPLANT
GOWN ISOL THUMB LOOP REG UNIV (MISCELLANEOUS) ×2
KIT PRC NS LF DISP ENDO (KITS) ×1 IMPLANT
KIT PROCEDURE OLYMPUS (KITS) ×1
MANIFOLD NEPTUNE II (INSTRUMENTS) ×1 IMPLANT
WATER STERILE IRR 250ML POUR (IV SOLUTION) ×1 IMPLANT

## 2022-09-30 NOTE — Transfer of Care (Signed)
Immediate Anesthesia Transfer of Care Note  Patient: Steven Wolf  Procedure(s) Performed: ESOPHAGOGASTRODUODENOSCOPY (EGD) WITH PROPOFOL  Patient Location: PACU  Anesthesia Type: General  Level of Consciousness: awake, alert  and patient cooperative  Airway and Oxygen Therapy: Patient Spontanous Breathing and Patient connected to supplemental oxygen  Post-op Assessment: Post-op Vital signs reviewed, Patient's Cardiovascular Status Stable, Respiratory Function Stable, Patent Airway and No signs of Nausea or vomiting  Post-op Vital Signs: Reviewed and stable  Complications: No notable events documented.

## 2022-09-30 NOTE — Anesthesia Postprocedure Evaluation (Signed)
Anesthesia Post Note  Patient: Steven Wolf  Procedure(s) Performed: ESOPHAGOGASTRODUODENOSCOPY (EGD) WITH BIOPSY (Mouth)  Patient location during evaluation: PACU Anesthesia Type: General Level of consciousness: awake and alert Pain management: pain level controlled Vital Signs Assessment: post-procedure vital signs reviewed and stable Respiratory status: spontaneous breathing, nonlabored ventilation, respiratory function stable and patient connected to nasal cannula oxygen Cardiovascular status: blood pressure returned to baseline and stable Postop Assessment: no apparent nausea or vomiting Anesthetic complications: no   No notable events documented.   Last Vitals:  Vitals:   09/30/22 1146 09/30/22 1200  BP: 102/78 121/87  Pulse: 92 79  Resp: 18 17  Temp:  (!) 36.1 C  SpO2: 98% 97%    Last Pain:  Vitals:   09/30/22 1200  TempSrc:   PainSc: 0-No pain                 Marlia Schewe C Magen Suriano

## 2022-09-30 NOTE — H&P (Signed)
Midge Minium, MD [facility] [address], [address] [place], [place] [address] Phone:[phone] Fax : [phone]  Primary Care Physician:  Pincus Sanes, MD Primary Gastroenterologist:  Dr. Servando Snare  Pre-Procedure History & Physical: HPI:  Steven Wolf is a 67 y.o. male is here for an endoscopy.   Past Medical History:  Diagnosis Date   Anxiety    Benign prostatic hypertrophy    Diverticulosis of colon    GERD (gastroesophageal reflux disease)    Sullivan Lone syndrome    History of kidney stones    Hypertension    Microscopic hematuria    Motion sickness    ocean boats, curvy roads   Nephrolithiasis     X 2; Dr Aldean Ast   Sleep apnea    USES CPAP   Syncope 02/2007   NOCTURNAL POST MICTURTION   Urticaria     Past Surgical History:  Procedure Laterality Date   APPENDECTOMY     COLONOSCOPY  2010   NORMAL(pt states 2 other colonoscopies in the past)   COLONOSCOPY W/ POLYPECTOMY  2002   Tics 2005 & 2010; due 2020, Dr Jarold Motto   LIVER BIOPSY     X 1 for elevated LFTs with NEGATIVE  hepatitis serologies   TONSILLECTOMY     TONSILLECTOMY     PT STATES HAS HAD IT TWICE   UPPER GASTROINTESTINAL ENDOSCOPY  1995    Prior to Admission medications   Medication Sig Start Date End Date Taking? Authorizing Provider  aspirin 81 MG tablet Take 81 mg by mouth daily.   Yes [provider]  Cholecalciferol (VITAMIN D) 2000 UNITS CAPS Take 2,000 Units by mouth daily.   Yes [provider]  Coenzyme Q10 (COQ10) 100 MG CAPS Take 1 tablet by mouth daily.   Yes [provider]  fish oil-omega-3 fatty acids 1000 MG capsule Take 1 g by mouth 2 (two) times daily.   Yes [provider]  FLUoxetine (PROZAC) 20 MG tablet Take 1 tablet (20 mg total) by mouth daily. 06/18/22  Yes Burns, [name], MD  OMEGA-3 KRILL OIL PO Take 1 capsule by mouth daily.   Yes [provider]  omeprazole (PRILOSEC) 20 MG capsule Take 1 capsule (20 mg total) by mouth daily.  Take daily 30 minutes prior to a meal for 2 weeks then take as needed for reflux 08/13/22  Yes Burns, [name], MD  ondansetron (ZOFRAN) 4 MG tablet Take 1 tablet (4 mg total) by mouth every 6 (six) hours as needed for nausea or vomiting. 09/25/22  Yes Celso Amy, PA-C  valsartan (DIOVAN) 80 MG tablet Take 1 tablet (80 mg total) by mouth daily. 06/18/22  Yes Pincus Sanes, MD    Allergies as of 09/18/2022 - Review Complete 09/18/2022  Allergen Reaction Noted   Ramipril     Penicillins      Family History  Problem Relation Age of Onset   Breast cancer Mother    Rheumatologic disease Mother        RA   Heart disease Father        PCV;MI ? 29   Polycythemia Father    Leukemia Paternal Uncle    Polycythemia Paternal Uncle    Heart attack Paternal Grandfather        > 40   Stroke Maternal Grandmother        . 65   Sleep apnea Sister    Diabetes Neg Hx    Colon cancer Neg Hx  Colon polyps Neg Hx    Esophageal cancer Neg Hx    Stomach cancer Neg Hx    Rectal cancer Neg Hx     Social History   Socioeconomic History   Marital status: Married    Spouse name: Not on file   Number of children: 0   Years of education: Not on file   Highest education level: Master's degree (e.g., MA, MS, MEng, MEd, MSW, MBA)  Occupational History   Not on file  Tobacco Use   Smoking status: Never   Smokeless tobacco: Never   Tobacco comments:    Second hand smoke  Vaping Use   Vaping Use: Never used  Substance and Sexual Activity   Alcohol use: No   Drug use: No   Sexual activity: Not on file  Other Topics Concern   Not on file  Social History Narrative   Right Handed   Lives in a two story home    Drinks some caffeine    Social Determinants of Health   Financial Resource Strain: Low Risk  (08/12/2022)   Overall Financial Resource Strain (CARDIA)    Difficulty of Paying Living Expenses: Not hard at all  Food Insecurity: No Food Insecurity (08/12/2022)   Hunger Vital Sign     Worried About Running Out of Food in the Last Year: Never true    Ran Out of Food in the Last Year: Never true  Transportation Needs: No Transportation Needs (08/12/2022)   PRAPARE - Administrator, Civil Service (Medical): No    Lack of Transportation (Non-Medical): No  Physical Activity: Sufficiently Active (08/12/2022)   Exercise Vital Sign    Days of Exercise per Week: 6 days    Minutes of Exercise per Session: 50 min  Stress: Stress Concern Present (08/12/2022)   [employer] of Occupational Health - Occupational Stress Questionnaire    Feeling of Stress : To some extent  Social Connections: Unknown (08/12/2022)   Social Connection and Isolation Panel [NHANES]    Frequency of Communication with Friends and Family: Once a week    Frequency of Social Gatherings with Friends and Family: Patient declined    Attends Religious Services: Never    Diplomatic Services operational officer: Not on file    Attends Engineer, structural: Not on file    Marital Status: Married  Catering manager Violence: Not on file    Review of Systems: See HPI, otherwise negative ROS  Physical Exam: BP 123/85   Pulse 96   Temp 98.4 F (36.9 C) (Temporal)   Resp 19   Ht 6' 0.01" (1.829 m)   Wt 74.8 kg   SpO2 97%   BMI 22.35 kg/m  General:   Alert,  pleasant and cooperative in NAD Head:  Normocephalic and atraumatic. Neck:  Supple; no masses or thyromegaly. Lungs:  Clear throughout to auscultation.    Heart:  Regular rate and rhythm. Abdomen:  Soft, nontender and nondistended. Normal bowel sounds, without guarding, and without rebound.   Neurologic:  Alert and  oriented x4;  grossly normal neurologically.  Impression/Plan: Steven Wolf is here for an endoscopy to be performed for GERD and weight loss  Risks, benefits, limitations, and alternatives regarding  endoscopy have been reviewed with the patient.  Questions have been answered.  All parties  agreeable.   Midge Minium, MD  09/30/2022, 11:16 AM

## 2022-09-30 NOTE — Op Note (Signed)
[facility]astroenter[facility] Patient Name: Steven Wolf Procedure Date: 09/30/2022 11:20 AM MRN: [id number] Account #: [id number] Date of Birth: 11-11-1955 Admit Type: Outpatient Age: 67 Room: [facility] OR ROOM 01 Gender: Male Note Status: Finalized Instrument Name: [id number] Procedure:             Upper GI endoscopy Indications:           Heartburn, Weight loss Providers:             Midge Minium MD, MD Referring MD:          Pincus Sanes, MD (Referring MD) Medicines:             Propofol per Anesthesia Complications:         No immediate complications. Procedure:             Pre-Anesthesia Assessment:                        - Prior to the procedure, a History and Physical was                         performed, and patient medications and allergies were                         reviewed. The patient's tolerance of previous                         anesthesia was also reviewed. The risks and benefits                         of the procedure and the sedation options and risks                         were discussed with the patient. All questions were                         answered, and informed consent was obtained. Prior                         Anticoagulants: The patient has taken no anticoagulant                         or antiplatelet agents. ASA Grade Assessment: II - A                         patient with mild systemic disease. After reviewing                         the risks and benefits, the patient was deemed in                         satisfactory condition to undergo the procedure.                        After obtaining informed consent, the endoscope was                         passed under direct vision. Throughout the procedure,  the patient's blood pressure, pulse, and oxygen                         saturations were monitored continuously. The was                         introduced through the mouth, and advanced to the                          pylorus. The upper GI endoscopy was accomplished                         without difficulty. The patient tolerated the                         procedure well. Findings:      LA Grade A (one or more mucosal breaks less than 5 mm, not extending       between tops of 2 mucosal folds) esophagitis with no bleeding was found       at the gastroesophageal junction.      A large, infiltrative, circumferential mass with oozing bleeding and       stigmata of recent bleeding was found in the cardia, in the gastric       body, on the anterior wall of the stomach, on the lesser curvature of       the stomach, at the incisura, in the gastric antrum, in the prepyloric       region of the stomach and at the pylorus. Biopsies were taken with a       cold forceps for histology.      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 This was non-traversed. Impression:            - LA Grade A reflux esophagitis with no bleeding.                        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 Biopsied.                        - Gastric stenosis was found at the pylorus. Recommendation:        - Discharge patient to home.                        - Resume previous diet.                        - Continue present medications.                        -  Await pathology results. Procedure Code(s):     --- Professional ---                        [phone], 52, Esophagogastroduodenoscopy, flexible,                         transoral; with biopsy, single or multiple Diagnosis Code(s):     --- Professional ---                        R63.4, Abnormal weight loss                        R12, Heartburn                        D49.0, Neoplasm of  unspecified behavior of digestive                         system CPT copyright 2022 American Medical Association. All rights reserved. The codes documented in this report are preliminary and upon coder review may  be revised to meet current compliance requirements. Midge Minium MD, MD 09/30/2022 11:37:10 AM This report has been signed electronically. Number of Addenda: 0 Note Initiated On: 09/30/2022 11:20 AM Total Procedure Duration: 0 hours 6 minutes 16 seconds  Estimated Blood Loss:  Estimated blood loss: none.      [facility]

## 2022-10-01 ENCOUNTER — Encounter: Payer: Self-pay | Admitting: Physician Assistant

## 2022-10-01 ENCOUNTER — Encounter: Payer: Self-pay | Admitting: Gastroenterology

## 2022-10-01 ENCOUNTER — Other Ambulatory Visit: Payer: Self-pay

## 2022-10-01 DIAGNOSIS — C169 Malignant neoplasm of stomach, unspecified: Secondary | ICD-10-CM | POA: Insufficient documentation

## 2022-10-08 ENCOUNTER — Telehealth: Payer: Self-pay

## 2022-10-08 ENCOUNTER — Other Ambulatory Visit: Payer: Self-pay

## 2022-10-08 NOTE — Telephone Encounter (Signed)
Steven Wolf and wife called wanting to know what can the Steven Wolf eat & drink to prevent getting dehydrated.  Steven Wolf stated he has a small intestine partial blockage.  Steven Wolf stated he's been drinking protein drinks and eating scrambled eggs.  Steven Wolf stated when he drinks thin liquids its hard for him to tolerate.  Steven Wolf stated he's been vomiting every night for the past 3 days and he and his wife are concerned about dehydration.  Asked Steven Wolf if he currently have a feeding tube.  Steven Wolf stated "No".  Steven Wolf and spouse are unaware of the plan of care and would like to know the Steven Wolf's next steps.  Both patient and spouse are anxious and scared of the Steven Wolf becoming malnutrition & dehydrated.  Steven Wolf is scheduled to see Dr. Mosetta Putt on 10/09/2022.  Informed both Steven Wolf and spouse that this nurse will notify Dr. Mosetta Putt of their call and concerns.

## 2022-10-08 NOTE — Telephone Encounter (Signed)
Spoke with patient and spouse to inform them that Dr. Mosetta Putt would like for them to come in at 12:30 pm on 10/09/2022 to be seen by Santiago Glad, NP and Dr. Mosetta Putt.  Pt may possibly be admitted to the hospital based off telephone conversation earlier today with this nurse (see telephone note).  Pt and spouse confirmed appt changes.

## 2022-10-09 ENCOUNTER — Other Ambulatory Visit (HOSPITAL_COMMUNITY): Payer: Self-pay

## 2022-10-09 ENCOUNTER — Inpatient Hospital Stay: Payer: Medicare PPO | Attending: Hematology | Admitting: Nurse Practitioner

## 2022-10-09 ENCOUNTER — Encounter: Payer: Self-pay | Admitting: Nurse Practitioner

## 2022-10-09 ENCOUNTER — Other Ambulatory Visit: Payer: Self-pay

## 2022-10-09 ENCOUNTER — Ambulatory Visit: Payer: Medicare PPO | Admitting: Physician Assistant

## 2022-10-09 ENCOUNTER — Inpatient Hospital Stay: Payer: Medicare PPO

## 2022-10-09 VITALS — BP 112/89 | HR 121 | Temp 98.1°F | Resp 17 | Wt 160.2 lb

## 2022-10-09 DIAGNOSIS — C163 Malignant neoplasm of pyloric antrum: Principal | ICD-10-CM | POA: Diagnosis present

## 2022-10-09 DIAGNOSIS — R1310 Dysphagia, unspecified: Secondary | ICD-10-CM | POA: Insufficient documentation

## 2022-10-09 DIAGNOSIS — K5900 Constipation, unspecified: Secondary | ICD-10-CM | POA: Insufficient documentation

## 2022-10-09 DIAGNOSIS — K449 Diaphragmatic hernia without obstruction or gangrene: Secondary | ICD-10-CM | POA: Diagnosis present

## 2022-10-09 DIAGNOSIS — G4730 Sleep apnea, unspecified: Secondary | ICD-10-CM | POA: Diagnosis present

## 2022-10-09 DIAGNOSIS — R188 Other ascites: Secondary | ICD-10-CM | POA: Diagnosis present

## 2022-10-09 DIAGNOSIS — Z79899 Other long term (current) drug therapy: Secondary | ICD-10-CM

## 2022-10-09 DIAGNOSIS — F32A Depression, unspecified: Secondary | ICD-10-CM | POA: Diagnosis present

## 2022-10-09 DIAGNOSIS — R634 Abnormal weight loss: Secondary | ICD-10-CM | POA: Diagnosis not present

## 2022-10-09 DIAGNOSIS — Z823 Family history of stroke: Secondary | ICD-10-CM

## 2022-10-09 DIAGNOSIS — K632 Fistula of intestine: Secondary | ICD-10-CM | POA: Diagnosis not present

## 2022-10-09 DIAGNOSIS — G893 Neoplasm related pain (acute) (chronic): Secondary | ICD-10-CM | POA: Diagnosis present

## 2022-10-09 DIAGNOSIS — I10 Essential (primary) hypertension: Secondary | ICD-10-CM | POA: Diagnosis present

## 2022-10-09 DIAGNOSIS — Z832 Family history of diseases of the blood and blood-forming organs and certain disorders involving the immune mechanism: Secondary | ICD-10-CM

## 2022-10-09 DIAGNOSIS — E43 Unspecified severe protein-calorie malnutrition: Secondary | ICD-10-CM | POA: Diagnosis present

## 2022-10-09 DIAGNOSIS — C169 Malignant neoplasm of stomach, unspecified: Secondary | ICD-10-CM

## 2022-10-09 DIAGNOSIS — Z803 Family history of malignant neoplasm of breast: Secondary | ICD-10-CM

## 2022-10-09 DIAGNOSIS — E860 Dehydration: Secondary | ICD-10-CM | POA: Diagnosis not present

## 2022-10-09 DIAGNOSIS — E876 Hypokalemia: Secondary | ICD-10-CM | POA: Diagnosis present

## 2022-10-09 DIAGNOSIS — N400 Enlarged prostate without lower urinary tract symptoms: Secondary | ICD-10-CM | POA: Diagnosis present

## 2022-10-09 DIAGNOSIS — K311 Adult hypertrophic pyloric stenosis: Secondary | ICD-10-CM | POA: Diagnosis present

## 2022-10-09 DIAGNOSIS — R627 Adult failure to thrive: Secondary | ICD-10-CM | POA: Diagnosis present

## 2022-10-09 DIAGNOSIS — R860 Abnormal level of enzymes in specimens from male genital organs: Secondary | ICD-10-CM

## 2022-10-09 DIAGNOSIS — C786 Secondary malignant neoplasm of retroperitoneum and peritoneum: Secondary | ICD-10-CM | POA: Diagnosis present

## 2022-10-09 DIAGNOSIS — Z6823 Body mass index (BMI) 23.0-23.9, adult: Secondary | ICD-10-CM

## 2022-10-09 DIAGNOSIS — Z806 Family history of leukemia: Secondary | ICD-10-CM

## 2022-10-09 DIAGNOSIS — K567 Ileus, unspecified: Secondary | ICD-10-CM | POA: Diagnosis not present

## 2022-10-09 DIAGNOSIS — Z888 Allergy status to other drugs, medicaments and biological substances status: Secondary | ICD-10-CM

## 2022-10-09 DIAGNOSIS — Z8249 Family history of ischemic heart disease and other diseases of the circulatory system: Secondary | ICD-10-CM

## 2022-10-09 DIAGNOSIS — J982 Interstitial emphysema: Secondary | ICD-10-CM | POA: Diagnosis not present

## 2022-10-09 DIAGNOSIS — Z880 Allergy status to penicillin: Secondary | ICD-10-CM

## 2022-10-09 DIAGNOSIS — K2100 Gastro-esophageal reflux disease with esophagitis, without bleeding: Secondary | ICD-10-CM | POA: Diagnosis present

## 2022-10-09 DIAGNOSIS — F419 Anxiety disorder, unspecified: Secondary | ICD-10-CM | POA: Diagnosis present

## 2022-10-09 DIAGNOSIS — Z515 Encounter for palliative care: Secondary | ICD-10-CM

## 2022-10-09 LAB — FERRITIN: Ferritin: 98 ng/mL (ref 24–336)

## 2022-10-09 LAB — CMP (CANCER CENTER ONLY)
ALT: 56 U/L — ABNORMAL HIGH (ref 0–44)
AST: 27 U/L (ref 15–41)
Albumin: 4.5 g/dL (ref 3.5–5.0)
Alkaline Phosphatase: 63 U/L (ref 38–126)
Anion gap: 10 (ref 5–15)
BUN: 37 mg/dL — ABNORMAL HIGH (ref 8–23)
CO2: 27 mmol/L (ref 22–32)
Calcium: 10.3 mg/dL (ref 8.9–10.3)
Chloride: 100 mmol/L (ref 98–111)
Creatinine: 1.16 mg/dL (ref 0.61–1.24)
GFR, Estimated: >60 mL/min (ref >60)
Glucose, Bld: 105 mg/dL — ABNORMAL HIGH (ref 70–99)
Potassium: 3.9 mmol/L (ref 3.5–5.1)
Sodium: 137 mmol/L (ref 135–145)
Total Bilirubin: 0.7 mg/dL (ref 0.3–1.2)
Total Protein: 7.3 g/dL (ref 6.5–8.1)

## 2022-10-09 LAB — CBC WITH DIFFERENTIAL (CANCER CENTER ONLY)
Abs Immature Granulocytes: 0.01 10*3/uL (ref 0.00–0.07)
Basophils Absolute: 0.1 10*3/uL (ref 0.0–0.1)
Basophils Relative: 1 %
Eosinophils Absolute: 0.2 10*3/uL (ref 0.0–0.5)
Eosinophils Relative: 2 %
HCT: 45.8 % (ref 39.0–52.0)
Hemoglobin: 15.5 g/dL (ref 13.0–17.0)
Immature Granulocytes: 0 %
Lymphocytes Relative: 13 %
Lymphs Abs: 1 10*3/uL (ref 0.7–4.0)
MCH: 30.2 pg (ref 26.0–34.0)
MCHC: 33.8 g/dL (ref 30.0–36.0)
MCV: 89.3 fL (ref 80.0–100.0)
Monocytes Absolute: 0.7 10*3/uL (ref 0.1–1.0)
Monocytes Relative: 10 %
Neutro Abs: 5.5 10*3/uL (ref 1.7–7.7)
Neutrophils Relative %: 74 %
Platelet Count: 263 10*3/uL (ref 150–400)
RBC: 5.13 MIL/uL (ref 4.22–5.81)
RDW: 12.4 % (ref 11.5–15.5)
WBC Count: 7.5 10*3/uL (ref 4.0–10.5)
nRBC: 0 % (ref 0.0–0.2)

## 2022-10-09 LAB — IRON AND IRON BINDING CAPACITY (CC-WL,HP ONLY)
Iron: 59 ug/dL (ref 45–182)
Saturation Ratios: 20 % (ref 17.9–39.5)
TIBC: 294 ug/dL (ref 250–450)
UIBC: 235 ug/dL (ref 117–376)

## 2022-10-09 LAB — CEA (ACCESS): CEA (CHCC): <1 ng/mL (ref 0.00–5.00)

## 2022-10-09 MED ORDER — PROCHLORPERAZINE MALEATE 10 MG PO TABS
10.0000 mg | ORAL_TABLET | Freq: Four times a day (QID) | ORAL | 1 refills | Status: DC | PRN
Start: 2022-10-09 — End: 2022-10-09

## 2022-10-09 MED ORDER — PROCHLORPERAZINE MALEATE 10 MG PO TABS
10.0000 mg | ORAL_TABLET | Freq: Four times a day (QID) | ORAL | 1 refills | Status: DC | PRN
Start: 2022-10-09 — End: 2022-11-03
  Filled 2022-10-09 (×2): qty 30, 8d supply, fill #0

## 2022-10-09 MED ORDER — SENNA 8.6 MG PO TABS
1.0000 | ORAL_TABLET | Freq: Every day | ORAL | 0 refills | Status: DC | PRN
Start: 2022-10-09 — End: 2022-11-03

## 2022-10-09 MED ORDER — SODIUM CHLORIDE 0.9 % IV SOLN: Freq: Once | INTRAVENOUS | Status: AC

## 2022-10-09 NOTE — Progress Notes (Addendum)
[facility]   [phone] [phone]   Clinic New Consult Note   Patient Care Team: Pincus Sanes, MD as PCP - General (Internal Medicine) Glendale Chard, DO as Consulting Physician (Neurology) 10/09/2022  CHIEF COMPLAINTS/PURPOSE OF CONSULTATION:  Gastric cancer, referred by GI Dr. Midge Minium  HISTORY OF PRESENTING ILLNESS:  Steven Wolf is a 67 y.o. male with PMH including HTN, HL, pre-DM, OSA, and BPH. Previous colonoscopies by Dr. Myrtie Neither. Here because of newly diagnosed gastric cancer.  Presented to PCP 07/2022 with few weeks history of digestive issues including gas, flatulence, bloating, GERD, and a knot in his stomach worse after larger meals.  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  He has not had staging workup.  An urgent referral to oncology was placed.   Socially, he [place] in [place], no children.  He is a retired Art gallery manager.  Independent with ADLs but his wife has been driving more in the past few weeks.  Prior to his illness he walked 2-1/2 miles per day.  Denies alcohol, tobacco, or other drug use.  Family history significant for mother with breast cancer in her 20s and a sister with nonmelanoma skin cancer.  Today he presents with his wife and brother.  For the past 2 months he has postprandial bloating and belching with abdominal pressure and some regurgitation. Lost 20-25 lbs in 2 months. Previously drinking 3 nutrition shakes per day but down to 1-2 bottles, 2 servings of ice cream, and may be a scrambled egg for the past few days.  He is not getting much down.  Occasionally dizzy, with dry mouth.  He has occasional ache in the left upper quadrant.  Bowel movements are difficult, stools are hard  but not bloody.    MEDICAL HISTORY:  Past Medical History:  Diagnosis Date   Anxiety    Benign prostatic hypertrophy    Diverticulosis of colon    GERD (gastroesophageal reflux disease)    Sullivan Lone syndrome    History of kidney stones    Hypertension    Microscopic hematuria    Motion sickness    ocean boats, curvy roads   Nephrolithiasis     X 2; Dr Aldean Ast   Sleep apnea    USES CPAP   Syncope 02/2007   NOCTURNAL POST MICTURTION   Urticaria     SURGICAL HISTORY: Past Surgical History:  Procedure Laterality Date   APPENDECTOMY     COLONOSCOPY  2010   NORMAL(pt states 2 other colonoscopies in the past)   COLONOSCOPY W/ POLYPECTOMY  2002   Tics 2005 & 2010; due 2020, Dr Jarold Motto   ESOPHAGOGASTRODUODENOSCOPY (EGD) WITH PROPOFOL N/A 09/30/2022   Procedure: ESOPHAGOGASTRODUODENOSCOPY (EGD) WITH BIOPSY;  Surgeon: Midge Minium, MD;  Location: [facility];  Service: Endoscopy;  Laterality: N/A;   LIVER BIOPSY     X 1 for elevated LFTs with NEGATIVE  hepatitis serologies   TONSILLECTOMY     TONSILLECTOMY     PT STATES HAS HAD IT TWICE   UPPER GASTROINTESTINAL ENDOSCOPY  1995    SOCIAL HISTORY: Social History   Socioeconomic History   Marital status: Married    Spouse name: Not on file   Number of children: 0   Years of education: Not on  file   Highest education level: Master's degree (e.g., MA, MS, MEng, MEd, MSW, MBA)  Occupational History   Not on file  Tobacco Use   Smoking status: Never   Smokeless tobacco: Never   Tobacco comments:    Second hand smoke  Vaping Use   Vaping status: Never Used  Substance and Sexual Activity   Alcohol use: No   Drug use: No   Sexual activity: Not on file  Other Topics Concern   Not on file  Social History Narrative   Right Handed   Lives in a two story home    Drinks some caffeine    Social Determinants of Health   Financial Resource Strain: Low Risk  (08/12/2022)   Overall Financial Resource Strain (CARDIA)     Difficulty of Paying Living Expenses: Not hard at all  Food Insecurity: No Food Insecurity (10/09/2022)   Hunger Vital Sign    Worried About Running Out of Food in the Last Year: Never true    Ran Out of Food in the Last Year: Never true  Transportation Needs: No Transportation Needs (10/09/2022)   PRAPARE - Administrator, Civil Service (Medical): No    Lack of Transportation (Non-Medical): No  Physical Activity: Sufficiently Active (08/12/2022)   Exercise Vital Sign    Days of Exercise per Week: 6 days    Minutes of Exercise per Session: 50 min  Stress: Stress Concern Present (08/12/2022)   [employer] of Occupational Health - Occupational Stress Questionnaire    Feeling of Stress : To some extent  Social Connections: Unknown (08/12/2022)   Social Connection and Isolation Panel [NHANES]    Frequency of Communication with Friends and Family: Once a week    Frequency of Social Gatherings with Friends and Family: Patient declined    Attends Religious Services: Never    Database administrator or Organizations: Not on file    Attends Banker Meetings: Not on file    Marital Status: Married  Intimate Partner Violence: Not At Risk (10/09/2022)   Humiliation, Afraid, Rape, and Kick questionnaire    Fear of Current or Ex-Partner: No    Emotionally Abused: No    Physically Abused: No    Sexually Abused: No    FAMILY HISTORY: Family History  Problem Relation Age of Onset   Breast cancer Mother    Rheumatologic disease Mother        RA   Heart disease Father        PCV;MI ? 30   Polycythemia Father    Leukemia Paternal Uncle    Polycythemia Paternal Uncle    Heart attack Paternal Grandfather        > 55   Stroke Maternal Grandmother        . 65   Sleep apnea Sister    Diabetes Neg Hx    Colon cancer Neg Hx    Colon polyps Neg Hx    Esophageal cancer Neg Hx    Stomach cancer Neg Hx    Rectal cancer Neg Hx     ALLERGIES:  is allergic to ramipril  and penicillins.  MEDICATIONS:  Current Outpatient Medications  Medication Sig Dispense Refill   FLUoxetine (PROZAC) 20 MG tablet Take 1 tablet (20 mg total) by mouth daily. 90 tablet 3   omeprazole (PRILOSEC) 20 MG capsule Take 1 capsule (20 mg total) by mouth daily. Take daily 30 minutes prior to a meal for 2 weeks then take as  needed for reflux 30 capsule 3   ondansetron (ZOFRAN) 4 MG tablet Take 1 tablet (4 mg total) by mouth every 6 (six) hours as needed for nausea or vomiting. 30 tablet 0   prochlorperazine (COMPAZINE) 10 MG tablet Take 1 tablet (10 mg total) by mouth every 6 (six) hours as needed for nausea or vomiting. 30 tablet 1   senna (SENOKOT) 8.6 MG TABS tablet Take 1 tablet (8.6 mg total) by mouth daily as needed for mild constipation. 30 tablet 0   valsartan (DIOVAN) 80 MG tablet Take 1 tablet (80 mg total) by mouth daily. 90 tablet 3   aspirin 81 MG tablet Take 81 mg by mouth daily. (Patient not taking: Reported on 10/09/2022)     Cholecalciferol (VITAMIN D) 2000 UNITS CAPS Take 2,000 Units by mouth daily. (Patient not taking: Reported on 10/09/2022)     Coenzyme Q10 (COQ10) 100 MG CAPS Take 1 tablet by mouth daily. (Patient not taking: Reported on 10/09/2022)     fish oil-omega-3 fatty acids 1000 MG capsule Take 1 g by mouth 2 (two) times daily. (Patient not taking: Reported on 10/09/2022)     OMEGA-3 KRILL OIL PO Take 1 capsule by mouth daily. (Patient not taking: Reported on 10/09/2022)     Current Facility-Administered Medications  Medication Dose Route Frequency Provider Last Rate Last Admin   0.9 %  sodium chloride infusion   Intravenous Once Pollyann Samples, NP        REVIEW OF SYSTEMS:   Constitutional: Denies fevers, chills or abnormal night sweats (+) 20 lbs weight loss in 2 months Eyes: Denies blurriness of vision, double vision or watery eyes Ears, nose, mouth, throat, and face: Denies mucositis or sore throat Respiratory: Denies cough, dyspnea or  wheezes Cardiovascular: Denies palpitation, chest discomfort or lower extremity swelling Gastrointestinal:  Denies nausea, vomiting (+) Constipation (+) GERD (+) regurgitation (+) bloating (+) LUQ pain Skin: Denies abnormal skin rashes Lymphatics: Denies new lymphadenopathy or easy bruising Neurological:Denies numbness, tingling or new weaknesses Behavioral/Psych: Mood is stable, no new changes (+) anxiety All other systems were reviewed with the patient and are negative.  PHYSICAL EXAMINATION: ECOG PERFORMANCE STATUS: 1-2  Vitals:   10/09/22 1222  BP: 112/89  Pulse: (!) 121  Resp: 17  Temp: 98.1 F (36.7 C)  SpO2: 98%   Filed Weights   10/09/22 1222  Weight: 160 lb 3.2 oz (72.7 kg)    GENERAL:alert, no distress and comfortable SKIN: Dry  skin EYES: sclera clear OROPHARYNX: Dry mucous membranes  NECK: Without mass LYMPH:  no palpable cervical or supraclavicular lymphadenopathy LUNGS: clear with normal breathing effort HEART: Tachycardic, regular rhythm, no lower extremity edema ABDOMEN:abdomen soft, non-tender and normal bowel sounds.  No palpable mass PSYCH: alert & oriented x 3 with fluent speech NEURO: no focal motor/sensory deficits  LABORATORY DATA:  I have reviewed the data as listed    Latest Ref Rng & Units 10/09/2022    1:43 PM 06/18/2022    9:23 AM 06/14/2021    9:36 AM  CBC  WBC 4.0 - 10.5 K/uL 7.5  4.3  4.5   Hemoglobin 13.0 - 17.0 g/dL 62.1  30.8  65.7   Hematocrit 39.0 - 52.0 % 45.8  47.2  47.1   Platelets 150 - 400 K/uL 263  222.0  200.0       Latest Ref Rng & Units 06/18/2022    9:23 AM 06/14/2021    9:36 AM 06/09/2020   10:04 AM  CMP  Glucose 70 - 99 mg/dL 87  87  86   BUN 6 - 23 mg/dL 15  17  18    Creatinine 0.40 - 1.50 mg/dL 1.61  0.96  0.45   Sodium 135 - 145 mEq/L 141  142  140   Potassium 3.5 - 5.1 mEq/L 4.2  4.7  4.3   Chloride 96 - 112 mEq/L 103  104  103   CO2 19 - 32 mEq/L 29  30  31    Calcium 8.4 - 10.5 mg/dL 9.5  9.6  9.4   Total  Protein 6.0 - 8.3 g/dL 6.9  7.0  7.0   Total Bilirubin 0.2 - 1.2 mg/dL 0.7  0.8  1.5   Alkaline Phos 39 - 117 U/L 76  74  68   AST 0 - 37 U/L 22  31  22    ALT 0 - 53 U/L 27  38  24      RADIOGRAPHIC STUDIES: I have personally reviewed the radiological images as listed and agreed with the findings in the report.  No results found.  ASSESSMENT & PLAN: 67 year old male  Poorly differentiated adenocarcinoma of the stomach, HER2 - -We reviewed his medical record in detail with the patient and family.  He presented with 2 months history of 20-25 pounds weight loss and dysphagia.  -He was found to have a large diffuse gastric mass, biopsy confirmed adenocarcinoma, grade 3, HER2 negative -We discussed the typically aggressive nature of gastric cancers.  Referring him for staging CT CAP.  -We are referring him to Dr. Donell Beers or Dr. Freida Busman for surgical consult, anticipate ex lap first, and given his poor nutrition he is a candidate for feeding tube placement which can be done at that time, as well as a port as we anticipate he will need chemotherapy -If CT is negative, we discussed neoadjuvant chemo.  He is otherwise healthy and active prior to symptom onset, he would likely tolerate chemo.  We discussed FLOT.  Agrees with port placement -Will check MMR to see if he is a candidate for immunotherapy - He understands if he is a surgical candidate the treatment goal of neoadjuvant chemo is curative -Will review his case in multidisciplinary tumor board and discuss with GI if he needs EUS -Patient and family had many questions, including palliative care program, quality of life, financial burden of cancer and treatment; I will refer him to social work -Mr. Backer appears dehydrated but stable for outpatient management.  Will check labs today and support with IV fluids -Plan to see him back to start treatment in 2 weeks  Weight loss, dysphagia, constipation, LUQ discomfort -Secondary to #1 -He is a  candidate for feeding tube placement and is agreeable -Reviewed symptom management for nausea, he can use Zofran and add Compazine as needed.  For constipation, I prescribed Senokot -Currently does not require medication for pain -Referral to dietitian placed; for now we encouraged to increase nutrition supplements, liquid diet, high calorie, etc  Social -Patient lives alone with his wife, has supportive family -His wife has not driven much in 10 years but is doing more now, may need transportation in the future -They have questions about the financial burden, and possibly needing home health in the future -I have referred him to social work and Teacher, music at her now  HTN, HL, pre-DM -Per PCP Dr. Cheryll Cockayne -On Prozac, Prilosec, and Diovan -Currently tolerating meds without difficulty   PLAN: -Medical record, including endoscopy and path reviewed -Lab  and supportive care IVF today -Will check MMR -Urgent referral to Dr. Donell Beers or Dr. Freida Busman, for feeding tube and port placement -CT CAP, possible EUS -Review case in tumor board  -F/up to start treatment in ~2 weeks -Urgent referrals to SW, dietician    Orders Placed This Encounter  Procedures   CT CHEST ABDOMEN PELVIS W CONTRAST    Standing Status:   Future    Standing Expiration Date:   10/09/2023    Order Specific Question:   If indicated for the ordered procedure, I authorize the administration of contrast media per Radiology protocol    Answer:   Yes    Order Specific Question:   Does the patient have a contrast media/X-ray dye allergy?    Answer:   No    Order Specific Question:   Preferred imaging location?    Answer:   [facility]    Order Specific Question:   If indicated for the ordered procedure, I authorize the administration of oral contrast media per Radiology protocol    Answer:   Yes   CBC with Differential (Cancer Center Only)    Standing Status:   Future    Number of Occurrences:   1    Standing  Expiration Date:   10/09/2023   CMP (Cancer Center only)    Standing Status:   Future    Number of Occurrences:   1    Standing Expiration Date:   10/09/2023   CEA (Access)-[facility] ONLY    Standing Status:   Future    Number of Occurrences:   1    Standing Expiration Date:   10/09/2023   Ferritin    Standing Status:   Future    Number of Occurrences:   1    Standing Expiration Date:   10/09/2023   Iron and Iron Binding Capacity ([facility] only)    Standing Status:   Future    Number of Occurrences:   1    Standing Expiration Date:   10/09/2023   Ambulatory Referral to [facility] Nutrition    Referral Priority:   Urgent    Referral Type:   Consultation    Referral Reason:   Specialty Services Required    Number of Visits Requested:   1   Ambulatory referral to Social Work    Referral Priority:   Urgent    Referral Type:   Consultation    Referral Reason:   Specialty Services Required    Number of Visits Requested:   1    All questions were answered. The patient knows to call the clinic with any problems, questions or concerns.      Pollyann Samples, NP 10/09/2022   Addendum I have seen the patient, examined him. I agree with the assessment and and plan and have edited the notes.   67 yo male with past medical history of hypertension, dyslipidemia, prediabetes, OSA, presented with worsening upper abdominal discomfort, especially after meals, with bloating and gassy feeling.  He has lost 20-25 pounds in the past few months.  EGD impression showed a large gastric mass, biopsy confirmed adenocarcinoma.  I reviewed his endoscopy findings and biopsy results with patient and his wife in detail.  I recommend staging CT scan to be done as possible, if no distant metastasis or regional adenopathy, he will need a EUS for staging also.  Due to his significant nausea, vomiting secondary to the gastric cancer, he would not need a feeding J-tube for nutrition.  He likely has at least locally advanced disease.   I discussed the benefit of neoadjuvant chemotherapy FLOT , followed by surgery and adjuvant chemo.  He is interested in treatment.  Will reach out to [facility] Dr. Meridee Score for EUS staging, and make an urgent referral to Dr. Donell Beers or Dr. Freida Busman for surgical consult and J-tube placement. Will do lab and give IVF today. Will request MMR on his biopsy. All questions were answered.   [name] MD 10/09/2022

## 2022-10-09 NOTE — Patient Instructions (Signed)

## 2022-10-10 ENCOUNTER — Other Ambulatory Visit: Payer: Self-pay

## 2022-10-10 ENCOUNTER — Telehealth: Payer: Self-pay

## 2022-10-10 ENCOUNTER — Telehealth: Payer: Self-pay | Admitting: Nurse Practitioner

## 2022-10-10 ENCOUNTER — Inpatient Hospital Stay: Payer: Medicare PPO | Admitting: Licensed Clinical Social Worker

## 2022-10-10 DIAGNOSIS — C169 Malignant neoplasm of stomach, unspecified: Secondary | ICD-10-CM

## 2022-10-10 NOTE — Telephone Encounter (Signed)
EUS has been scheduled for 7/24 at 830 am at [facility] with GM

## 2022-10-10 NOTE — Progress Notes (Signed)
Spoke with Jeanice Lim - Dr. Arita Miss nurse at [facility] [phone]) regarding placement of pt's feeding tube.  Requested if Dr. Donell Beers would please contact Dr. Latanya Maudlin office regarding the staff message sent to Dr. Donell Beers from Santiago Glad, NP.  Holly stated Dr. Donell Beers is currently in surgery but will forward the message to Dr. Donell Beers.

## 2022-10-10 NOTE — Progress Notes (Signed)
[facility] Clinical Social Work  Initial Assessment   Steven Wolf is a 67 y.o. year old male accompanied by patient and wife. Clinical Social Work was referred by medical provider for assessment of psychosocial needs.   SDOH (Social Determinants of Health) assessments performed: Yes SDOH Interventions    Flowsheet Row Office Visit from 06/18/2022 in [facility] Visit from 06/09/2020 in [facility] [place]  SDOH Interventions    Depression Interventions/Treatment  Currently on Treatment Currently on Treatment       SDOH Screenings   Food Insecurity: No Food Insecurity (10/09/2022)  Housing: Low Risk  (10/09/2022)  Transportation Needs: No Transportation Needs (10/09/2022)  Utilities: Not At Risk (10/09/2022)  Depression (PHQ2-9): Medium Risk (08/13/2022)  Financial Resource Strain: Low Risk  (08/12/2022)  Physical Activity: Sufficiently Active (08/12/2022)  Social Connections: Unknown (08/12/2022)  Stress: Stress Concern Present (08/12/2022)  Tobacco Use: Low Risk  (10/09/2022)     Distress Screen completed: No     No data to display            Family/Social Information:  Housing Arrangement: patient lives with his wife. Family members/support persons in your life? Pt and wife do not have children.  The couple has family who reside in both [place] and [place], but no one who is close by.  Pt's brother accompanied pt to appointment and will provide some limited support. Transportation concerns: Pt's wife can provide transportation; however, she does have difficulty seeing after dark.  If pt's appointments run late when it begins to get dark earlier pt may need to utilize transportation services.  Employment: Retired pt was an Art gallery manager prior to retirement.  Income source: Actor concerns: No Type of concern: None Food access concerns: no Religious or spiritual practice: Not  known Services Currently in place:  none  Coping/ Adjustment to diagnosis: Patient understands treatment plan and what happens next? yes Concerns about diagnosis and/or treatment: Overwhelmed by information, Afraid of cancer, and Quality of life Patient reported stressors: Depression, Anxiety/ nervousness, Adjusting to my illness, and Facing my mortality Hopes and/or priorities: Pt's priority is to start treatment w/ the hope of positive results. Patient enjoys  not addressed Current coping skills/ strengths: Ability for insight , Average or above average intelligence , Capable of independent living , Communication skills , Financial means , General fund of knowledge , Motivation for treatment/growth , and Supportive family/friends     SUMMARY: Current SDOH Barriers:  No barriers identified at this time.  Clinical Social Work Clinical Goal(s):  No clinical social work goals at this time  Interventions: Discussed common feeling and emotions when being diagnosed with cancer, and the importance of support during treatment Informed patient of the support team roles and support services at [facility] Provided CSW contact information and encouraged patient to call with any questions or concerns Provided education regarding mentors and will refer to [employer] if pt requests additional support.   Discussed the caregiver support group for pt's spouse.  Pt downloaded and completed adv directives and had them notarized.  CSW obtained a copy which will be scanned into the chart.  Pt's wife is designated as his HCP Steven Wolf).   Follow Up Plan: Patient will contact CSW with any support or resource needs Patient verbalizes understanding of plan: Yes    Rachel Moulds, LCSW Clinical Social Worker [facility]

## 2022-10-10 NOTE — Telephone Encounter (Signed)
-----   Message from [facility] sent at 10/10/2022 11:30 AM EDT ----- Regarding: RE: Thanks team.  Alexia Freestone, Offer this patient my open 7/24 slot for EUS staging gastric cancer. Thanks. GM ----- Message ----- From: Arcelia Jew, RN Sent: 10/10/2022  10:03 AM EDT To: Almond Lint, MD; Carlean Jews, NP; # Subject: RE:                                            Pt's CT Scan will be done tomorrow 10/11/2022 @11 :30am ----- Message ----- From: [name], MD Sent: 10/09/2022   5:23 PM EDT To: Almond Lint, MD; Carlean Jews, NP; # Subject: RE:                                            Clydie Braun is out of office now  Dyanne Carrel, please call radiation tomorrow morning and move his CT scan up?  Terrace Arabia ----- Message ----- From: Lemar Lofty., MD Sent: 10/09/2022   5:00 PM EDT To: Almond Lint, MD; Carlean Jews, NP; # Subject: RE:                                            Team, I have had a cancellation for next week on 7/24. But I see the CT-CAP is not until the 27th. Anyway we could get this done before so I can try and get him in next week, otherwise, will be a few weeks out for EUS attempt. I'll hold this for another day before I give up that slot. GM ----- Message ----- From: Pollyann Samples, NP Sent: 10/09/2022   2:59 PM EDT To: Almond Lint, MD; Carlean Jews, NP; # Subject: RE:                                            Yep exactly ----- Message ----- From: Almond Lint, MD Sent: 10/09/2022   2:44 PM EDT To: Carlean Jews, NP; Fritzi Mandes, MD; #  Clayborn Heron,  So you are thinking dx laparoscopy, j tube, port? If no metastatic dx on staging, will need EUS. Fb ----- Message ----- From: Pollyann Samples, NP Sent: 10/09/2022   2:14 PM EDT To: Almond Lint, MD; Carlean Jews, NP; #  Hi team,  This is a new patient with gastric cancer in what appears to be the entire stomach per endoscopy report, and aggressive given significant  dysphagia and 25 lbs weight loss in 2 months. He is appropriate/agreeable with feeding tube placement. Dr. Donell Beers, could you please help to get him in soon. We also discussed chemo and he agrees to port placement. He understands you may do exploratory procedure at same time.  Clydie Braun, could you please put him on conference list next week, full discussion. He understands may need EUS, but not sure yet, will see what scan shows. Please help with CT scheduling, request MMR, and chemo class (likely FLOT).  Thank you all Lacie

## 2022-10-11 ENCOUNTER — Other Ambulatory Visit: Payer: Self-pay

## 2022-10-11 ENCOUNTER — Ambulatory Visit (HOSPITAL_COMMUNITY)
Admission: RE | Admit: 2022-10-11 | Discharge: 2022-10-11 | Disposition: A | Discharge status: Home / Self Care | Payer: Medicare PPO | Source: Ambulatory Visit | Attending: Nurse Practitioner | Admitting: Nurse Practitioner

## 2022-10-11 ENCOUNTER — Inpatient Hospital Stay: Payer: Medicare PPO

## 2022-10-11 ENCOUNTER — Inpatient Hospital Stay: Payer: Medicare PPO | Admitting: Nutrition

## 2022-10-11 ENCOUNTER — Encounter: Payer: Self-pay | Admitting: Hematology

## 2022-10-11 VITALS — BP 127/84 | HR 88 | Resp 18

## 2022-10-11 DIAGNOSIS — Z79899 Other long term (current) drug therapy: Secondary | ICD-10-CM | POA: Diagnosis not present

## 2022-10-11 DIAGNOSIS — E222 Syndrome of inappropriate secretion of antidiuretic hormone: Secondary | ICD-10-CM

## 2022-10-11 DIAGNOSIS — K769 Liver disease, unspecified: Secondary | ICD-10-CM | POA: Diagnosis not present

## 2022-10-11 DIAGNOSIS — R1310 Dysphagia, unspecified: Secondary | ICD-10-CM | POA: Diagnosis not present

## 2022-10-11 DIAGNOSIS — C169 Malignant neoplasm of stomach, unspecified: Secondary | ICD-10-CM | POA: Insufficient documentation

## 2022-10-11 DIAGNOSIS — K5900 Constipation, unspecified: Secondary | ICD-10-CM | POA: Diagnosis not present

## 2022-10-11 DIAGNOSIS — E860 Dehydration: Secondary | ICD-10-CM | POA: Diagnosis not present

## 2022-10-11 DIAGNOSIS — N289 Disorder of kidney and ureter, unspecified: Secondary | ICD-10-CM | POA: Diagnosis not present

## 2022-10-11 DIAGNOSIS — I700 Atherosclerosis of aorta: Secondary | ICD-10-CM | POA: Diagnosis not present

## 2022-10-11 DIAGNOSIS — R634 Abnormal weight loss: Secondary | ICD-10-CM | POA: Diagnosis not present

## 2022-10-11 MED ORDER — IOHEXOL 300 MG/ML  SOLN
100.0000 mL | Freq: Once | INTRAMUSCULAR | Status: AC | PRN
  Administered 2022-10-11: 100 mL via INTRAVENOUS

## 2022-10-11 MED ORDER — SODIUM CHLORIDE 0.9 % IV SOLN: INTRAVENOUS | Status: DC

## 2022-10-11 NOTE — Addendum Note (Signed)
Addended by: [name] on: 10/11/2022 12:58 PM   Modules accepted: Orders

## 2022-10-11 NOTE — Patient Instructions (Signed)

## 2022-10-11 NOTE — Telephone Encounter (Signed)
Left message on machine to call back  

## 2022-10-11 NOTE — Progress Notes (Signed)
67 year old male diagnosed with gastric cancer and followed by Dr. Mosetta Putt.  PMH includes anxiety, diverticulosis, GERD, Gilbert Syndrome, and HTN.  Medications include Prozac, Prilosec, Zofran, Compazine, Senokot, Vit D, CoQ 10, Omega 3 fatty acids.  Labs include Glucose 105 and BUN 27  Height: 6'0". Weight: 160 pounds 3 oz. UBW: 188 pounds in April 2024. BMI: 21.72. ECOG: 1-2  Estimated Nutrition Needs: 2400-2600 calories, 110 - 130 gm pro, >2.5 L fluid  Met with patient and wife during IVF at charge nurse request. Patient is newly diagnosed and treatment plan is pending. He denies difficulty swallowing but reports early satiety and reflux. States water is very difficult to consume and he only tolerates about 8 oz daily. He was drinking 3 Fairlife Nutrition Plan shakes initially but now can only get one down daily. He has been able to eat small amounts of ice cream and one scrambled egg but not much else. Endorses ~25 pound weight loss. This equals a 15% weight loss in less than 3 months. He will receive fluids tomorrow and again on Monday with hopes to be admitted soon for jejunostomy feeding tube placement.  Nutrition Diagnosis: Unintended wt loss related to cancer as evidenced by 15% wt loss in less than 3 months.  Intervention: Educated on choosing calorie dense foods that are easy to digest. Consume something with calories and protein every 2 hours as tolerated. Provided nutrition fact sheets and contact information. Change Fairlife nutrition plan to Ensure plus or equivalent. Try to drink 3 daily. Educated on basic jejunostomy placement and feedings. Will alert inpatient RDs regarding potential admission.  Monitoring, Evaluation, Goals: Patient will tolerate increased calories and protein to minimize further weight loss.  Next Visit: To be scheduled.

## 2022-10-11 NOTE — Telephone Encounter (Signed)
Inbound call from patient returning phone call. Requesting a call back. Please advise, thank you.  

## 2022-10-11 NOTE — Telephone Encounter (Signed)
EUS scheduled, pt instructed and medications reviewed.  Patient instructions mailed to home and sent to My Chart .  Patient to call with any questions or concerns.  

## 2022-10-12 ENCOUNTER — Other Ambulatory Visit: Payer: Self-pay

## 2022-10-12 ENCOUNTER — Inpatient Hospital Stay: Payer: Medicare PPO

## 2022-10-12 VITALS — BP 107/79 | HR 108 | Temp 97.5°F | Resp 17

## 2022-10-12 DIAGNOSIS — K2100 Gastro-esophageal reflux disease with esophagitis, without bleeding: Secondary | ICD-10-CM | POA: Diagnosis present

## 2022-10-12 DIAGNOSIS — G893 Neoplasm related pain (acute) (chronic): Secondary | ICD-10-CM | POA: Diagnosis not present

## 2022-10-12 DIAGNOSIS — R112 Nausea with vomiting, unspecified: Secondary | ICD-10-CM | POA: Diagnosis not present

## 2022-10-12 DIAGNOSIS — Z7189 Other specified counseling: Secondary | ICD-10-CM | POA: Diagnosis not present

## 2022-10-12 DIAGNOSIS — F419 Anxiety disorder, unspecified: Secondary | ICD-10-CM | POA: Diagnosis present

## 2022-10-12 DIAGNOSIS — Z8249 Family history of ischemic heart disease and other diseases of the circulatory system: Secondary | ICD-10-CM | POA: Diagnosis not present

## 2022-10-12 DIAGNOSIS — E785 Hyperlipidemia, unspecified: Secondary | ICD-10-CM | POA: Diagnosis not present

## 2022-10-12 DIAGNOSIS — K828 Other specified diseases of gallbladder: Secondary | ICD-10-CM | POA: Diagnosis not present

## 2022-10-12 DIAGNOSIS — Z888 Allergy status to other drugs, medicaments and biological substances status: Secondary | ICD-10-CM | POA: Diagnosis not present

## 2022-10-12 DIAGNOSIS — I10 Essential (primary) hypertension: Secondary | ICD-10-CM | POA: Diagnosis not present

## 2022-10-12 DIAGNOSIS — K567 Ileus, unspecified: Secondary | ICD-10-CM | POA: Diagnosis not present

## 2022-10-12 DIAGNOSIS — K311 Adult hypertrophic pyloric stenosis: Secondary | ICD-10-CM | POA: Diagnosis not present

## 2022-10-12 DIAGNOSIS — Z6823 Body mass index (BMI) 23.0-23.9, adult: Secondary | ICD-10-CM | POA: Diagnosis not present

## 2022-10-12 DIAGNOSIS — R627 Adult failure to thrive: Secondary | ICD-10-CM | POA: Diagnosis present

## 2022-10-12 DIAGNOSIS — K56609 Unspecified intestinal obstruction, unspecified as to partial versus complete obstruction: Secondary | ICD-10-CM | POA: Diagnosis not present

## 2022-10-12 DIAGNOSIS — C482 Malignant neoplasm of peritoneum, unspecified: Secondary | ICD-10-CM | POA: Diagnosis not present

## 2022-10-12 DIAGNOSIS — Z515 Encounter for palliative care: Secondary | ICD-10-CM | POA: Diagnosis not present

## 2022-10-12 DIAGNOSIS — C786 Secondary malignant neoplasm of retroperitoneum and peritoneum: Secondary | ICD-10-CM | POA: Diagnosis present

## 2022-10-12 DIAGNOSIS — K312 Hourglass stricture and stenosis of stomach: Secondary | ICD-10-CM | POA: Diagnosis not present

## 2022-10-12 DIAGNOSIS — K449 Diaphragmatic hernia without obstruction or gangrene: Secondary | ICD-10-CM | POA: Diagnosis present

## 2022-10-12 DIAGNOSIS — Z880 Allergy status to penicillin: Secondary | ICD-10-CM | POA: Diagnosis not present

## 2022-10-12 DIAGNOSIS — C169 Malignant neoplasm of stomach, unspecified: Secondary | ICD-10-CM

## 2022-10-12 DIAGNOSIS — F32A Depression, unspecified: Secondary | ICD-10-CM | POA: Diagnosis present

## 2022-10-12 DIAGNOSIS — K222 Esophageal obstruction: Secondary | ICD-10-CM | POA: Diagnosis not present

## 2022-10-12 DIAGNOSIS — G4730 Sleep apnea, unspecified: Secondary | ICD-10-CM | POA: Diagnosis present

## 2022-10-12 DIAGNOSIS — N400 Enlarged prostate without lower urinary tract symptoms: Secondary | ICD-10-CM | POA: Diagnosis present

## 2022-10-12 DIAGNOSIS — Z452 Encounter for adjustment and management of vascular access device: Secondary | ICD-10-CM | POA: Diagnosis not present

## 2022-10-12 DIAGNOSIS — Z79899 Other long term (current) drug therapy: Secondary | ICD-10-CM | POA: Diagnosis not present

## 2022-10-12 DIAGNOSIS — K632 Fistula of intestine: Secondary | ICD-10-CM | POA: Diagnosis not present

## 2022-10-12 DIAGNOSIS — R4589 Other symptoms and signs involving emotional state: Secondary | ICD-10-CM | POA: Diagnosis not present

## 2022-10-12 DIAGNOSIS — C168 Malignant neoplasm of overlapping sites of stomach: Secondary | ICD-10-CM | POA: Diagnosis not present

## 2022-10-12 DIAGNOSIS — E876 Hypokalemia: Secondary | ICD-10-CM | POA: Diagnosis present

## 2022-10-12 DIAGNOSIS — K7689 Other specified diseases of liver: Secondary | ICD-10-CM | POA: Diagnosis not present

## 2022-10-12 DIAGNOSIS — R590 Localized enlarged lymph nodes: Secondary | ICD-10-CM | POA: Diagnosis not present

## 2022-10-12 DIAGNOSIS — R918 Other nonspecific abnormal finding of lung field: Secondary | ICD-10-CM | POA: Diagnosis not present

## 2022-10-12 DIAGNOSIS — Z434 Encounter for attention to other artificial openings of digestive tract: Secondary | ICD-10-CM | POA: Diagnosis not present

## 2022-10-12 DIAGNOSIS — K5730 Diverticulosis of large intestine without perforation or abscess without bleeding: Secondary | ICD-10-CM | POA: Diagnosis not present

## 2022-10-12 DIAGNOSIS — J982 Interstitial emphysema: Secondary | ICD-10-CM | POA: Diagnosis not present

## 2022-10-12 DIAGNOSIS — R188 Other ascites: Secondary | ICD-10-CM | POA: Diagnosis present

## 2022-10-12 DIAGNOSIS — G4733 Obstructive sleep apnea (adult) (pediatric): Secondary | ICD-10-CM | POA: Diagnosis not present

## 2022-10-12 DIAGNOSIS — N281 Cyst of kidney, acquired: Secondary | ICD-10-CM | POA: Diagnosis not present

## 2022-10-12 DIAGNOSIS — R932 Abnormal findings on diagnostic imaging of liver and biliary tract: Secondary | ICD-10-CM | POA: Diagnosis not present

## 2022-10-12 DIAGNOSIS — K5900 Constipation, unspecified: Secondary | ICD-10-CM | POA: Diagnosis not present

## 2022-10-12 DIAGNOSIS — E43 Unspecified severe protein-calorie malnutrition: Secondary | ICD-10-CM | POA: Diagnosis not present

## 2022-10-12 DIAGNOSIS — C163 Malignant neoplasm of pyloric antrum: Secondary | ICD-10-CM | POA: Diagnosis present

## 2022-10-12 MED ORDER — SODIUM CHLORIDE 0.9 % IV SOLN: Freq: Once | INTRAVENOUS | Status: AC

## 2022-10-12 NOTE — Patient Instructions (Signed)

## 2022-10-14 ENCOUNTER — Inpatient Hospital Stay: Payer: Medicare PPO | Admitting: Hematology

## 2022-10-14 ENCOUNTER — Encounter (HOSPITAL_COMMUNITY): Payer: Self-pay

## 2022-10-14 ENCOUNTER — Inpatient Hospital Stay: Payer: Medicare PPO

## 2022-10-14 ENCOUNTER — Other Ambulatory Visit: Payer: Self-pay

## 2022-10-14 ENCOUNTER — Inpatient Hospital Stay (HOSPITAL_COMMUNITY)
Admission: AD | Admit: 2022-10-14 | Discharge: 2022-11-03 | DRG: 356 | Disposition: A | Discharge status: Home / Self Care | Payer: Medicare PPO | Source: Ambulatory Visit | Attending: Internal Medicine | Admitting: Internal Medicine

## 2022-10-14 ENCOUNTER — Encounter (HOSPITAL_COMMUNITY): Payer: Self-pay | Admitting: Internal Medicine

## 2022-10-14 VITALS — BP 118/91 | HR 114 | Temp 98.0°F | Resp 17 | Wt 159.3 lb

## 2022-10-14 VITALS — BP 108/80 | HR 101 | Temp 98.0°F | Resp 20

## 2022-10-14 DIAGNOSIS — I10 Essential (primary) hypertension: Secondary | ICD-10-CM | POA: Diagnosis present

## 2022-10-14 DIAGNOSIS — C169 Malignant neoplasm of stomach, unspecified: Secondary | ICD-10-CM | POA: Diagnosis present

## 2022-10-14 DIAGNOSIS — E43 Unspecified severe protein-calorie malnutrition: Principal | ICD-10-CM | POA: Insufficient documentation

## 2022-10-14 DIAGNOSIS — G893 Neoplasm related pain (acute) (chronic): Secondary | ICD-10-CM

## 2022-10-14 DIAGNOSIS — K311 Adult hypertrophic pyloric stenosis: Secondary | ICD-10-CM | POA: Diagnosis present

## 2022-10-14 DIAGNOSIS — Z515 Encounter for palliative care: Secondary | ICD-10-CM

## 2022-10-14 DIAGNOSIS — Z79899 Other long term (current) drug therapy: Secondary | ICD-10-CM

## 2022-10-14 DIAGNOSIS — Z7189 Other specified counseling: Secondary | ICD-10-CM

## 2022-10-14 DIAGNOSIS — N400 Enlarged prostate without lower urinary tract symptoms: Secondary | ICD-10-CM | POA: Diagnosis present

## 2022-10-14 DIAGNOSIS — R4589 Other symptoms and signs involving emotional state: Secondary | ICD-10-CM

## 2022-10-14 DIAGNOSIS — F419 Anxiety disorder, unspecified: Secondary | ICD-10-CM | POA: Diagnosis present

## 2022-10-14 DIAGNOSIS — K5900 Constipation, unspecified: Secondary | ICD-10-CM

## 2022-10-14 DIAGNOSIS — R112 Nausea with vomiting, unspecified: Secondary | ICD-10-CM

## 2022-10-14 LAB — CBC WITH DIFFERENTIAL (CANCER CENTER ONLY)
Abs Immature Granulocytes: 0.02 10*3/uL (ref 0.00–0.07)
Basophils Absolute: 0.1 10*3/uL (ref 0.0–0.1)
Basophils Relative: 1 %
Eosinophils Absolute: 0.1 10*3/uL (ref 0.0–0.5)
Eosinophils Relative: 2 %
HCT: 42.1 % (ref 39.0–52.0)
Hemoglobin: 14.7 g/dL (ref 13.0–17.0)
Immature Granulocytes: 0 %
Lymphocytes Relative: 18 %
Lymphs Abs: 1.1 10*3/uL (ref 0.7–4.0)
MCH: 30.5 pg (ref 26.0–34.0)
MCHC: 34.9 g/dL (ref 30.0–36.0)
MCV: 87.3 fL (ref 80.0–100.0)
Monocytes Absolute: 0.6 10*3/uL (ref 0.1–1.0)
Monocytes Relative: 9 %
Neutro Abs: 4.4 10*3/uL (ref 1.7–7.7)
Neutrophils Relative %: 70 %
Platelet Count: 205 10*3/uL (ref 150–400)
RBC: 4.82 MIL/uL (ref 4.22–5.81)
RDW: 12.6 % (ref 11.5–15.5)
WBC Count: 6.3 10*3/uL (ref 4.0–10.5)
nRBC: 0 % (ref 0.0–0.2)

## 2022-10-14 LAB — CMP (CANCER CENTER ONLY)
ALT: 28 U/L (ref 0–44)
AST: 17 U/L (ref 15–41)
Albumin: 4.1 g/dL (ref 3.5–5.0)
Alkaline Phosphatase: 56 U/L (ref 38–126)
Anion gap: 11 (ref 5–15)
BUN: 20 mg/dL (ref 8–23)
CO2: 25 mmol/L (ref 22–32)
Calcium: 9.8 mg/dL (ref 8.9–10.3)
Chloride: 102 mmol/L (ref 98–111)
Creatinine: 1.01 mg/dL (ref 0.61–1.24)
GFR, Estimated: >60 mL/min (ref >60)
Glucose, Bld: 75 mg/dL (ref 70–99)
Potassium: 4.1 mmol/L (ref 3.5–5.1)
Sodium: 138 mmol/L (ref 135–145)
Total Bilirubin: 0.6 mg/dL (ref 0.3–1.2)
Total Protein: 6.5 g/dL (ref 6.5–8.1)

## 2022-10-14 MED ORDER — TRAZODONE HCL 50 MG PO TABS
25.0000 mg | ORAL_TABLET | Freq: Every evening | ORAL | Status: DC | PRN
  Administered 2022-10-18: 25 mg via ORAL
  Filled 2022-10-14: qty 1

## 2022-10-14 MED ORDER — IRBESARTAN 75 MG PO TABS
75.0000 mg | ORAL_TABLET | Freq: Every day | ORAL | Status: DC
  Administered 2022-10-14 – 2022-10-23 (×10): 75 mg via ORAL
  Filled 2022-10-14 (×10): qty 1

## 2022-10-14 MED ORDER — ENOXAPARIN SODIUM 40 MG/0.4ML IJ SOSY: 40.0000 mg | PREFILLED_SYRINGE | INTRAMUSCULAR | Status: AC

## 2022-10-14 MED ORDER — ONDANSETRON HCL 4 MG/2ML IJ SOLN
8.0000 mg | Freq: Once | INTRAMUSCULAR | Status: DC
  Filled 2022-10-14: qty 4

## 2022-10-14 MED ORDER — SODIUM CHLORIDE 0.9 % IV SOLN: Freq: Once | INTRAVENOUS | Status: AC

## 2022-10-14 MED ORDER — PANTOPRAZOLE SODIUM 40 MG PO TBEC
40.0000 mg | DELAYED_RELEASE_TABLET | Freq: Every day | ORAL | Status: DC
  Administered 2022-10-15: 40 mg via ORAL
  Filled 2022-10-14: qty 1

## 2022-10-14 MED ORDER — ACETAMINOPHEN 650 MG RE SUPP: 650.0000 mg | Freq: Four times a day (QID) | RECTAL | Status: DC | PRN

## 2022-10-14 MED ORDER — FLUOXETINE HCL 20 MG PO CAPS
20.0000 mg | ORAL_CAPSULE | Freq: Every day | ORAL | Status: DC
  Administered 2022-10-15 – 2022-10-25 (×10): 20 mg via ORAL
  Filled 2022-10-14 (×11): qty 1

## 2022-10-14 MED ORDER — SODIUM CHLORIDE 0.9 % IV SOLN: INTRAVENOUS | Status: DC | PRN

## 2022-10-14 MED ORDER — ALBUTEROL SULFATE (2.5 MG/3ML) 0.083% IN NEBU: 2.5000 mg | INHALATION_SOLUTION | RESPIRATORY_TRACT | Status: DC | PRN

## 2022-10-14 MED ORDER — ACETAMINOPHEN 325 MG PO TABS: 650.0000 mg | ORAL_TABLET | Freq: Four times a day (QID) | ORAL | Status: DC | PRN

## 2022-10-14 NOTE — H&P (Addendum)
History and Physical  AKING KLABUNDE XLK:[id number] DOB: Mar 03, 1956 DOA: 10/14/2022  PCP: Pincus Sanes, MD   Chief Complaint: Trouble eating  HPI: Steven Wolf is a 67 y.o. male with medical history as noted below who had recent diagnosis of gastric adenocarcinoma is being direct admitted from oncology clinic today in order to expedite placement of feeding tube, and staging EUS.  Due to gastric symptoms, he had recent EGD showing a large infiltrative circumferential mass with bleeding, biopsy showed poorly differentiated adenocarcinoma.  Staging workup is still pending.  He has had worsening difficulty with p.o. intake, with significant bloating and severe reflux symptoms after meals.  He has also had associated weight loss of about 25 pounds in the last couple of months.  Lately he is only able to drink about 8 ounces of water per day, and may be a small shake or 2.  Due to his progressive weight loss and inability to tolerate p.o. intake, he was admitted to the hospital today for expeditious feeding tube placement, and EUS with gastroenterology.  Review of Systems: Please see HPI for pertinent positives and negatives. A complete 10 system review of systems are otherwise negative.  Past Medical History:  Diagnosis Date   Anxiety    Benign prostatic hypertrophy    Diverticulosis of colon    GERD (gastroesophageal reflux disease)    Sullivan Lone syndrome    History of kidney stones    Hypertension    Microscopic hematuria    Motion sickness    ocean boats, curvy roads   Nephrolithiasis     X 2; Dr Aldean Ast   Sleep apnea    USES CPAP   Syncope 02/2007   NOCTURNAL POST MICTURTION   Urticaria    Past Surgical History:  Procedure Laterality Date   APPENDECTOMY     COLONOSCOPY  2010   NORMAL(pt states 2 other colonoscopies in the past)   COLONOSCOPY W/ POLYPECTOMY  2002   Tics 2005 & 2010; due 2020, Dr Jarold Motto   ESOPHAGOGASTRODUODENOSCOPY (EGD) WITH PROPOFOL N/A 09/30/2022    Procedure: ESOPHAGOGASTRODUODENOSCOPY (EGD) WITH BIOPSY;  Surgeon: Midge Minium, MD;  Location: [facility];  Service: Endoscopy;  Laterality: N/A;   LIVER BIOPSY     X 1 for elevated LFTs with NEGATIVE  hepatitis serologies   TONSILLECTOMY     TONSILLECTOMY     PT STATES HAS HAD IT TWICE   UPPER GASTROINTESTINAL ENDOSCOPY  1995    Social History:  reports that he has never smoked. He has never used smokeless tobacco. He reports that he does not drink alcohol and does not use drugs.   Allergies  Allergen Reactions   Ramipril Other (See Comments)    REACTION: elevated liver enzymes   Penicillins Other (See Comments)    ? Reaction (as child)    Family History  Problem Relation Age of Onset   Breast cancer Mother    Rheumatologic disease Mother        RA   Heart disease Father        PCV;MI ? 96   Polycythemia Father    Leukemia Paternal Uncle    Polycythemia Paternal Uncle    Heart attack Paternal Grandfather        > 39   Stroke Maternal Grandmother        . 65   Sleep apnea Sister    Diabetes Neg Hx    Colon cancer Neg Hx    Colon polyps Neg Hx  Esophageal cancer Neg Hx    Stomach cancer Neg Hx    Rectal cancer Neg Hx      Prior to Admission medications   Medication Sig Start Date End Date Taking? Authorizing Provider  FLUoxetine (PROZAC) 20 MG tablet Take 1 tablet (20 mg total) by mouth daily. 06/18/22   Pincus Sanes, MD  omeprazole (PRILOSEC) 20 MG capsule Take 1 capsule (20 mg total) by mouth daily. Take daily 30 minutes prior to a meal for 2 weeks then take as needed for reflux 08/13/22   Pincus Sanes, MD  ondansetron (ZOFRAN) 4 MG tablet Take 1 tablet (4 mg total) by mouth every 6 (six) hours as needed for nausea or vomiting. 09/25/22   Celso Amy, PA-C  prochlorperazine (COMPAZINE) 10 MG tablet Take 1 tablet (10 mg total) by mouth every 6 (six) hours as needed for nausea or vomiting. 10/09/22   [name], MD  senna (SENOKOT) 8.6 MG TABS tablet Take 1  tablet (8.6 mg total) by mouth daily as needed for mild constipation. 10/09/22   Pollyann Samples, NP  valsartan (DIOVAN) 80 MG tablet Take 1 tablet (80 mg total) by mouth daily. 06/18/22   Pincus Sanes, MD    Physical Exam: BP (!) 120/91 (BP Location: Right Arm)   Pulse 92   Temp 97.6 F (36.4 C) (Oral)   Resp 18   Ht 6' (1.829 m)   Wt 72.3 kg   SpO2 99%   BMI 21.62 kg/m   General:  Alert, oriented, calm, in no acute distress, wife is at the bedside Eyes: EOMI, clear conjuctivae, white sclerea Neck: supple, no masses, trachea mildline  Cardiovascular: RRR, no murmurs or rubs, no peripheral edema  Respiratory: clear to auscultation bilaterally, no wheezes, no crackles  Abdomen: soft, nontender, nondistended, normal bowel tones heard  Skin: dry, no rashes  Musculoskeletal: no joint effusions, normal range of motion  Psychiatric: appropriate affect, normal speech  Neurologic: extraocular muscles intact, clear speech, moving all extremities with intact sensorium          Labs on Admission:  Basic Metabolic Panel: Recent Labs  Lab 10/09/22 1343 10/14/22 1320  NA 137 138  K 3.9 4.1  CL 100 102  CO2 27 25  GLUCOSE 105* 75  BUN 37* 20  CREATININE 1.16 1.01  CALCIUM 10.3 9.8   Liver Function Tests: Recent Labs  Lab 10/09/22 1343 10/14/22 1320  AST 27 17  ALT 56* 28  ALKPHOS 63 56  BILITOT 0.7 0.6  PROT 7.3 6.5  ALBUMIN 4.5 4.1   No results for input(s): "LIPASE", "AMYLASE" in the last 168 hours. No results for input(s): "AMMONIA" in the last 168 hours. CBC: Recent Labs  Lab 10/09/22 1343 10/14/22 1320  WBC 7.5 6.3  NEUTROABS 5.5 4.4  HGB 15.5 14.7  HCT 45.8 42.1  MCV 89.3 87.3  PLT 263 205   Cardiac Enzymes: No results for input(s): "CKTOTAL", "CKMB", "CKMBINDEX", "TROPONINI" in the last 168 hours.  BNP (last 3 results) No results for input(s): "BNP" in the last 8760 hours.  ProBNP (last 3 results) No results for input(s): "PROBNP" in the last 8760  hours.  CBG: No results for input(s): "GLUCAP" in the last 168 hours.  Radiological Exams on Admission: No results found.  Assessment/Plan 67 year old gentleman with a history of hypertension, prediabetes and recent diagnosis of gastric adenocarcinoma being admitted to the hospital for symptoms of gastric outlet obstruction, and anticipated EUS for staging as well as  feeding tube placement. -Observation admission to MedSurg -Gentle IV fluids -P.o. diet as tolerated, n.p.o. after midnight -Discussed with GI as well as general surgery, both of whom will see the patient in the morning  Hypertension-continue home equivalent dose ARB  GERD-Protonix 40 mg p.o. daily  Depression-continue Prozac  DVT prophylaxis: Lovenox   Full code  Consults called: Gastroenterology and general surgery  Admission status: Observation  Time spent: 48 minutes  Colleena Kurtenbach Sharlette Dense MD [facility] Pager [phone]  If 7PM-7AM, please contact night-coverage [email or web address] Password [facility]  10/14/2022, 4:34 PM

## 2022-10-14 NOTE — Progress Notes (Signed)
[facility]   Telephone:([phone] Fax:([phone]) [phone]   Clinic Follow up Note   Patient Care Team: Pincus Sanes, MD as PCP - General (Internal Medicine) Glendale Chard, DO as Consulting Physician (Neurology) [name], MD as Consulting Physician (Hematology and Oncology)  Date of Service:  10/14/2022  CHIEF COMPLAINT: f/u of Gastric cancer   CURRENT THERAPY:  Work up pending  ASSESSMENT:  Steven Wolf is a 67 y.o. male with   Adenocarcinoma of stomach (HCC) -Pending staging  -Diagnosed in July 2024, he presented with dysphagia and 25 pound weight loss. -Staging CT scan was done on October 11, 2022, which showed gastric wall thickening, new prominent subcentimeter gastrohepatic ligament in the left periaortic lymph node, concerning for nodal metastasis.  I personally reviewed his scan images in discussed the findings with patient. -I recommend a PET scan for further evaluation history for nodal metastasis. -I will review his case in tumor board tomorrow morning, with Dr. Donell Beers, to see if he is still a candidate for surgery.  Due to the probable periaortic node metastasis, he may not be a candidate for surgery. -He will be admitted to [facility] today for J-tube placement for feeding  -I recommend a port placement -He is scheduled for EUS staging by Dr. Meridee Score this Wednesday -Plan to start neoadjuvant FLOT next week if he has locally advanced disease.  If he has unresectable disease, I would recommend FOLFOX, and will obtain [facility] testing, including MMR, HER2, and PD-L1    PLAN: -lab reviewed -CMP-normal -Pt will be admitted to [facility], and will receive J feeding Tube placement and port replacement  -reviewed CT scan w/ pt, I will reach out to Dr. Donell Beers   SUMMARY OF ONCOLOGIC HISTORY: Oncology History  Adenocarcinoma of stomach (HCC)  10/01/2022 Initial Diagnosis   Adenocarcinoma of stomach (HCC)   10/01/2022 Pathology Results    Diagnosis 1. Stomach, biopsy, gastric mass, cold biopsy - POORLY DIFFERENTIATED ADENOCARCINOMA WITH SIGNET RING CELL FEATURES.  2. Stomach- Cardia, cold biopsy - OXYNTIC MUCOSA WITH LAMINA PROPRIA EDEMA. - NEGATIVE FOR MALIGNANCY ON THE SECTIONS EXAMINED   10/11/2022 Imaging    IMPRESSION: 1. Gastric wall thickening, consistent with known gastric cancer. Perigastric fat stranding, concerning for local invasion. 2. New prominent subcentimeter gastrohepatic ligament and left para-aortic lymph nodes, concerning for nodal metastatic disease. 3. Mild aortic Atherosclerosis (ICD10-I70.0).        INTERVAL HISTORY:  Steven Wolf is here for a follow up of Gastric cancer. He was last seen by   NP Lacie on 10/09/2022. He presents to the clinic accompanied by wife. Pt report of having constipation issues. Pt also report of having hemorrhoids, from having BM issues.    All other systems were reviewed with the patient and are negative.  MEDICAL HISTORY:  Past Medical History:  Diagnosis Date   Anxiety    Benign prostatic hypertrophy    Diverticulosis of colon    GERD (gastroesophageal reflux disease)    Sullivan Lone syndrome    History of kidney stones    Hypertension    Microscopic hematuria    Motion sickness    ocean boats, curvy roads   Nephrolithiasis     X 2; Dr Aldean Ast   Sleep apnea    USES CPAP   Syncope 02/2007   NOCTURNAL POST MICTURTION   Urticaria     SURGICAL HISTORY: Past Surgical History:  Procedure Laterality Date   APPENDECTOMY     COLONOSCOPY  2010   NORMAL(pt states 2 other colonoscopies in the past)   COLONOSCOPY W/ POLYPECTOMY  2002   Tics 2005 & 2010; due 2020, Dr Jarold Motto   ESOPHAGOGASTRODUODENOSCOPY (EGD) WITH PROPOFOL N/A 09/30/2022   Procedure: ESOPHAGOGASTRODUODENOSCOPY (EGD) WITH BIOPSY;  Surgeon: Midge Minium, MD;  Location: [facility];  Service: Endoscopy;  Laterality: N/A;   LIVER BIOPSY     X 1 for elevated LFTs with NEGATIVE   hepatitis serologies   TONSILLECTOMY     TONSILLECTOMY     PT STATES HAS HAD IT TWICE   UPPER GASTROINTESTINAL ENDOSCOPY  1995    I have reviewed the social history and family history with the patient and they are unchanged from previous note.  ALLERGIES:  is allergic to ramipril and penicillins.  MEDICATIONS:  No current facility-administered medications for this visit.   No current outpatient medications on file.   Facility-Administered Medications Ordered in Other Visits  Medication Dose Route Frequency Provider Last Rate Last Admin   acetaminophen (TYLENOL) tablet 650 mg  650 mg Oral Q6H PRN Kirby Crigler, Mir M, MD       Or   acetaminophen (TYLENOL) suppository 650 mg  650 mg Rectal Q6H PRN Kirby Crigler, Mir M, MD       albuterol (PROVENTIL) (2.5 MG/3ML) 0.083% nebulizer solution 2.5 mg  2.5 mg Nebulization Q2H PRN Kirby Crigler, Mir M, MD       enoxaparin (LOVENOX) injection 40 mg  40 mg Subcutaneous Q24H Kirby Crigler, Mir M, MD       Melene Muller ON 10/15/2022] FLUoxetine (PROZAC) capsule 20 mg  20 mg Oral Daily Kirby Crigler, Mir M, MD       irbesartan (AVAPRO) tablet 75 mg  75 mg Oral Daily Kirby Crigler, Mir M, MD   75 mg at 10/14/22 2137   [START ON 10/15/2022] pantoprazole (PROTONIX) EC tablet 40 mg  40 mg Oral Daily Kirby Crigler, Mir M, MD       traZODone (DESYREL) tablet 25 mg  25 mg Oral QHS PRN Kirby Crigler, Mir M, MD        PHYSICAL EXAMINATION: ECOG PERFORMANCE STATUS: 2 - Symptomatic, <50% confined to bed  Vitals:   10/14/22 1331  BP: (!) 118/91  Pulse: (!) 114  Resp: 17  Temp: 98 F (36.7 C)  SpO2: 98%   Wt Readings from Last 3 Encounters:  10/14/22 159 lb 6.3 oz (72.3 kg)  10/14/22 159 lb 4.8 oz (72.3 kg)  10/09/22 160 lb 3.2 oz (72.7 kg)     GENERAL:alert, no distress and comfortable SKIN: skin color normal, no rashes or significant lesions EYES: normal, Conjunctiva are pink and non-injected, sclera clear  NEURO: alert & oriented x 3 with fluent speech LABORATORY DATA:   I have reviewed the data as listed    Latest Ref Rng & Units 10/14/2022    1:20 PM 10/09/2022    1:43 PM 06/18/2022    9:23 AM  CBC  WBC 4.0 - 10.5 K/uL 6.3  7.5  4.3   Hemoglobin 13.0 - 17.0 g/dL 16.1  09.6  04.5   Hematocrit 39.0 - 52.0 % 42.1  45.8  47.2   Platelets 150 - 400 K/uL 205  263  222.0         Latest Ref Rng & Units 10/14/2022    1:20 PM 10/09/2022    1:43 PM 06/18/2022    9:23 AM  CMP  Glucose 70 - 99 mg/dL 75  409  87   BUN 8 - 23 mg/dL 20  37  15   Creatinine 0.61 - 1.24 mg/dL 1.61  0.96  0.45   Sodium 135 - 145 mmol/L 138  137  141   Potassium 3.5 - 5.1 mmol/L 4.1  3.9  4.2   Chloride 98 - 111 mmol/L 102  100  103   CO2 22 - 32 mmol/L 25  27  29    Calcium 8.9 - 10.3 mg/dL 9.8  40.9  9.5   Total Protein 6.5 - 8.1 g/dL 6.5  7.3  6.9   Total Bilirubin 0.3 - 1.2 mg/dL 0.6  0.7  0.7   Alkaline Phos 38 - 126 U/L 56  63  76   AST 15 - 41 U/L 17  27  22    ALT 0 - 44 U/L 28  56  27       RADIOGRAPHIC STUDIES: I have personally reviewed the radiological images as listed and agreed with the findings in the report. No results found.    Orders Placed This Encounter  Procedures   NM PET Image Initial (PI) Skull Base To Thigh    Standing Status:   Future    Standing Expiration Date:   10/14/2023    Order Specific Question:   If indicated for the ordered procedure, I authorize the administration of a radiopharmaceutical per Radiology protocol    Answer:   Yes    Order Specific Question:   Preferred imaging location?    Answer:   [facility]   All questions were answered. The patient knows to call the clinic with any problems, questions or concerns. No barriers to learning was detected. The total time spent in the appointment was 30 minutes.     [name], MD 10/14/2022   Carolin Coy, CMA, am acting as scribe for [name], MD.   I have reviewed the above documentation for accuracy and completeness, and I agree with the above.

## 2022-10-14 NOTE — Assessment & Plan Note (Signed)
-  Pending staging  -Diagnosed in July 2024, he presented with dysphagia and 25 pound weight loss. -Staging CT scan was done on October 11, 2022, formal report is still pending -Due to the significant weight loss and nausea, vomiting, poor oral intake, we recommend J feeding tube placement by surgeon Dr. Donell Beers.  He will be admitted to hospital for feeding tube placement -He is scheduled for EUS staging by Dr. Meridee Score this Wednesday -Plan to start neoadjuvant FLOT next week if he has locally advanced disease.

## 2022-10-14 NOTE — Patient Instructions (Signed)

## 2022-10-14 NOTE — Progress Notes (Signed)
Report given to Efraim Kaufmann, RN, on [employer] 6E over phone.  Patient registered and escorted to room 1618 in [place] with IVF infusing.

## 2022-10-14 NOTE — Plan of Care (Signed)
Plan of Care Note for Accepted Transfer   Patient: Steven Wolf    ZOX:[id number]     Facility requesting transfer: [facility] Requesting Provider: Dr. Mosetta Putt  67 year old gentleman unfortunately with recent diagnosis of gastric adenocarcinoma who is being admitted to the hospital for feeding tube placement with Dr. Donell Beers and also needs EUS which has already been planned for Wednesday 7/24 with Dr. Meridee Score.  We were contacted by his oncologist Dr. Mosetta Putt who would like to admit the patient to the hospital for expeditious workup and feeding tube placement.   Patient is currently arriving to cancer clinic, at which point labs and vitals will be checked.  Patient has been accepted to MedSurg bed, assuming there are no surprises.  Most recent vitals, labs and radiology:  There were no vitals taken for this visit.      Latest Ref Rng & Units 10/09/2022    1:43 PM 06/18/2022    9:23 AM 06/14/2021    9:36 AM  CBC  WBC 4.0 - 10.5 K/uL 7.5  4.3  4.5   Hemoglobin 13.0 - 17.0 g/dL 81.1  91.4  78.2   Hematocrit 39.0 - 52.0 % 45.8  47.2  47.1   Platelets 150 - 400 K/uL 263  222.0  200.0       Latest Ref Rng & Units 10/09/2022    1:43 PM 06/18/2022    9:23 AM 06/14/2021    9:36 AM  BMP  Glucose 70 - 99 mg/dL 956  87  87   BUN 8 - 23 mg/dL 37  15  17   Creatinine 0.61 - 1.24 mg/dL 2.13  0.86  5.78   Sodium 135 - 145 mmol/L 137  141  142   Potassium 3.5 - 5.1 mmol/L 3.9  4.2  4.7   Chloride 98 - 111 mmol/L 100  103  104   CO2 22 - 32 mmol/L 27  29  30    Calcium 8.9 - 10.3 mg/dL 46.9  9.5  9.6      No results found.   The patient has been accepted for direct admission to [facility].  The patient will remain under the care and responsibility of the referring provider until they have arrived to our inpatient facility.  Author: Leeanne Butters Sharlette Dense, MD  10/14/2022  Check [email or web address] for on-call coverage.  Nursing staff, Please call [facility] Admits & Consults  System-Wide number on Amion as soon as patient's arrival, so appropriate admitting provider can evaluate the pt.

## 2022-10-15 ENCOUNTER — Encounter: Payer: Self-pay | Admitting: Hematology

## 2022-10-15 DIAGNOSIS — E43 Unspecified severe protein-calorie malnutrition: Secondary | ICD-10-CM | POA: Diagnosis not present

## 2022-10-15 DIAGNOSIS — F419 Anxiety disorder, unspecified: Secondary | ICD-10-CM | POA: Diagnosis present

## 2022-10-15 DIAGNOSIS — G893 Neoplasm related pain (acute) (chronic): Secondary | ICD-10-CM | POA: Diagnosis present

## 2022-10-15 DIAGNOSIS — E876 Hypokalemia: Secondary | ICD-10-CM | POA: Diagnosis present

## 2022-10-15 DIAGNOSIS — K449 Diaphragmatic hernia without obstruction or gangrene: Secondary | ICD-10-CM | POA: Diagnosis present

## 2022-10-15 DIAGNOSIS — K567 Ileus, unspecified: Secondary | ICD-10-CM | POA: Diagnosis not present

## 2022-10-15 DIAGNOSIS — K632 Fistula of intestine: Secondary | ICD-10-CM | POA: Diagnosis not present

## 2022-10-15 DIAGNOSIS — Z6823 Body mass index (BMI) 23.0-23.9, adult: Secondary | ICD-10-CM | POA: Diagnosis not present

## 2022-10-15 DIAGNOSIS — C169 Malignant neoplasm of stomach, unspecified: Secondary | ICD-10-CM | POA: Diagnosis not present

## 2022-10-15 DIAGNOSIS — I10 Essential (primary) hypertension: Secondary | ICD-10-CM | POA: Diagnosis not present

## 2022-10-15 DIAGNOSIS — G4733 Obstructive sleep apnea (adult) (pediatric): Secondary | ICD-10-CM | POA: Diagnosis not present

## 2022-10-15 DIAGNOSIS — C786 Secondary malignant neoplasm of retroperitoneum and peritoneum: Secondary | ICD-10-CM | POA: Diagnosis present

## 2022-10-15 DIAGNOSIS — N400 Enlarged prostate without lower urinary tract symptoms: Secondary | ICD-10-CM | POA: Diagnosis present

## 2022-10-15 DIAGNOSIS — K222 Esophageal obstruction: Secondary | ICD-10-CM | POA: Diagnosis not present

## 2022-10-15 DIAGNOSIS — Z888 Allergy status to other drugs, medicaments and biological substances status: Secondary | ICD-10-CM | POA: Diagnosis not present

## 2022-10-15 DIAGNOSIS — R627 Adult failure to thrive: Secondary | ICD-10-CM | POA: Diagnosis present

## 2022-10-15 DIAGNOSIS — Z8249 Family history of ischemic heart disease and other diseases of the circulatory system: Secondary | ICD-10-CM | POA: Diagnosis not present

## 2022-10-15 DIAGNOSIS — F32A Depression, unspecified: Secondary | ICD-10-CM | POA: Diagnosis present

## 2022-10-15 DIAGNOSIS — Z880 Allergy status to penicillin: Secondary | ICD-10-CM | POA: Diagnosis not present

## 2022-10-15 DIAGNOSIS — Z515 Encounter for palliative care: Secondary | ICD-10-CM | POA: Diagnosis not present

## 2022-10-15 DIAGNOSIS — R188 Other ascites: Secondary | ICD-10-CM | POA: Diagnosis present

## 2022-10-15 DIAGNOSIS — J982 Interstitial emphysema: Secondary | ICD-10-CM | POA: Diagnosis not present

## 2022-10-15 DIAGNOSIS — G4730 Sleep apnea, unspecified: Secondary | ICD-10-CM | POA: Diagnosis present

## 2022-10-15 DIAGNOSIS — K311 Adult hypertrophic pyloric stenosis: Secondary | ICD-10-CM | POA: Diagnosis present

## 2022-10-15 DIAGNOSIS — K312 Hourglass stricture and stenosis of stomach: Secondary | ICD-10-CM | POA: Diagnosis not present

## 2022-10-15 DIAGNOSIS — K2100 Gastro-esophageal reflux disease with esophagitis, without bleeding: Secondary | ICD-10-CM | POA: Diagnosis present

## 2022-10-15 DIAGNOSIS — E785 Hyperlipidemia, unspecified: Secondary | ICD-10-CM | POA: Diagnosis not present

## 2022-10-15 DIAGNOSIS — C163 Malignant neoplasm of pyloric antrum: Secondary | ICD-10-CM | POA: Diagnosis not present

## 2022-10-15 LAB — BASIC METABOLIC PANEL
Anion gap: 11 (ref 5–15)
BUN: 19 mg/dL (ref 8–23)
CO2: 23 mmol/L (ref 22–32)
Calcium: 8.7 mg/dL — ABNORMAL LOW (ref 8.9–10.3)
Chloride: 103 mmol/L (ref 98–111)
Creatinine, Ser: 0.96 mg/dL (ref 0.61–1.24)
GFR, Estimated: >60 mL/min (ref >60)
Glucose, Bld: 81 mg/dL (ref 70–99)
Potassium: 3.6 mmol/L (ref 3.5–5.1)
Sodium: 137 mmol/L (ref 135–145)

## 2022-10-15 LAB — CBC
HCT: 39 % (ref 39.0–52.0)
Hemoglobin: 13.3 g/dL (ref 13.0–17.0)
MCH: 30.8 pg (ref 26.0–34.0)
MCHC: 34.1 g/dL (ref 30.0–36.0)
MCV: 90.3 fL (ref 80.0–100.0)
Platelets: 176 10*3/uL (ref 150–400)
RBC: 4.32 MIL/uL (ref 4.22–5.81)
RDW: 12.7 % (ref 11.5–15.5)
WBC: 5 10*3/uL (ref 4.0–10.5)
nRBC: 0 % (ref 0.0–0.2)

## 2022-10-15 LAB — HIV ANTIBODY (ROUTINE TESTING W REFLEX): HIV Screen 4th Generation wRfx: NONREACTIVE

## 2022-10-15 MED ORDER — BISACODYL 10 MG RE SUPP
10.0000 mg | Freq: Every day | RECTAL | Status: DC | PRN
  Administered 2022-10-15 – 2022-10-22 (×2): 10 mg via RECTAL
  Filled 2022-10-15 (×2): qty 1

## 2022-10-15 MED ORDER — SODIUM CHLORIDE 0.9 % IV SOLN: INTRAVENOUS | Status: DC

## 2022-10-15 MED ORDER — POLYETHYLENE GLYCOL 3350 17 G PO PACK
17.0000 g | PACK | Freq: Every day | ORAL | Status: DC
  Administered 2022-10-15: 17 g via ORAL
  Filled 2022-10-15 (×2): qty 1

## 2022-10-15 MED ORDER — SODIUM CHLORIDE 0.9 % IV SOLN
2.0000 g | INTRAVENOUS | Status: AC
  Administered 2022-10-16: 2 g via INTRAVENOUS
  Filled 2022-10-15: qty 2

## 2022-10-15 NOTE — H&P (View-Only) (Signed)
Patient ID: Steven Wolf, male   DOB: 10-Aug-1955, 67 y.o.   MRN: [id number] [facility] Consult Note  Steven Wolf 1955-09-16  [id number].    Requesting MD: [name]  Chief Complaint/Reason for Consult: gastric cancer  HPI:  Steven Wolf is a 67 y.o. 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 Patient was diagnosed in July 2024 after presenting to his PCP with 2 months of postprandial fullness/belching and a 25lb weight loss. He also reports some mild left sided abdominal discomfort at times, constipation, worsening reflux, as well as difficulty with PO intake. He is followed by Dr. Mosetta Putt and underwent staging CT scan on 10/11/2022 which showed gastric wall thickening, new prominent subcentimeter gastrohepatic ligament in the left periaortic lymph node, concerning for nodal metastasis. He is scheduled for EUS staging by Dr. Meridee Score tomorrow. We are asked to see for consideration of a feeding tube.   Abdominal surgical history: open appendectomy Anticoagulants: none Nonsmoker Employment: retired Art gallery manager Lives at home with his wife   Family History  Problem Relation Age of Onset   Breast cancer Mother    Rheumatologic disease Mother        RA   Heart disease Father        PCV;MI ? 80   Polycythemia Father    Leukemia Paternal Uncle    Polycythemia Paternal Uncle    Heart attack Paternal Grandfather        > 55   Stroke Maternal Grandmother        . 65   Sleep apnea Sister    Diabetes Neg Hx    Colon cancer Neg Hx    Colon polyps Neg Hx    Esophageal cancer Neg Hx    Stomach cancer Neg Hx    Rectal cancer Neg Hx     Past Medical History:  Diagnosis Date   Anxiety    Benign prostatic hypertrophy    Diverticulosis of colon    GERD (gastroesophageal reflux disease)    Sullivan Lone syndrome    History of kidney stones    Hypertension    Microscopic hematuria     Motion sickness    ocean boats, curvy roads   Nephrolithiasis     X 2; Dr Aldean Ast   Sleep apnea    USES CPAP   Syncope 02/2007   NOCTURNAL POST MICTURTION   Urticaria     Past Surgical History:  Procedure Laterality Date   APPENDECTOMY     COLONOSCOPY  2010   NORMAL(pt states 2 other colonoscopies in the past)   COLONOSCOPY W/ POLYPECTOMY  2002   Tics 2005 & 2010; due 2020, Dr Jarold Motto   ESOPHAGOGASTRODUODENOSCOPY (EGD) WITH PROPOFOL N/A 09/30/2022   Procedure: ESOPHAGOGASTRODUODENOSCOPY (EGD) WITH BIOPSY;  Surgeon: Midge Minium, MD;  Location: [facility];  Service: Endoscopy;  Laterality: N/A;   LIVER BIOPSY     X 1 for elevated LFTs with NEGATIVE  hepatitis serologies   TONSILLECTOMY     TONSILLECTOMY     PT STATES HAS HAD IT TWICE   UPPER GASTROINTESTINAL ENDOSCOPY  1995    Social History:  reports that he has never smoked. He has never used smokeless tobacco. He reports that he does not drink alcohol and does not use drugs.  Allergies:  Allergies  Allergen Reactions   Ramipril Other (See Comments)    Elevated liver enzymes   Penicillins Other (See Comments)    ?  Reaction (as child)    Medications Prior to Admission  Medication Sig Dispense Refill   FLUoxetine (PROZAC) 20 MG tablet Take 1 tablet (20 mg total) by mouth daily. 90 tablet 3   Nutritional Supplements (ENSURE COMPLETE SHAKE) LIQD Take 1 Can by mouth daily.     omeprazole (PRILOSEC) 20 MG capsule Take 1 capsule (20 mg total) by mouth daily. Take daily 30 minutes prior to a meal for 2 weeks then take as needed for reflux (Patient taking differently: Take 20 mg by mouth daily before breakfast.) 30 capsule 3   PRESCRIPTION MEDICATION CPAP- At bedtime as determined by the patient     prochlorperazine (COMPAZINE) 10 MG tablet Take 1 tablet (10 mg total) by mouth every 6 (six) hours as needed for nausea or vomiting. 30 tablet 1   senna (SENOKOT) 8.6 MG TABS tablet Take 1 tablet (8.6 mg total) by mouth  daily as needed for mild constipation. 30 tablet 0   valsartan (DIOVAN) 80 MG tablet Take 1 tablet (80 mg total) by mouth daily. (Patient taking differently: Take 80 mg by mouth every evening.) 90 tablet 3   ondansetron (ZOFRAN) 4 MG tablet Take 1 tablet (4 mg total) by mouth every 6 (six) hours as needed for nausea or vomiting. (Patient not taking: Reported on 10/14/2022) 30 tablet 0    Prior to Admission medications   Medication Sig Start Date End Date Taking? Authorizing Provider  FLUoxetine (PROZAC) 20 MG tablet Take 1 tablet (20 mg total) by mouth daily. 06/18/22  Yes Burns, [name], MD  Nutritional Supplements (ENSURE COMPLETE SHAKE) LIQD Take 1 Can by mouth daily.   Yes [provider]  omeprazole (PRILOSEC) 20 MG capsule Take 1 capsule (20 mg total) by mouth daily. Take daily 30 minutes prior to a meal for 2 weeks then take as needed for reflux Patient taking differently: Take 20 mg by mouth daily before breakfast. 08/13/22  Yes Burns, [name], MD  PRESCRIPTION MEDICATION CPAP- At bedtime as determined by the patient   Yes [provider]  prochlorperazine (COMPAZINE) 10 MG tablet Take 1 tablet (10 mg total) by mouth every 6 (six) hours as needed for nausea or vomiting. 10/09/22  Yes [name], MD  senna (SENOKOT) 8.6 MG TABS tablet Take 1 tablet (8.6 mg total) by mouth daily as needed for mild constipation. 10/09/22  Yes Pollyann Samples, NP  valsartan (DIOVAN) 80 MG tablet Take 1 tablet (80 mg total) by mouth daily. Patient taking differently: Take 80 mg by mouth every evening. 06/18/22  Yes Burns, [name], MD  ondansetron (ZOFRAN) 4 MG tablet Take 1 tablet (4 mg total) by mouth every 6 (six) hours as needed for nausea or vomiting. Patient not taking: Reported on 10/14/2022 09/25/22   Celso Amy, PA-C    Blood pressure 125/88, pulse 72, temperature 97.8 F (36.6 C), temperature source Oral, resp. rate 14, height 6' (1.829 m), weight 72.3 kg, SpO2 97%. Physical  Exam: General: pleasant, WD/WN male who is laying in bed in NAD HEENT: head is normocephalic, atraumatic.  Sclera are noninjected.  Pupils equal and round.  Ears and nose without any masses or lesions.  Mouth is pink and moist. Dentition fair Heart: regular, rate, and rhythm Lungs: CTAB, no wheezes, rhonchi, or rales noted.  Respiratory effort nonlabored on room air Abd: well healed open incision RLQ, soft, NT/ND, +BS, no masses, hernias, or organomegaly MS: no BUE/BLE edema, calves soft and nontender Skin: warm and dry with  no masses, lesions, or rashes Psych: A&Ox4 with an appropriate affect Neuro: MAEs, no gross motor or sensory deficits BUE/BLE  Results for orders placed or performed during the hospital encounter of 10/14/22 (from the past 48 hour(s))  Basic metabolic panel     Status: Abnormal   Collection Time: 10/15/22  6:06 AM  Result Value Ref Range   Sodium 137 135 - 145 mmol/L   Potassium 3.6 3.5 - 5.1 mmol/L   Chloride 103 98 - 111 mmol/L   CO2 23 22 - 32 mmol/L   Glucose, Bld 81 70 - 99 mg/dL    Comment: Glucose reference range applies only to samples taken after fasting for at least 8 hours.   BUN 19 8 - 23 mg/dL   Creatinine, Ser 5.62 0.61 - 1.24 mg/dL   Calcium 8.7 (L) 8.9 - 10.3 mg/dL   GFR, Estimated >13 >08 mL/min    Comment: (NOTE) Calculated using the CKD-EPI Creatinine Equation (2021)    Anion gap 11 5 - 15    Comment: Performed at [facility], [address], [place], [place] [address]  CBC     Status: None   Collection Time: 10/15/22  6:06 AM  Result Value Ref Range   WBC 5.0 4.0 - 10.5 K/uL   RBC 4.32 4.22 - 5.81 MIL/uL   Hemoglobin 13.3 13.0 - 17.0 g/dL   HCT 69.6 29.5 - 28.4 %   MCV 90.3 80.0 - 100.0 fL   MCH 30.8 26.0 - 34.0 pg   MCHC 34.1 30.0 - 36.0 g/dL   RDW 13.2 44.0 - 10.2 %   Platelets 176 150 - 400 K/uL   nRBC 0.0 0.0 - 0.2 %    Comment: Performed at [facility], [address], [place], [place]  [address]   No results found.  Anti-infectives (From admission, onward)    None        Assessment/Plan Gastric cancer - Patient with adenocarcinoma of the stomach diagnosed 09/2022. He is not a candidate for surgery upfront, and plans to start neoadjuvant chemo next week. He is scheduled for EUS staging with Dr. Meridee Score tomorrow morning. We will plan for diagnostic laparoscopy, open jejunostomy placement, and port-a-cath placement. Discussed with anesthesia and it would be ok to proceed with abdominal surgery tomorrow following his anesthesia for EUS, but will plan to obtain consents prior to his first procedure.   ID - none VTE - lovenox FEN - NPO Foley - none  HTN GERD Depression  I reviewed Consultant oncology notes, hospitalist notes, last 24 h vitals and pain scores, last 48 h intake and output, last 24 h labs and trends, and last 24 h imaging results.   Franne Forts, PA-C [facility] 10/15/2022, 9:32 AM Please see Amion for pager number during day hours 7:00am-4:30pm

## 2022-10-15 NOTE — Consult Note (Addendum)
Consultation Note   Referring Provider:  [facility] PCP: Pincus Sanes, MD Primary Gastroenterologist: Dr. 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  --Already scheduled for staging EUS tomorrow with Dr. Meridee Score.  --Oncology plans to start neoadjuvant treatment next week if he has locally advanced disease.  If he has unresectable disease, the plan is for possible FOLFOX --Notified Dr. Meridee Score and Endoscopy that patient is now admitted.  --NPO after MN --Will hold tonight's Lovenox for am EUS --Surgery has evaluated and planning for jejunostomy placement, and port-a-cath placement  GERD with pyrosis -Continue daily PPI  Constipation -Start daily Miralax  History of adenomatous colon polyps. Small tubular adenoma removed in March 2021  See PMH for additional medical history    Attending Physician Note   I have taken a history, reviewed the chart and examined the patient. I performed a substantive portion of this encounter, including complete performance of at least one of the key components, in conjunction with the APP. I agree with the APP's note, impression and recommendations with my edits. My additional impressions and recommendations are as follows.   Gastric adenocarcinoma, poorly differentiated with signet ring features, and partial GOO. Pyloric stenosis not traversed at EGD. CT shows sub centimeter lymph nodes at gastrohepatic ligament and in left para-aortic region.   Surgery plans J-tube placement, port placement, diagnostic laparoscopy Staging EUS tomorrow  with Dr. Meridee Score Assess potential role for an enteral stent at EUS DYS 3 diet for now Additional GI recommendations per Dr. Alden Hipp, MD [facility] See Loretha Stapler, [facility], for our on call provider     Pertinent GI History:   Patient underwent EGD early July 2024 by  Dr. Servando Snare for GERD and weight loss. Found to have mild reflux esophagitis and gastric cancer.   EGD 09/30/22 -gastric tumor at the pylorus, in the prepyloric region of the stomach, in the cardia, in the gastric body, on the anterior wall of the stomach, on the lesser curvature of the stomach, at the incisura and in the gastric antrum.gastric stenosis was found at the pylorus. Stomach, biopsy, gastric mass, cold biopsy - POORLY DIFFERENTIATED ADENOCARCINOMA WITH SIGNET RING CELL FEATURES. NOTE: DR. SHARMA HAS PEER REVIEWED THE CASE AND AGREES WITH THE INTERPRETATION. DR. Servando Snare WAS INFORMED OF THE DIAGNOSIS. AN IMMUNOHISTOCHEMICAL STAIN FOR HER2/NEUTROPHILS IS PENDING AND WILL BE REPORTED IN AN ADDENDUM. 2. Stomach- Cardia, cold biopsy - OXYNTIC MUCOSA WITH LAMINA PROPRIA EDEMA. - NEGATIVE FOR MALIGNANCY ON THE SECTIONS EXAMINED.  Colonoscopy March 2021 - BBPS of 6 for bowel prep - Diverticulosis in the sigmoid colon.  - One 4-5 mm polyp in the distal ascending colon, removed with a cold snare. Resected and retrieved. - The examination was otherwise normal on direct and retroflexion views Path - tubular adenoma   HISTORY OF PRESENT ILLNESS   Patient is a 67  y.o. year old male with a past medical history of HTN, anxiety, colon polyps, and recently diagnosed gastric adenocarcinoma. He is being followed by Oncologist Dr. Mosetta Putt. Staging CT scan done on October 11, 2022 showed gastric wall thickening, new prominent subcentimeter gastrohepatic ligament in the left periaortic lymph node, concerning for nodal metastasis.  He was scheduled to undergo EUS with Dr. Meridee Score on 7/24. Dr. Mosetta Putt had him admitted yesterday for  J-tube placment.and port placement  Other than belching and some post-prandial bloating (if eats too much) he feels okay.  He has lost an excessive amount of weight being able to consume only smaller portions of food. He has been constipated lately and taking senna at home. He has occasional rectal bleeding with hemorrhoids but no black stools. CBC normal.    Labs and Imaging: Recent Labs    10/14/22 1320 10/15/22 0606  WBC 6.3 5.0  HGB 14.7 13.3  HCT 42.1 39.0  PLT 205 176   Recent Labs    10/14/22 1320 10/15/22 0606  NA 138 137  K 4.1 3.6  CL 102 103  CO2 25 23  GLUCOSE 75 81  BUN 20 19  CREATININE 1.01 0.96  CALCIUM 9.8 8.7*   Recent Labs    10/14/22 1320  PROT 6.5  ALBUMIN 4.1  AST 17  ALT 28  ALKPHOS 56  BILITOT 0.6   No results for input(s): "HEPBSAG", "HCVAB", "HEPAIGM", "HEPBIGM" in the last 72 hours. No results for input(s): "LABPROT", "INR" in the last 72 hours.    Past Medical History:  Diagnosis Date   Anxiety    Benign prostatic hypertrophy    Diverticulosis of colon    GERD (gastroesophageal reflux disease)    Sullivan Lone syndrome    History of kidney stones    Hypertension    Microscopic hematuria    Motion sickness    ocean boats, curvy roads   Nephrolithiasis     X 2; Dr Aldean Ast   Sleep apnea    USES CPAP   Syncope 02/2007   NOCTURNAL POST MICTURTION   Urticaria     Past Surgical History:  Procedure Laterality Date   APPENDECTOMY     COLONOSCOPY  2010   NORMAL(pt states 2 other colonoscopies in the past)   COLONOSCOPY W/ POLYPECTOMY  2002   Tics 2005 & 2010; due 2020, Dr Jarold Motto   ESOPHAGOGASTRODUODENOSCOPY (EGD) WITH PROPOFOL N/A 09/30/2022   Procedure: ESOPHAGOGASTRODUODENOSCOPY (EGD) WITH BIOPSY;  Surgeon: Midge Minium, MD;  Location[facility];  Service: Endoscopy;  Laterality: N/A;   LIVER BIOPSY     X 1 for elevated LFTs with NEGATIVE  hepatitis serologies   TONSILLECTOMY     TONSILLECTOMY     PT STATES HAS  HAD IT TWICE   UPPER GASTROINTESTINAL ENDOSCOPY  1995    Family History  Problem Relation Age of Onset   Breast cancer Mother    Rheumatologic disease Mother        RA   Heart disease Father        PCV;MI ? 42   Polycythemia Father    Leukemia Paternal Uncle    Polycythemia Paternal Uncle    Heart attack Paternal Grandfather        > 25   Stroke Maternal Grandmother        . 65   Sleep apnea Sister    Diabetes Neg Hx    Colon cancer Neg Hx    Colon polyps Neg Hx    Esophageal  cancer Neg Hx    Stomach cancer Neg Hx    Rectal cancer Neg Hx     Prior to Admission medications   Medication Sig Start Date End Date Taking? Authorizing Provider  FLUoxetine (PROZAC) 20 MG tablet Take 1 tablet (20 mg total) by mouth daily. 06/18/22  Yes Burns, [name], MD  Nutritional Supplements (ENSURE COMPLETE SHAKE) LIQD Take 1 Can by mouth daily.   Yes [provider]  omeprazole (PRILOSEC) 20 MG capsule Take 1 capsule (20 mg total) by mouth daily. Take daily 30 minutes prior to a meal for 2 weeks then take as needed for reflux Patient taking differently: Take 20 mg by mouth daily before breakfast. 08/13/22  Yes Burns, [name], MD  PRESCRIPTION MEDICATION CPAP- At bedtime as determined by the patient   Yes [provider]  prochlorperazine (COMPAZINE) 10 MG tablet Take 1 tablet (10 mg total) by mouth every 6 (six) hours as needed for nausea or vomiting. 10/09/22  Yes [name], MD  senna (SENOKOT) 8.6 MG TABS tablet Take 1 tablet (8.6 mg total) by mouth daily as needed for mild constipation. 10/09/22  Yes Pollyann Samples, NP  valsartan (DIOVAN) 80 MG tablet Take 1 tablet (80 mg total) by mouth daily. Patient taking differently: Take 80 mg by mouth every evening. 06/18/22  Yes Burns, [name], MD  ondansetron (ZOFRAN) 4 MG tablet Take 1 tablet (4 mg total) by mouth every 6 (six) hours as needed for nausea or vomiting. Patient not taking: Reported on 10/14/2022 09/25/22   Celso Amy, PA-C     Current Facility-Administered Medications  Medication Dose Route Frequency Provider Last Rate Last Admin   0.9 %  sodium chloride infusion   Intravenous Continuous Hughie Closs, MD 100 mL/hr at 10/15/22 1610 New Bag at 10/15/22 9604   acetaminophen (TYLENOL) tablet 650 mg  650 mg Oral Q6H PRN Maryln Gottron, MD       Or   acetaminophen (TYLENOL) suppository 650 mg  650 mg Rectal Q6H PRN Kirby Crigler, Mir M, MD       albuterol (PROVENTIL) (2.5 MG/3ML) 0.083% nebulizer solution 2.5 mg  2.5 mg Nebulization Q2H PRN Kirby Crigler, Mir M, MD       Melene Muller ON 10/16/2022] cefoTEtan (CEFOTAN) 1 g in sodium chloride 0.9 % 100 mL IVPB  1 g Intravenous On Call to OR Meuth, Brooke A, PA-C       enoxaparin (LOVENOX) injection 40 mg  40 mg Subcutaneous Q24H Kirby Crigler, Mir M, MD       FLUoxetine (PROZAC) capsule 20 mg  20 mg Oral Daily Kirby Crigler, Mir M, MD   20 mg at 10/15/22 1033   irbesartan (AVAPRO) tablet 75 mg  75 mg Oral Daily Kirby Crigler, Mir M, MD   75 mg at 10/14/22 2137   pantoprazole (PROTONIX) EC tablet 40 mg  40 mg Oral Daily Kirby Crigler, Mir M, MD   40 mg at 10/15/22 1033   traZODone (DESYREL) tablet 25 mg  25 mg Oral QHS PRN Maryln Gottron, MD        Allergies as of 10/14/2022 - Review Complete 10/14/2022  Allergen Reaction Noted   Ramipril Other (See Comments)    Penicillins Other (See Comments)     Social History   Socioeconomic History   Marital status: Married    Spouse name: Not on file   Number of children: 0   Years of education: Not on file   Highest education level: Master's degree (e.g., MA,  MS, MEng, MEd, MSW, MBA)  Occupational History   Not on file  Tobacco Use   Smoking status: Never   Smokeless tobacco: Never   Tobacco comments:    Second hand smoke  Vaping Use   Vaping status: Never Used  Substance and Sexual Activity   Alcohol use: No   Drug use: No   Sexual activity: Not on file  Other Topics Concern   Not on file  Social History Narrative   Right  Handed   Lives in a two story home    Drinks some caffeine    Social Determinants of Health   Financial Resource Strain: Low Risk  (08/12/2022)   Overall Financial Resource Strain (CARDIA)    Difficulty of Paying Living Expenses: Not hard at all  Food Insecurity: No Food Insecurity (10/14/2022)   Hunger Vital Sign    Worried About Running Out of Food in the Last Year: Never true    Ran Out of Food in the Last Year: Never true  Transportation Needs: No Transportation Needs (10/14/2022)   PRAPARE - Administrator, Civil Service (Medical): No    Lack of Transportation (Non-Medical): No  Physical Activity: Sufficiently Active (08/12/2022)   Exercise Vital Sign    Days of Exercise per Week: 6 days    Minutes of Exercise per Session: 50 min  Stress: Stress Concern Present (08/12/2022)   [employer] of Occupational Health - Occupational Stress Questionnaire    Feeling of Stress : To some extent  Social Connections: Unknown (08/12/2022)   Social Connection and Isolation Panel [NHANES]    Frequency of Communication with Friends and Family: Once a week    Frequency of Social Gatherings with Friends and Family: Patient declined    Attends Religious Services: Never    Database administrator or Organizations: Not on file    Attends Banker Meetings: Not on file    Marital Status: Married  Intimate Partner Violence: Not At Risk (10/14/2022)   Humiliation, Afraid, Rape, and Kick questionnaire    Fear of Current or Ex-Partner: No    Emotionally Abused: No    Physically Abused: No    Sexually Abused: No     Code Status   Code Status: Full Code  Review of Systems: All systems reviewed and negative except where noted in HPI.  Physical Exam: Vital signs in last 24 hours: Temp:  [97.5 F (36.4 C)-98.2 F (36.8 C)] 97.8 F (36.6 C) (07/23 0349) Pulse Rate:  [70-114] 72 (07/23 0349) Resp:  [14-20] 14 (07/23 0349) BP: (108-134)/(80-92) 125/88 (07/23 0349) SpO2:   [97 %-99 %] 97 % (07/23 0349) Weight:  [72.3 kg] 72.3 kg (07/22 1502) Last BM Date : 10/12/22  General:  Pleasant male in NAD Psych:  Cooperative. Normal mood and affect Eyes: Pupils equal Ears:  Normal auditory acuity Nose: No deformity, discharge or lesions Neck:  Supple, no masses felt Lungs:  Clear to auscultation.  Heart:  Regular rate, regular rhythm.  Abdomen:  Soft, nondistended, nontender, active bowel sounds, no masses felt Rectal :  Deferred Msk: Symmetrical without gross deformities.  Neurologic:  Alert, oriented, grossly normal neurologically Extremities : No edema Skin:  Intact without significant lesions.    Intake/Output from previous day: No intake/output data recorded. Intake/Output this shift:  No intake/output data recorded.  Principal Problem:   Partial gastric outlet obstruction Active Problems:   Essential hypertension, benign   BPH (benign prostatic hyperplasia)   Anxiety  Adenocarcinoma of stomach (HCC)    Willette Cluster, NP-C @  10/15/2022, 11:10 AM

## 2022-10-15 NOTE — Progress Notes (Signed)
Requested MMR testing on Accession: [id number] via email to Anderson Malta at [facility]

## 2022-10-15 NOTE — Progress Notes (Signed)
PROGRESS NOTE    HUDSYN CHAMPINE  NUU:[id number] DOB: December 01, 1955 DOA: 10/14/2022 PCP: Pincus Sanes, MD   Brief Narrative:  Steven Wolf is a 67 y.o. male with medical history of recent diagnosis of adenocarcinoma, staging is still pending.  Patient went to oncology today due to poor p.o. intake, he barely is able to drink 8 ounce of water and 1-2 shakes a day.  Due to this he was directly admitted to hospital per oncology request for PEG tube placement.  GI and general surgery is consulted.   Assessment & Plan:   Principal Problem:   Partial gastric outlet obstruction Active Problems:   Essential hypertension, benign   BPH (benign prostatic hyperplasia)   Anxiety   Adenocarcinoma of stomach (HCC)  recent diagnosis of gastric adenocarcinoma / gastric outlet obstruction: Will need EUS for staging as well as feeding tube placement.  GI and general surgery to see.  Started on IV fluids.  Seen by general surgery. He is scheduled for EUS staging with Dr. Meridee Score tomorrow morning and diagnostic laparoscopy, open jejunostomy placement, and port-a-cath placement   Hypertension-blood pressure controlled, continue home equivalent dose ARB   GERD-Protonix 40 mg p.o. daily   Depression-continue Prozac  DVT prophylaxis: enoxaparin (LOVENOX) injection 40 mg Start: 10/14/22 2200 SCDs Start: 10/14/22 1626   Code Status: Full Code  Family Communication:  None present at bedside.  Plan of care discussed with patient in length and he/she verbalized understanding and agreed with it.  Status is: Observation The patient will require care spanning > 2 midnights and should be moved to inpatient because: Scheduled for PEG tube placement and EUS tomorrow morning.   Estimated body mass index is 21.62 kg/m as calculated from the following:   Height as of this encounter: 6' (1.829 m).   Weight as of this encounter: 72.3 kg.    Nutritional Assessment: Body mass index is 21.62 kg/m.Marland Kitchen Seen by  dietician.  I agree with the assessment and plan as outlined below: Nutrition Status:        . Skin Assessment: I have examined the patient's skin and I agree with the wound assessment as performed by the wound care RN as outlined below:    Consultants:  GI and general surgery  Procedures:  None  Antimicrobials:  Anti-infectives (From admission, onward)    None         Subjective: Patient seen and went.  He has no complaints.  He would like to try soft diet since his surgery is posted for tomorrow.  Objective: Vitals:   10/14/22 1515 10/14/22 1928 10/14/22 2333 10/15/22 0349  BP: (!) 120/91 (!) 134/92 (!) 134/91 125/88  Pulse: 92 73 70 72  Resp: 18 14 14 14   Temp: 97.6 F (36.4 C) (!) 97.5 F (36.4 C) 98.2 F (36.8 C) 97.8 F (36.6 C)  TempSrc: Oral Oral Oral Oral  SpO2: 99% 99% 98% 97%  Weight:      Height:       No intake or output data in the 24 hours ending 10/15/22 0752 Filed Weights   10/14/22 1502  Weight: 72.3 kg    Examination:  General exam: Appears calm and comfortable  Respiratory system: Clear to auscultation. Respiratory effort normal. Cardiovascular system: S1 & S2 heard, RRR. No JVD, murmurs, rubs, gallops or clicks. No pedal edema. Gastrointestinal system: Abdomen is nondistended, soft and nontender. No organomegaly or masses felt. Normal bowel sounds heard. Central nervous system: Alert and oriented. No focal neurological  deficits. Extremities: Symmetric 5 x 5 power. Skin: No rashes, lesions or ulcers Psychiatry: Judgement and insight appear normal. Mood & affect appropriate.    Data Reviewed: I have personally reviewed following labs and imaging studies  CBC: Recent Labs  Lab 10/09/22 1343 10/14/22 1320 10/15/22 0606  WBC 7.5 6.3 5.0  NEUTROABS 5.5 4.4  --   HGB 15.5 14.7 13.3  HCT 45.8 42.1 39.0  MCV 89.3 87.3 90.3  PLT 263 205 176   Basic Metabolic Panel: Recent Labs  Lab 10/09/22 1343 10/14/22 1320 10/15/22 0606   NA 137 138 137  K 3.9 4.1 3.6  CL 100 102 103  CO2 27 25 23   GLUCOSE 105* 75 81  BUN 37* 20 19  CREATININE 1.16 1.01 0.96  CALCIUM 10.3 9.8 8.7*   GFR: Estimated Creatinine Clearance: 74.3 mL/min (by C-G formula based on SCr of 0.96 mg/dL). Liver Function Tests: Recent Labs  Lab 10/09/22 1343 10/14/22 1320  AST 27 17  ALT 56* 28  ALKPHOS 63 56  BILITOT 0.7 0.6  PROT 7.3 6.5  ALBUMIN 4.5 4.1   No results for input(s): "LIPASE", "AMYLASE" in the last 168 hours. No results for input(s): "AMMONIA" in the last 168 hours. Coagulation Profile: No results for input(s): "INR", "PROTIME" in the last 168 hours. Cardiac Enzymes: No results for input(s): "CKTOTAL", "CKMB", "CKMBINDEX", "TROPONINI" in the last 168 hours. BNP (last 3 results) No results for input(s): "PROBNP" in the last 8760 hours. HbA1C: No results for input(s): "HGBA1C" in the last 72 hours. CBG: No results for input(s): "GLUCAP" in the last 168 hours. Lipid Profile: No results for input(s): "CHOL", "HDL", "LDLCALC", "TRIG", "CHOLHDL", "LDLDIRECT" in the last 72 hours. Thyroid Function Tests: No results for input(s): "TSH", "T4TOTAL", "FREET4", "T3FREE", "THYROIDAB" in the last 72 hours. Anemia Panel: No results for input(s): "VITAMINB12", "FOLATE", "FERRITIN", "TIBC", "IRON", "RETICCTPCT" in the last 72 hours. Sepsis Labs: No results for input(s): "PROCALCITON", "LATICACIDVEN" in the last 168 hours.  No results found for this or any previous visit (from the past 240 hour(s)).   Radiology Studies: No results found.  Scheduled Meds:  enoxaparin (LOVENOX) injection  40 mg Subcutaneous Q24H   FLUoxetine  20 mg Oral Daily   irbesartan  75 mg Oral Daily   pantoprazole  40 mg Oral Daily   Continuous Infusions:  sodium chloride       LOS: 0 days   Hughie Closs, MD [facility]  10/15/2022, 7:52 AM   *Please note that this is a verbal dictation therefore any spelling or grammatical errors are due  to the "Dragon Medical One" system interpretation.  Please page via Amion and do not message via secure chat for urgent patient care matters. Secure chat can be used for non urgent patient care matters.  How to contact the [facility] Attending or Consulting provider 7A - 7P or covering provider during after hours 7P -7A, for this patient?  Check the care team in [facility] and look for a) attending/consulting [facility] provider listed and b) the [facility] team listed. Page or secure chat 7A-7P. Log into [email or web address] and use [place]'s universal password to access. If you do not have the password, please contact the hospital operator. Locate the [facility] provider you are looking for under [facility] and page to a number that you can be directly reached. If you still have difficulty reaching the provider, please page the [facility] (Director on Call) for the Hospitalists listed on amion for assistance.

## 2022-10-15 NOTE — Progress Notes (Signed)
Patient ID: Steven Wolf, male   DOB: 10-Aug-1955, 67 y.o.   MRN: [id number] [facility] Consult Note  Steven Wolf 1955-09-16  [id number].    Requesting MD: [name]  Chief Complaint/Reason for Consult: gastric cancer  HPI:  Steven Wolf is a 67 y.o. 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 Patient was diagnosed in July 2024 after presenting to his PCP with 2 months of postprandial fullness/belching and a 25lb weight loss. He also reports some mild left sided abdominal discomfort at times, constipation, worsening reflux, as well as difficulty with PO intake. He is followed by Dr. Mosetta Putt and underwent staging CT scan on 10/11/2022 which showed gastric wall thickening, new prominent subcentimeter gastrohepatic ligament in the left periaortic lymph node, concerning for nodal metastasis. He is scheduled for EUS staging by Dr. Meridee Score tomorrow. We are asked to see for consideration of a feeding tube.   Abdominal surgical history: open appendectomy Anticoagulants: none Nonsmoker Employment: retired Art gallery manager Lives at home with his wife   Family History  Problem Relation Age of Onset   Breast cancer Mother    Rheumatologic disease Mother        RA   Heart disease Father        PCV;MI ? 80   Polycythemia Father    Leukemia Paternal Uncle    Polycythemia Paternal Uncle    Heart attack Paternal Grandfather        > 55   Stroke Maternal Grandmother        . 65   Sleep apnea Sister    Diabetes Neg Hx    Colon cancer Neg Hx    Colon polyps Neg Hx    Esophageal cancer Neg Hx    Stomach cancer Neg Hx    Rectal cancer Neg Hx     Past Medical History:  Diagnosis Date   Anxiety    Benign prostatic hypertrophy    Diverticulosis of colon    GERD (gastroesophageal reflux disease)    Sullivan Lone syndrome    History of kidney stones    Hypertension    Microscopic hematuria     Motion sickness    ocean boats, curvy roads   Nephrolithiasis     X 2; Dr Aldean Ast   Sleep apnea    USES CPAP   Syncope 02/2007   NOCTURNAL POST MICTURTION   Urticaria     Past Surgical History:  Procedure Laterality Date   APPENDECTOMY     COLONOSCOPY  2010   NORMAL(pt states 2 other colonoscopies in the past)   COLONOSCOPY W/ POLYPECTOMY  2002   Tics 2005 & 2010; due 2020, Dr Jarold Motto   ESOPHAGOGASTRODUODENOSCOPY (EGD) WITH PROPOFOL N/A 09/30/2022   Procedure: ESOPHAGOGASTRODUODENOSCOPY (EGD) WITH BIOPSY;  Surgeon: Midge Minium, MD;  Location: [facility];  Service: Endoscopy;  Laterality: N/A;   LIVER BIOPSY     X 1 for elevated LFTs with NEGATIVE  hepatitis serologies   TONSILLECTOMY     TONSILLECTOMY     PT STATES HAS HAD IT TWICE   UPPER GASTROINTESTINAL ENDOSCOPY  1995    Social History:  reports that he has never smoked. He has never used smokeless tobacco. He reports that he does not drink alcohol and does not use drugs.  Allergies:  Allergies  Allergen Reactions   Ramipril Other (See Comments)    Elevated liver enzymes   Penicillins Other (See Comments)    ?  Reaction (as child)    Medications Prior to Admission  Medication Sig Dispense Refill   FLUoxetine (PROZAC) 20 MG tablet Take 1 tablet (20 mg total) by mouth daily. 90 tablet 3   Nutritional Supplements (ENSURE COMPLETE SHAKE) LIQD Take 1 Can by mouth daily.     omeprazole (PRILOSEC) 20 MG capsule Take 1 capsule (20 mg total) by mouth daily. Take daily 30 minutes prior to a meal for 2 weeks then take as needed for reflux (Patient taking differently: Take 20 mg by mouth daily before breakfast.) 30 capsule 3   PRESCRIPTION MEDICATION CPAP- At bedtime as determined by the patient     prochlorperazine (COMPAZINE) 10 MG tablet Take 1 tablet (10 mg total) by mouth every 6 (six) hours as needed for nausea or vomiting. 30 tablet 1   senna (SENOKOT) 8.6 MG TABS tablet Take 1 tablet (8.6 mg total) by mouth  daily as needed for mild constipation. 30 tablet 0   valsartan (DIOVAN) 80 MG tablet Take 1 tablet (80 mg total) by mouth daily. (Patient taking differently: Take 80 mg by mouth every evening.) 90 tablet 3   ondansetron (ZOFRAN) 4 MG tablet Take 1 tablet (4 mg total) by mouth every 6 (six) hours as needed for nausea or vomiting. (Patient not taking: Reported on 10/14/2022) 30 tablet 0    Prior to Admission medications   Medication Sig Start Date End Date Taking? Authorizing Provider  FLUoxetine (PROZAC) 20 MG tablet Take 1 tablet (20 mg total) by mouth daily. 06/18/22  Yes Burns, [name], MD  Nutritional Supplements (ENSURE COMPLETE SHAKE) LIQD Take 1 Can by mouth daily.   Yes [provider]  omeprazole (PRILOSEC) 20 MG capsule Take 1 capsule (20 mg total) by mouth daily. Take daily 30 minutes prior to a meal for 2 weeks then take as needed for reflux Patient taking differently: Take 20 mg by mouth daily before breakfast. 08/13/22  Yes Burns, [name], MD  PRESCRIPTION MEDICATION CPAP- At bedtime as determined by the patient   Yes [provider]  prochlorperazine (COMPAZINE) 10 MG tablet Take 1 tablet (10 mg total) by mouth every 6 (six) hours as needed for nausea or vomiting. 10/09/22  Yes [name], MD  senna (SENOKOT) 8.6 MG TABS tablet Take 1 tablet (8.6 mg total) by mouth daily as needed for mild constipation. 10/09/22  Yes Pollyann Samples, NP  valsartan (DIOVAN) 80 MG tablet Take 1 tablet (80 mg total) by mouth daily. Patient taking differently: Take 80 mg by mouth every evening. 06/18/22  Yes Burns, [name], MD  ondansetron (ZOFRAN) 4 MG tablet Take 1 tablet (4 mg total) by mouth every 6 (six) hours as needed for nausea or vomiting. Patient not taking: Reported on 10/14/2022 09/25/22   Celso Amy, PA-C    Blood pressure 125/88, pulse 72, temperature 97.8 F (36.6 C), temperature source Oral, resp. rate 14, height 6' (1.829 m), weight 72.3 kg, SpO2 97%. Physical  Exam: General: pleasant, WD/WN male who is laying in bed in NAD HEENT: head is normocephalic, atraumatic.  Sclera are noninjected.  Pupils equal and round.  Ears and nose without any masses or lesions.  Mouth is pink and moist. Dentition fair Heart: regular, rate, and rhythm Lungs: CTAB, no wheezes, rhonchi, or rales noted.  Respiratory effort nonlabored on room air Abd: well healed open incision RLQ, soft, NT/ND, +BS, no masses, hernias, or organomegaly MS: no BUE/BLE edema, calves soft and nontender Skin: warm and dry with  no masses, lesions, or rashes Psych: A&Ox4 with an appropriate affect Neuro: MAEs, no gross motor or sensory deficits BUE/BLE  Results for orders placed or performed during the hospital encounter of 10/14/22 (from the past 48 hour(s))  Basic metabolic panel     Status: Abnormal   Collection Time: 10/15/22  6:06 AM  Result Value Ref Range   Sodium 137 135 - 145 mmol/L   Potassium 3.6 3.5 - 5.1 mmol/L   Chloride 103 98 - 111 mmol/L   CO2 23 22 - 32 mmol/L   Glucose, Bld 81 70 - 99 mg/dL    Comment: Glucose reference range applies only to samples taken after fasting for at least 8 hours.   BUN 19 8 - 23 mg/dL   Creatinine, Ser 5.62 0.61 - 1.24 mg/dL   Calcium 8.7 (L) 8.9 - 10.3 mg/dL   GFR, Estimated >13 >08 mL/min    Comment: (NOTE) Calculated using the CKD-EPI Creatinine Equation (2021)    Anion gap 11 5 - 15    Comment: Performed at [facility], [address], [place], [place] [address]  CBC     Status: None   Collection Time: 10/15/22  6:06 AM  Result Value Ref Range   WBC 5.0 4.0 - 10.5 K/uL   RBC 4.32 4.22 - 5.81 MIL/uL   Hemoglobin 13.3 13.0 - 17.0 g/dL   HCT 69.6 29.5 - 28.4 %   MCV 90.3 80.0 - 100.0 fL   MCH 30.8 26.0 - 34.0 pg   MCHC 34.1 30.0 - 36.0 g/dL   RDW 13.2 44.0 - 10.2 %   Platelets 176 150 - 400 K/uL   nRBC 0.0 0.0 - 0.2 %    Comment: Performed at [facility], [address], [place], [place]  [address]   No results found.  Anti-infectives (From admission, onward)    None        Assessment/Plan Gastric cancer - Patient with adenocarcinoma of the stomach diagnosed 09/2022. He is not a candidate for surgery upfront, and plans to start neoadjuvant chemo next week. He is scheduled for EUS staging with Dr. Meridee Score tomorrow morning. We will plan for diagnostic laparoscopy, open jejunostomy placement, and port-a-cath placement. Discussed with anesthesia and it would be ok to proceed with abdominal surgery tomorrow following his anesthesia for EUS, but will plan to obtain consents prior to his first procedure.   ID - none VTE - lovenox FEN - NPO Foley - none  HTN GERD Depression  I reviewed Consultant oncology notes, hospitalist notes, last 24 h vitals and pain scores, last 48 h intake and output, last 24 h labs and trends, and last 24 h imaging results.   Franne Forts, PA-C [facility] 10/15/2022, 9:32 AM Please see Amion for pager number during day hours 7:00am-4:30pm

## 2022-10-16 ENCOUNTER — Encounter (HOSPITAL_COMMUNITY): Payer: Self-pay | Admitting: Family Medicine

## 2022-10-16 ENCOUNTER — Ambulatory Visit (HOSPITAL_COMMUNITY): Admission: RE | Admit: 2022-10-16 | Payer: Medicare PPO | Source: Home / Self Care | Admitting: Gastroenterology

## 2022-10-16 ENCOUNTER — Inpatient Hospital Stay (HOSPITAL_COMMUNITY): Payer: Medicare PPO | Admitting: Anesthesiology

## 2022-10-16 ENCOUNTER — Inpatient Hospital Stay (HOSPITAL_COMMUNITY): Payer: Medicare PPO

## 2022-10-16 ENCOUNTER — Other Ambulatory Visit: Payer: Self-pay

## 2022-10-16 ENCOUNTER — Encounter (HOSPITAL_COMMUNITY)
Admission: AD | Disposition: A | Discharge status: Home / Self Care | Payer: Self-pay | Source: Ambulatory Visit | Attending: Family Medicine

## 2022-10-16 ENCOUNTER — Encounter: Payer: Medicare PPO | Admitting: Dietician

## 2022-10-16 DIAGNOSIS — K312 Hourglass stricture and stenosis of stomach: Secondary | ICD-10-CM

## 2022-10-16 DIAGNOSIS — R188 Other ascites: Secondary | ICD-10-CM | POA: Diagnosis not present

## 2022-10-16 DIAGNOSIS — C163 Malignant neoplasm of pyloric antrum: Secondary | ICD-10-CM | POA: Diagnosis not present

## 2022-10-16 DIAGNOSIS — K222 Esophageal obstruction: Secondary | ICD-10-CM | POA: Diagnosis not present

## 2022-10-16 DIAGNOSIS — G4733 Obstructive sleep apnea (adult) (pediatric): Secondary | ICD-10-CM | POA: Diagnosis not present

## 2022-10-16 DIAGNOSIS — C169 Malignant neoplasm of stomach, unspecified: Secondary | ICD-10-CM | POA: Diagnosis not present

## 2022-10-16 DIAGNOSIS — I10 Essential (primary) hypertension: Secondary | ICD-10-CM | POA: Diagnosis not present

## 2022-10-16 DIAGNOSIS — K449 Diaphragmatic hernia without obstruction or gangrene: Secondary | ICD-10-CM

## 2022-10-16 DIAGNOSIS — E785 Hyperlipidemia, unspecified: Secondary | ICD-10-CM | POA: Diagnosis not present

## 2022-10-16 DIAGNOSIS — K311 Adult hypertrophic pyloric stenosis: Secondary | ICD-10-CM | POA: Diagnosis not present

## 2022-10-16 HISTORY — PX: LAPAROSCOPY: SHX197

## 2022-10-16 HISTORY — PX: ESOPHAGOGASTRODUODENOSCOPY (EGD) WITH PROPOFOL: SHX5813

## 2022-10-16 HISTORY — PX: BIOPSY: SHX5522

## 2022-10-16 HISTORY — PX: EUS: SHX5427

## 2022-10-16 HISTORY — PX: PORTACATH PLACEMENT: SHX2246

## 2022-10-16 SURGERY — UPPER ENDOSCOPIC ULTRASOUND (EUS) RADIAL
Anesthesia: Monitor Anesthesia Care

## 2022-10-16 SURGERY — LAPAROSCOPY, DIAGNOSTIC
Anesthesia: General

## 2022-10-16 MED ORDER — SUGAMMADEX SODIUM 200 MG/2ML IV SOLN
INTRAVENOUS | Status: DC | PRN
  Administered 2022-10-16: 150 mg via INTRAVENOUS

## 2022-10-16 MED ORDER — LIDOCAINE 2% (20 MG/ML) 5 ML SYRINGE
INTRAMUSCULAR | Status: DC | PRN
  Administered 2022-10-16: 100 mg via INTRAVENOUS

## 2022-10-16 MED ORDER — BOOST / RESOURCE BREEZE PO LIQD CUSTOM: 1.0000 | Freq: Three times a day (TID) | ORAL | Status: DC

## 2022-10-16 MED ORDER — ACETAMINOPHEN 160 MG/5ML PO SOLN: 325.0000 mg | ORAL | Status: DC | PRN

## 2022-10-16 MED ORDER — PANTOPRAZOLE SODIUM 40 MG IV SOLR
40.0000 mg | INTRAVENOUS | Status: DC
  Administered 2022-10-16 – 2022-11-03 (×19): 40 mg via INTRAVENOUS
  Filled 2022-10-16 (×19): qty 10

## 2022-10-16 MED ORDER — HEPARIN SOD (PORK) LOCK FLUSH 100 UNIT/ML IV SOLN
INTRAVENOUS | Status: DC | PRN
  Administered 2022-10-16: 500 [IU]

## 2022-10-16 MED ORDER — ACETAMINOPHEN 10 MG/ML IV SOLN
INTRAVENOUS | Status: DC | PRN
  Administered 2022-10-16: 1000 mg via INTRAVENOUS

## 2022-10-16 MED ORDER — PROPOFOL 500 MG/50ML IV EMUL
INTRAVENOUS | Status: DC | PRN
  Administered 2022-10-16: 150 ug/kg/min via INTRAVENOUS

## 2022-10-16 MED ORDER — BUPIVACAINE-EPINEPHRINE 0.25% -1:200000 IJ SOLN
INTRAMUSCULAR | Status: DC | PRN
  Administered 2022-10-16: 30 mL

## 2022-10-16 MED ORDER — ACETAMINOPHEN 325 MG PO TABS: 325.0000 mg | ORAL_TABLET | ORAL | Status: DC | PRN

## 2022-10-16 MED ORDER — PHENYLEPHRINE 80 MCG/ML (10ML) SYRINGE FOR IV PUSH (FOR BLOOD PRESSURE SUPPORT)
PREFILLED_SYRINGE | INTRAVENOUS | Status: DC | PRN
  Administered 2022-10-16 (×3): 160 ug via INTRAVENOUS

## 2022-10-16 MED ORDER — FENTANYL CITRATE (PF) 100 MCG/2ML IJ SOLN
INTRAMUSCULAR | Status: AC
  Filled 2022-10-16: qty 2

## 2022-10-16 MED ORDER — ONDANSETRON HCL 4 MG/2ML IJ SOLN
INTRAMUSCULAR | Status: AC
  Filled 2022-10-16: qty 2

## 2022-10-16 MED ORDER — 0.9 % SODIUM CHLORIDE (POUR BTL) OPTIME
TOPICAL | Status: DC | PRN
  Administered 2022-10-16: 1000 mL

## 2022-10-16 MED ORDER — ONDANSETRON HCL 4 MG/2ML IJ SOLN
4.0000 mg | Freq: Four times a day (QID) | INTRAMUSCULAR | Status: DC | PRN
  Administered 2022-10-16 – 2022-10-30 (×13): 4 mg via INTRAVENOUS
  Filled 2022-10-16 (×13): qty 2

## 2022-10-16 MED ORDER — LACTATED RINGERS IV SOLN: INTRAVENOUS | Status: DC

## 2022-10-16 MED ORDER — OXYCODONE HCL 5 MG/5ML PO SOLN: 5.0000 mg | Freq: Once | ORAL | Status: AC | PRN

## 2022-10-16 MED ORDER — BUPIVACAINE-EPINEPHRINE 0.25% -1:200000 IJ SOLN
INTRAMUSCULAR | Status: AC
  Filled 2022-10-16: qty 1

## 2022-10-16 MED ORDER — OXYCODONE HCL 5 MG PO TABS
5.0000 mg | ORAL_TABLET | Freq: Once | ORAL | Status: AC | PRN
  Administered 2022-10-16: 5 mg via ORAL

## 2022-10-16 MED ORDER — ACETAMINOPHEN 10 MG/ML IV SOLN
INTRAVENOUS | Status: AC
  Filled 2022-10-16: qty 100

## 2022-10-16 MED ORDER — LIDOCAINE HCL (PF) 2 % IJ SOLN
INTRAMUSCULAR | Status: AC
  Filled 2022-10-16: qty 5

## 2022-10-16 MED ORDER — ROCURONIUM BROMIDE 10 MG/ML (PF) SYRINGE
PREFILLED_SYRINGE | INTRAVENOUS | Status: DC | PRN
  Administered 2022-10-16: 60 mg via INTRAVENOUS
  Administered 2022-10-16: 20 mg via INTRAVENOUS
  Administered 2022-10-16: 10 mg via INTRAVENOUS

## 2022-10-16 MED ORDER — PROPOFOL 10 MG/ML IV BOLUS
INTRAVENOUS | Status: DC | PRN
  Administered 2022-10-16: 120 mg via INTRAVENOUS

## 2022-10-16 MED ORDER — MIDAZOLAM HCL 2 MG/2ML IJ SOLN
INTRAMUSCULAR | Status: DC | PRN
  Administered 2022-10-16: 2 mg via INTRAVENOUS

## 2022-10-16 MED ORDER — MIDAZOLAM HCL 2 MG/2ML IJ SOLN
INTRAMUSCULAR | Status: AC
  Filled 2022-10-16: qty 2

## 2022-10-16 MED ORDER — HEPARIN 6000 UNIT IRRIGATION SOLUTION
Freq: Once | Status: AC
  Administered 2022-10-16: 1
  Filled 2022-10-16: qty 6000

## 2022-10-16 MED ORDER — METHOCARBAMOL 1000 MG/10ML IJ SOLN: 500.0000 mg | Freq: Four times a day (QID) | INTRAVENOUS | Status: DC | PRN

## 2022-10-16 MED ORDER — HEPARIN SOD (PORK) LOCK FLUSH 100 UNIT/ML IV SOLN
INTRAVENOUS | Status: AC
  Filled 2022-10-16: qty 5

## 2022-10-16 MED ORDER — PROPOFOL 10 MG/ML IV BOLUS
INTRAVENOUS | Status: AC
  Filled 2022-10-16: qty 20

## 2022-10-16 MED ORDER — LIDOCAINE HCL (PF) 1 % IJ SOLN
INTRAMUSCULAR | Status: AC
  Filled 2022-10-16: qty 30

## 2022-10-16 MED ORDER — SODIUM CHLORIDE 0.9 % IV SOLN: INTRAVENOUS | Status: AC

## 2022-10-16 MED ORDER — FENTANYL CITRATE PF 50 MCG/ML IJ SOSY: 25.0000 ug | PREFILLED_SYRINGE | INTRAMUSCULAR | Status: DC | PRN

## 2022-10-16 MED ORDER — ONDANSETRON HCL 4 MG/2ML IJ SOLN: 4.0000 mg | Freq: Once | INTRAMUSCULAR | Status: DC | PRN

## 2022-10-16 MED ORDER — MORPHINE SULFATE (PF) 2 MG/ML IV SOLN
2.0000 mg | INTRAVENOUS | Status: DC | PRN
  Administered 2022-10-16: 2 mg via INTRAVENOUS
  Filled 2022-10-16: qty 1

## 2022-10-16 MED ORDER — FENTANYL CITRATE (PF) 100 MCG/2ML IJ SOLN
INTRAMUSCULAR | Status: DC | PRN
  Administered 2022-10-16 (×4): 50 ug via INTRAVENOUS

## 2022-10-16 MED ORDER — MEPERIDINE HCL 50 MG/ML IJ SOLN: 6.2500 mg | INTRAMUSCULAR | Status: DC | PRN

## 2022-10-16 MED ORDER — LIDOCAINE HCL (CARDIAC) PF 100 MG/5ML IV SOSY
PREFILLED_SYRINGE | INTRAVENOUS | Status: DC | PRN
  Administered 2022-10-16: 50 mg via INTRAVENOUS

## 2022-10-16 MED ORDER — OXYCODONE HCL 5 MG PO TABS
5.0000 mg | ORAL_TABLET | ORAL | Status: DC | PRN
  Administered 2022-10-17: 5 mg via ORAL
  Administered 2022-10-21 – 2022-10-23 (×2): 10 mg via ORAL
  Administered 2022-10-25: 5 mg via ORAL
  Filled 2022-10-16: qty 2
  Filled 2022-10-16: qty 1
  Filled 2022-10-16 (×2): qty 2
  Filled 2022-10-16: qty 1
  Filled 2022-10-16: qty 2
  Filled 2022-10-16: qty 1

## 2022-10-16 MED ORDER — ONDANSETRON HCL 4 MG/2ML IJ SOLN
INTRAMUSCULAR | Status: DC | PRN
Start: 2022-10-16 — End: 2022-10-16
  Administered 2022-10-16: 4 mg via INTRAVENOUS

## 2022-10-16 MED ORDER — DEXAMETHASONE SODIUM PHOSPHATE 10 MG/ML IJ SOLN
INTRAMUSCULAR | Status: DC | PRN
  Administered 2022-10-16: 10 mg via INTRAVENOUS

## 2022-10-16 MED ORDER — DEXAMETHASONE SODIUM PHOSPHATE 10 MG/ML IJ SOLN
INTRAMUSCULAR | Status: AC
  Filled 2022-10-16: qty 1

## 2022-10-16 MED ORDER — PROPOFOL 10 MG/ML IV BOLUS
INTRAVENOUS | Status: DC | PRN
  Administered 2022-10-16 (×3): 50 mg via INTRAVENOUS

## 2022-10-16 MED ORDER — OXYCODONE HCL 5 MG PO TABS
ORAL_TABLET | ORAL | Status: AC
  Administered 2022-10-17: 5 mg
  Filled 2022-10-16: qty 1

## 2022-10-16 MED ORDER — PHENYLEPHRINE 80 MCG/ML (10ML) SYRINGE FOR IV PUSH (FOR BLOOD PRESSURE SUPPORT)
PREFILLED_SYRINGE | INTRAVENOUS | Status: AC
  Filled 2022-10-16: qty 10

## 2022-10-16 MED ORDER — LIDOCAINE HCL 1 % IJ SOLN
INTRAMUSCULAR | Status: DC | PRN
  Administered 2022-10-16: 30 mL

## 2022-10-16 SURGICAL SUPPLY — 64 items
ADH SKN CLS APL DERMABOND .7 (GAUZE/BANDAGES/DRESSINGS) ×1
ANTIFOG SOL W/FOAM PAD STRL (MISCELLANEOUS) ×1
APL PRP STRL LF DISP 70% ISPRP (MISCELLANEOUS) ×1
APL SKNCLS STERI-STRIP NONHPOA (GAUZE/BANDAGES/DRESSINGS)
BAG COUNTER SPONGE SURGICOUNT (BAG) IMPLANT
BAG DECANTER FOR FLEXI CONT (MISCELLANEOUS) ×1 IMPLANT
BAG SPNG CNTER NS LX DISP (BAG)
BENZOIN TINCTURE PRP APPL 2/3 (GAUZE/BANDAGES/DRESSINGS) IMPLANT
BLADE SURG 15 STRL LF DISP TIS (BLADE) ×1 IMPLANT
BLADE SURG 15 STRL SS (BLADE) ×1
BLADE SURG SZ11 CARB STEEL (BLADE) ×1 IMPLANT
CHLORAPREP W/TINT 26 (MISCELLANEOUS) ×1 IMPLANT
COVER SURGICAL LIGHT HANDLE (MISCELLANEOUS) ×1 IMPLANT
DERMABOND ADVANCED .7 DNX12 (GAUZE/BANDAGES/DRESSINGS) ×1 IMPLANT
DRAPE C-ARM 42X120 X-RAY (DRAPES) ×1 IMPLANT
DRAPE LAPAROTOMY TRNSV 102X78 (DRAPES) ×1 IMPLANT
DRAPE UTILITY XL STRL (DRAPES) ×1 IMPLANT
DRSG MEPILEX POST OP 4X8 (GAUZE/BANDAGES/DRESSINGS) IMPLANT
DRSG TELFA 3X8 NADH STRL (GAUZE/BANDAGES/DRESSINGS) IMPLANT
DRSG TELFA 4X10 ISLAND STR (GAUZE/BANDAGES/DRESSINGS) IMPLANT
ELECT REM PT RETURN 15FT ADLT (MISCELLANEOUS) ×1 IMPLANT
GAUZE 4X4 16PLY ~~LOC~~+RFID DBL (SPONGE) ×1 IMPLANT
GLOVE BIO SURGEON STRL SZ 6 (GLOVE) ×1 IMPLANT
GLOVE INDICATOR 6.5 STRL GRN (GLOVE) ×2 IMPLANT
GOWN SRG 2XL LVL 4 BRTHBL STRL (GOWNS) ×1 IMPLANT
GOWN STRL NON-REIN 2XL LVL4 (GOWNS) ×1
GOWN STRL REUS W/ TWL XL LVL3 (GOWN DISPOSABLE) ×4 IMPLANT
GOWN STRL REUS W/TWL XL LVL3 (GOWN DISPOSABLE) ×4
IRRIG SUCT STRYKERFLOW 2 WTIP (MISCELLANEOUS)
IRRIGATION SUCT STRKRFLW 2 WTP (MISCELLANEOUS) IMPLANT
J-TUBE MIC 16FX51 UNV ENFIT (TUBING) IMPLANT
KIT BASIN OR (CUSTOM PROCEDURE TRAY) ×1 IMPLANT
KIT PORT POWER 8FR ISP CVUE (Port) IMPLANT
KIT TURNOVER KIT A (KITS) IMPLANT
L-HOOK LAP DISP 36CM (ELECTROSURGICAL) ×1
LHOOK LAP DISP 36CM (ELECTROSURGICAL) IMPLANT
LIGASURE IMPACT 36 18CM CVD LR (INSTRUMENTS) IMPLANT
NDL HYPO 22X1.5 SAFETY MO (MISCELLANEOUS) ×1 IMPLANT
NEEDLE HYPO 22X1.5 SAFETY MO (MISCELLANEOUS) ×1 IMPLANT
PACK BASIC VI WITH GOWN DISP (CUSTOM PROCEDURE TRAY) ×1 IMPLANT
PENCIL SMOKE EVACUATOR (MISCELLANEOUS) ×1 IMPLANT
SET TUBE SMOKE EVAC HIGH FLOW (TUBING) ×1 IMPLANT
SHEARS HARMONIC ACE PLUS 36CM (ENDOMECHANICALS) IMPLANT
SOLUTION ANTFG W/FOAM PAD STRL (MISCELLANEOUS) ×1 IMPLANT
SPIKE FLUID TRANSFER (MISCELLANEOUS) ×1 IMPLANT
STRIP CLOSURE SKIN 1/2X4 (GAUZE/BANDAGES/DRESSINGS) IMPLANT
SUT MNCRL AB 4-0 PS2 18 (SUTURE) ×1 IMPLANT
SUT PDS AB 0 CT1 36 (SUTURE) IMPLANT
SUT PROLENE 2 0 SH DA (SUTURE) ×2 IMPLANT
SUT SILK 3 0 SH CR/8 (SUTURE) IMPLANT
SUT VIC AB 3-0 SH 27 (SUTURE) ×1
SUT VIC AB 3-0 SH 27X BRD (SUTURE) ×1 IMPLANT
SUT VIC AB 4-0 PS2 27 (SUTURE) IMPLANT
SYR 10ML LL (SYRINGE) ×1 IMPLANT
SYR CONTROL 10ML LL (SYRINGE) ×1 IMPLANT
TOWEL OR 17X26 10 PK STRL BLUE (TOWEL DISPOSABLE) ×3 IMPLANT
TOWEL OR NON WOVEN STRL DISP B (DISPOSABLE) ×1 IMPLANT
TRAY FOLEY MTR SLVR 16FR STAT (SET/KITS/TRAYS/PACK) IMPLANT
TRAY LAPAROSCOPIC (CUSTOM PROCEDURE TRAY) ×1 IMPLANT
TROCAR 11X100 Z THREAD (TROCAR) IMPLANT
TROCAR BALLN 12MMX100 BLUNT (TROCAR) ×1 IMPLANT
TROCAR Z-THREAD SLEEVE 11X100 (TROCAR) IMPLANT
TUBE JEJUNAL 16FR ENFIT (TUBING) ×1 IMPLANT
WATER STERILE IRR 1000ML POUR (IV SOLUTION) ×1 IMPLANT

## 2022-10-16 NOTE — TOC Progression Note (Signed)
Transition of Care [facility]) - Progression Note    Patient Details  Name: Steven Wolf MRN: [id number] Date of Birth: September 15, 1955  Transition of Care [facility]) CM/SW Contact  Geni Bers, RN Phone Number: 10/16/2022, 10:49 AM  Clinical Narrative:    Spoke with pt concerning discharge plans.ked for [facility], [facility] was selected and [employer]. Referrals given to [employer], Charity fundraiser and [employer], Charity fundraiser.   Expected Discharge Plan: Home w Home Health Services Barriers to Discharge: No Barriers Identified  Expected Discharge Plan and Services     Post Acute Care Choice: Home Health Living arrangements for the past 2 months: Single Family Home                           HH Arranged: RN HH Agency: [facility] Date [facility] Agency Contacted: 10/16/22 Time HH Agency Contacted: 1047 Representative spoke with at [facility] Agency: Kandee Keen   Social Determinants of Health (SDOH) Interventions SDOH Screenings   Food Insecurity: No Food Insecurity (10/14/2022)  Housing: Low Risk  (10/14/2022)  Transportation Needs: No Transportation Needs (10/14/2022)  Utilities: Not At Risk (10/14/2022)  Depression (PHQ2-9): Medium Risk (08/13/2022)  Financial Resource Strain: Low Risk  (08/12/2022)  Physical Activity: Sufficiently Active (08/12/2022)  Social Connections: Unknown (08/12/2022)  Stress: Stress Concern Present (08/12/2022)  Tobacco Use: Low Risk  (10/16/2022)    Readmission Risk Interventions     No data to display

## 2022-10-16 NOTE — Transfer of Care (Signed)
Immediate Anesthesia Transfer of Care Note  Patient: Steven Wolf  Procedure(s) Performed: DIAGNOSTIC LAPAROSCOPY, OPEN J TUBE PLACEMENT INSERTION PORT-A-CATH  Patient Location: PACU  Anesthesia Type:General  Level of Consciousness: drowsy and patient cooperative  Airway & Oxygen Therapy: Patient Spontanous Breathing and Patient connected to face mask oxygen  Post-op Assessment: Report given to RN and Post -op Vital signs reviewed and stable  Post vital signs: Reviewed and stable  Last Vitals:  Vitals Value Taken Time  BP 130/84 10/16/22 1645  Temp    Pulse 81 10/16/22 1647  Resp 16 10/16/22 1647  SpO2 100 % 10/16/22 1647  Vitals shown include unfiled device data.  Last Pain:  Vitals:   10/16/22 0910  TempSrc:   PainSc: 0-No pain         Complications: No notable events documented.

## 2022-10-16 NOTE — Progress Notes (Addendum)
Steven Wolf   DOB:1956/03/09   ZO#:[id number]   JWJ#:[id number]  Medical oncology follow-up note.  Subjective: Patient is well-known to me, was admitted to [facility] yesterday for procedures.  He underwent EUS this morning by Dr. Meridee Score, and exploratory laparoscopy, port placement and J feeding tube replacement but Dr. Donell Beers this afternoon.  He was still little bit drowsy from the surgery.  I talked to his wife and sister-in-law at the bedside.   Objective:  Vitals:   10/16/22 1745 10/16/22 1900  BP: (!) 135/90 (!) 123/91  Pulse: 84 88  Resp: 12 14  Temp: 97.7 F (36.5 C) 97.8 F (36.6 C)  SpO2: 97% 96%    Body mass index is 21.62 kg/m.  Intake/Output Summary (Last 24 hours) at 10/16/2022 2103 Last data filed at 10/16/2022 1636 Gross per 24 hour  Intake 2499.83 ml  Output 225 ml  Net 2274.83 ml     Sclerae unicteric  MSK no focal spinal tenderness, no peripheral edema  Neuro nonfocal    CBG (last 3)  No results for input(s): "GLUCAP" in the last 72 hours.   Labs:   Urine Studies No results for input(s): "UHGB", "CRYS" in the last 72 hours.  Invalid input(s): "UACOL", "UAPR", "USPG", "UPH", "UTP", "UGL", "UKET", "UBIL", "UNIT", "UROB", "ULEU", "UEPI", "UWBC", "URBC", "UBAC", "CAST", "UCOM", "BILUA"  Basic Metabolic Panel: Recent Labs  Lab 10/14/22 1320 10/15/22 0606  NA 138 137  K 4.1 3.6  CL 102 103  CO2 25 23  GLUCOSE 75 81  BUN 20 19  CREATININE 1.01 0.96  CALCIUM 9.8 8.7*   GFR Estimated Creatinine Clearance: 74.3 mL/min (by C-G formula based on SCr of 0.96 mg/dL). Liver Function Tests: Recent Labs  Lab 10/14/22 1320  AST 17  ALT 28  ALKPHOS 56  BILITOT 0.6  PROT 6.5  ALBUMIN 4.1   No results for input(s): "LIPASE", "AMYLASE" in the last 168 hours. No results for input(s): "AMMONIA" in the last 168 hours. Coagulation profile No results for input(s): "INR", "PROTIME" in the last 168 hours.  CBC: Recent Labs  Lab  10/14/22 1320 10/15/22 0606  WBC 6.3 5.0  NEUTROABS 4.4  --   HGB 14.7 13.3  HCT 42.1 39.0  MCV 87.3 90.3  PLT 205 176   Cardiac Enzymes: No results for input(s): "CKTOTAL", "CKMB", "CKMBINDEX", "TROPONINI" in the last 168 hours. BNP: Invalid input(s): "POCBNP" CBG: No results for input(s): "GLUCAP" in the last 168 hours. D-Dimer No results for input(s): "DDIMER" in the last 72 hours. Hgb A1c No results for input(s): "HGBA1C" in the last 72 hours. Lipid Profile No results for input(s): "CHOL", "HDL", "LDLCALC", "TRIG", "CHOLHDL", "LDLDIRECT" in the last 72 hours. Thyroid function studies No results for input(s): "TSH", "T4TOTAL", "T3FREE", "THYROIDAB" in the last 72 hours.  Invalid input(s): "FREET3" Anemia work up No results for input(s): "VITAMINB12", "FOLATE", "FERRITIN", "TIBC", "IRON", "RETICCTPCT" in the last 72 hours. Microbiology No results found for this or any previous visit (from the past 240 hour(s)).    Studies:  DG CHEST PORT 1 VIEW  Result Date: 10/16/2022 CLINICAL DATA:  Port-A-Cath in place EXAM: PORTABLE CHEST 1 VIEW COMPARISON:  Chest x-ray September 03, 2013 FINDINGS: Curvilinear lucencies along the margins of the mediastinum. No visible pneumothorax on this semi erect radiograph. No consolidation. Cardiomediastinal silhouette is within normal limits. Left subclavian approach Port-A-Cath with the tip projecting at the superior right atrium IMPRESSION: 1. Interval development of curvilinear lucencies along the margins of the mediastinum,  which are are suspicious for pneumomediastinum. Less likely pneumopericardium. No visible pneumothorax on this semi erect radiograph. CT of the chest could further characterize if clinically warranted. 2. Left subclavian approach Port-A-Cath with the tip projecting at the superior right atrium. These results will be called to the ordering clinician or representative by the Radiologist Assistant, and communication documented in the  PACS or [employer]. Electronically Signed   By: Feliberto Harts M.D.   On: 10/16/2022 17:54   DG C-Arm 1-60 Min-No Report  Result Date: 10/16/2022 Fluoroscopy was utilized by the requesting physician.  No radiographic interpretation.    Assessment: 67 y.o. 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 -I reviewed Dr. Arita Miss operative note today.  Exploratory laparoscopy showed multiple nodules on the falciform and along the diaphragm, concerning for peritoneal metastasis.  Biopsy was obtained.  He also has small amount of ascites, cytology was sent.  He had a port placement and J feeding tube placement. -I explained to patient and his family that if the peritoneal nodule biopsy showed metastatic gastric cancer, then this is stage IV cancer and not curable. -Patient's wife has requested PET scan to be done in the hospital.  I recommend waiting for peritoneal nodule biopsy, if that is positive for malignancy, then PET scan may not needed for staging purpose. If biopsy and cytology negative, then I will obtain PET scan after his hospital discharge. 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  I have not discussed that with patient. -I plan to see him back on Friday when I have the biopsy results back. -I answered multiple questions from his wife and sister-in-law, I spent a total of 50 minutes for his visit today.   [name], MD 10/16/2022

## 2022-10-16 NOTE — Op Note (Signed)
PREOPERATIVE DIAGNOSIS:  gastric cancer, [id number], partial gastric outlet obstruction     POSTOPERATIVE DIAGNOSIS:  Same     PROCEDURE: left subclavian port placement, Bard ClearVue Power Port, MRI safe, 8-French, diagnostic laparoscopy, 16 Fr feeding jejunostomy tube.       SURGEON:  Almond Lint, MD   ASSISTANT:  Trixie Deis, PA-C     ANESTHESIA:  General   FINDINGS:  Good venous return, easy flush, and tip of the catheter and SVC 26 cm. Nodules suspicious for carcinomatosis, sent for pathology.       SPECIMEN:  None.      ESTIMATED BLOOD LOSS:  Minimal.      COMPLICATIONS:  None known.      PROCEDURE:  Pt was identified in the holding area and taken to   the operating room, where patient was placed supine on the operating room   table.  General anesthesia was induced.  Patient's arms were tucked and the upper   chest and neck were prepped and draped in sterile fashion.  Time-out was   performed according to the surgical safety check list.  When all was   correct, we continued.   Local anesthetic was administered over this   area at the angle of the clavicle.  The vein was accessed with 1 pass(es) of the needle. There was good venous return and the wire passed easily with no ectopy.   Fluoroscopy was used to confirm that the wire was in the vena cava.      The patient was placed back level and the area for the pocket was anethetized   with local anesthetic.  A 3-cm transverse incision was made with a #15   blade.  Cautery was used to divide the subcutaneous tissues down to the   pectoralis muscle.  An [employer] retractor was used to elevate the skin   while a pocket was created on top of the pectoralis fascia.  The port   was placed into the pocket to confirm that it was of adequate size.  The   catheter was preattached to the port.  The port was then secured to the   pectoralis fascia with four 2-0 Prolene sutures.  These were clamped and   not tied down yet.    The  catheter was tunneled through to the wire exit   site.  The catheter was placed along the wire to determine what length it should be to be in the SVC.  The catheter was cut at 26 cm.  The tunneler sheath and dilator were passed over the wire and the dilator and wire were removed.  The catheter was advanced through the tunneler sheath and the tunneler sheath was pulled away.  Care was taken to keep the catheter in the tunneler sheath as this occurred. This was advanced and the tunneler sheath was removed.  There was good venous   return and easy flush of the catheter.  The Prolene sutures were tied   down to the pectoral fascia.  The skin was reapproximated using 3-0   Vicryl interrupted deep dermal sutures.    Fluoroscopy was used to re-confirm good position of the catheter.  The skin   was then closed using 4-0 Monocryl in a subcuticular fashion.  The port was flushed with concentrated heparin flush as well.  The wounds were then cleaned, dried, and dressed with Dermabond.   Needle, sponge, and instrument counts were correct.   Attention was then directed to the abdomen.  The patient was placed into reverse Trendelenburg position and rotated to the right.  A 5 mm Optiview trocar was used to access the abdomen through an 8 mm incision in the left subcostal location.  Pneumoperitoneum was achieved to pressure 15 mmHg.  An additional trocar was placed in the midline.  The abdomen was examined.    There were multiple nodules on the falciform and along the diaphragm.  Multiple of these were biopsied and sent together for specimen as peritoneal nodules.  There also was some ascites in the right upper quadrant along the border of the liver.  This was sent as peritoneal washings.  Hemostasis was achieved at the biopsy sites with the cautery.  The J-tube portion was then performed.  A small mini laparotomy was made in the midline above the umbilicus.  This was approximately 7 to 8 cm in length.  #10 blade was  used to make the incision and the cautery used to divide the subcutaneous tissues.  The fascia was entered midline with the cautery.  The edges were then elevated and the abdomen examined.  The LigaSure was required to ligate some omental adhesions that were down in the pelvis.  The small bowel was then able to be partially eviscerated.  The bowel was run to make sure the direction was appropriate.  The ligament of Treitz was located.  30 cm beyond this was identified for an appropriate site for jejunostomy tube.  A 3-0 silk pursestring suture was placed at this location.  The J-tube was passed through the abdominal wall on the left mid to upper abdomen.  Several additional holes were placed in the J-tube to minimize clogging.  The balloon was tested.  The bowel was opened at the site of the pursestring suture and the J-tube threaded into the small intestine distally.  The tube was then Witzeled.  Interrupted 3-0 silk sutures were used in the cardinal directions to secure the J-tube to the abdominal wall at the site of exit.  Additional 3 oh silks were placed vertically in order to secure the jejunum and minimize risk of volvulus.  The fascia was then closed using running 0 PDS suture.  The skin was irrigated.  The skin was closed using interrupted 3-0 Vicryl and running 4-0 Monocryl subcuticular suture.  The wounds were then cleaned, dried, and dressed with Dermabond.  The flange of the J-tube was secured with interrupted 2-0 nylon sutures.  The patient was then allowed to emerge from anesthesia and was taken the PACU in stable condition.  Needle, sponge, and instrument counts were correct x 2.      Almond Lint, MD

## 2022-10-16 NOTE — Anesthesia Postprocedure Evaluation (Signed)
Anesthesia Post Note  Patient: ONUR MORI  Procedure(s) Performed: UPPER ENDOSCOPIC ULTRASOUND (EUS) RADIAL ESOPHAGOGASTRODUODENOSCOPY (EGD) WITH PROPOFOL BIOPSY     Patient location during evaluation: PACU Anesthesia Type: MAC Level of consciousness: awake and alert Pain management: pain level controlled Vital Signs Assessment: post-procedure vital signs reviewed and stable Respiratory status: spontaneous breathing, nonlabored ventilation, respiratory function stable and patient connected to nasal cannula oxygen Cardiovascular status: stable and blood pressure returned to baseline Postop Assessment: no apparent nausea or vomiting Anesthetic complications: no  No notable events documented.  Last Vitals:  Vitals:   10/16/22 0915 10/16/22 1106  BP:  (!) 141/94  Pulse: 76 66  Resp: 16 18  Temp:    SpO2: 98% 95%    Last Pain:  Vitals:   10/16/22 0910  TempSrc:   PainSc: 0-No pain                 Lea Walbert S

## 2022-10-16 NOTE — H&P (Signed)
GASTROENTEROLOGY PROCEDURE H&P NOTE   Primary Care Physician: Pincus Sanes, MD  HPI: Steven Wolf is a 67 y.o. male who presents for EGD/EUS for newly diagnosed gastric cancer staging.  Past Medical History:  Diagnosis Date   Anxiety    Benign prostatic hypertrophy    Diverticulosis of colon    GERD (gastroesophageal reflux disease)    Sullivan Lone syndrome    History of kidney stones    Hypertension    Microscopic hematuria    Motion sickness    ocean boats, curvy roads   Nephrolithiasis     X 2; Dr Aldean Ast   Sleep apnea    USES CPAP   Syncope 02/2007   NOCTURNAL POST MICTURTION   Urticaria    Past Surgical History:  Procedure Laterality Date   APPENDECTOMY     COLONOSCOPY  2010   NORMAL(pt states 2 other colonoscopies in the past)   COLONOSCOPY W/ POLYPECTOMY  2002   Tics 2005 & 2010; due 2020, Dr Jarold Motto   ESOPHAGOGASTRODUODENOSCOPY (EGD) WITH PROPOFOL N/A 09/30/2022   Procedure: ESOPHAGOGASTRODUODENOSCOPY (EGD) WITH BIOPSY;  Surgeon: Midge Minium, MD;  Location: [facility];  Service: Endoscopy;  Laterality: N/A;   LIVER BIOPSY     X 1 for elevated LFTs with NEGATIVE  hepatitis serologies   TONSILLECTOMY     TONSILLECTOMY     PT STATES HAS HAD IT TWICE   UPPER GASTROINTESTINAL ENDOSCOPY  1995   Current Facility-Administered Medications  Medication Dose Route Frequency Provider Last Rate Last Admin   0.9 %  sodium chloride infusion   Intravenous Continuous Pahwani, Ravi, MD 100 mL/hr at 10/15/22 1723 New Bag at 10/15/22 1723   [MAR Hold] acetaminophen (TYLENOL) tablet 650 mg  650 mg Oral Q6H PRN Kirby Crigler, Mir M, MD       Or   Mitzi Hansen Hold] acetaminophen (TYLENOL) suppository 650 mg  650 mg Rectal Q6H PRN Kirby Crigler, Mir M, MD       [MAR Hold] albuterol (PROVENTIL) (2.5 MG/3ML) 0.083% nebulizer solution 2.5 mg  2.5 mg Nebulization Q2H PRN Kirby Crigler, Mir M, MD       [MAR Hold] bisacodyl (DULCOLAX) suppository 10 mg  10 mg Rectal Daily PRN Meredith Pel, NP   10 mg at 10/15/22 1459   [MAR Hold] cefoTEtan (CEFOTAN) 2 g in sodium chloride 0.9 % 100 mL IVPB  2 g Intravenous On Call to OR Franne Forts, PA-C       [MAR Hold] FLUoxetine (PROZAC) capsule 20 mg  20 mg Oral Daily Kirby Crigler, Mir M, MD   20 mg at 10/15/22 1033   [MAR Hold] irbesartan (AVAPRO) tablet 75 mg  75 mg Oral Daily Kirby Crigler, Mir M, MD   75 mg at 10/15/22 2255   lactated ringers infusion   Intravenous Continuous Mansouraty, Netty Starring., MD 10 mL/hr at 10/16/22 0736 New Bag at 10/16/22 0736   [MAR Hold] pantoprazole (PROTONIX) EC tablet 40 mg  40 mg Oral Daily Kirby Crigler, Mir M, MD   40 mg at 10/15/22 1033   [MAR Hold] polyethylene glycol (MIRALAX / GLYCOLAX) packet 17 g  17 g Oral Daily Meredith Pel, NP   17 g at 10/15/22 1440   [MAR Hold] traZODone (DESYREL) tablet 25 mg  25 mg Oral QHS PRN Kirby Crigler, Mir M, MD        Current Facility-Administered Medications:    0.9 %  sodium chloride infusion, , Intravenous, Continuous, Pahwani, Ravi, MD, Last Rate: 100 mL/hr at  10/15/22 1723, New Bag at 10/15/22 1723   [MAR Hold] acetaminophen (TYLENOL) tablet 650 mg, 650 mg, Oral, Q6H PRN **OR** [MAR Hold] acetaminophen (TYLENOL) suppository 650 mg, 650 mg, Rectal, Q6H PRN, Kirby Crigler, Mir M, MD   [MAR Hold] albuterol (PROVENTIL) (2.5 MG/3ML) 0.083% nebulizer solution 2.5 mg, 2.5 mg, Nebulization, Q2H PRN, Kirby Crigler, Mir M, MD   [facility] Hold] bisacodyl (DULCOLAX) suppository 10 mg, 10 mg, Rectal, Daily PRN, Meredith Pel, NP, 10 mg at 10/15/22 1459   [MAR Hold] cefoTEtan (CEFOTAN) 2 g in sodium chloride 0.9 % 100 mL IVPB, 2 g, Intravenous, On Call to OR, Meuth, Brooke A, PA-C   [MAR Hold] FLUoxetine (PROZAC) capsule 20 mg, 20 mg, Oral, Daily, Kirby Crigler, Mir M, MD, 20 mg at 10/15/22 1033   [MAR Hold] irbesartan (AVAPRO) tablet 75 mg, 75 mg, Oral, Daily, Kirby Crigler, Mir M, MD, 75 mg at 10/15/22 2255   lactated ringers infusion, , Intravenous, Continuous, Mansouraty, Netty Starring., MD, Last Rate: 10 mL/hr at 10/16/22 0736, New Bag at 10/16/22 0736   [MAR Hold] pantoprazole (PROTONIX) EC tablet 40 mg, 40 mg, Oral, Daily, Kirby Crigler, Mir M, MD, 40 mg at 10/15/22 1033   [MAR Hold] polyethylene glycol (MIRALAX / GLYCOLAX) packet 17 g, 17 g, Oral, Daily, Willette Cluster M, NP, 17 g at 10/15/22 1440   [MAR Hold] traZODone (DESYREL) tablet 25 mg, 25 mg, Oral, QHS PRN, Kirby Crigler, Mir M, MD Allergies  Allergen Reactions   Ramipril Other (See Comments)    Elevated liver enzymes   Penicillins Other (See Comments)    ? Reaction (as child)   Family History  Problem Relation Age of Onset   Breast cancer Mother    Rheumatologic disease Mother        RA   Heart disease Father        PCV;MI ? 74   Polycythemia Father    Leukemia Paternal Uncle    Polycythemia Paternal Uncle    Heart attack Paternal Grandfather        > 50   Stroke Maternal Grandmother        . 65   Sleep apnea Sister    Diabetes Neg Hx    Colon cancer Neg Hx    Colon polyps Neg Hx    Esophageal cancer Neg Hx    Stomach cancer Neg Hx    Rectal cancer Neg Hx    Social History   Socioeconomic History   Marital status: Married    Spouse name: Not on file   Number of children: 0   Years of education: Not on file   Highest education level: Master's degree (e.g., MA, MS, MEng, MEd, MSW, MBA)  Occupational History   Not on file  Tobacco Use   Smoking status: Never   Smokeless tobacco: Never   Tobacco comments:    Second hand smoke  Vaping Use   Vaping status: Never Used  Substance and Sexual Activity   Alcohol use: No   Drug use: No   Sexual activity: Not on file  Other Topics Concern   Not on file  Social History Narrative   Right Handed   Lives in a two story home    Drinks some caffeine    Social Determinants of Health   Financial Resource Strain: Low Risk  (08/12/2022)   Overall Financial Resource Strain (CARDIA)    Difficulty of Paying Living Expenses: Not hard at all  Food  Insecurity: No Food Insecurity (10/14/2022)   Hunger Vital  Sign    Worried About Programme researcher, broadcasting/film/video in the Last Year: Never true    Ran Out of Food in the Last Year: Never true  Transportation Needs: No Transportation Needs (10/14/2022)   PRAPARE - Administrator, Civil Service (Medical): No    Lack of Transportation (Non-Medical): No  Physical Activity: Sufficiently Active (08/12/2022)   Exercise Vital Sign    Days of Exercise per Week: 6 days    Minutes of Exercise per Session: 50 min  Stress: Stress Concern Present (08/12/2022)   [employer] of Occupational Health - Occupational Stress Questionnaire    Feeling of Stress : To some extent  Social Connections: Unknown (08/12/2022)   Social Connection and Isolation Panel [NHANES]    Frequency of Communication with Friends and Family: Once a week    Frequency of Social Gatherings with Friends and Family: Patient declined    Attends Religious Services: Never    Database administrator or Organizations: Not on file    Attends Banker Meetings: Not on file    Marital Status: Married  Intimate Partner Violence: Not At Risk (10/14/2022)   Humiliation, Afraid, Rape, and Kick questionnaire    Fear of Current or Ex-Partner: No    Emotionally Abused: No    Physically Abused: No    Sexually Abused: No    Physical Exam: Today's Vitals   10/15/22 2020 10/15/22 2110 10/16/22 0633 10/16/22 0723  BP:  (!) 126/97 124/83 (!) 138/93  Pulse:  70 69 74  Resp:  20 18 16   Temp:  97.7 F (36.5 C) 97.9 F (36.6 C) (!) 97.5 F (36.4 C)  TempSrc:  Oral Oral Temporal  SpO2:  96% 97% 98%  Weight:    72.3 kg  Height:    6' (1.829 m)  PainSc: 0-No pain   0-No pain   Body mass index is 21.62 kg/m. GEN: NAD EYE: Sclerae anicteric ENT: MMM CV: Non-tachycardic GI: Soft, NT/ND NEURO:  Alert & Oriented x 3  Lab Results: Recent Labs    10/14/22 1320 10/15/22 0606  WBC 6.3 5.0  HGB 14.7 13.3  HCT 42.1 39.0  PLT 205 176    BMET Recent Labs    10/14/22 1320 10/15/22 0606  NA 138 137  K 4.1 3.6  CL 102 103  CO2 25 23  GLUCOSE 75 81  BUN 20 19  CREATININE 1.01 0.96  CALCIUM 9.8 8.7*   LFT Recent Labs    10/14/22 1320  PROT 6.5  ALBUMIN 4.1  AST 17  ALT 28  ALKPHOS 56  BILITOT 0.6   PT/INR No results for input(s): "LABPROT", "INR" in the last 72 hours.   Impression / Plan: This is a 67 y.o.male who presents for EGD/EUS for newly diagnosed gastric cancer staging.  The risks of an EUS including intestinal perforation, bleeding, infection, aspiration, and medication effects were discussed as was the possibility it may not give a definitive diagnosis if a biopsy is performed.  When a biopsy of the pancreas is done as part of the EUS, there is an additional risk of pancreatitis at the rate of about 1-2%.  It was explained that procedure related pancreatitis is typically mild, although it can be severe and even life threatening, which is why we do not perform random pancreatic biopsies and only biopsy a lesion/area we feel is concerning enough to warrant the risk.   The risks and benefits of endoscopic evaluation/treatment were discussed with the  patient and/or family; these include but are not limited to the risk of perforation, infection, bleeding, missed lesions, lack of diagnosis, severe illness requiring hospitalization, as well as anesthesia and sedation related illnesses.  The patient's history has been reviewed, patient examined, no change in status, and deemed stable for procedure.  The patient and/or family is agreeable to proceed.    Corliss Parish, MD [facility] Office # [phone]

## 2022-10-16 NOTE — Anesthesia Preprocedure Evaluation (Signed)
Anesthesia Evaluation  Patient identified by MRN, date of birth, ID band Patient awake    Reviewed: Allergy & Precautions, H&P , NPO status , Patient's Chart, lab work & pertinent test results  History of Anesthesia Complications (+) PONV and history of anesthetic complications  Airway Mallampati: II  TM Distance: >3 FB Neck ROM: Full    Dental no notable dental hx.    Pulmonary sleep apnea and Continuous Positive Airway Pressure Ventilation    Pulmonary exam normal breath sounds clear to auscultation       Cardiovascular hypertension, negative cardio ROS Normal cardiovascular exam Rhythm:Regular Rate:Normal     Neuro/Psych   Anxiety     negative neurological ROS  negative psych ROS   GI/Hepatic Neg liver ROS,GERD  ,,  Endo/Other  negative endocrine ROS    Renal/GU negative Renal ROS  negative genitourinary   Musculoskeletal negative musculoskeletal ROS (+)    Abdominal   Peds negative pediatric ROS (+)  Hematology negative hematology ROS (+)   Anesthesia Other Findings   Reproductive/Obstetrics negative OB ROS                             Anesthesia Physical Anesthesia Plan  ASA: 3  Anesthesia Plan: General   Post-op Pain Management: Minimal or no pain anticipated, Toradol IV (intra-op)* and Ofirmev IV (intra-op)*   Induction: Intravenous and Cricoid pressure planned  PONV Risk Score and Plan: 1 and Treatment may vary due to age or medical condition and Dexamethasone  Airway Management Planned: Oral ETT  Additional Equipment: None  Intra-op Plan:   Post-operative Plan: Extubation in OR  Informed Consent: I have reviewed the patients History and Physical, chart, labs and discussed the procedure including the risks, benefits and alternatives for the proposed anesthesia with the patient or authorized representative who has indicated his/her understanding and acceptance.      Dental advisory given  Plan Discussed with: CRNA, Surgeon and Anesthesiologist  Anesthesia Plan Comments: (  )       Anesthesia Quick Evaluation

## 2022-10-16 NOTE — Anesthesia Procedure Notes (Signed)
Procedure Name: Intubation Date/Time: 10/16/2022 2:20 PM  Performed by: Florene Route, CRNAPre-anesthesia Checklist: Patient identified, Emergency Drugs available, Suction available and Patient being monitored Patient Re-evaluated:Patient Re-evaluated prior to induction Oxygen Delivery Method: Circle system utilized Preoxygenation: Pre-oxygenation with 100% oxygen Induction Type: IV induction Ventilation: Mask ventilation without difficulty Laryngoscope Size: Miller and 3 Grade View: Grade II Tube type: Oral Tube size: 8.0 mm Number of attempts: 1 Airway Equipment and Method: Stylet and Oral airway Placement Confirmation: ETT inserted through vocal cords under direct vision, positive ETCO2 and breath sounds checked- equal and bilateral Secured at: 22 cm Tube secured with: Tape Dental Injury: Teeth and Oropharynx as per pre-operative assessment

## 2022-10-16 NOTE — Progress Notes (Signed)
The proposed treatment discussed in conference is for discussion purpose only and is not a binding recommendation.  The patients have not been physically examined, or presented with their treatment options.  Therefore, final treatment plans cannot be decided.  

## 2022-10-16 NOTE — Op Note (Signed)
[facility] Patient Name: Steven Wolf Procedure Date: 10/16/2022 MRN: [id number] Attending MD: Corliss Parish , MD, [id number] Date of Birth: 12/19/1955 CSN: [id number] Age: 67 Admit Type: Inpatient Procedure:                Upper EUS Indications:              Pre-treatment staging of gastric adenocarcinoma,                            Nausea with vomiting, Weight loss Providers:                Corliss Parish, MD, Fransisca Connors, Priscella Mann, Technician Referring MD:             [name], Almond Lint MD, MD, Midge Minium MD, MD Medicines:                Monitored Anesthesia Care Complications:            No immediate complications. Estimated Blood Loss:     Estimated blood loss was minimal. Procedure:                Pre-Anesthesia Assessment:                           - Prior to the procedure, a History and Physical                            was performed, and patient medications and                            allergies were reviewed. The patient's tolerance of                            previous anesthesia was also reviewed. The risks                            and benefits of the procedure and the sedation                            options and risks were discussed with the patient.                            All questions were answered, and informed consent                            was obtained. Prior Anticoagulants: The patient has                            taken no anticoagulant or antiplatelet agents. ASA                            Grade Assessment: II - A patient with mild systemic  disease. After reviewing the risks and benefits,                            the patient was deemed in satisfactory condition to                            undergo the procedure.                           After obtaining informed consent, the endoscope was                            passed under direct vision.  Throughout the                            procedure, the patient's blood pressure, pulse, and                            oxygen saturations were monitored continuously. The                            [id number]-H190 ([id number]) Olympus endoscope was introduced                            through the mouth, and advanced to the second part                            of duodenum. The GF-UE190-[id number] ([id number]) Olympus                            radial ultrasound scope was introduced through the                            mouth, and advanced to the stomach for ultrasound                            examination from the esophagus and stomach. The                            upper EUS was accomplished without difficulty. The                            patient tolerated the procedure. Scope In: Scope Out: Findings:      ENDOSCOPIC FINDING: :      No gross lesions were noted in the entire esophagus.      A non-obstructing Schatzki ring was found at the gastroesophageal       junction.      The Z-line was irregular and was found 42 cm from the incisors.      A 2 cm hiatal hernia was present.      Retained fluid was found in the entire examined stomach. Suction via       Endoscope was performed.      Diffuse severe mucosal changes characterized by congestion, friability       (with contact bleeding),  granularity, scalloping and altered texture       were found in the cardia, on the greater curvature of the gastric body,       on the lesser curvature of the gastric body, at the incisura and in the       gastric antrum. Interestingly, the fundus itself looked to not have this       type of mucosal enhancement. To try to discern whether this was truly a       linnitis plastica pattern of malignancy I did extensive biopsies today.       Biopsies were taken with a cold forceps for histology from the cardia       and fundus. Biopsies were taken with a cold forceps for histology from       the greater curve and lesser  curve of the body. Biopsies were taken with       a cold forceps for histology from the incisura/antrum.      Transitioning from the mucosal changes noted above, a large,       infiltrative, sessile and ulcerated, circumferential mass with oozing       bleeding and stigmata of recent bleeding was found at the incisura and       in the gastric antrum (as noted above I took biopsies of this area).      This mass let to a malignant-appearing, intrinsic moderate stenosis was       found in the gastric antrum. This was traversed with gentle pressure of       the adult endoscope (thus approximately 9 to 10 mm in diameter) and       spanned approximately 4 cm of length within the stomach.      There appeared to be normal mucosa was found in the prepyloric region of       the stomach and at the pylorus (significantly different from the       previously noted mucosal changes).      No gross lesions were noted in the duodenal bulb, in the first portion       of the duodenum and in the second portion of the duodenum.      ENDOSONOGRAPHIC FINDING: :      Diffuse wall thickening was visualized endosonographically in the cardia       of the stomach, in the greater curve of the stomach and in the lesser       curve of the stomach. This appeared to primarily be due to thickening       within the deep mucosa (Layer 2) and submucosa (Layer 3). Within the       cardia the wall measures 12.2 mm in diameter. Within the gastric body       the wall measures 11.7 mm.      A hypoechoic circumferential mass was identified endosonographically in       the incisura/antrum of the stomach. The mass begins at 18 cm from the       gastroesophageal junction and measured 42 mm in maximal cross-sectional       diameter. The outer margins were irregular. There was sonographic       evidence suggesting invasion into and through the muscularis propria       (Layer 4) and into the serosa (Layer 5). An intact interface was seen        between the mass and the left adrenal and celiac trunk suggesting a lack  of invasion.      Endosonographic imaging in the visualized portion of the liver showed no       mass-lesions.      No malignant-appearing lymph nodes were visualized in the celiac region       (level 20) and perigastric region.      A moderate amount of fluid, visualized as an anechoic feature, was found       in the peritoneal cavity.      The celiac region was visualized. Impression:               EGD impression:                           - No gross lesions in the entire esophagus.                            Non-obstructing Schatzki ring. Z-line irregular, 42                            cm from the incisors.                           - 2 cm hiatal hernia.                           - Retained gastric fluid - 450 mL removed.                           - Congested, friable (with contact bleeding),                            granular, scalloped and texture changed mucosa in                            the cardia, greater curvature of the gastric body,                            lesser curvature of the gastric body, incisura and                            antrum. Biopsied as noted above from different                            regions to rule in/out linnitis.                           - Malignant gastric tumor at the incisura/gastric                            antrum. Gastric stenosis was found as a result of                            this (9 mm in diameter and at least 4 cm in length).                           -  Normal mucosa was found in the prepyloric region                            and in the pylorus.                           - No gross lesions in the duodenal bulb, in the                            first portion of the duodenum and in the second                            portion of the duodenum.                           EUS Impression:                           - Wall thickening was seen in the  cardia of the                            stomach, in the greater curve of the stomach and in                            the lesser curve of the stomach. The thickening                            appeared to be primarily within the deep mucosa                            (Layer 2) and submucosa (Layer 3).                           - A mass was found in the incisura/antrum of the                            stomach. A tissue diagnosis was obtained prior to                            this exam. This is consistent with adenocarcinoma.                            This was staged T3 N0 Mx by endosonographic                            criteria. I could not visualize the previously                            noted lymph nodes on imaging to suggest concern for                            underlying metastatic disease. However, with the  ascites that is present, this needs some sampling                            at some point to ensure that a metastatic process                            is not occuring. Moderate Sedation:      Not Applicable - Patient had care per Anesthesia. Recommendation:           - The patient will be observed post-procedure,                            until all discharge criteria are met.                           - Return patient to hospital ward for ongoing care.                           - Observe patient's clinical course.                           - Await path results.                           - Agree with consideration of J-tube feeding. If                            this is placed, surgically, consider sampling of                            ascites fluid to ensure metastatic disease is not                            found.                           - Enteral stenting could be possible, though I                            would have concerns in the setting of this process                            that looks to potentially be linnitis whether  the                            patient's motility may be predisposed. Though                            technically could be possible.                           - The findings and recommendations were discussed                            with the patient.                           -  The findings and recommendations were discussed                            with the patient's family.                           - The findings and recommendations were discussed                            with the referring physician. Procedure Code(s):        --- Professional ---                           [id number], Esophagogastroduodenoscopy, flexible,                            transoral; with endoscopic ultrasound examination                            limited to the esophagus, stomach or duodenum, and                            adjacent structures                           43239, Esophagogastroduodenoscopy, flexible,                            transoral; with biopsy, single or multiple Diagnosis Code(s):        --- Professional ---                           K22.2, Esophageal obstruction                           K22.89, Other specified disease of esophagus                           K44.9, Diaphragmatic hernia without obstruction or                            gangrene                           K92.2, Gastrointestinal hemorrhage, unspecified                           K31.89, Other diseases of stomach and duodenum                           C16.8, Malignant neoplasm of overlapping sites of                            stomach                           C16.3, Malignant neoplasm of pyloric antrum  I89.9, Noninfective disorder of lymphatic vessels                            and lymph nodes, unspecified                           R18.8, Other ascites                           R11.2, Nausea with vomiting, unspecified                           R63.4, Abnormal weight loss CPT copyright 2022  American Medical Association. All rights reserved. The codes documented in this report are preliminary and upon coder review may  be revised to meet current compliance requirements. Corliss Parish, MD 10/16/2022 9:16:24 AM Number of Addenda: 0

## 2022-10-16 NOTE — Progress Notes (Signed)
PROGRESS NOTE    Steven Wolf  ZOX:[id number] DOB: January 04, 1956 DOA: 10/14/2022 PCP: Pincus Sanes, MD   Brief Narrative:  Steven Wolf is a 67 y.o. male with medical history of recent diagnosis of adenocarcinoma, staging is still pending.  Patient went to oncology today due to poor p.o. intake, he barely is able to drink 8 ounce of water and 1-2 shakes a day.  Due to this he was directly admitted to hospital per oncology request for PEG tube placement.  GI and general surgery is consulted.   Assessment & Plan:   Principal Problem:   Partial gastric outlet obstruction Active Problems:   Essential hypertension, benign   BPH (benign prostatic hyperplasia)   Anxiety   Adenocarcinoma of stomach (HCC)   Gastric outlet obstruction  recent diagnosis of gastric adenocarcinoma / gastric outlet obstruction: S/p EUS by Dr. Meridee Score 10/16/2022.  General surgery on board and plans for diet stick laparoscopy, possible acetic fluid sampling and J-tube placement and may be enteral stent.    Hypertension-blood pressure controlled, continue home equivalent dose ARB   GERD-Protonix 40 mg p.o. daily   Depression-continue Prozac  DVT prophylaxis: SCDs Start: 10/14/22 1626   Code Status: Full Code  Family Communication: Wife and a friend present at bedside.  Plan of care discussed with patient in length and he/she verbalized understanding and agreed with it.  Status is: Inpatient Remains inpatient appropriate because: Scheduled for procedures today.     Estimated body mass index is 21.62 kg/m as calculated from the following:   Height as of this encounter: 6' (1.829 m).   Weight as of this encounter: 72.3 kg.    Nutritional Assessment: Body mass index is 21.62 kg/m.Marland Kitchen Seen by dietician.  I agree with the assessment and plan as outlined below: Nutrition Status:        . Skin Assessment: I have examined the patient's skin and I agree with the wound assessment as performed by  the wound care RN as outlined below:    Consultants:  GI and general surgery  Procedures:  None  Antimicrobials:  Anti-infectives (From admission, onward)    Start     Dose/Rate Route Frequency Ordered Stop   10/16/22 0730  cefoTEtan (CEFOTAN) 2 g in sodium chloride 0.9 % 100 mL IVPB        2 g 200 mL/hr over 30 Minutes Intravenous On call to O.R. 10/15/22 1051 10/17/22 0559         Subjective: Seen and examined at bedside after he returned from EUS.  Wife and friend at the bedside.  Patient has no complaints.  Objective: Vitals:   10/16/22 0904 10/16/22 0905 10/16/22 0910 10/16/22 0915  BP:   (!) 147/95   Pulse: 85 83 84 76  Resp: 16 12 20 16   Temp:      TempSrc:      SpO2: 100% 100% 98% 98%  Weight:      Height:        Intake/Output Summary (Last 24 hours) at 10/16/2022 1103 Last data filed at 10/16/2022 0852 Gross per 24 hour  Intake 2465.15 ml  Output --  Net 2465.15 ml   Filed Weights   10/14/22 1502 10/16/22 0723  Weight: 72.3 kg 72.3 kg    Examination:  General exam: Appears calm and comfortable  Respiratory system: Clear to auscultation. Respiratory effort normal. Cardiovascular system: S1 & S2 heard, RRR. No JVD, murmurs, rubs, gallops or clicks. No pedal edema. Gastrointestinal system: Abdomen is  nondistended, soft and nontender. No organomegaly or masses felt. Normal bowel sounds heard. Central nervous system: Alert and oriented. No focal neurological deficits. Extremities: Symmetric 5 x 5 power. Skin: No rashes, lesions or ulcers.  Psychiatry: Judgement and insight appear normal. Mood & affect appropriate.   Data Reviewed: I have personally reviewed following labs and imaging studies  CBC: Recent Labs  Lab 10/09/22 1343 10/14/22 1320 10/15/22 0606  WBC 7.5 6.3 5.0  NEUTROABS 5.5 4.4  --   HGB 15.5 14.7 13.3  HCT 45.8 42.1 39.0  MCV 89.3 87.3 90.3  PLT 263 205 176   Basic Metabolic Panel: Recent Labs  Lab 10/09/22 1343  10/14/22 1320 10/15/22 0606  NA 137 138 137  K 3.9 4.1 3.6  CL 100 102 103  CO2 27 25 23   GLUCOSE 105* 75 81  BUN 37* 20 19  CREATININE 1.16 1.01 0.96  CALCIUM 10.3 9.8 8.7*   GFR: Estimated Creatinine Clearance: 74.3 mL/min (by C-G formula based on SCr of 0.96 mg/dL). Liver Function Tests: Recent Labs  Lab 10/09/22 1343 10/14/22 1320  AST 27 17  ALT 56* 28  ALKPHOS 63 56  BILITOT 0.7 0.6  PROT 7.3 6.5  ALBUMIN 4.5 4.1   No results for input(s): "LIPASE", "AMYLASE" in the last 168 hours. No results for input(s): "AMMONIA" in the last 168 hours. Coagulation Profile: No results for input(s): "INR", "PROTIME" in the last 168 hours. Cardiac Enzymes: No results for input(s): "CKTOTAL", "CKMB", "CKMBINDEX", "TROPONINI" in the last 168 hours. BNP (last 3 results) No results for input(s): "PROBNP" in the last 8760 hours. HbA1C: No results for input(s): "HGBA1C" in the last 72 hours. CBG: No results for input(s): "GLUCAP" in the last 168 hours. Lipid Profile: No results for input(s): "CHOL", "HDL", "LDLCALC", "TRIG", "CHOLHDL", "LDLDIRECT" in the last 72 hours. Thyroid Function Tests: No results for input(s): "TSH", "T4TOTAL", "FREET4", "T3FREE", "THYROIDAB" in the last 72 hours. Anemia Panel: No results for input(s): "VITAMINB12", "FOLATE", "FERRITIN", "TIBC", "IRON", "RETICCTPCT" in the last 72 hours. Sepsis Labs: No results for input(s): "PROCALCITON", "LATICACIDVEN" in the last 168 hours.  No results found for this or any previous visit (from the past 240 hour(s)).   Radiology Studies: No results found.  Scheduled Meds:  FLUoxetine  20 mg Oral Daily   irbesartan  75 mg Oral Daily   pantoprazole  40 mg Oral Daily   polyethylene glycol  17 g Oral Daily   Continuous Infusions:  sodium chloride 100 mL/hr at 10/15/22 1723   cefoTEtan (CEFOTAN) IV       LOS: 1 day   Hughie Closs, MD [facility]  10/16/2022, 11:03 AM   *Please note that this is a verbal  dictation therefore any spelling or grammatical errors are due to the "Dragon [employer] One" system interpretation.  Please page via Amion and do not message via secure chat for urgent patient care matters. Secure chat can be used for non urgent patient care matters.  How to contact the [facility] Attending or Consulting provider 7A - 7P or covering provider during after hours 7P -7A, for this patient?  Check the care team in [facility] and look for a) attending/consulting [facility] provider listed and b) the [facility] team listed. Page or secure chat 7A-7P. Log into [email or web address] and use [place]'s universal password to access. If you do not have the password, please contact the hospital operator. Locate the [facility] provider you are looking for under [facility] and page to a number that  you can be directly reached. If you still have difficulty reaching the provider, please page the [facility] (Director on Call) for the Hospitalists listed on amion for assistance.

## 2022-10-16 NOTE — Progress Notes (Signed)
Initial Nutrition Assessment  INTERVENTION:   -Please consult RD once tube is ready to use  TF recommendations: -Monitor magnesium, potassium, and phosphorus for at least 3 days, MD to replete as needed, as pt is at risk for refeeding syndrome. -Initiate Osmolite 1.5 @ 20 ml/hr via J-tube, advance by 10 ml every 12 hours to goal rate of 65 ml/hr. -Provides at goal: 2340 kcals, 97g protein and 1188 ml H2O.   NUTRITION DIAGNOSIS:   Increased nutrient needs related to cancer and cancer related treatments as evidenced by estimated needs.  GOAL:   Patient will meet greater than or equal to 90% of their needs  MONITOR:   PO intake, Supplement acceptance, Labs, Weight trends, I & O's  REASON FOR ASSESSMENT:   Malnutrition Screening Tool    ASSESSMENT:   67 y.o. male with medical history of recent diagnosis of adenocarcinoma, staging is still pending.  Patient went to oncology today due to poor p.o. intake, he barely is able to drink 8 ounce of water and 1-2 shakes a day.  Due to this he was directly admitted to hospital per oncology request for PEG tube placement.  Patient unavailable at this time, in OR. Pt has been NPO today for upper EUS and now in OR for dx laparoscopy and open J-tube placement.  Pt seen [facility] on 7/19 and reported at that time early satiety. Pt had been trying to consume 3 Fairlife shakes daily but was only able to consume 1 daily. Was eating small amounts of other foods but not able to meet needs.   Per oncology notes, pt has been having early satiety and bloating for 2 months. Has lost 20-25 lbs during this time.   Pt will require continuous feeds following J-tube placement. Will leave tube feeding recommendations above.  Medications: Miralax, Lactated ringers  Labs reviewed.  NUTRITION - FOCUSED PHYSICAL EXAM:  Unable to complete at this time.  Diet Order:   Diet Order             Diet NPO time specified  Diet effective midnight                    EDUCATION NEEDS:   Not appropriate for education at this time  Skin:  Skin Assessment: Reviewed RN Assessment  Last BM:  7/20  Height:   Ht Readings from Last 1 Encounters:  10/16/22 6' (1.829 m)    Weight:   Wt Readings from Last 1 Encounters:  10/16/22 72.3 kg    BMI:  Body mass index is 21.62 kg/m.  Estimated Nutritional Needs:   Kcal:  2150-2350  Protein:  105-120g  Fluid:  2.1L/day  Tilda Franco, MS, RD, LDN Inpatient Clinical Dietitian Contact information available via Amion

## 2022-10-16 NOTE — Transfer of Care (Signed)
Immediate Anesthesia Transfer of Care Note  Patient: Steven Wolf  Procedure(s) Performed: UPPER ENDOSCOPIC ULTRASOUND (EUS) RADIAL ESOPHAGOGASTRODUODENOSCOPY (EGD) WITH PROPOFOL BIOPSY  Patient Location: short stay  Anesthesia Type:MAC  Level of Consciousness: drowsy  Airway & Oxygen Therapy: Patient Spontanous Breathing and Patient connected to face mask oxygen  Post-op Assessment: Report given to RN, Post -op Vital signs reviewed and stable, and Patient moving all extremities X 4  Post vital signs: Reviewed and stable  Last Vitals:  Vitals Value Taken Time  BP    Temp    Pulse    Resp    SpO2      Last Pain:  Vitals:   10/16/22 0723  TempSrc: Temporal  PainSc: 0-No pain         Complications: No notable events documented.

## 2022-10-16 NOTE — Interval H&P Note (Signed)
History and Physical Interval Note:  10/16/2022 1:21 PM  Steven Wolf  has presented today for surgery, with the diagnosis of Gastric Cancer.  The various methods of treatment have been discussed with the patient and family. After consideration of risks, benefits and other options for treatment, the patient has consented to  Procedure(s): DIAGNOSTIC LAPAROSCOPY, OPEN J TUBE PLACEMENT (N/A) INSERTION PORT-A-CATH (N/A) as a surgical intervention.  The patient's history has been reviewed, patient examined, no change in status, stable for surgery.  I have reviewed the patient's chart and labs.  Questions were answered to the patient's satisfaction.     Almond Lint

## 2022-10-16 NOTE — Anesthesia Preprocedure Evaluation (Signed)
Anesthesia Evaluation  Patient identified by MRN, date of birth, ID band Patient awake    Reviewed: Allergy & Precautions, H&P , NPO status , Patient's Chart, lab work & pertinent test results  Airway Mallampati: II  TM Distance: >3 FB Neck ROM: Full    Dental no notable dental hx.    Pulmonary neg pulmonary ROS   Pulmonary exam normal breath sounds clear to auscultation       Cardiovascular hypertension, negative cardio ROS Normal cardiovascular exam Rhythm:Regular Rate:Normal     Neuro/Psych negative neurological ROS  negative psych ROS   GI/Hepatic Neg liver ROS,GERD  ,,  Endo/Other  negative endocrine ROS    Renal/GU negative Renal ROS  negative genitourinary   Musculoskeletal negative musculoskeletal ROS (+)    Abdominal   Peds negative pediatric ROS (+)  Hematology negative hematology ROS (+)   Anesthesia Other Findings   Reproductive/Obstetrics negative OB ROS                             Anesthesia Physical Anesthesia Plan  ASA: 2  Anesthesia Plan: MAC   Post-op Pain Management: Minimal or no pain anticipated   Induction: Intravenous  PONV Risk Score and Plan: 1 and Propofol infusion and Treatment may vary due to age or medical condition  Airway Management Planned: Simple Face Mask  Additional Equipment:   Intra-op Plan:   Post-operative Plan:   Informed Consent: I have reviewed the patients History and Physical, chart, labs and discussed the procedure including the risks, benefits and alternatives for the proposed anesthesia with the patient or authorized representative who has indicated his/her understanding and acceptance.     Dental advisory given  Plan Discussed with: CRNA and Surgeon  Anesthesia Plan Comments:        Anesthesia Quick Evaluation

## 2022-10-17 ENCOUNTER — Encounter: Payer: Self-pay | Admitting: Gastroenterology

## 2022-10-17 ENCOUNTER — Inpatient Hospital Stay (HOSPITAL_COMMUNITY): Payer: Medicare PPO

## 2022-10-17 DIAGNOSIS — E43 Unspecified severe protein-calorie malnutrition: Secondary | ICD-10-CM | POA: Insufficient documentation

## 2022-10-17 DIAGNOSIS — K311 Adult hypertrophic pyloric stenosis: Secondary | ICD-10-CM | POA: Diagnosis not present

## 2022-10-17 LAB — COMPREHENSIVE METABOLIC PANEL
ALT: 25 U/L (ref 0–44)
AST: 20 U/L (ref 15–41)
Anion gap: 14 (ref 5–15)
CO2: 18 mmol/L — ABNORMAL LOW (ref 22–32)
Calcium: 8.7 mg/dL — ABNORMAL LOW (ref 8.9–10.3)
Chloride: 101 mmol/L (ref 98–111)
Creatinine, Ser: 0.89 mg/dL (ref 0.61–1.24)
GFR, Estimated: >60 mL/min (ref >60)
Glucose, Bld: 147 mg/dL — ABNORMAL HIGH (ref 70–99)
Potassium: 4.1 mmol/L (ref 3.5–5.1)
Sodium: 133 mmol/L — ABNORMAL LOW (ref 135–145)
Total Bilirubin: 1.6 mg/dL — ABNORMAL HIGH (ref 0.3–1.2)

## 2022-10-17 LAB — GLUCOSE, CAPILLARY
Glucose-Capillary: 147 mg/dL — ABNORMAL HIGH (ref 70–99)
Glucose-Capillary: 129 mg/dL — ABNORMAL HIGH (ref 70–99)
Glucose-Capillary: 152 mg/dL — ABNORMAL HIGH (ref 70–99)

## 2022-10-17 LAB — SURGICAL PATHOLOGY

## 2022-10-17 MED ORDER — OSMOLITE 1.5 CAL PO LIQD
1000.0000 mL | ORAL | Status: DC
  Administered 2022-10-17 – 2022-10-23 (×5): 1000 mL
  Filled 2022-10-17 (×11): qty 1000

## 2022-10-17 MED ORDER — POLYETHYLENE GLYCOL 3350 17 G PO PACK
17.0000 g | PACK | Freq: Every day | ORAL | Status: DC | PRN
  Filled 2022-10-17: qty 1

## 2022-10-17 MED ORDER — ACETAMINOPHEN 500 MG PO TABS
1000.0000 mg | ORAL_TABLET | Freq: Four times a day (QID) | ORAL | Status: DC
  Administered 2022-10-17 – 2022-10-22 (×14): 1000 mg via ORAL
  Filled 2022-10-17 (×16): qty 2

## 2022-10-17 MED ORDER — METHOCARBAMOL 500 MG PO TABS
500.0000 mg | ORAL_TABLET | Freq: Four times a day (QID) | ORAL | Status: DC
  Administered 2022-10-17 – 2022-10-22 (×17): 500 mg via ORAL
  Filled 2022-10-17 (×17): qty 1

## 2022-10-17 MED ORDER — HYDROMORPHONE HCL 1 MG/ML IJ SOLN
0.5000 mg | INTRAMUSCULAR | Status: DC | PRN
  Administered 2022-10-19 – 2022-10-22 (×3): 0.5 mg via INTRAVENOUS
  Filled 2022-10-17 (×3): qty 0.5

## 2022-10-17 MED ORDER — DOCUSATE SODIUM 100 MG PO CAPS
100.0000 mg | ORAL_CAPSULE | Freq: Two times a day (BID) | ORAL | Status: DC
  Administered 2022-10-17 – 2022-10-22 (×11): 100 mg via ORAL
  Filled 2022-10-17 (×11): qty 1

## 2022-10-17 MED ORDER — ENOXAPARIN SODIUM 40 MG/0.4ML IJ SOSY
40.0000 mg | PREFILLED_SYRINGE | INTRAMUSCULAR | Status: DC
  Administered 2022-10-17 – 2022-11-02 (×16): 40 mg via SUBCUTANEOUS
  Filled 2022-10-17 (×16): qty 0.4

## 2022-10-17 NOTE — Plan of Care (Signed)

## 2022-10-17 NOTE — Progress Notes (Signed)
Patient ID: Steven Wolf, male   DOB: 07-31-1955, 67 y.o.   MRN: [id number] [facility] Surgery Progress Note  1 Day Post-Op  Subjective: CC-  States that he had a bad night. He was having a lot of abdominal pain. Worse with deep inspiration. Tried morphine once but it made him nauseated and he vomited. Feeling a little better this morning. Was able to stand at side of bed without issues. No flatus or BM, but does not feel bloated.  Objective: Vital signs in last 24 hours: Temp:  [97.6 F (36.4 C)-98.2 F (36.8 C)] 98.2 F (36.8 C) (07/25 0355) Pulse Rate:  [74-109] 109 (07/25 0355) Resp:  [12-19] 18 (07/25 0355) BP: (123-152)/(71-107) 152/107 (07/25 0355) SpO2:  [95 %-100 %] 98 % (07/25 0355) Last BM Date : 10/12/22  Intake/Output from previous day: 07/24 0701 - 07/25 0700 In: 1600 [I.V.:1600] Out: 425 [Urine:400; Blood:25] Intake/Output this shift: Total I/O In: -  Out: 325 [Urine:325]  PE: Gen:  Alert, NAD, pleasant Pulm: CTAB, shallow breathing, on [place] Abd: Soft, mild distension, hypoactive bowel sounds, surgical wound with honeycomb dressing with some dried blood, J tube site cdi  Lab Results:  Recent Labs    10/15/22 0606  WBC 5.0  HGB 13.3  HCT 39.0  PLT 176   BMET Recent Labs    10/15/22 0606 10/17/22 0521  NA 137 133*  K 3.6 4.1  CL 103 101  CO2 23 18*  GLUCOSE 81 147*  BUN 19 14  CREATININE 0.96 0.89  CALCIUM 8.7* 8.7*   PT/INR No results for input(s): "LABPROT", "INR" in the last 72 hours. CMP     Component Value Date/Time   NA 133 (L) 10/17/2022 0521   K 4.1 10/17/2022 0521   CL 101 10/17/2022 0521   CO2 18 (L) 10/17/2022 0521   GLUCOSE 147 (H) 10/17/2022 0521   BUN 14 10/17/2022 0521   CREATININE 0.89 10/17/2022 0521   CREATININE 1.01 10/14/2022 1320   CREATININE 1.20 12/13/2019 1219   CALCIUM 8.7 (L) 10/17/2022 0521   PROT 6.3 (L) 10/17/2022 0521   ALBUMIN 3.4 (L) 10/17/2022 0521   AST 20 10/17/2022 0521   AST 17  10/14/2022 1320   ALT 25 10/17/2022 0521   ALT 28 10/14/2022 1320   ALKPHOS 50 10/17/2022 0521   BILITOT 1.6 (H) 10/17/2022 0521   BILITOT 0.6 10/14/2022 1320   GFRNONAA >60 10/17/2022 0521   GFRNONAA >60 10/14/2022 1320   GFRNONAA 63 12/13/2019 1219   GFRAA 73 12/13/2019 1219   Lipase  No results found for: "LIPASE"     Studies/Results: DG CHEST PORT 1 VIEW  Result Date: 10/17/2022 CLINICAL DATA:  [id number] Port-A-Cath in place [id number] EXAM: PORTABLE CHEST 1 VIEW COMPARISON:  10/16/2022. FINDINGS: There is new free air under the domes of diaphragm. Correlate with history. Redemonstration of patient's known pre-existing pneumomediastinum. There are atelectatic changes at the lung bases, left more than right. Apparent blunting of left lateral costophrenic angle is nonspecific and may be due to overlying lung parenchymal opacity versus small pleural effusion. Right lateral costophrenic angle is clear. Stable cardio-mediastinal silhouette. No acute osseous abnormalities. The soft tissues are within normal limits. Left-sided CT Port-A-Cath is again seen with its tip overlying the cavoatrial junction region. IMPRESSION: *New pneumoperitoneum. *Redemonstration of pre-existing pneumomediastinum. No discrete pneumothorax seen. Critical Value/emergent results were called by telephone at the time of interpretation on 10/17/2022 at 8:22 am to provider Dr. Donell Beers , who verbally acknowledged these results.  Electronically Signed   By: Jules Schick M.D.   On: 10/17/2022 08:26   DG CHEST PORT 1 VIEW  Result Date: 10/16/2022 CLINICAL DATA:  Port-A-Cath in place EXAM: PORTABLE CHEST 1 VIEW COMPARISON:  Chest x-ray September 03, 2013 FINDINGS: Curvilinear lucencies along the margins of the mediastinum. No visible pneumothorax on this semi erect radiograph. No consolidation. Cardiomediastinal silhouette is within normal limits. Left subclavian approach Port-A-Cath with the tip projecting at the superior right atrium  IMPRESSION: 1. Interval development of curvilinear lucencies along the margins of the mediastinum, which are are suspicious for pneumomediastinum. Less likely pneumopericardium. No visible pneumothorax on this semi erect radiograph. CT of the chest could further characterize if clinically warranted. 2. Left subclavian approach Port-A-Cath with the tip projecting at the superior right atrium. These results will be called to the ordering clinician or representative by the Radiologist Assistant, and communication documented in the PACS or [employer]. Electronically Signed   By: Feliberto Harts M.D.   On: 10/16/2022 17:54   DG C-Arm 1-60 Min-No Report  Result Date: 10/16/2022 Fluoroscopy was utilized by the requesting physician.  No radiographic interpretation.    Anti-infectives: Anti-infectives (From admission, onward)    Start     Dose/Rate Route Frequency Ordered Stop   10/16/22 0730  cefoTEtan (CEFOTAN) 2 g in sodium chloride 0.9 % 100 mL IVPB        2 g 200 mL/hr over 30 Minutes Intravenous On call to O.R. 10/15/22 1051 10/16/22 1845        Assessment/Plan Gastric cancer, [id number], partial gastric outlet obstruction  -POD#1 s/p left subclavian port placement, Bard ClearVue Power Port, MRI safe, 8-French, diagnostic laparoscopy, 16 Fr feeding jejunostomy tube 7/24 Dr. Donell Beers - surgical path pending - continue clear liquids, no plans to advance - dietician consult for assistance with starting J tube feedings - NO MEDS PER J TUBE - schedule tylenol and robaxin for improved pain control - add IS. Encourage mobilization    ID - none VTE - lovenox FEN - CLD, start J TF Foley - none   HTN GERD Depression    LOS: 2 days    Franne Forts, [facility] Surgery 10/17/2022, 1:28 PM Please see Amion for pager number during day hours 7:00am-4:30pm

## 2022-10-17 NOTE — Anesthesia Postprocedure Evaluation (Signed)
Anesthesia Post Note  Patient: Steven Wolf  Procedure(s) Performed: DIAGNOSTIC LAPAROSCOPY, OPEN J TUBE PLACEMENT INSERTION PORT-A-CATH     Patient location during evaluation: PACU Anesthesia Type: General Level of consciousness: awake and alert Pain management: pain level controlled Vital Signs Assessment: post-procedure vital signs reviewed and stable Respiratory status: spontaneous breathing, nonlabored ventilation, respiratory function stable and patient connected to nasal cannula oxygen Cardiovascular status: blood pressure returned to baseline and stable Postop Assessment: no apparent nausea or vomiting Anesthetic complications: no   No notable events documented.  Last Vitals:  Vitals:   10/17/22 1401 10/17/22 1955  BP: 132/89 (!) 134/97  Pulse: (!) 104 (!) 101  Resp: 15   Temp: 36.7 C 36.6 C  SpO2: 96% 98%    Last Pain:  Vitals:   10/17/22 1955  TempSrc: Oral  PainSc:                  Steven Wolf

## 2022-10-17 NOTE — Discharge Instructions (Signed)
Only use feeding tube for tube feedings, no medications  [facility]      [facility], [place] [phone]  OPEN ABDOMINAL SURGERY: POST OP INSTRUCTIONS  Always review your discharge instruction sheet given to you by the facility where your surgery was performed.  IF YOU HAVE DISABILITY OR FAMILY LEAVE FORMS, YOU MUST BRING THEM TO THE OFFICE FOR PROCESSING.  PLEASE DO NOT GIVE THEM TO YOUR DOCTOR.  A prescription for pain medication may be given to you upon discharge.  Take your pain medication as prescribed, if needed.  If narcotic pain medicine is not needed, then you may take acetaminophen (Tylenol) or ibuprofen (Advil) as needed. Take your usually prescribed medications unless otherwise directed. If you need a refill on your pain medication, please contact your pharmacy. They will contact our office to request authorization.  Prescriptions will not be filled after 5pm or on week-ends. You should follow a light diet the first few days after arrival home, such as soup and crackers, pudding, etc.unless your doctor has advised otherwise. A high-fiber, low fat diet can be resumed as tolerated.   Be sure to include lots of fluids daily. Most patients will experience some swelling and bruising on the chest and neck area.  Ice packs will help.  Swelling and bruising can take several days to resolve Most patients will experience some swelling and bruising in the area of the incision. Ice pack will help. Swelling and bruising can take several days to resolve..  It is common to experience some constipation if taking pain medication after surgery.  Increasing fluid intake and taking a stool softener will usually help or prevent this problem from occurring.  A mild laxative (Milk of Magnesia or Miralax) should be taken according to package directions if there are no bowel movements after 48 hours.  You may have steri-strips (small skin tapes) in place directly over the incision.  These strips should be  left on the skin for 7-10 days.  If your surgeon used skin glue on the incision, you may shower in 24 hours.  The glue will flake off over the next 2-3 weeks.  Any sutures or staples will be removed at the office during your follow-up visit. You may find that a light gauze bandage over your incision may keep your staples from being rubbed or pulled. You may shower and replace the bandage daily. ACTIVITIES:  You may resume regular (light) daily activities beginning the next day--such as daily self-care, walking, climbing stairs--gradually increasing activities as tolerated.  You may have sexual intercourse when it is comfortable.  Refrain from any heavy lifting or straining until approved by your doctor. You may drive when you no longer are taking prescription pain medication, you can comfortably wear a seatbelt, and you can safely maneuver your car and apply brakes Return to Work: ___________________________________ Steven Wolf should see your doctor in the office for a follow-up appointment approximately two weeks after your surgery.  Make sure that you call for this appointment within a day or two after you arrive home to insure a convenient appointment time. OTHER INSTRUCTIONS:  _____________________________________________________________ _____________________________________________________________  WHEN TO CALL YOUR DOCTOR: Fever over 101.0 Inability to urinate Nausea and/or vomiting Extreme swelling or bruising Continued bleeding from incision. Increased pain, redness, or drainage from the incision. Difficulty swallowing or breathing Muscle cramping or spasms. Numbness or tingling in hands or feet or around lips.  The clinic staff is available to answer your questions during regular business hours.  Please don't  hesitate to call and ask to speak to one of the nurses if you have concerns.  For further questions, please visit [email or web address]

## 2022-10-17 NOTE — Progress Notes (Signed)
Nutrition Follow-up  DOCUMENTATION CODES:   Severe malnutrition in context of chronic illness  INTERVENTION:   -Monitor magnesium, potassium, and phosphorus for at least 3 days, MD to replete as needed, as pt is at risk for refeeding syndrome. -Initiate Osmolite 1.5 @ 20 ml/hr via J-tube, advance by 10 ml every 12 hours to goal rate of 65 ml/hr. -Provides at goal: 2340 kcals, 97g protein and 1188 ml H2O.  NUTRITION DIAGNOSIS:   Severe Malnutrition related to chronic illness, cancer and cancer related treatments as evidenced by percent weight loss, energy intake < or equal to 75% for > or equal to 1 month, mild fat depletion.  GOAL:   Patient will meet greater than or equal to 90% of their needs  MONITOR:   PO intake, Supplement acceptance, Labs, Weight trends, I & O's  REASON FOR ASSESSMENT:   Consult Enteral/tube feeding initiation and management  ASSESSMENT:   67 y.o. male with medical history of recent diagnosis of adenocarcinoma, staging is still pending.  Patient went to oncology today due to poor p.o. intake, he barely is able to drink 8 ounce of water and 1-2 shakes a day.  Due to this he was directly admitted to hospital per oncology request for PEG tube placement.  7/24: s/p upper EUS, s/p dx laparoscopy and open J-tube placement   Patient in room with wife and visitor at bedside. Pt and family report pt was not eating much for 2-3 weeks PTA. Pt was unable to take in much volume and food and liquid would start to come back up. Pt will be at risk of refeeding given prolonged poor intakes and nutrition. Currently on clear liquids, prefers to just sip on water as he says he feels terrible if takes in too much PO. Will d/c Boost Breeze now that tube feeding is being started.  Pt will require continuous feeds given J-tube placement.  Placed tube feeding orders to start at noon. Relayed plan to RN.   Per weight records, pt has lost 29 lbs since 4/11 (15% wt loss x 3.5  months, significant for time frame).   Medications: Colace  Labs reviewed: Low Na   NUTRITION - FOCUSED PHYSICAL EXAM:  Flowsheet Row Most Recent Value  Orbital Region Mild depletion  Upper Arm Region Unable to assess  Thoracic and Lumbar Region Unable to assess  Buccal Region Moderate depletion  Temple Region Mild depletion  Clavicle Bone Region Unable to assess  Clavicle and Acromion Bone Region Unable to assess  Scapular Bone Region Unable to assess  Dorsal Hand Unable to assess  Patellar Region Unable to assess  Anterior Thigh Region Unable to assess  Posterior Calf Region Unable to assess  Edema (RD Assessment) None  Hair Reviewed  Eyes Reviewed  Mouth Reviewed  Skin Reviewed  [pale]       Diet Order:   Diet Order             Diet clear liquid Fluid consistency: Thin  Diet effective now                   EDUCATION NEEDS:   Education needs have been addressed  Skin:  Skin Assessment: Skin Integrity Issues: Skin Integrity Issues:: Incisions Incisions: 7/24  Last BM:  7/20  Height:   Ht Readings from Last 1 Encounters:  10/16/22 6' (1.829 m)    Weight:   Wt Readings from Last 1 Encounters:  10/16/22 72.3 kg    BMI:  Body mass index  is 21.62 kg/m.  Estimated Nutritional Needs:   Kcal:  2150-2350  Protein:  105-120g  Fluid:  2.1L/day  Tilda Franco, MS, RD, LDN Inpatient Clinical Dietitian Contact information available via Amion

## 2022-10-17 NOTE — Hospital Course (Addendum)
Steven Wolf is a 67 y.o. male with a history of gastric adenocarcinoma, hypertension, depression.  Patient presented secondary to need for J-tube placement for poor oral intake secondary to gastric outlet obstruction.  Post G-tube placement, patient with inability to advance tube feeds.  General surgery consulted for concern of ileus versus bowel obstruction. Patient continued to fail advancement of tube feed rate. Repeat imaging was concerning for development of pneumatosis. Tube feeds held and TPN started on 8/1.  Antibiotics started.

## 2022-10-17 NOTE — Progress Notes (Signed)
  Progress Note   Patient: Steven Wolf ZOX:[id number] DOB: 02-18-1956 DOA: 10/14/2022     2 DOS: the patient was seen and examined on 10/17/2022   Brief hospital course: 67 y.o. male with medical history of recent diagnosis of adenocarcinoma, staging is still pending.  Patient went to oncology today due to poor p.o. intake, he barely is able to drink 8 ounce of water and 1-2 shakes a day.  Due to this he was directly admitted to hospital per oncology request for PEG tube placement.  GI and general surgery is consulted.   Assessment and Plan: recent diagnosis of gastric adenocarcinoma / gastric outlet obstruction:  -Pt is s/p EUS by Dr. Meridee Score 10/16/2022.  General surgery following and pt is now s/p laparoscopy and J-tube placement 7/24 -cont with tube feed per dietitian -Oncology following. Awaiting final pathology   Hypertension -blood pressure controlled, continue home equivalent dose ARB   GERD-Protonix 40 mg p.o. daily   Depression-continue Prozac      Subjective: without complaints today  Physical Exam: Vitals:   10/16/22 1745 10/16/22 1900 10/17/22 0355 10/17/22 1401  BP: (!) 135/90 (!) 123/91 (!) 152/107 132/89  Pulse: 84 88 (!) 109 (!) 104  Resp: 12 14 18 15   Temp: 97.7 F (36.5 C) 97.8 F (36.6 C) 98.2 F (36.8 C) 98 F (36.7 C)  TempSrc:  Oral Oral Oral  SpO2: 97% 96% 98% 96%  Weight:      Height:       General exam: Awake, laying in bed, in nad Respiratory system: Normal respiratory effort, no wheezing Cardiovascular system: regular rate, s1, s2 Gastrointestinal system: Soft, nondistended, positive BS Central nervous system: CN2-12 grossly intact, strength intact Extremities: Perfused, no clubbing Skin: Normal skin turgor, no notable skin lesions seen Psychiatry: Mood normal // no visual hallucinations   Data Reviewed:  Labs reviewed: Na 133, K 4.1, Cr 0.89  Family Communication: Pt in room, family at bedside  Disposition: Status is:  Inpatient Remains inpatient appropriate because: Severity of illness  Planned Discharge Destination: Home    Author: Rickey Barbara, MD 10/17/2022 7:46 PM  For on call review [email or web address].

## 2022-10-18 ENCOUNTER — Encounter (HOSPITAL_COMMUNITY): Payer: Self-pay | Admitting: Gastroenterology

## 2022-10-18 ENCOUNTER — Ambulatory Visit (HOSPITAL_COMMUNITY): Payer: Medicare PPO

## 2022-10-18 ENCOUNTER — Encounter: Payer: Self-pay | Admitting: Hematology

## 2022-10-18 DIAGNOSIS — K311 Adult hypertrophic pyloric stenosis: Secondary | ICD-10-CM | POA: Diagnosis not present

## 2022-10-18 LAB — GLUCOSE, CAPILLARY
Glucose-Capillary: 126 mg/dL — ABNORMAL HIGH (ref 70–99)
Glucose-Capillary: 142 mg/dL — ABNORMAL HIGH (ref 70–99)
Glucose-Capillary: 146 mg/dL — ABNORMAL HIGH (ref 70–99)
Glucose-Capillary: 147 mg/dL — ABNORMAL HIGH (ref 70–99)
Glucose-Capillary: 160 mg/dL — ABNORMAL HIGH (ref 70–99)
Glucose-Capillary: 172 mg/dL — ABNORMAL HIGH (ref 70–99)
Glucose-Capillary: 177 mg/dL — ABNORMAL HIGH (ref 70–99)

## 2022-10-18 LAB — COMPREHENSIVE METABOLIC PANEL: Sodium: 134 mmol/L — ABNORMAL LOW (ref 135–145)

## 2022-10-18 NOTE — Progress Notes (Signed)
Nutrition Follow-up  DOCUMENTATION CODES:   Severe malnutrition in context of chronic illness  INTERVENTION:   -Monitor magnesium, potassium, and phosphorus for at least 3 days, MD to replete as needed, as pt is at risk for refeeding syndrome. -Continue Osmolite 1.5 @ 30 ml/hr via J-tube, advance by 10 ml every 12 hours to goal rate of 65 ml/hr. -Provides at goal: 2340 kcals, 97g protein and 1188 ml H2O.  16 hour feed recommendations:  -Osmolite 1.5 @ 95 ml/hr x 16 hours, provides 2280 kcals, 95g protein and 1158 ml H2O.  NUTRITION DIAGNOSIS:   Severe Malnutrition related to chronic illness, cancer and cancer related treatments as evidenced by percent weight loss, energy intake < or equal to 75% for > or equal to 1 month, mild fat depletion.  Ongoing  GOAL:   Patient will meet greater than or equal to 90% of their needs  Progressing with tube feeds  MONITOR:   PO intake, Supplement acceptance, Labs, Weight trends, I & O's  ASSESSMENT:   67 y.o. male with medical history of recent diagnosis of adenocarcinoma, staging is still pending.  Patient went to oncology today due to poor p.o. intake, he barely is able to drink 8 ounce of water and 1-2 shakes a day.  Due to this he was directly admitted to hospital per oncology request for PEG tube placement.  7/24: s/p upper EUS, s/p dx laparoscopy and open J-tube placement   Patient tolerating Osmolite 1.5 @ 30 ml/hr today. Still advancing slowly to goal rate. Goal for discharge will be to transition to 16 hour feeds, recommendations above.  Admission weight: 159 lbs No new weight for admission.  Medications: Colace   Labs reviewed: CBGs: 129-177 Low Na Low Phos   Diet Order:   Diet Order             Diet clear liquid Fluid consistency: Thin  Diet effective now                   EDUCATION NEEDS:   Education needs have been addressed  Skin:  Skin Assessment: Skin Integrity Issues: Skin Integrity Issues::  Incisions Incisions: 7/24  Last BM:  7/20  Height:   Ht Readings from Last 1 Encounters:  10/16/22 6' (1.829 m)    Weight:   Wt Readings from Last 1 Encounters:  10/16/22 72.3 kg    BMI:  Body mass index is 21.62 kg/m.  Estimated Nutritional Needs:   Kcal:  2150-2350  Protein:  105-120g  Fluid:  2.1L/day   Tilda Franco, MS, RD, LDN Inpatient Clinical Dietitian Contact information available via Amion

## 2022-10-18 NOTE — Plan of Care (Signed)

## 2022-10-18 NOTE — Progress Notes (Signed)
This RN spoke w/ pt & his wife regarding discharge next week. Pt will need 3 days of labs prior to discharge per dietician. Pt & wife state they would prefer to go home on a pump vs bolus feeds. This RN explained if pt goes home with a feeding pump, [facility] will provide a IV pole & back pack for transport outside of home. Pt can take tube feeds to chemo trx if he remains on a continuous feed. Pt's wife has concerns about pt walking up the stairs to 2nd floor. There is no bedroom on the 1st floor. Pt & wife to discuss with TOC this weekend

## 2022-10-18 NOTE — TOC Progression Note (Addendum)
Transition of Care [facility]) - Progression Note    Patient Details  Name: Steven Wolf MRN: [id number] Date of Birth: 1956-02-26  [facility] CM/SW Contact  Beckie Busing, RN Phone [phone]  10/18/2022, 1:57 PM  Clinical Narrative:    TOC continues to follow dfor patient with tube feeds and home health needs. Pam with [facility] is following for tube feeds and [facility] is following for Home health.   Expected Discharge Plan: Home w Home Health Services Barriers to Discharge: No Barriers Identified  Expected Discharge Plan and Services     Post Acute Care Choice: Home Health Living arrangements for the past 2 months: Single Family Home                           HH Arranged: RN HH Agency: [facility] Date [facility] Agency Contacted: 10/16/22 Time HH Agency Contacted: 1047 Representative spoke with at [facility] Agency: Kandee Keen   Social Determinants of Health (SDOH) Interventions SDOH Screenings   Food Insecurity: No Food Insecurity (10/14/2022)  Housing: Low Risk  (10/14/2022)  Transportation Needs: No Transportation Needs (10/14/2022)  Utilities: Not At Risk (10/14/2022)  Depression (PHQ2-9): Medium Risk (08/13/2022)  Financial Resource Strain: Low Risk  (08/12/2022)  Physical Activity: Sufficiently Active (08/12/2022)  Social Connections: Unknown (08/12/2022)  Stress: Stress Concern Present (08/12/2022)  Tobacco Use: Low Risk  (10/16/2022)    Readmission Risk Interventions     No data to display

## 2022-10-18 NOTE — Progress Notes (Signed)
  Progress Note   Patient: Steven Wolf:[id number] DOB: Feb 24, 1956 DOA: 10/14/2022     3 DOS: the patient was seen and examined on 10/18/2022   Brief hospital course: 67 y.o. male with medical history of recent diagnosis of adenocarcinoma, staging is still pending.  Patient went to oncology today due to poor p.o. intake, he barely is able to drink 8 ounce of water and 1-2 shakes a day.  Due to this he was directly admitted to hospital per oncology request for PEG tube placement.  GI and general surgery is consulted.   Assessment and Plan: recent diagnosis of gastric adenocarcinoma / gastric outlet obstruction:  -Pt is s/p EUS by Dr. Meridee Score 10/16/2022.  General surgery following and pt is now s/p laparoscopy and J-tube placement 7/24 -cont with tube feed per dietitian -Oncology following. -Pathology reviewed with pt. Gastric samples as well as peritoneal tissue positive for adenocarcinoma.   Hypertension -blood pressure controlled, continue home equivalent dose ARB   GERD-Protonix 40 mg p.o. daily   Depression-continue Prozac      Subjective: Tolerating tube feeding thus far. Without complaints  Physical Exam: Vitals:   10/17/22 1401 10/17/22 1955 10/18/22 0404 10/18/22 1317  BP: 132/89 (!) 134/97 (!) 139/99 (!) 138/94  Pulse: (!) 104 (!) 101 95 (!) 107  Resp: 15  18 20   Temp: 98 F (36.7 C) 97.8 F (36.6 C) 97.9 F (36.6 C) (!) 97.3 F (36.3 C)  TempSrc: Oral Oral Oral Oral  SpO2: 96% 98% 98% 92%  Weight:      Height:       General exam: Conversant, in no acute distress Respiratory system: normal chest rise, clear, no audible wheezing Cardiovascular system: regular rhythm, s1-s2 Gastrointestinal system: Nondistended, nontender, pos BS Central nervous system: No seizures, no tremors Extremities: No cyanosis, no joint deformities Skin: No rashes, no pallor Psychiatry: Affect normal // no auditory hallucinations   Data Reviewed:  Labs reviewed: Na 134, K 3.5,  Cr 0.73, WBC 10.5, Hgb 12.6  Family Communication: Pt in room, family at bedside  Disposition: Status is: Inpatient Remains inpatient appropriate because: Severity of illness  Planned Discharge Destination: Home    Author: Rickey Barbara, MD 10/18/2022 4:34 PM  For on call review [email or web address].

## 2022-10-18 NOTE — Care Management Important Message (Signed)
Important Message  Patient Details IM Letter given. Name: Steven Wolf MRN: [id number] Date of Birth: [birth year]   Medicare Important Message Given:  Yes     Caren Macadam 10/18/2022, 9:13 AM

## 2022-10-18 NOTE — Progress Notes (Signed)
Patient ID: Steven Wolf, male   DOB: January 09, 1956, 67 y.o.   MRN: [id number] [facility] Surgery Progress Note  2 Days Post-Op  Subjective: CC-  Pt feeling a lot better this AM.  Didn't request any narcotic meds in around 18-24 hours.  Muscle relaxant seemed to help the most.  Tolerating tube feeds, now at 30 ml/hr.  SOB resolved.  No flatus yet.    Objective: Vital signs in last 24 hours: Temp:  [97.8 F (36.6 C)-98 F (36.7 C)] 97.9 F (36.6 C) (07/26 0404) Pulse Rate:  [95-104] 95 (07/26 0404) Resp:  [15-18] 18 (07/26 0404) BP: (132-139)/(89-99) 139/99 (07/26 0404) SpO2:  [96 %-98 %] 98 % (07/26 0404) Last BM Date : 10/12/22  Intake/Output from previous day: 07/25 0701 - 07/26 0700 In: 2132.3 [I.V.:1778.3; NG/GT:324] Out: 325 [Urine:325] Intake/Output this shift: No intake/output data recorded.  PE: Gen:  Alert, NAD, pleasant Pulm: CTAB, shallow breathing, on [place] Abd: Soft, non distended, surgical wound with honeycomb dressing with some dried blood, J tube site cdi  Lab Results:  No results for input(s): "WBC", "HGB", "HCT", "PLT" in the last 72 hours.  BMET Recent Labs    10/17/22 0521  NA 133*  K 4.1  CL 101  CO2 18*  GLUCOSE 147*  BUN 14  CREATININE 0.89  CALCIUM 8.7*   PT/INR No results for input(s): "LABPROT", "INR" in the last 72 hours. CMP     Component Value Date/Time   NA 133 (L) 10/17/2022 0521   K 4.1 10/17/2022 0521   CL 101 10/17/2022 0521   CO2 18 (L) 10/17/2022 0521   GLUCOSE 147 (H) 10/17/2022 0521   BUN 14 10/17/2022 0521   CREATININE 0.89 10/17/2022 0521   CREATININE 1.01 10/14/2022 1320   CREATININE 1.20 12/13/2019 1219   CALCIUM 8.7 (L) 10/17/2022 0521   PROT 6.3 (L) 10/17/2022 0521   ALBUMIN 3.4 (L) 10/17/2022 0521   AST 20 10/17/2022 0521   AST 17 10/14/2022 1320   ALT 25 10/17/2022 0521   ALT 28 10/14/2022 1320   ALKPHOS 50 10/17/2022 0521   BILITOT 1.6 (H) 10/17/2022 0521   BILITOT 0.6 10/14/2022 1320   GFRNONAA  >60 10/17/2022 0521   GFRNONAA >60 10/14/2022 1320   GFRNONAA 63 12/13/2019 1219   GFRAA 73 12/13/2019 1219   Lipase  No results found for: "LIPASE"     Studies/Results: DG CHEST PORT 1 VIEW  Result Date: 10/17/2022 CLINICAL DATA:  [id number] Port-A-Cath in place [id number] EXAM: PORTABLE CHEST 1 VIEW COMPARISON:  10/16/2022. FINDINGS: There is new free air under the domes of diaphragm. Correlate with history. Redemonstration of patient's known pre-existing pneumomediastinum. There are atelectatic changes at the lung bases, left more than right. Apparent blunting of left lateral costophrenic angle is nonspecific and may be due to overlying lung parenchymal opacity versus small pleural effusion. Right lateral costophrenic angle is clear. Stable cardio-mediastinal silhouette. No acute osseous abnormalities. The soft tissues are within normal limits. Left-sided CT Port-A-Cath is again seen with its tip overlying the cavoatrial junction region. IMPRESSION: *New pneumoperitoneum. *Redemonstration of pre-existing pneumomediastinum. No discrete pneumothorax seen. Critical Value/emergent results were called by telephone at the time of interpretation on 10/17/2022 at 8:22 am to provider Dr. Donell Beers , who verbally acknowledged these results. Electronically Signed   By: Jules Schick M.D.   On: 10/17/2022 08:26   DG CHEST PORT 1 VIEW  Result Date: 10/16/2022 CLINICAL DATA:  Port-A-Cath in place EXAM: PORTABLE CHEST 1 VIEW COMPARISON:  Chest x-ray September 03, 2013 FINDINGS: Curvilinear lucencies along the margins of the mediastinum. No visible pneumothorax on this semi erect radiograph. No consolidation. Cardiomediastinal silhouette is within normal limits. Left subclavian approach Port-A-Cath with the tip projecting at the superior right atrium IMPRESSION: 1. Interval development of curvilinear lucencies along the margins of the mediastinum, which are are suspicious for pneumomediastinum. Less likely pneumopericardium.  No visible pneumothorax on this semi erect radiograph. CT of the chest could further characterize if clinically warranted. 2. Left subclavian approach Port-A-Cath with the tip projecting at the superior right atrium. These results will be called to the ordering clinician or representative by the Radiologist Assistant, and communication documented in the PACS or [employer]. Electronically Signed   By: Feliberto Harts M.D.   On: 10/16/2022 17:54   DG C-Arm 1-60 Min-No Report  Result Date: 10/16/2022 Fluoroscopy was utilized by the requesting physician.  No radiographic interpretation.    Anti-infectives: Anti-infectives (From admission, onward)    Start     Dose/Rate Route Frequency Ordered Stop   10/16/22 0730  cefoTEtan (CEFOTAN) 2 g in sodium chloride 0.9 % 100 mL IVPB        2 g 200 mL/hr over 30 Minutes Intravenous On call to O.R. 10/15/22 1051 10/16/22 1845        Assessment/Plan Gastric cancer, uT3N1cMx, partial gastric outlet obstruction  -POD#2 s/p left subclavian port placement, [employer] ClearVue Power Port, MRI safe, 8-French, diagnostic laparoscopy with peritoneal/diaphragmatic biopsies. 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  - surgical path pending - continue clear liquids, no plans to advance. Gastric outlet 9 mm.   - dietician consult for assistance with J tube feedings; patient has orders to advance. Goal 65 mL/hr.  Then can move to cycling feeds.  - NO MEDS PER J TUBE - scheduled tylenol and robaxin for improved pain control - IS. Encourage mobilization  Wean O2   ID - none VTE - lovenox FEN - CLD, J TF Foley - none   HTN GERD Depression  Call for further questions. We are available.    LOS: 3 days    Maudry Diego, MD, FACS, FSSO Surgical Oncology, General Surgery, Trauma and Critical [facility] Surgery, [place] [phone] for weekday/non holidays Check [email or web address]  for coverage night/weekend/holidays

## 2022-10-18 NOTE — Progress Notes (Signed)
Steven Wolf   DOB:29-Mar-1955   ZO#:[id number]   JWJ#:[id number]  Medical oncology follow-up note.  Subjective: Patient is tolerating the tube feeding well overall.,  No diarrhea, rate is at 40 mL/h when I saw him. Nausea is controlled.   Objective:  Vitals:   10/18/22 1317 10/18/22 2038  BP: (!) 138/94 (!) 135/97  Pulse: (!) 107 (!) 109  Resp: 20 16  Temp: (!) 97.3 F (36.3 C) 97.8 F (36.6 C)  SpO2: 92% 92%    Body mass index is 21.62 kg/m.  Intake/Output Summary (Last 24 hours) at 10/18/2022 2212 Last data filed at 10/18/2022 0800 Gross per 24 hour  Intake 948.18 ml  Output --  Net 948.18 ml     Sclerae unicteric  MSK no focal spinal tenderness, no peripheral edema  Neuro nonfocal    CBG (last 3)  Recent Labs    10/18/22 1124 10/18/22 1649 10/18/22 2005  GLUCAP 142* 147* 126*     Labs:   Urine Studies No results for input(s): "UHGB", "CRYS" in the last 72 hours.  Invalid input(s): "UACOL", "UAPR", "USPG", "UPH", "UTP", "UGL", "UKET", "UBIL", "UNIT", "UROB", "ULEU", "UEPI", "UWBC", "URBC", "UBAC", "CAST", "UCOM", "BILUA"  Basic Metabolic Panel: Recent Labs  Lab 10/14/22 1320 10/15/22 0606 10/17/22 0521 10/18/22 0758  NA 138 137 133* 134*  K 4.1 3.6 4.1 3.5  CL 102 103 101 102  CO2 25 23 18* 25  GLUCOSE 75 81 147* 160*  BUN 20 19 14 14   CREATININE 1.01 0.96 0.89 0.73  CALCIUM 9.8 8.7* 8.7* 8.3*  MG  --   --   --  1.9  PHOS  --   --   --  1.4*   GFR Estimated Creatinine Clearance: 89.1 mL/min (by C-G formula based on SCr of 0.73 mg/dL). Liver Function Tests: Recent Labs  Lab 10/14/22 1320 10/17/22 0521 10/18/22 0758  AST 17 20 18   ALT 28 25 21   ALKPHOS 56 50 48  BILITOT 0.6 1.6* 0.6  PROT 6.5 6.3* 5.6*  ALBUMIN 4.1 3.4* 2.8*   No results for input(s): "LIPASE", "AMYLASE" in the last 168 hours. No results for input(s): "AMMONIA" in the last 168 hours. Coagulation profile No results for input(s): "INR", "PROTIME" in the last 168  hours.  CBC: Recent Labs  Lab 10/14/22 1320 10/15/22 0606 10/18/22 0758  WBC 6.3 5.0 10.5  NEUTROABS 4.4  --   --   HGB 14.7 13.3 12.6*  HCT 42.1 39.0 36.6*  MCV 87.3 90.3 88.4  PLT 205 176 152   Cardiac Enzymes: No results for input(s): "CKTOTAL", "CKMB", "CKMBINDEX", "TROPONINI" in the last 168 hours. BNP: Invalid input(s): "POCBNP" CBG: Recent Labs  Lab 10/18/22 0406 10/18/22 0732 10/18/22 1124 10/18/22 1649 10/18/22 2005  GLUCAP 160* 146* 142* 147* 126*   D-Dimer No results for input(s): "DDIMER" in the last 72 hours. Hgb A1c No results for input(s): "HGBA1C" in the last 72 hours. Lipid Profile No results for input(s): "CHOL", "HDL", "LDLCALC", "TRIG", "CHOLHDL", "LDLDIRECT" in the last 72 hours. Thyroid function studies No results for input(s): "TSH", "T4TOTAL", "T3FREE", "THYROIDAB" in the last 72 hours.  Invalid input(s): "FREET3" Anemia work up No results for input(s): "VITAMINB12", "FOLATE", "FERRITIN", "TIBC", "IRON", "RETICCTPCT" in the last 72 hours. Microbiology No results found for this or any previous visit (from the past 240 hour(s)).    Studies:  DG CHEST PORT 1 VIEW  Result Date: 10/17/2022 CLINICAL DATA:  [id number] Port-A-Cath in place [id number] EXAM: PORTABLE CHEST 1  VIEW COMPARISON:  10/16/2022. FINDINGS: There is new free air under the domes of diaphragm. Correlate with history. Redemonstration of patient's known pre-existing pneumomediastinum. There are atelectatic changes at the lung bases, left more than right. Apparent blunting of left lateral costophrenic angle is nonspecific and may be due to overlying lung parenchymal opacity versus small pleural effusion. Right lateral costophrenic angle is clear. Stable cardio-mediastinal silhouette. No acute osseous abnormalities. The soft tissues are within normal limits. Left-sided CT Port-A-Cath is again seen with its tip overlying the cavoatrial junction region. IMPRESSION: *New pneumoperitoneum.  *Redemonstration of pre-existing pneumomediastinum. No discrete pneumothorax seen. Critical Value/emergent results were called by telephone at the time of interpretation on 10/17/2022 at 8:22 am to provider Steven Wolf , who verbally acknowledged these results. Electronically Signed   By: Jules Schick M.D.   On: 10/17/2022 08:26    Assessment: 67 y.o. male   Gastric cancer with probable peritoneal metastasis Failure to thrive, status post J-tube placement    Plan:  -I discussed his renal biopsy results.  Dr. Meridee Score did additional gastric biopsy which showed diffuse involvement of his poorly differentiated adenocarcinoma.  Peritoneal nodule biopsy also confirmed metastatic adenocarcinoma, ascites cytology was suspicious for malignant cells. -Unfortunately he has stage IV disease with peritoneal metastasis, which is not curable.  -I discussed his MMR result with pt and his sister and brother-in-law, who is a Sports administrator. 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  I discussed the benefit, high response rate, and potential side effect with patient and his family.  He would likely respond to immunotherapy better than chemotherapy.  Patient is a very gladder that he has a immunotherapy option. -Discharge per primary team, no additional oncological workup is planned during his hospital stay. -I will arrange chemo class and follow-up with me next week, plan to start treatment the week of August 5  [name], MD 10/18/2022

## 2022-10-19 ENCOUNTER — Inpatient Hospital Stay (HOSPITAL_COMMUNITY): Payer: Medicare PPO

## 2022-10-19 DIAGNOSIS — K311 Adult hypertrophic pyloric stenosis: Secondary | ICD-10-CM | POA: Diagnosis not present

## 2022-10-19 DIAGNOSIS — C169 Malignant neoplasm of stomach, unspecified: Secondary | ICD-10-CM | POA: Diagnosis not present

## 2022-10-19 LAB — GLUCOSE, CAPILLARY
Glucose-Capillary: 209 mg/dL — ABNORMAL HIGH (ref 70–99)
Glucose-Capillary: 135 mg/dL — ABNORMAL HIGH (ref 70–99)
Glucose-Capillary: 162 mg/dL — ABNORMAL HIGH (ref 70–99)
Glucose-Capillary: 158 mg/dL — ABNORMAL HIGH (ref 70–99)
Glucose-Capillary: 126 mg/dL — ABNORMAL HIGH (ref 70–99)

## 2022-10-19 MED ORDER — POTASSIUM CHLORIDE CRYS ER 20 MEQ PO TBCR
60.0000 meq | EXTENDED_RELEASE_TABLET | Freq: Once | ORAL | Status: AC
  Administered 2022-10-19: 60 meq via ORAL
  Filled 2022-10-19: qty 3

## 2022-10-19 MED ORDER — PROCHLORPERAZINE EDISYLATE 10 MG/2ML IJ SOLN
5.0000 mg | Freq: Once | INTRAMUSCULAR | Status: AC
  Administered 2022-10-19: 5 mg via INTRAVENOUS
  Filled 2022-10-19: qty 2

## 2022-10-19 MED ORDER — PROCHLORPERAZINE EDISYLATE 10 MG/2ML IJ SOLN
5.0000 mg | Freq: Four times a day (QID) | INTRAMUSCULAR | Status: DC | PRN
  Administered 2022-10-20 – 2022-10-25 (×6): 5 mg via INTRAVENOUS
  Filled 2022-10-19 (×9): qty 2

## 2022-10-19 NOTE — Progress Notes (Signed)
Patient reports vomiting about 10 ounces. Per patient vomit was brownish in color. PRN Zofran given.

## 2022-10-19 NOTE — Progress Notes (Signed)
Residual checked on PEG tube per Dr. Rhona Leavens. Residual was approximately 60 ml, directed by provider to make on surgery aware. Paged Dr. Carolynne Edouard, who was on call for surgery. He stated to defer to hospitalist as they have not seen the patient.

## 2022-10-19 NOTE — Plan of Care (Addendum)
Nausea and vomiting overnight, TF rate was held at 40cc/hr and not increased at 0300 per provider on call. PRN zofran  x2  Once dose Compazine for n/v given. PRN IV dilaudid x1 for pain 7/10 overnight.  Problem: Education: Goal: Knowledge of General Education information will improve Description: Including pain rating scale, medication(s)/side effects and non-pharmacologic comfort measures Outcome: Progressing   Problem: Health Behavior/Discharge Planning: Goal: Ability to manage health-related needs will improve Outcome: Progressing   Problem: Clinical Measurements: Goal: Ability to maintain clinical measurements within normal limits will improve Outcome: Progressing Goal: Will remain free from infection Outcome: Progressing Goal: Diagnostic test results will improve Outcome: Progressing Goal: Respiratory complications will improve Outcome: Progressing Goal: Cardiovascular complication will be avoided Outcome: Progressing   Problem: Activity: Goal: Risk for activity intolerance will decrease Outcome: Progressing   Problem: Nutrition: Goal: Adequate nutrition will be maintained Outcome: Progressing   Problem: Coping: Goal: Level of anxiety will decrease Outcome: Progressing   Problem: Elimination: Goal: Will not experience complications related to bowel motility Outcome: Progressing Goal: Will not experience complications related to urinary retention Outcome: Progressing   Problem: Pain Managment: Goal: General experience of comfort will improve Outcome: Progressing   Problem: Safety: Goal: Ability to remain free from injury will improve Outcome: Progressing   Problem: Skin Integrity: Goal: Risk for impaired skin integrity will decrease Outcome: Progressing

## 2022-10-19 NOTE — Progress Notes (Signed)
Patient vomited again. Per Dr. Rhona Leavens hold tube feeds for remainder of the day.  X-ray ordered. Patient reports feeling like his saliva is not going down & that it may be contributing to the vomiting.

## 2022-10-19 NOTE — Progress Notes (Signed)
  Progress Note   Patient: Steven Wolf:[id number] DOB: October 03, 1955 DOA: 10/14/2022     4 DOS: the patient was seen and examined on 10/19/2022   Brief hospital course: 67 y.o. male with medical history of recent diagnosis of adenocarcinoma, staging is still pending.  Patient went to oncology today due to poor p.o. intake, he barely is able to drink 8 ounce of water and 1-2 shakes a day.  Due to this he was directly admitted to hospital per oncology request for PEG tube placement.  GI and general surgery is consulted.   Assessment and Plan: recent diagnosis of gastric adenocarcinoma / gastric outlet obstruction:  -Pt is s/p EUS by Dr. Meridee Score 10/16/2022.  General surgery following and pt is now s/p laparoscopy and J-tube placement 7/24 -Oncology had been following. -Pathology reviewed with pt. Gastric samples as well as peritoneal tissue positive for adenocarcinoma. -Overnight, pt noted to not tolerate tube feed with tube feed reduced to 40cc. Later in afternoon, again had episodes of n/v -Tube feeds now on hold. Pt noted to have around 50cc tube feed residual. Ordering abd xray   Hypertension -blood pressure controlled, continue home equivalent dose ARB   GERD-Protonix 40 mg p.o. daily   Depression-continue Prozac      Subjective: Vomited overnight and again later in afternoon  Physical Exam: Vitals:   10/19/22 0632 10/19/22 0705 10/19/22 0948 10/19/22 1344  BP:   (!) 150/104 (!) 169/110  Pulse: (!) 119  (!) 113 (!) 117  Resp:   18 18  Temp:   98.3 F (36.8 C) 98.5 F (36.9 C)  TempSrc:   Oral   SpO2:   94% 95%  Weight:  76.7 kg    Height:       General exam: Awake, laying in bed, in nad Respiratory system: Normal respiratory effort, no wheezing Cardiovascular system: regular rate, s1, s2 Gastrointestinal system: Soft, nondistended, positive BS Central nervous system: CN2-12 grossly intact, strength intact Extremities: Perfused, no clubbing Skin: Normal skin  turgor, no notable skin lesions seen Psychiatry: Mood normal // no visual hallucinations   Data Reviewed:  Labs reviewed: Na 138, K 3.4, Cr 0.81, WBC 8.4, Hgb 14.5  Family Communication: Pt in room, family at bedside  Disposition: Status is: Inpatient Remains inpatient appropriate because: Severity of illness  Planned Discharge Destination: Home    Author: Rickey Barbara, MD 10/19/2022 5:16 PM  For on call review [email or web address].

## 2022-10-20 ENCOUNTER — Other Ambulatory Visit: Payer: Self-pay

## 2022-10-20 DIAGNOSIS — K311 Adult hypertrophic pyloric stenosis: Secondary | ICD-10-CM | POA: Diagnosis not present

## 2022-10-20 DIAGNOSIS — C169 Malignant neoplasm of stomach, unspecified: Secondary | ICD-10-CM | POA: Diagnosis not present

## 2022-10-20 LAB — COMPREHENSIVE METABOLIC PANEL WITH GFR
ALT: 80 U/L — ABNORMAL HIGH (ref 0–44)
AST: 36 U/L (ref 15–41)
Albumin: 2.8 g/dL — ABNORMAL LOW (ref 3.5–5.0)
Alkaline Phosphatase: 65 U/L (ref 38–126)
Anion gap: 10 (ref 5–15)
BUN: 23 mg/dL (ref 8–23)
CO2: 27 mmol/L (ref 22–32)
Calcium: 8.7 mg/dL — ABNORMAL LOW (ref 8.9–10.3)
Chloride: 102 mmol/L (ref 98–111)
Creatinine, Ser: 0.79 mg/dL (ref 0.61–1.24)
GFR, Estimated: >60 mL/min (ref >60)
Glucose, Bld: 142 mg/dL — ABNORMAL HIGH (ref 70–99)
Potassium: 3.8 mmol/L (ref 3.5–5.1)
Sodium: 139 mmol/L (ref 135–145)
Total Bilirubin: 1.4 mg/dL — ABNORMAL HIGH (ref 0.3–1.2)
Total Protein: 6.1 g/dL — ABNORMAL LOW (ref 6.5–8.1)

## 2022-10-20 LAB — GLUCOSE, CAPILLARY
Glucose-Capillary: 129 mg/dL — ABNORMAL HIGH (ref 70–99)
Glucose-Capillary: 131 mg/dL — ABNORMAL HIGH (ref 70–99)
Glucose-Capillary: 130 mg/dL — ABNORMAL HIGH (ref 70–99)
Glucose-Capillary: 124 mg/dL — ABNORMAL HIGH (ref 70–99)

## 2022-10-20 NOTE — Progress Notes (Signed)
4 Days Post-Op   Subjective/Chief Complaint: Complains of some nausea. Having flatus   Objective: Vital signs in last 24 hours: Temp:  [97.7 F (36.5 C)-98.5 F (36.9 C)] 97.7 F (36.5 C) (07/28 0614) Pulse Rate:  [110-117] 114 (07/28 0614) Resp:  [14-18] 14 (07/28 0614) BP: (132-169)/(101-110) 132/101 (07/28 0614) SpO2:  [93 %-95 %] 93 % (07/28 0614) Weight:  [74.1 kg] 74.1 kg (07/28 0700) Last BM Date : (S) 10/15/22 (per pt report)  Intake/Output from previous day: 07/27 0701 - 07/28 0700 In: -  Out: 110 [Emesis/NG output:110] Intake/Output this shift: No intake/output data recorded.  General appearance: alert and cooperative Resp: clear to auscultation bilaterally Cardio: regular rate and rhythm GI: soft, mild to moderate distension  Lab Results:  Recent Labs    10/18/22 0758 10/19/22 0837  WBC 10.5 8.4  HGB 12.6* 14.5  HCT 36.6* 43.6  PLT 152 182   BMET Recent Labs    10/18/22 0758 10/19/22 0837  NA 134* 138  K 3.5 3.4*  CL 102 102  CO2 25 27  GLUCOSE 160* 178*  BUN 14 17  CREATININE 0.73 0.81  CALCIUM 8.3* 8.6*   PT/INR No results for input(s): "LABPROT", "INR" in the last 72 hours. ABG No results for input(s): "PHART", "HCO3" in the last 72 hours.  Invalid input(s): "PCO2", "PO2"  Studies/Results: DG Abd 1 View  Result Date: 10/19/2022 CLINICAL DATA:  Bowel obstruction. EXAM: ABDOMEN - 1 VIEW COMPARISON:  CT 10/11/2022 FINDINGS: Catheter overlying the left hemiabdomen. Multiple dilated air-filled loops of bowel again identified. Colonic loops measuring up to 7.8 cm and small bowel loops of 5.1 cm. Ileus versus developing obstruction is possible. No obvious free air seen beneath the diaphragm is clipped off the edge of the film. Again please correlate with patient's CT scan describing gastric pathology. IMPRESSION: Catheter in the left midabdomen. Dilated loops of small and large bowel diffusely. Ileus versus obstruction. Recommend close follow-up  or additional workup as clinically appropriate Electronically Signed   By: Karen Kays M.D.   On: 10/19/2022 18:29    Anti-infectives: Anti-infectives (From admission, onward)    Start     Dose/Rate Route Frequency Ordered Stop   10/16/22 0730  cefoTEtan (CEFOTAN) 2 g in sodium chloride 0.9 % 100 mL IVPB        2 g 200 mL/hr over 30 Minutes Intravenous On call to O.R. 10/15/22 1051 10/16/22 1845       Assessment/Plan: s/p Procedure(s): DIAGNOSTIC LAPAROSCOPY, OPEN J TUBE PLACEMENT (N/A) INSERTION PORT-A-CATH (N/A) Metastatic gastric cancer with outlet obstruction We may not be able to do much about the nausea if his stomach is obstructed short of an ng tube Abd xrays suggest ileus. Will hold on j tube feeds until distension improves Will follow  LOS: 5 days    Chevis Pretty III 10/20/2022

## 2022-10-20 NOTE — Progress Notes (Signed)
  Progress Note   Patient: Steven Wolf ZOX:[id number] DOB: [birth year] DOA: 10/14/2022     5 DOS: the patient was seen and examined on 10/20/2022   Brief hospital course: 67 y.o. male with medical history of recent diagnosis of adenocarcinoma, staging is still pending.  Patient went to oncology today due to poor p.o. intake, he barely is able to drink 8 ounce of water and 1-2 shakes a day.  Due to this he was directly admitted to hospital per oncology request for PEG tube placement.  GI and general surgery is consulted.   Assessment and Plan: recent diagnosis of gastric adenocarcinoma / gastric outlet obstruction:  -Pt is s/p EUS by Dr. Meridee Score 10/16/2022.  General surgery following and pt is now s/p laparoscopy and J-tube placement 7/24 -Oncology had been following. -Pathology reviewed with pt. Gastric samples as well as peritoneal tissue positive for adenocarcinoma. -Recently, pt stopped tolerating tube feeding secondary to n/v -Ordered and reviewed abd xray - findings notable for ileus vs obstruction -Tube feeds now on hold -General Surgery cont to follow. Recs to hold on J tube feeds until distension improves   Hypertension -blood pressure controlled, continue home equivalent dose ARB   GERD-Protonix 40 mg p.o. daily   Depression-continue Prozac      Subjective: nauseated overnight  Physical Exam: Vitals:   10/19/22 2201 10/20/22 0614 10/20/22 0700 10/20/22 1258  BP: (!) 151/104 (!) 132/101  (!) 131/103  Pulse: (!) 110 (!) 114    Resp: 15 14  18   Temp: 97.8 F (36.6 C) 97.7 F (36.5 C)  97.8 F (36.6 C)  TempSrc: Oral Oral  Oral  SpO2: 94% 93%  94%  Weight:   74.1 kg   Height:       General exam: Conversant, in no acute distress Respiratory system: normal chest rise, clear, no audible wheezing Cardiovascular system: regular rhythm, s1-s2 Gastrointestinal system: Nondistended, nontender, pos BS Central nervous system: No seizures, no tremors Extremities: No  cyanosis, no joint deformities Skin: No rashes, no pallor Psychiatry: Affect normal // no auditory hallucinations   Data Reviewed:  Labs reviewed: Na 139, K 3.8, Cr 0.79  Family Communication: Pt in room, family at bedside  Disposition: Status is: Inpatient Remains inpatient appropriate because: Severity of illness  Planned Discharge Destination: Home    Author: Rickey Barbara, MD 10/20/2022 5:46 PM  For on call review [email or web address].

## 2022-10-20 NOTE — Progress Notes (Signed)
Patient tolerated soap suds enema. No stool produced just mainly water from the enema.

## 2022-10-21 DIAGNOSIS — K311 Adult hypertrophic pyloric stenosis: Secondary | ICD-10-CM | POA: Diagnosis not present

## 2022-10-21 DIAGNOSIS — C169 Malignant neoplasm of stomach, unspecified: Secondary | ICD-10-CM | POA: Diagnosis not present

## 2022-10-21 LAB — GLUCOSE, CAPILLARY
Glucose-Capillary: 140 mg/dL — ABNORMAL HIGH (ref 70–99)
Glucose-Capillary: 125 mg/dL — ABNORMAL HIGH (ref 70–99)
Glucose-Capillary: 116 mg/dL — ABNORMAL HIGH (ref 70–99)
Glucose-Capillary: 135 mg/dL — ABNORMAL HIGH (ref 70–99)

## 2022-10-21 MED ORDER — METOCLOPRAMIDE HCL 5 MG/ML IJ SOLN
10.0000 mg | Freq: Three times a day (TID) | INTRAMUSCULAR | Status: DC
  Filled 2022-10-21 (×3): qty 2

## 2022-10-21 NOTE — Plan of Care (Signed)

## 2022-10-21 NOTE — Progress Notes (Signed)
Nutrition Follow-up  DOCUMENTATION CODES:   Severe malnutrition in context of chronic illness  INTERVENTION:   -Monitor magnesium, potassium, and phosphorus for at least 3 days, MD to replete as needed, as pt is at risk for refeeding syndrome. -Restart Osmolite 1.5 @ 20 ml/hr via J-tube, advance by 10 ml every 24 hours to goal rate of 65 ml/hr. -Provides at goal: 2340 kcals, 97g protein and 1188 ml H2O.   16 hour feed recommendations:  -Osmolite 1.5 @ 95 ml/hr x 16 hours, provides 2280 kcals, 95g protein and 1158 ml H2O.   NUTRITION DIAGNOSIS:   Severe Malnutrition related to chronic illness, cancer and cancer related treatments as evidenced by percent weight loss, energy intake < or equal to 75% for > or equal to 1 month, mild fat depletion.  Ongoing.  GOAL:   Patient will meet greater than or equal to 90% of their needs  Progressing.  MONITOR:   PO intake, Supplement acceptance, Labs, Weight trends, I & O's  REASON FOR ASSESSMENT:   Consult Enteral/tube feeding initiation and management  ASSESSMENT:   67 y.o. male with medical history of recent diagnosis of adenocarcinoma, staging is still pending.  Patient went to oncology today due to poor p.o. intake, he barely is able to drink 8 ounce of water and 1-2 shakes a day.  Due to this he was directly admitted to hospital per oncology request for PEG tube placement.  7/24: s/p upper EUS, s/p dx laparoscopy and open J-tube placement   Per surgery and oncology notes, tube feeds are okay to restart today at 20 ml/hr. Was turned off over the weekend d/t N/V. Will advance a bit slower given having N/V which pt felt is not d/t tube feeding. Pt spitting up a lot.  Admission weight: 159 lbs Current weight: 162 lbs  Medications: Colace, Reglan, Zofran  Labs reviewed: CBGs: 124-140   Diet Order:   Diet Order             Diet clear liquid Fluid consistency: Thin  Diet effective now                   EDUCATION  NEEDS:   Education needs have been addressed  Skin:  Skin Assessment: Skin Integrity Issues: Skin Integrity Issues:: Incisions Incisions: 7/24  Last BM:  7/23  Height:   Ht Readings from Last 1 Encounters:  10/16/22 6' (1.829 m)    Weight:   Wt Readings from Last 1 Encounters:  10/21/22 73.6 kg    BMI:  Body mass index is 22.01 kg/m.  Estimated Nutritional Needs:   Kcal:  2150-2350  Protein:  105-120g  Fluid:  2.1L/day   Tilda Franco, MS, RD, LDN Inpatient Clinical Dietitian Contact information available via Am[employer]

## 2022-10-21 NOTE — Progress Notes (Signed)
5 Days Post-Op   Subjective/Chief Complaint: Notes nausea/distention with attempts at intake of liquids/ice chips. Passing flatus. Distention otherwise improved   Objective: Vital signs in last 24 hours: Temp:  [97.8 F (36.6 C)-98.2 F (36.8 C)] 97.8 F (36.6 C) (07/29 0507) Pulse Rate:  [99-110] 99 (07/29 0507) Resp:  [14-18] 14 (07/29 0507) BP: (131-154)/(102-103) 152/102 (07/29 0507) SpO2:  [94 %-95 %] 94 % (07/29 0507) Weight:  [73.6 kg] 73.6 kg (07/29 0500) Last BM Date : 10/15/22  Intake/Output from previous day: 07/28 0701 - 07/29 0700 In: 2248.3 [I.V.:2248.3] Out: 800 [Urine:800] Intake/Output this shift: No intake/output data recorded.  General appearance: alert and cooperative Resp: clear to auscultation bilaterally Cardio: regular rate and rhythm GI: soft, nontender; not significantly distended either  Lab Results:  Recent Labs    10/19/22 0837  WBC 8.4  HGB 14.5  HCT 43.6  PLT 182   BMET Recent Labs    10/19/22 0837 10/20/22 0933  NA 138 139  K 3.4* 3.8  CL 102 102  CO2 27 27  GLUCOSE 178* 142*  BUN 17 23  CREATININE 0.81 0.79  CALCIUM 8.6* 8.7*   PT/INR No results for input(s): "LABPROT", "INR" in the last 72 hours. ABG No results for input(s): "PHART", "HCO3" in the last 72 hours.  Invalid input(s): "PCO2", "PO2"  Studies/Results: DG Abd 1 View  Result Date: 10/19/2022 CLINICAL DATA:  Bowel obstruction. EXAM: ABDOMEN - 1 VIEW COMPARISON:  CT 10/11/2022 FINDINGS: Catheter overlying the left hemiabdomen. Multiple dilated air-filled loops of bowel again identified. Colonic loops measuring up to 7.8 cm and small bowel loops of 5.1 cm. Ileus versus developing obstruction is possible. No obvious free air seen beneath the diaphragm is clipped off the edge of the film. Again please correlate with patient's CT scan describing gastric pathology. IMPRESSION: Catheter in the left midabdomen. Dilated loops of small and large bowel diffusely. Ileus  versus obstruction. Recommend close follow-up or additional workup as clinically appropriate Electronically Signed   By: Karen Kays M.D.   On: 10/19/2022 18:29    Anti-infectives: Anti-infectives (From admission, onward)    Start     Dose/Rate Route Frequency Ordered Stop   10/16/22 0730  cefoTEtan (CEFOTAN) 2 g in sodium chloride 0.9 % 100 mL IVPB        2 g 200 mL/hr over 30 Minutes Intravenous On call to O.R. 10/15/22 1051 10/16/22 1845       Assessment/Plan: s/p Procedure(s): DIAGNOSTIC LAPAROSCOPY, OPEN J TUBE PLACEMENT (N/A) INSERTION PORT-A-CATH (N/A) Metastatic gastric cancer with outlet obstruction We may not be able to do much about the nausea if his stomach is obstructed short of an ng tube  Ok to start trickle tube feeds today - 20cc/hr to start today. If tolerates well, can begin to advance toward goal  Will follow - sister was present via speaker phone for portions of our encouter today. He (patient) and his sister have expressed understanding and agreement with plan   LOS: 6 days    Andria Meuse 10/21/2022

## 2022-10-21 NOTE — Progress Notes (Signed)
Pt refused tube feeds tonight due to ongoing abdominal discomfort throughout the day. Will try to restart in the morning.

## 2022-10-21 NOTE — Progress Notes (Addendum)
Steven Wolf   DOB:[birth year]   ZO#:[id number]   JWJ#:[id number]  Medical oncology follow-up note.  Subjective: Patient had a recurrent nausea and vomiting over the weekend, tube feeds was held.  He thinks his nausea may not be related to the tube feeding, he feels he has a lot of saliva coming up that he has to spit.  No other new complaints.   Objective:  Vitals:   10/20/22 2213 10/21/22 0507  BP: (!) 154/103 (!) 152/102  Pulse: (!) 110 99  Resp: 16 14  Temp: 98.2 F (36.8 C) 97.8 F (36.6 C)  SpO2: 95% 94%    Body mass index is 22.01 kg/m.  Intake/Output Summary (Last 24 hours) at 10/21/2022 1053 Last data filed at 10/21/2022 0200 Gross per 24 hour  Intake 2248.32 ml  Output 500 ml  Net 1748.32 ml     Sclerae unicteric  MSK no focal spinal tenderness, no peripheral edema  Neuro nonfocal    CBG (last 3)  Recent Labs    10/20/22 1620 10/20/22 2214 10/21/22 0805  GLUCAP 130* 124* 140*     Labs:   Urine Studies No results for input(s): "UHGB", "CRYS" in the last 72 hours.  Invalid input(s): "UACOL", "UAPR", "USPG", "UPH", "UTP", "UGL", "UKET", "UBIL", "UNIT", "UROB", "ULEU", "UEPI", "UWBC", "URBC", "UBAC", "CAST", "UCOM", "BILUA"  Basic Metabolic Panel: Recent Labs  Lab 10/17/22 0521 10/18/22 0758 10/19/22 0837 10/20/22 0933 10/21/22 0952  NA 133* 134* 138 139 140  K 4.1 3.5 3.4* 3.8 3.9  CL 101 102 102 102 103  CO2 18* 25 27 27 28   GLUCOSE 147* 160* 178* 142* 134*  BUN 14 14 17 23  31*  CREATININE 0.89 0.73 0.81 0.79 0.75  CALCIUM 8.7* 8.3* 8.6* 8.7* 8.9  MG  --  1.9 1.9  --   --   PHOS  --  1.4*  --   --   --    GFR Estimated Creatinine Clearance: 90.7 mL/min (by C-G formula based on SCr of 0.75 mg/dL). Liver Function Tests: Recent Labs  Lab 10/17/22 0521 10/18/22 0758 10/19/22 0837 10/20/22 0933 10/21/22 0952  AST 20 18 85* 36 23  ALT 25 21 101* 80* 59*  ALKPHOS 50 48 74 65 67  BILITOT 1.6* 0.6 1.1 1.4* 1.0  PROT 6.3* 5.6* 6.1* 6.1*  5.8*  ALBUMIN 3.4* 2.8* 3.1* 2.8* 2.6*   No results for input(s): "LIPASE", "AMYLASE" in the last 168 hours. No results for input(s): "AMMONIA" in the last 168 hours. Coagulation profile No results for input(s): "INR", "PROTIME" in the last 168 hours.  CBC: Recent Labs  Lab 10/14/22 1320 10/15/22 0606 10/18/22 0758 10/19/22 0837 10/21/22 0952  WBC 6.3 5.0 10.5 8.4 6.7  NEUTROABS 4.4  --   --   --   --   HGB 14.7 13.3 12.6* 14.5 12.7*  HCT 42.1 39.0 36.6* 43.6 38.2*  MCV 87.3 90.3 88.4 90.3 90.5  PLT 205 176 152 182 170   Cardiac Enzymes: No results for input(s): "CKTOTAL", "CKMB", "CKMBINDEX", "TROPONINI" in the last 168 hours. BNP: Invalid input(s): "POCBNP" CBG: Recent Labs  Lab 10/20/22 0756 10/20/22 1109 10/20/22 1620 10/20/22 2214 10/21/22 0805  GLUCAP 129* 131* 130* 124* 140*   D-Dimer No results for input(s): "DDIMER" in the last 72 hours. Hgb A1c No results for input(s): "HGBA1C" in the last 72 hours. Lipid Profile No results for input(s): "CHOL", "HDL", "LDLCALC", "TRIG", "CHOLHDL", "LDLDIRECT" in the last 72 hours. Thyroid function studies No results for  input(s): "TSH", "T4TOTAL", "T3FREE", "THYROIDAB" in the last 72 hours.  Invalid input(s): "FREET3" Anemia work up No results for input(s): "VITAMINB12", "FOLATE", "FERRITIN", "TIBC", "IRON", "RETICCTPCT" in the last 72 hours. Microbiology No results found for this or any previous visit (from the past 240 hour(s)).    Studies:  DG Abd 1 View  Result Date: 10/19/2022 CLINICAL DATA:  Bowel obstruction. EXAM: ABDOMEN - 1 VIEW COMPARISON:  CT 10/11/2022 FINDINGS: Catheter overlying the left hemiabdomen. Multiple dilated air-filled loops of bowel again identified. Colonic loops measuring up to 7.8 cm and small bowel loops of 5.1 cm. Ileus versus developing obstruction is possible. No obvious free air seen beneath the diaphragm is clipped off the edge of the film. Again please correlate with patient's CT  scan describing gastric pathology. IMPRESSION: Catheter in the left midabdomen. Dilated loops of small and large bowel diffusely. Ileus versus obstruction. Recommend close follow-up or additional workup as clinically appropriate Electronically Signed   By: Karen Kays M.D.   On: 10/19/2022 18:29    Assessment: 67 y.o. 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  We can consider Reglan 3 times a day to suppress nausea -continue NPO, please restart tube feeds. Dr. Cliffton Asters has seen him this morning and wants him to start at 12ml/hr and gradually increase the rate -Plan to start immunotherapy nivolumab and ipilimumab after his hospital discharge -We also discussed that he may have Lynch syndrome, I will set up genetic test for him after discharge.  He is interested. -I spoke with his sister on the phone also. -I discussed his cancer treatment is palliative, to prolong his life and improve his quality of life.  He voiced good understanding.  We discussed CODE STATUS today, he is full code, but he is very open to DNR if he does not do well with his cancer  -Per patient's request, I will consult palliative care to further discuss goals of care and symptom management. -will f/u when he is in hospital.   [name], MD 10/21/2022

## 2022-10-21 NOTE — Progress Notes (Signed)
  Progress Note   Patient: Steven Wolf OZD:[id number] DOB: 08-22-1955 DOA: 10/14/2022     6 DOS: the patient was seen and examined on 10/21/2022   Brief hospital course: 67 y.o. male with medical history of recent diagnosis of adenocarcinoma, staging is still pending.  Patient went to oncology today due to poor p.o. intake, he barely is able to drink 8 ounce of water and 1-2 shakes a day.  Due to this he was directly admitted to hospital per oncology request for PEG tube placement.  GI and general surgery is consulted.   Assessment and Plan: recent diagnosis of gastric adenocarcinoma / gastric outlet obstruction:  -Pt is s/p EUS by Dr. Meridee Score 10/16/2022.  General surgery following and pt is now s/p laparoscopy and J-tube placement 7/24 -Pathology reviewed with pt. Gastric samples as well as peritoneal tissue positive for adenocarcinoma. -Recently, pt stopped tolerating tube feeding secondary to n/v -abd xray notable for ileus vs obstruction -Pt passing flatus and abd is softer. Resume tube feeds at 20cc and gradually increase rate q24hr until at goal   Hypertension -blood pressure stable, continue home equivalent dose ARB   GERD-Protonix 40 mg p.o. daily   Depression-continue Prozac      Subjective: Continues with bouts of nausea   Physical Exam: Vitals:   10/20/22 2213 10/21/22 0500 10/21/22 0507 10/21/22 1344  BP: (!) 154/103  (!) 152/102 (!) 158/103  Pulse: (!) 110  99 95  Resp: 16  14 20   Temp: 98.2 F (36.8 C)  97.8 F (36.6 C) 98.2 F (36.8 C)  TempSrc: Oral  Oral Oral  SpO2: 95%  94% 95%  Weight:  73.6 kg    Height:       General exam: Awake, laying in bed, in nad Respiratory system: Normal respiratory effort, no wheezing Cardiovascular system: regular rate, s1, s2 Gastrointestinal system: Soft, nondistended, positive BS Central nervous system: CN2-12 grossly intact, strength intact Extremities: Perfused, no clubbing Skin: Normal skin turgor, no notable  skin lesions seen Psychiatry: Mood normal // no visual hallucinations   Data Reviewed:  Labs reviewed: Na 140, K 3.9, Cr 0.75, WBC 6.7, Hgb 12.7, Plts 170  Family Communication: Pt in room, family at bedside  Disposition: Status is: Inpatient Remains inpatient appropriate because: Severity of illness  Planned Discharge Destination: Home    Author: Rickey Barbara, MD 10/21/2022 6:13 PM  For on call review [email or web address].

## 2022-10-22 ENCOUNTER — Other Ambulatory Visit: Payer: Self-pay

## 2022-10-22 ENCOUNTER — Encounter: Payer: Self-pay | Admitting: Hematology

## 2022-10-22 DIAGNOSIS — Z7189 Other specified counseling: Secondary | ICD-10-CM

## 2022-10-22 DIAGNOSIS — R112 Nausea with vomiting, unspecified: Secondary | ICD-10-CM

## 2022-10-22 DIAGNOSIS — C163 Malignant neoplasm of pyloric antrum: Secondary | ICD-10-CM | POA: Diagnosis not present

## 2022-10-22 DIAGNOSIS — K311 Adult hypertrophic pyloric stenosis: Secondary | ICD-10-CM | POA: Diagnosis not present

## 2022-10-22 DIAGNOSIS — E43 Unspecified severe protein-calorie malnutrition: Secondary | ICD-10-CM

## 2022-10-22 DIAGNOSIS — Z79899 Other long term (current) drug therapy: Secondary | ICD-10-CM

## 2022-10-22 DIAGNOSIS — K632 Fistula of intestine: Secondary | ICD-10-CM | POA: Diagnosis not present

## 2022-10-22 DIAGNOSIS — C169 Malignant neoplasm of stomach, unspecified: Secondary | ICD-10-CM | POA: Diagnosis not present

## 2022-10-22 DIAGNOSIS — Z515 Encounter for palliative care: Secondary | ICD-10-CM

## 2022-10-22 DIAGNOSIS — R4589 Other symptoms and signs involving emotional state: Secondary | ICD-10-CM

## 2022-10-22 DIAGNOSIS — K5900 Constipation, unspecified: Secondary | ICD-10-CM

## 2022-10-22 DIAGNOSIS — G893 Neoplasm related pain (acute) (chronic): Secondary | ICD-10-CM

## 2022-10-22 LAB — GLUCOSE, CAPILLARY
Glucose-Capillary: 121 mg/dL — ABNORMAL HIGH (ref 70–99)
Glucose-Capillary: 131 mg/dL — ABNORMAL HIGH (ref 70–99)
Glucose-Capillary: 133 mg/dL — ABNORMAL HIGH (ref 70–99)
Glucose-Capillary: 137 mg/dL — ABNORMAL HIGH (ref 70–99)
Glucose-Capillary: 140 mg/dL — ABNORMAL HIGH (ref 70–99)
Glucose-Capillary: 146 mg/dL — ABNORMAL HIGH (ref 70–99)
Glucose-Capillary: 159 mg/dL — ABNORMAL HIGH (ref 70–99)

## 2022-10-22 MED ORDER — HYDROMORPHONE HCL 1 MG/ML IJ SOLN: 0.5000 mg | INTRAMUSCULAR | Status: DC | PRN

## 2022-10-22 MED ORDER — POTASSIUM CHLORIDE CRYS ER 20 MEQ PO TBCR
60.0000 meq | EXTENDED_RELEASE_TABLET | Freq: Once | ORAL | Status: AC
  Administered 2022-10-22: 60 meq via ORAL
  Filled 2022-10-22: qty 3

## 2022-10-22 MED ORDER — SORBITOL 70 % SOLN
960.0000 mL | TOPICAL_OIL | Freq: Once | ORAL | Status: AC
  Administered 2022-10-22: 960 mL via RECTAL
  Filled 2022-10-22: qty 240

## 2022-10-22 MED ORDER — SENNA 8.6 MG PO TABS
2.0000 | ORAL_TABLET | Freq: Two times a day (BID) | ORAL | Status: DC
  Administered 2022-10-22 – 2022-10-24 (×2): 17.2 mg via ORAL
  Filled 2022-10-22 (×4): qty 2

## 2022-10-22 MED ORDER — BISACODYL 10 MG RE SUPP
10.0000 mg | Freq: Once | RECTAL | Status: AC
  Administered 2022-10-22: 10 mg via RECTAL
  Filled 2022-10-22: qty 1

## 2022-10-22 MED ORDER — METHOCARBAMOL 500 MG PO TABS
500.0000 mg | ORAL_TABLET | Freq: Two times a day (BID) | ORAL | Status: DC
  Administered 2022-10-22 – 2022-10-23 (×2): 500 mg via ORAL
  Filled 2022-10-22 (×2): qty 1

## 2022-10-22 MED ORDER — OXYCODONE HCL 5 MG PO TABS
10.0000 mg | ORAL_TABLET | Freq: Every day | ORAL | Status: DC
  Administered 2022-10-22 – 2022-10-23 (×2): 10 mg via ORAL
  Filled 2022-10-22 (×2): qty 2

## 2022-10-22 MED ORDER — ACETAMINOPHEN 500 MG PO TABS
1000.0000 mg | ORAL_TABLET | Freq: Three times a day (TID) | ORAL | Status: DC
  Administered 2022-10-22 – 2022-10-24 (×6): 1000 mg via ORAL
  Filled 2022-10-22 (×6): qty 2

## 2022-10-22 NOTE — Progress Notes (Signed)
  Progress Note   Patient: Steven Wolf ZOX:[id number] DOB: 11/23/1955 DOA: 10/14/2022     7 DOS: the patient was seen and examined on 10/22/2022   Brief hospital course: 67 y.o. male with medical history of recent diagnosis of adenocarcinoma, staging is still pending.  Patient went to oncology today due to poor p.o. intake, he barely is able to drink 8 ounce of water and 1-2 shakes a day.  Due to this he was directly admitted to hospital per oncology request for PEG tube placement.  GI and general surgery is consulted.   Assessment and Plan: recent diagnosis of gastric adenocarcinoma / gastric outlet obstruction:  -Pt is s/p EUS by Dr. Meridee Score 10/16/2022.  General surgery following and pt is now s/p laparoscopy and J-tube placement 7/24 -Pathology reviewed with pt. Gastric samples as well as peritoneal tissue positive for adenocarcinoma. -Recently, pt stopped tolerating tube feeding secondary to n/v -abd xray notable for ileus vs obstruction -Per Gen Surgery, recs to treat for possible constipation standpoint -Trial of suppository today per Surgery. Will f/u with enema if still no results -Appreciate Palliative Care assistance for symptom management.    Hypertension -blood pressure stable, continue home equivalent dose ARB   GERD-Protonix 40 mg p.o. daily   Depression-continue Prozac  Hypokalemia - will replace      Subjective: Did not tolerate tube feeding overnight. Tube feeds again on hold  Physical Exam: Vitals:   10/21/22 1344 10/21/22 2023 10/22/22 0519 10/22/22 1254  BP: (!) 158/103 (!) 163/105 (!) 152/105 128/85  Pulse: 95 87 (!) 102 96  Resp: 20 14 16 18   Temp: 98.2 F (36.8 C) 97.7 F (36.5 C) 97.7 F (36.5 C) 98 F (36.7 C)  TempSrc: Oral Oral Oral Oral  SpO2: 95% 92% 90% 95%  Weight:      Height:       General exam: Conversant, in no acute distress Respiratory system: normal chest rise, clear, no audible wheezing Cardiovascular system: regular rhythm,  s1-s2 Gastrointestinal system: J tube in place, pos BS Central nervous system: No seizures, no tremors Extremities: No cyanosis, no joint deformities Skin: No rashes, no pallor Psychiatry: Affect normal // no auditory hallucinations   Data Reviewed:  Labs reviewed: Na 138, K 3.4, Cr 0.72, WBC 5.1  Family Communication: Pt in room, family at bedside  Disposition: Status is: Inpatient Remains inpatient appropriate because: Severity of illness  Planned Discharge Destination: Home    Author: Rickey Barbara, MD 10/22/2022 4:08 PM  For on call review [email or web address].

## 2022-10-22 NOTE — Care Management Important Message (Signed)
Important Message  Patient Details IM Letter given. Name: Steven Wolf MRN: [id number] Date of Birth: 06-20-1955   Medicare Important Message Given:  Yes     Caren Macadam 10/22/2022, 8:58 AM

## 2022-10-22 NOTE — Consult Note (Signed)
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  Since admission, patient has received continued workup through oncology, GI, and surgery.  Patient underwent J-tube placement for management of tube feeds in setting of gastric obstruction.  Plan is for patient to start palliative immunotherapy after hospitalization.  Palliative medicine team consulted to assist with complex medical decision making and symptom management.  Extensive review of EMR prior to presenting to bedside.  Patient on multiple medications for pain management including: Tylenol 1000 mg every 6 hours scheduled, methocarbamol 500 mg every 6 hours scheduled, oxycodone 5-10 mg every 4 hours as needed with 10 mg dose received once in past 24 hours, IV Dilaudid 0.5 mg every 4 hours as needed received once in past 24 hours.  Patient also on multiple medications for nausea/vomiting including: Reglan 10 mg every 8 hours scheduled which patient has continuously refused, Protonix 40 mg daily, Zofran 4 mg every 6 hours as needed x 2 doses in past 24 hours, and Compazine 5 mg every 6 hours x 1 dose in past 24 hours.  Scented to bedside to meet with patient.  Patient laying comfortably in the bed.  Introduced myself as a member of the palliative medicine team and my role in patient's medical  care.  At patient's request, spent time discussing inpatient palliative medicine support versus outpatient palliative medicine support (such as at [facility] versus home palliative).  At request also spent time discussing palliative medicine versus hospice. Patient inquired about such interventions as pain management at the end of life including palliative sedation.  Discussed appropriate circumstances for this such as uncontrolled pain that takes continuous IV medications for management.  Discussed [place] has not legalized physician assisted death, which is currently only legal in 10 states in the [place].  Did discuss palliative medicine team can assist with aggressive symptom management as one is undergoing aggressive medical interventions just as hospice can assist with aggressive symptom management at the end of life.  Then spent time reviewing patient's symptoms.  Patient feels that his pain and nausea are currently well-managed.  Spent time reviewing each symptom individually and the medications he is currently receiving for it. Regarding pain, patient describes it as being aching sometimes stabbing in his abdomen and worsening with constipation.  Patient does feel the Tylenol is assisting with overall management.  Noted going to change Tylenol to 1000 mg every 8 hours to minimize nighttime interruptions to allow patient to get rest to allow his body to heal.  Discussed use of methocarbamol which is meant as a muscle relaxant; not ideal for perforations current pain description so noted would decrease use to discontinue.  Patient notes he did not take this medicine continuously as an outpatient.  Discussed use of oxycodone which patient feels the 10 mg dose did assist with pain management.  Also discussed use of IV Dilaudid as  breakthrough for oral medication.  Discussed importance of staying on top of pain so that it does not become unbearable.  At this time going to attempt to schedule oxycodone 10 mg in  the evening as patient feels his pain is worse from 10 PM to 2 AM and hopefully having this scheduled will allow him to get some rest.  Will continue as needed oxycodone and IV Dilaudid as well.  With regards to nausea/vomiting, patient feels this is aggravated when he tries to eat.  Patient currently just using ice cubes to moisten his mouth and then spits out the ice so he does not make himself sick.  Patient feels that Zofran and Compazine are appropriately managing his nausea and vomiting as needed.  Patient has been refusing Reglan as he feels he took that medication years ago and it gave him a severe panic attack.  Did review use of Reglan in the situation though with patient continuing to be adverse, will discontinue at this time.  Noted would obtain EKG with multiple QTc prolonging medications in place.  Patient also noting constipation.  Discussed adjustment of bowel regimen.  Patient likely to receive enema today which this provider agrees with since received suppository earlier and did not have responsive bowel movement.  Will schedule patient on senna daily as well.  Would not use MiraLAX at this time as that is a lot of liquid for patient to have to maintain when having difficulty keeping fluids down.  Discussed non pharmaceutical interventions to assist with bowel movement such as ambulating as able.  Spent time providing emotional support via active listening.  All questions answered at that time.  Noted palliative medicine team will continue to follow along with patient's medical journey.  Updated IDT regarding discussion with patient and medication adjustments.  Primary Diagnoses  Present on Admission:  Partial gastric outlet obstruction  Adenocarcinoma of stomach (HCC)  Anxiety  BPH (benign prostatic hyperplasia)  Essential hypertension, benign  Gastric outlet obstruction   Palliative Review of Systems: Pain, nausea/vomiting, constipation  Past Medical History:  Diagnosis  Date   Anxiety    Benign prostatic hypertrophy    Diverticulosis of colon    GERD (gastroesophageal reflux disease)    Gilbert syndrome    History of kidney stones    Hypertension    Microscopic hematuria    Motion sickness    ocean boats, curvy roads   Nephrolithiasis     X 2; Dr Aldean Ast   Sleep apnea    USES CPAP   Syncope 02/2007   NOCTURNAL POST MICTURTION   Urticaria    Social History   Socioeconomic History   Marital status: Married    Spouse name: Not on file   Number of children: 0   Years of education: Not on file   Highest education level: Master's degree (e.g., MA, MS, MEng, MEd, MSW, MBA)  Occupational History   Not on file  Tobacco Use   Smoking status: Never   Smokeless tobacco: Never   Tobacco comments:    Second hand smoke  Vaping Use   Vaping status: Never Used  Substance and Sexual Activity   Alcohol use: No   Drug use: No   Sexual activity: Not on file  Other Topics Concern   Not on file  Social History Narrative   Right Handed   Lives in a two story home    Drinks some caffeine    Social Determinants of Health   Financial Resource Strain: Low  Risk  (08/12/2022)   Overall Financial Resource Strain (CARDIA)    Difficulty of Paying Living Expenses: Not hard at all  Food Insecurity: No Food Insecurity (10/14/2022)   Hunger Vital Sign    Worried About Running Out of Food in the Last Year: Never true    Ran Out of Food in the Last Year: Never true  Transportation Needs: No Transportation Needs (10/14/2022)   PRAPARE - Administrator, Civil Service (Medical): No    Lack of Transportation (Non-Medical): No  Physical Activity: Sufficiently Active (08/12/2022)   Exercise Vital Sign    Days of Exercise per Week: 6 days    Minutes of Exercise per Session: 50 min  Stress: Stress Concern Present (08/12/2022)   [employer] of Occupational Health - Occupational Stress Questionnaire    Feeling of Stress : To some extent  Social  Connections: Unknown (08/12/2022)   Social Connection and Isolation Panel [NHANES]    Frequency of Communication with Friends and Family: Once a week    Frequency of Social Gatherings with Friends and Family: Patient declined    Attends Religious Services: Never    Diplomatic Services operational officer: Not on file    Attends Engineer, structural: Not on file    Marital Status: Married   Family History  Problem Relation Age of Onset   Breast cancer Mother    Rheumatologic disease Mother        RA   Heart disease Father        PCV;MI ? 57   Polycythemia Father    Leukemia Paternal Uncle    Polycythemia Paternal Uncle    Heart attack Paternal Grandfather        > 55   Stroke Maternal Grandmother        . 65   Sleep apnea Sister    Diabetes Neg Hx    Colon cancer Neg Hx    Colon polyps Neg Hx    Esophageal cancer Neg Hx    Stomach cancer Neg Hx    Rectal cancer Neg Hx    Scheduled Meds:  acetaminophen  1,000 mg Oral Q6H   docusate sodium  100 mg Oral BID   enoxaparin (LOVENOX) injection  40 mg Subcutaneous Q24H   FLUoxetine  20 mg Oral Daily   irbesartan  75 mg Oral Daily   methocarbamol  500 mg Oral Q6H   metoCLOPramide (REGLAN) injection  10 mg Intravenous Q8H   pantoprazole (PROTONIX) IV  40 mg Intravenous Q24H   Continuous Infusions:  sodium chloride 50 mL/hr at 10/21/22 1907   feeding supplement (OSMOLITE 1.5 CAL) 1,000 mL (10/21/22 1328)   PRN Meds:.albuterol, HYDROmorphone (DILAUDID) injection, ondansetron (ZOFRAN) IV, oxyCODONE, polyethylene glycol, prochlorperazine, traZODone Allergies  Allergen Reactions   Ramipril Other (See Comments)    Elevated liver enzymes   Penicillins Other (See Comments)    ? Reaction (as child)   CBC:    Component Value Date/Time   WBC 5.1 10/22/2022 0547   HGB 13.6 10/22/2022 0547   HGB 14.7 10/14/2022 1320   HCT 41.3 10/22/2022 0547   PLT 173 10/22/2022 0547   PLT 205 10/14/2022 1320   MCV 90.8 10/22/2022 0547    NEUTROABS 4.4 10/14/2022 1320   LYMPHSABS 1.1 10/14/2022 1320   MONOABS 0.6 10/14/2022 1320   EOSABS 0.1 10/14/2022 1320   BASOSABS 0.1 10/14/2022 1320   Comprehensive Metabolic Panel:    Component Value Date/Time   NA 138 10/22/2022  0547   K 3.4 (L) 10/22/2022 0547   CL 102 10/22/2022 0547   CO2 26 10/22/2022 0547   BUN 32 (H) 10/22/2022 0547   CREATININE 0.72 10/22/2022 0547   CREATININE 1.01 10/14/2022 1320   CREATININE 1.20 12/13/2019 1219   GLUCOSE 163 (H) 10/22/2022 0547   CALCIUM 8.6 (L) 10/22/2022 0547   AST 15 10/22/2022 0547   AST 17 10/14/2022 1320   ALT 44 10/22/2022 0547   ALT 28 10/14/2022 1320   ALKPHOS 61 10/22/2022 0547   BILITOT 1.5 (H) 10/22/2022 0547   BILITOT 0.6 10/14/2022 1320   PROT 5.9 (L) 10/22/2022 0547   ALBUMIN 2.5 (L) 10/22/2022 0547    Physical Exam: Vital Signs: BP 128/85 (BP Location: Right Arm)   Pulse 96   Temp 98 F (36.7 C) (Oral)   Resp 18   Ht 6' (1.829 m)   Wt 73.6 kg   SpO2 95%   BMI 22.01 kg/m  SpO2: SpO2: 95 % O2 Device: O2 Device: Room Air O2 Flow Rate: O2 Flow Rate (L/min): 5 L/min Intake/output summary:  Intake/Output Summary (Last 24 hours) at 10/22/2022 1427 Last data filed at 10/21/2022 2000 Gross per 24 hour  Intake --  Output 250 ml  Net -250 ml   LBM: Last BM Date : 10/21/22 (small) Baseline Weight: Weight: 72.3 kg Most recent weight: Weight: 73.6 kg  General: NAD, alert, laying in bed, pleasant  Eyes: No drainage noted HENT: moist mucous membranes Cardiovascular: RRR Respiratory: no increased work of breathing noted, not in respiratory distress Abdomen: distended Neuro: A&Ox4, following commands easily Psych: appropriately answers all questions          Palliative Performance Scale: 50%              Additional Data Reviewed: Recent Labs    10/21/22 0952 10/22/22 0547  WBC 6.7 5.1  HGB 12.7* 13.6  PLT 170 173  NA 140 138  BUN 31* 32*  CREATININE 0.75 0.72    Imaging: DG Abd 1  View CLINICAL DATA:  Bowel obstruction.  EXAM: ABDOMEN - 1 VIEW  COMPARISON:  CT 10/11/2022  FINDINGS: Catheter overlying the left hemiabdomen. Multiple dilated air-filled loops of bowel again identified. Colonic loops measuring up to 7.8 cm and small bowel loops of 5.1 cm. Ileus versus developing obstruction is possible. No obvious free air seen beneath the diaphragm is clipped off the edge of the film. Again please correlate with patient's CT scan describing gastric pathology.  IMPRESSION: Catheter in the left midabdomen.  Dilated loops of small and large bowel diffusely. Ileus versus obstruction. Recommend close follow-up or additional workup as clinically appropriate  Electronically Signed   By: Karen Kays M.D.   On: 10/19/2022 18:29    I personally reviewed recent imaging.   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  Since admission, patient has received continued workup through oncology, GI, and surgery.  Patient underwent J-tube placement for management of tube feeds in setting of gastric obstruction.  Plan is for patient to start palliative immunotherapy after hospitalization.  Palliative medicine team consulted to assist  with complex medical decision making and symptom management.  # Complex medical decision making/goals of care  -Continues open discussions with his care team regarding pathways for medical care moving forward.  Currently plan is for patient to receive palliative immunotherapy in the outpatient setting.  -   Code Status: Full Code    -De. Feng rest CODE STATUS with patient on 7/29 who noted continuation of full code at this time though very open to further discussions about changing CODE STATUS to DNR should cancer directed therapies not go as hoped.  # Symptom management  -Pain, abdominal in the setting of metastatic gastric cancer with peritoneal mets and gastric outlet obstruction   -Change Tylenol to 1000 mg every 8 hours during the day to minimize nighttime interruptions   -Continue oxycodone 5-10 mg every 4 hours as needed   -Schedule oxycodone 10 mg daily at bedtime as patient notes this is when his pain is worse.  Will attempt using IR to determine effects on patient and then may need to consider transition to long-acting OxyContin.   -Change IV Dilaudid to 0.5 mg every 2 hours as needed for breakthrough after oral medications.   -Decrease methocarbamol 500 mg to twice daily from 4 times daily and attempt to wean patient off as this was not a home medication and does not assist with management current pain description patient has.   -Nausea/vomiting, in setting of metastatic gastric cancer with peritoneal mets and gastric outlet obstruction   -Discontinue Reglan at patient's request.  Patient noted believed history of panic attacks when received years ago.   -Continue Zofran 4 mg every 6 hours as needed   -Continue Compazine 5 mg every 6 hours as needed   -EKG ordered to monitor QTc with patient on multiple QTc prolonging medications   -Constipation   -Agree with use of enema today since patient did not have beneficial response to suppository   -Discontinue Colace as no medical benefit in this situation   -Start senna 2 tabs twice daily   -Will not start MiraLAX due to patient's difficulties with swallowing liquid at this time   -Encourage ambulation as able  # Psycho-social/Spiritual Support:  - Support System: significant other listed in EMR- [facility]  # Discharge Planning:  To Be  Determined  -If continues current medical pathway, will need outpatient palliative medicine follow referral either at [facility] or home.   Thank you for allowing the palliative care team to participate in the care Margarita Rana.  Alvester Morin, DO Palliative Care Provider PMT # [phone]  If patient remains symptomatic despite maximum doses, please call PMT at [phone] between 0700 and 1900. Outside of these hours, please call attending, as PMT does not have night coverage.  *Please note that this is a verbal dictation therefore any spelling or grammatical errors are due to the "[employer] One" system interpretation.

## 2022-10-22 NOTE — Progress Notes (Signed)
6 Days Post-Op   Subjective/Chief Complaint: Had trickle TF and a few hrs later had some right sided crampy pains and these were then paused. Passing flatus, had suppository yesterday with minimal result.   Objective: Vital signs in last 24 hours: Temp:  [97.7 F (36.5 C)-98.2 F (36.8 C)] 97.7 F (36.5 C) (07/30 0519) Pulse Rate:  [87-102] 102 (07/30 0519) Resp:  [14-20] 16 (07/30 0519) BP: (152-163)/(103-105) 152/105 (07/30 0519) SpO2:  [90 %-95 %] 90 % (07/30 0519) Last BM Date : 10/15/22  Intake/Output from previous day: 07/29 0701 - 07/30 0700 In: -  Out: 250 [Urine:250] Intake/Output this shift: No intake/output data recorded.  General appearance: alert and cooperative Resp: clear to auscultation bilaterally Cardio: regular rate and rhythm GI: soft, nontender; not significantly distended either  Lab Results:  Recent Labs    10/21/22 0952 10/22/22 0547  WBC 6.7 5.1  HGB 12.7* 13.6  HCT 38.2* 41.3  PLT 170 173   BMET Recent Labs    10/21/22 0952 10/22/22 0547  NA 140 138  K 3.9 3.4*  CL 103 102  CO2 28 26  GLUCOSE 134* 163*  BUN 31* 32*  CREATININE 0.75 0.72  CALCIUM 8.9 8.6*   PT/INR No results for input(s): "LABPROT", "INR" in the last 72 hours. ABG No results for input(s): "PHART", "HCO3" in the last 72 hours.  Invalid input(s): "PCO2", "PO2"  Studies/Results: No results found.  Anti-infectives: Anti-infectives (From admission, onward)    Start     Dose/Rate Route Frequency Ordered Stop   10/16/22 0730  cefoTEtan (CEFOTAN) 2 g in sodium chloride 0.9 % 100 mL IVPB        2 g 200 mL/hr over 30 Minutes Intravenous On call to O.R. 10/15/22 1051 10/16/22 1845       Assessment/Plan: s/p Procedure(s): DIAGNOSTIC LAPAROSCOPY, OPEN J TUBE PLACEMENT (N/A) INSERTION PORT-A-CATH (N/A) Metastatic gastric cancer with outlet obstruction We may not be able to do much about the nausea if his stomach is obstructed short of an ng tube  Suppository  today; see if we can get things moving from possible constipation standpoint. May try to resume tube feeds tomorrow.  We will follow with you   LOS: 7 days    Andria Meuse 10/22/2022

## 2022-10-22 NOTE — Plan of Care (Signed)

## 2022-10-23 ENCOUNTER — Inpatient Hospital Stay (HOSPITAL_COMMUNITY): Payer: Medicare PPO

## 2022-10-23 ENCOUNTER — Encounter: Payer: Self-pay | Admitting: Nurse Practitioner

## 2022-10-23 DIAGNOSIS — Z79899 Other long term (current) drug therapy: Secondary | ICD-10-CM

## 2022-10-23 DIAGNOSIS — C169 Malignant neoplasm of stomach, unspecified: Secondary | ICD-10-CM | POA: Diagnosis not present

## 2022-10-23 DIAGNOSIS — G893 Neoplasm related pain (acute) (chronic): Secondary | ICD-10-CM | POA: Diagnosis not present

## 2022-10-23 DIAGNOSIS — K311 Adult hypertrophic pyloric stenosis: Secondary | ICD-10-CM | POA: Diagnosis not present

## 2022-10-23 DIAGNOSIS — I10 Essential (primary) hypertension: Secondary | ICD-10-CM | POA: Diagnosis not present

## 2022-10-23 DIAGNOSIS — Z515 Encounter for palliative care: Secondary | ICD-10-CM | POA: Diagnosis not present

## 2022-10-23 DIAGNOSIS — E43 Unspecified severe protein-calorie malnutrition: Secondary | ICD-10-CM | POA: Diagnosis not present

## 2022-10-23 LAB — GLUCOSE, CAPILLARY
Glucose-Capillary: 120 mg/dL — ABNORMAL HIGH (ref 70–99)
Glucose-Capillary: 102 mg/dL — ABNORMAL HIGH (ref 70–99)
Glucose-Capillary: 113 mg/dL — ABNORMAL HIGH (ref 70–99)
Glucose-Capillary: 110 mg/dL — ABNORMAL HIGH (ref 70–99)

## 2022-10-23 MED ORDER — IOHEXOL 300 MG/ML  SOLN
100.0000 mL | Freq: Once | INTRAMUSCULAR | Status: AC | PRN
  Administered 2022-10-23: 100 mL via INTRAVENOUS

## 2022-10-23 MED ORDER — CHLORHEXIDINE GLUCONATE CLOTH 2 % EX PADS
6.0000 | MEDICATED_PAD | Freq: Every day | CUTANEOUS | Status: DC
  Administered 2022-10-24 – 2022-11-01 (×9): 6 via TOPICAL

## 2022-10-23 MED ORDER — SODIUM CHLORIDE 0.9 % IV BOLUS
1000.0000 mL | Freq: Once | INTRAVENOUS | Status: AC
  Administered 2022-10-23: 1000 mL via INTRAVENOUS

## 2022-10-23 NOTE — Progress Notes (Signed)
Received call form Dr. Leonides Schanz regarding CT scan results and ordered to stop tube feeding and use until further evaluation.  TF stopped, patient informed and expressed understanding.  Will continue to monitor.

## 2022-10-23 NOTE — Progress Notes (Addendum)
PROGRESS NOTE    Steven Wolf  UJW:[id number] DOB: 1955-07-14 DOA: 10/14/2022 PCP: Pincus Sanes, MD   Brief Narrative: Steven Wolf is a 67 y.o. male with a history of gastric adenocarcinoma, hypertension, depression.  Patient presented secondary to need for J-tube placement for poor oral intake secondary to gastric outlet obstruction.  Post G-tube placement, patient with inability to advance tube feeds.  General surgery consulted for concern of ileus versus bowel obstruction.   Assessment and Plan:  Gastric outlet obstruction Secondary to recently diagnosed gastric adenocarcinoma.  Patient was sent to the hospital for placement of a J-tube which was performed on 7/24.  Since placement, patient has not been tolerating advancement of tube feeds secondary to recurrent nausea and vomiting.  Nausea/vomiting Possible ileus versus obstruction Related to tube feeds.  X-ray imaging was concerning for ileus versus obstruction.  General surgery was consulted and recommended conservative management with treatment for constipation.  Patient with multiple attempts to readvance tube feeds without success and resultant recurrent nausea and vomiting.  KUB ordered today by medical oncology with concern for possible pneumatosis per radiology report. Patient with recent EUS. -CT abdomen/pelvis stat  Gastric adenocarcinoma Patient follows with Dr. Mosetta Putt as an outpatient with plan to start chemotherapy.  Primary hypertension -Continue irbesartan  GERD -Continue Protonix  Depression -Continue Prozac  Hypokalemia Potassium supplementation as needed  Severe malnutrition Dietitian recommendations (7/31): Monitor magnesium, potassium, and phosphorus for at least 3 days, MD to replete as needed, as pt is at risk for refeeding syndrome. Continue Osmolite 1.5 @ 20 ml/hr via J-tube. Recommend advance by 10 ml every 24 hours to goal rate of 65 ml/hr. Provides at goal: 2340 kcals, 97g protein  and 1188 ml H2O.   DVT prophylaxis: Lovenox Code Status:   Code Status: Full Code Family Communication: Wife at bedside Disposition Plan: Discharge pending ability for patient to tolerate tube feeds   Consultants:  General surgery [facility] Medical oncology Palliative care medicine  Procedures:  7/24: EUS  Antimicrobials: None    Subjective: Patient without significant issue overnight.  Did not tolerate prior attempts to advance his tube feeds.  Hopeful this morning that he will tolerate his tube feeds.  Objective: BP (!) 157/101 (BP Location: Left Arm)   Pulse (!) 102   Temp 98.6 F (37 C)   Resp 18   Ht 6' (1.829 m)   Wt 73.3 kg   SpO2 95%   BMI 21.92 kg/m   Examination:  General exam: Appears calm and comfortable Respiratory system: Clear to auscultation. Respiratory effort normal. Cardiovascular system: S1 & S2 heard, RRR. Gastrointestinal system: Abdomen is nondistended, soft and mildly tender. Central nervous system: Alert and oriented. No focal neurological deficits. Musculoskeletal: No edema. No calf tenderness Psychiatry: Judgement and insight appear normal. Mood & affect appropriate.    Data Reviewed: I have personally reviewed following labs and imaging studies  CBC Lab Results  Component Value Date   WBC 10.5 10/23/2022   RBC 4.28 10/23/2022   HGB 12.8 (L) 10/23/2022   HCT 39.3 10/23/2022   MCV 91.8 10/23/2022   MCH 29.9 10/23/2022   PLT 153 10/23/2022   MCHC 32.6 10/23/2022   RDW 13.4 10/23/2022   LYMPHSABS 1.1 10/14/2022   MONOABS 0.6 10/14/2022   EOSABS 0.1 10/14/2022   BASOSABS 0.1 10/14/2022     Last metabolic panel Lab Results  Component Value Date   NA 145 10/23/2022   K 3.3 (L) 10/23/2022   CL  105 10/23/2022   CO2 32 10/23/2022   BUN 34 (H) 10/23/2022   CREATININE 0.90 10/23/2022   GLUCOSE 131 (H) 10/23/2022   GFRNONAA >60 10/23/2022   GFRAA 73 12/13/2019   CALCIUM 8.6 (L) 10/23/2022   PHOS 1.4 (L)  10/18/2022   PROT 5.3 (L) 10/23/2022   ALBUMIN 2.2 (L) 10/23/2022   BILITOT 1.6 (H) 10/23/2022   ALKPHOS 61 10/23/2022   AST 17 10/23/2022   ALT 32 10/23/2022   ANIONGAP 8 10/23/2022    GFR: Estimated Creatinine Clearance: 80.3 mL/min (by C-G formula based on SCr of 0.9 mg/dL).  No results found for this or any previous visit (from the past 240 hour(s)).    Radiology Studies: No results found.    LOS: 8 days    Jacquelin Hawking, MD [facility] 10/23/2022, 5:35 PM   If 7PM-7AM, please contact night-coverage [email or web address]

## 2022-10-23 NOTE — Plan of Care (Signed)

## 2022-10-23 NOTE — Plan of Care (Signed)
Received call from Radiology regarding concern for extensive pneumatosis noted on CT scan today after placement of jejunostomy tube. Discussed results with Surgeon on call who noted he would review the imaging. Recommended holding feeds through the tube at this time.   Ulysees Barns, MD Department of Hematology/Oncology [facility] Phone: [phone] Pager: [phone] Email: [email or web address]

## 2022-10-23 NOTE — Progress Notes (Signed)
[name]   DOB:1955-10-10   HQ#:[id number]   UXL#:[id number]  Medical oncology follow-up note.  Subjective: Patient has persistent nausea and vomiting, with greenish liquid.  Tube feeding was held yesterday, he is nauseated to improve without tube feeds.  Feeding started again today, he developed nausea and vomiting again.  He has been feeling miserable.  He did have a small bowel movement after enema.  He does feel bloated.   Objective:  Vitals:   10/23/22 0410 10/23/22 1333  BP: (!) 140/99 (!) 157/101  Pulse: 93 (!) 102  Resp: 19 18  Temp: (!) 97.5 F (36.4 C) 98.6 F (37 C)  SpO2: 97% 95%    Body mass index is 21.92 kg/m.  Intake/Output Summary (Last 24 hours) at 10/23/2022 1846 Last data filed at 10/23/2022 1153 Gross per 24 hour  Intake 6900.32 ml  Output --  Net 6900.32 ml     Sclerae unicteric Abdomen bloated, with moderate diffuse tenderness, decreased bowel sounds.  MSK no focal spinal tenderness, no peripheral edema  Neuro nonfocal    CBG (last 3)  Recent Labs    10/23/22 0754 10/23/22 1146 10/23/22 1654  GLUCAP 102* 113* 110*     Labs:   Urine Studies No results for input(s): "UHGB", "CRYS" in the last 72 hours.  Invalid input(s): "UACOL", "UAPR", "USPG", "UPH", "UTP", "UGL", "UKET", "UBIL", "UNIT", "UROB", "ULEU", "UEPI", "UWBC", "URBC", "UBAC", "CAST", "UCOM", "BILUA"  Basic Metabolic Panel: Recent Labs  Lab 10/18/22 0758 10/19/22 0837 10/20/22 0933 10/21/22 0952 10/22/22 0547 10/23/22 0558  NA 134* 138 139 140 138 145  K 3.5 3.4* 3.8 3.9 3.4* 3.3*  CL 102 102 102 103 102 105  CO2 25 27 27 28 26  32  GLUCOSE 160* 178* 142* 134* 163* 131*  BUN 14 17 23  31* 32* 34*  CREATININE 0.73 0.81 0.79 0.75 0.72 0.90  CALCIUM 8.3* 8.6* 8.7* 8.9 8.6* 8.6*  MG 1.9 1.9  --   --   --   --   PHOS 1.4*  --   --   --   --   --    GFR Estimated Creatinine Clearance: 80.3 mL/min (by C-G formula based on SCr of 0.9 mg/dL). Liver Function Tests: Recent  Labs  Lab 10/19/22 0837 10/20/22 0933 10/21/22 0952 10/22/22 0547 10/23/22 0558  AST 85* 36 23 15 17   ALT 101* 80* 59* 44 32  ALKPHOS 74 65 67 61 61  BILITOT 1.1 1.4* 1.0 1.5* 1.6*  PROT 6.1* 6.1* 5.8* 5.9* 5.3*  ALBUMIN 3.1* 2.8* 2.6* 2.5* 2.2*   No results for input(s): "LIPASE", "AMYLASE" in the last 168 hours. No results for input(s): "AMMONIA" in the last 168 hours. Coagulation profile No results for input(s): "INR", "PROTIME" in the last 168 hours.  CBC: Recent Labs  Lab 10/18/22 0758 10/19/22 0837 10/21/22 0952 10/22/22 0547 10/23/22 0558  WBC 10.5 8.4 6.7 5.1 10.5  HGB 12.6* 14.5 12.7* 13.6 12.8*  HCT 36.6* 43.6 38.2* 41.3 39.3  MCV 88.4 90.3 90.5 90.8 91.8  PLT 152 182 170 173 153   Cardiac Enzymes: No results for input(s): "CKTOTAL", "CKMB", "CKMBINDEX", "TROPONINI" in the last 168 hours. BNP: Invalid input(s): "POCBNP" CBG: Recent Labs  Lab 10/22/22 2352 10/23/22 0406 10/23/22 0754 10/23/22 1146 10/23/22 1654  GLUCAP 137* 120* 102* 113* 110*   D-Dimer No results for input(s): "DDIMER" in the last 72 hours. Hgb A1c No results for input(s): "HGBA1C" in the last 72 hours. Lipid Profile No results for  input(s): "CHOL", "HDL", "LDLCALC", "TRIG", "CHOLHDL", "LDLDIRECT" in the last 72 hours. Thyroid function studies No results for input(s): "TSH", "T4TOTAL", "T3FREE", "THYROIDAB" in the last 72 hours.  Invalid input(s): "FREET3" Anemia work up No results for input(s): "VITAMINB12", "FOLATE", "FERRITIN", "TIBC", "IRON", "RETICCTPCT" in the last 72 hours. Microbiology No results found for this or any previous visit (from the past 240 hour(s)).    Studies:  DG Abd 1 View  Result Date: 10/23/2022 CLINICAL DATA:  Nausea vomiting EXAM: ABDOMEN - 1 VIEW COMPARISON:  10/19/2022, CT 10/11/2022 FINDINGS: Persistent multiple loops of dilated small bowel, measuring up to 5.1 cm. Abnormal appearing loop of small bowel in the left lower quadrant with an  appearance suggesting intramural air. Probable left lower quadrant jejunostomy tube with tip in the left mid abdomen. IMPRESSION: Persistent multiple loops of dilated small bowel suspicious for small bowel obstruction. Abnormal appearing loop of small bowel in the left lower quadrant with an appearance suggesting intramural air. CT is recommended for further evaluation. Critical Value/emergent results were called by telephone at the time of interpretation on 10/23/2022 at 5:40 pm to provider Dr. Caleb Popp, Who verbally acknowledged these results. Electronically Signed   By: Jasmine Pang M.D.   On: 10/23/2022 17:41    Assessment: 67 y.o. 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  We can also consider olanzapine at night.  He is currently on Zofran and Compazine -If he does have small bowel obstruction secondary to his peritoneal metastasis, then his prognosis would be extremely poor, he would unlikely be able to tolerate tube feeds, and we may needs to discuss hospice care.  -I communicated with Dr. Caleb Popp and Dr. Patterson Hammersmith after seeing him today -will f/u tomorrow   Steven Mood, MD 10/23/2022

## 2022-10-23 NOTE — Progress Notes (Signed)
7 Days Post-Op   Subjective/Chief Complaint: Doing well. Tolerating TF at present. No n/v.    Objective: Vital signs in last 24 hours: Temp:  [97.5 F (36.4 C)-98 F (36.7 C)] 97.5 F (36.4 C) (07/31 0410) Pulse Rate:  [93-107] 93 (07/31 0410) Resp:  [18-20] 19 (07/31 0410) BP: (128-141)/(85-100) 140/99 (07/31 0410) SpO2:  [94 %-97 %] 97 % (07/31 0410) Weight:  [73.3 kg] 73.3 kg (07/31 0418) Last BM Date : 10/22/22  Intake/Output from previous day: No intake/output data recorded. Intake/Output this shift: No intake/output data recorded.  General appearance: alert and cooperative Resp: clear to auscultation bilaterally Cardio: regular rate and rhythm GI: soft, nontender; not significantly distended either  Lab Results:  Recent Labs    10/22/22 0547 10/23/22 0558  WBC 5.1 10.5  HGB 13.6 12.8*  HCT 41.3 39.3  PLT 173 153   BMET Recent Labs    10/22/22 0547 10/23/22 0558  NA 138 145  K 3.4* 3.3*  CL 102 105  CO2 26 32  GLUCOSE 163* 131*  BUN 32* 34*  CREATININE 0.72 0.90  CALCIUM 8.6* 8.6*   PT/INR No results for input(s): "LABPROT", "INR" in the last 72 hours. ABG No results for input(s): "PHART", "HCO3" in the last 72 hours.  Invalid input(s): "PCO2", "PO2"  Studies/Results: No results found.  Anti-infectives: Anti-infectives (From admission, onward)    Start     Dose/Rate Route Frequency Ordered Stop   10/16/22 0730  cefoTEtan (CEFOTAN) 2 g in sodium chloride 0.9 % 100 mL IVPB        2 g 200 mL/hr over 30 Minutes Intravenous On call to O.R. 10/15/22 1051 10/16/22 1845       Assessment/Plan: s/p Procedure(s): DIAGNOSTIC LAPAROSCOPY, OPEN J TUBE PLACEMENT (N/A) INSERTION PORT-A-CATH (N/A) Metastatic gastric cancer with outlet obstruction We may not be able to do much about the nausea if his stomach is obstructed short of an ng tube  Cont TF at current rate, monitor to see how he is tolerating today  We will follow with you   LOS: 8  days    Andria Meuse 10/23/2022

## 2022-10-23 NOTE — Progress Notes (Signed)
Nutrition Follow-up  DOCUMENTATION CODES:   Severe malnutrition in context of chronic illness  INTERVENTION:   -Monitor magnesium, potassium, and phosphorus for at least 3 days, MD to replete as needed, as pt is at risk for refeeding syndrome. -Continue Osmolite 1.5 @ 20 ml/hr via J-tube. -Recommend advance by 10 ml every 24 hours to goal rate of 65 ml/hr. -Provides at goal: 2340 kcals, 97g protein and 1188 ml H2O.   16 hour feed recommendations:  -Osmolite 1.5 @ 95 ml/hr x 16 hours, provides 2280 kcals, 95g protein and 1158 ml H2O.   NUTRITION DIAGNOSIS:   Severe Malnutrition related to chronic illness, cancer and cancer related treatments as evidenced by percent weight loss, energy intake < or equal to 75% for > or equal to 1 month, mild fat depletion.  Ongoing.  GOAL:   Patient will meet greater than or equal to 90% of their needs  Not met yet.  MONITOR:   Labs, Weight trends, TF tolerance, I & O's   ASSESSMENT:   67 y.o. male with medical history of recent diagnosis of adenocarcinoma, staging is still pending.  Patient went to oncology today due to poor p.o. intake, he barely is able to drink 8 ounce of water and 1-2 shakes a day.  Due to this he was directly admitted to hospital per oncology request for PEG tube placement.  7/24: s/p upper EUS, s/p dx laparoscopy and open J-tube placement   Tube feeds have been resumed again today. Were off yesterday in anticipation of having a BM. Restarted at 20 ml/hr this morning. Per surgery note, continue at current rate.   Admission weight: 159 lbs Current weight: 161 lbs  Medications: Senokot  Labs reviewed: CBGs: 102-140 Low K   Diet Order:   Diet Order             Diet clear liquid Fluid consistency: Thin  Diet effective now                   EDUCATION NEEDS:   Education needs have been addressed  Skin:  Skin Assessment: Skin Integrity Issues: Skin Integrity Issues:: Incisions Incisions: 7/24  Last  BM:  7/30  Height:   Ht Readings from Last 1 Encounters:  10/16/22 6' (1.829 m)    Weight:   Wt Readings from Last 1 Encounters:  10/23/22 73.3 kg    BMI:  Body mass index is 21.92 kg/m.  Estimated Nutritional Needs:   Kcal:  2150-2350  Protein:  105-120g  Fluid:  2.1L/day  Tilda Franco, MS, RD, LDN Inpatient Clinical Dietitian Contact information available via Amion

## 2022-10-23 NOTE — Progress Notes (Signed)
Daily Progress Note   Patient Name: Steven Wolf       Date: 10/23/2022 DOB: Mar 26, 1955  Age: 67 y.o. MRN#: [id number] Attending Physician: Narda Bonds, MD Primary Care Physician: Pincus Sanes, MD Admit Date: 10/14/2022 Length of Stay: 8 days  Reason for Consultation/Follow-up: Establishing goals of care  Subjective:   CC: Patient notes slept well though a bit tired this morning. Following up regarding complex medical decision making and symptom management.   Subjective:  Reviewed EM prior to presenting to bedside.  At time of EM review patient had received IV Compazine 5 mg x 2 doses in past 24 hours.  Patient did receive dose of Robaxin this morning as weaning off medication.  Patient received oxycodone 10 mg scheduled last night to assist with pain management.  Presented to bedside to check on patient.  Patient laying comfortably in bed.  Patient feels his symptoms are well-controlled at this time.  Patient notes that his tube feeds have been increased for today so we will have to see how he tolerates this throughout the day.  Patient feels that the oxycodone 10 mg scheduled at night assisted with his pain management.  Discussed there is an option for long-acting OxyContin though patient feels comfortable with continuing short acting for nighttime dosing.  Patient still has as needed oxycodone as well.  Patient notes that he slept well though he does feel little tired this morning.  Discussed plan to discontinue Robaxin altogether today since weaning off this medication. Patient notes he had a good bowel movement yesterday and feels this helped overall with his pain.  All questions answered at that time.  Thanked patient for allowing me to visit with him today.  Noted palliative medicine team will continue to follow with patient's medical journey.  Review of Systems Symptoms managed currently  Objective:   Vital Signs:  BP (!) 140/99 (BP Location: Left Arm)   Pulse 93   Temp  (!) 97.5 F (36.4 C) (Oral)   Resp 19   Ht 6' (1.829 m)   Wt 73.3 kg   SpO2 97%   BMI 21.92 kg/m   Physical Exam: General: NAD, alert, laying in bed, pleasant  Eyes: No drainage noted HENT: moist mucous membranes Cardiovascular: RRR Respiratory: no increased work of breathing noted, not in respiratory distress Abdomen: distended Neuro: A&Ox4, following commands easily Psych: appropriately answers all questions  Imaging:  I personally reviewed recent imaging.   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 Since admission, patient has received continued workup through oncology, GI, and surgery. Patient underwent J-tube placement for management of tube feeds in setting of gastric obstruction. Plan is for patient to start palliative immunotherapy after hospitalization. Palliative medicine team consulted to assist with complex medical decision making and symptom management.   Recommendations/Plan: # Complex medical decision making/goals of care:      -Continues open discussions with his care team regarding pathways for medical care moving forward.  Currently plan is for patient to receive palliative immunotherapy in the outpatient setting.                -  Code Status: Full Code                                -  Dr. Mosetta Putt rest CODE STATUS with patient on 7/29 who noted continuation of full code at this time though very open to further discussions about changing CODE STATUS to DNR should cancer directed therapies not go as hoped.   # Symptom management                -Pain, abdominal in the setting of metastatic gastric  cancer with peritoneal mets and gastric outlet obstruction                               -Continue Tylenol to 1000 mg every 8 hours during the day to minimize nighttime interruptions                               -Continue oxycodone 5-10 mg every 4 hours as needed                               -Continue oxycodone 10 mg daily at bedtime as patient notes this is when his pain is worse.  Patient does not want to change from short-acting to long-acting at this time so will maintain current medication.                                -Continue IV Dilaudid to 0.5 mg every 2 hours as needed for breakthrough after oral medications.                               -Discontinue methocarbamol. Please do not restart.                   -Nausea/vomiting, in setting of metastatic gastric cancer with peritoneal mets and gastric outlet obstruction                               -Discontinued Reglan 7/30 at patient's request.  Patient noted believed history of panic attacks when received years ago.                               -Continue Zofran 4 mg every 6 hours as needed                               -Continue Compazine 5 mg every 6 hours as needed                               -EKG on 7/30 showed QTc of 452.                   -Constipation Patient noted BM on 7/30.                                -Discontinue Colace as no medical benefit in this situation                               -Continue senna 2  tabs twice daily                               -Will not start MiraLAX due to patient's difficulties with swallowing liquid at this time                               -Encourage ambulation as able   # Psycho-social/Spiritual Support:  - Support System: significant other listed in [facility]   # Discharge Planning:  To Be Determined                -If continues current medical pathway, will need outpatient palliative medicine follow referral either at [facility] or home.   Discussed with: patient, RN  Thank you for  allowing the palliative care team to participate in the care Steven Wolf.  Alvester Morin, DO Palliative Care Provider PMT # [phone]  If patient remains symptomatic despite maximum doses, please call PMT at [phone] between 0700 and 1900. Outside of these hours, please call attending, as PMT does not have night coverage.  *Please note that this is a verbal dictation therefore any spelling or grammatical errors are due to the "[employer]" system interpretation.

## 2022-10-24 ENCOUNTER — Inpatient Hospital Stay: Payer: Medicare PPO

## 2022-10-24 ENCOUNTER — Inpatient Hospital Stay: Payer: Medicare PPO | Attending: Hematology | Admitting: Nurse Practitioner

## 2022-10-24 DIAGNOSIS — C169 Malignant neoplasm of stomach, unspecified: Secondary | ICD-10-CM | POA: Diagnosis not present

## 2022-10-24 DIAGNOSIS — G893 Neoplasm related pain (acute) (chronic): Secondary | ICD-10-CM | POA: Diagnosis not present

## 2022-10-24 DIAGNOSIS — K311 Adult hypertrophic pyloric stenosis: Secondary | ICD-10-CM | POA: Diagnosis not present

## 2022-10-24 DIAGNOSIS — Z515 Encounter for palliative care: Secondary | ICD-10-CM | POA: Diagnosis not present

## 2022-10-24 LAB — COMPREHENSIVE METABOLIC PANEL
ALT: 26 U/L (ref 0–44)
AST: 14 U/L — ABNORMAL LOW (ref 15–41)
Albumin: 2.2 g/dL — ABNORMAL LOW (ref 3.5–5.0)
Alkaline Phosphatase: 74 U/L (ref 38–126)
Anion gap: 11 (ref 5–15)
BUN: 32 mg/dL — ABNORMAL HIGH (ref 8–23)
CO2: 27 mmol/L (ref 22–32)
Calcium: 8.7 mg/dL — ABNORMAL LOW (ref 8.9–10.3)
Chloride: 108 mmol/L (ref 98–111)
Creatinine, Ser: 0.79 mg/dL (ref 0.61–1.24)
GFR, Estimated: >60 mL/min (ref >60)
Glucose, Bld: 113 mg/dL — ABNORMAL HIGH (ref 70–99)
Potassium: 3.4 mmol/L — ABNORMAL LOW (ref 3.5–5.1)
Sodium: 146 mmol/L — ABNORMAL HIGH (ref 135–145)
Total Bilirubin: 1.5 mg/dL — ABNORMAL HIGH (ref 0.3–1.2)
Total Protein: 5.3 g/dL — ABNORMAL LOW (ref 6.5–8.1)

## 2022-10-24 LAB — GLUCOSE, CAPILLARY
Glucose-Capillary: 104 mg/dL — ABNORMAL HIGH (ref 70–99)
Glucose-Capillary: 105 mg/dL — ABNORMAL HIGH (ref 70–99)
Glucose-Capillary: 117 mg/dL — ABNORMAL HIGH (ref 70–99)
Glucose-Capillary: 134 mg/dL — ABNORMAL HIGH (ref 70–99)

## 2022-10-24 LAB — TRIGLYCERIDES: Triglycerides: 150 mg/dL — ABNORMAL HIGH (ref <150)

## 2022-10-24 LAB — MAGNESIUM: Magnesium: 2.2 mg/dL (ref 1.7–2.4)

## 2022-10-24 LAB — PHOSPHORUS: Phosphorus: 3.4 mg/dL (ref 2.5–4.6)

## 2022-10-24 MED ORDER — TRAVASOL 10 % IV SOLN
INTRAVENOUS | Status: AC
  Filled 2022-10-24: qty 550.8

## 2022-10-24 MED ORDER — IRBESARTAN 150 MG PO TABS
150.0000 mg | ORAL_TABLET | Freq: Every day | ORAL | Status: DC
  Administered 2022-10-24: 150 mg via ORAL
  Filled 2022-10-24: qty 1

## 2022-10-24 MED ORDER — SENNA 8.6 MG PO TABS
4.0000 | ORAL_TABLET | Freq: Two times a day (BID) | ORAL | Status: DC
  Administered 2022-10-24 – 2022-10-25 (×2): 34.4 mg via ORAL
  Filled 2022-10-24 (×2): qty 4

## 2022-10-24 MED ORDER — SODIUM CHLORIDE 0.9 % IV SOLN
2.0000 g | Freq: Two times a day (BID) | INTRAVENOUS | Status: DC
  Administered 2022-10-24 – 2022-11-01 (×17): 2 g via INTRAVENOUS
  Filled 2022-10-24 (×17): qty 12.5

## 2022-10-24 MED ORDER — METRONIDAZOLE 500 MG/100ML IV SOLN
500.0000 mg | Freq: Two times a day (BID) | INTRAVENOUS | Status: DC
  Administered 2022-10-24 – 2022-11-01 (×17): 500 mg via INTRAVENOUS
  Filled 2022-10-24 (×17): qty 100

## 2022-10-24 MED ORDER — INSULIN ASPART 100 UNIT/ML IJ SOLN
0.0000 [IU] | INTRAMUSCULAR | Status: DC
  Administered 2022-10-25 – 2022-10-26 (×5): 1 [IU] via SUBCUTANEOUS
  Administered 2022-10-26: 2 [IU] via SUBCUTANEOUS
  Administered 2022-10-26 – 2022-10-27 (×4): 1 [IU] via SUBCUTANEOUS
  Administered 2022-10-27: 2 [IU] via SUBCUTANEOUS

## 2022-10-24 MED ORDER — THIAMINE HCL 100 MG/ML IJ SOLN
100.0000 mg | INTRAMUSCULAR | Status: AC
  Administered 2022-10-25 – 2022-10-29 (×5): 100 mg via INTRAVENOUS
  Filled 2022-10-24 (×5): qty 2

## 2022-10-24 MED ORDER — THIAMINE HCL 100 MG/ML IJ SOLN
100.0000 mg | Freq: Once | INTRAMUSCULAR | Status: AC
  Administered 2022-10-24: 100 mg via INTRAVENOUS
  Filled 2022-10-24: qty 2

## 2022-10-24 NOTE — Progress Notes (Signed)
Nutrition Follow-up  DOCUMENTATION CODES:   Severe malnutrition in context of chronic illness  INTERVENTION:   -Monitor magnesium, potassium, and phosphorus for at least 3 days, MD to replete as needed, as pt is at risk for refeeding syndrome.   -TPN management per Pharmacy -Recommend 100 mg Thiamine daily x 5 days -Daily weights   Tube feeding goal: -Osmolite 1.5 @ 20 ml/hr via J-tube. -Recommend advance by 10 ml every 24 hours to goal rate of 65 ml/hr. -Provides at goal: 2340 kcals, 97g protein and 1188 ml H2O.  NUTRITION DIAGNOSIS:   Severe Malnutrition related to chronic illness, cancer and cancer related treatments as evidenced by percent weight loss, energy intake < or equal to 75% for > or equal to 1 month, mild fat depletion.  Ongoing.  GOAL:   Patient will meet greater than or equal to 90% of their needs  Not meeting.  MONITOR:   Labs, Weight trends, I & O's (TPN)  REASON FOR ASSESSMENT:   Consult New TPN/TNA  ASSESSMENT:   67 y.o. male with medical history of recent diagnosis of adenocarcinoma, staging is still pending.  Patient went to oncology today due to poor p.o. intake, he barely is able to drink 8 ounce of water and 1-2 shakes a day.  Due to this he was directly admitted to hospital per oncology request for PEG tube placement.  Per chat with Pharmacy, pt to start TPN today.  Tube feeding is currently off. Per surgery note, suspect pneumatosis of  J-tube site. Also has a degree of ileus. N/V is better now that tube feeds are off. Pt continues to be at refeeding syndrome risk given he has not been able to increase tube feeds to goal rate. Recommend Pharmacy start thiamine.  Admission weight: 159 lbs Current weight: 173 lbs I/Os since admit: +13L  Medications: Senokot  Labs reviewed: CBGs: 102-113 Elevated Na Low K TG 150  Diet Order:   Diet Order             Diet clear liquid Fluid consistency: Thin  Diet effective now                    EDUCATION NEEDS:   Education needs have been addressed  Skin:  Skin Assessment: Skin Integrity Issues: Skin Integrity Issues:: Incisions Incisions: 7/24  Last BM:  7/30  Height:   Ht Readings from Last 1 Encounters:  10/16/22 6' (1.829 m)    Weight:   Wt Readings from Last 1 Encounters:  10/24/22 78.7 kg    BMI:  Body mass index is 23.53 kg/m.  Estimated Nutritional Needs:   Kcal:  2150-2350  Protein:  105-120g  Fluid:  2.1L/day  Tilda Franco, MS, RD, LDN Inpatient Clinical Dietitian Contact information available via Amion

## 2022-10-24 NOTE — Progress Notes (Addendum)
Pharmacy Antibiotic Note  Steven Wolf is a 67 y.o. male admitted on 10/14/2022 with  an intra-abdominal infection . Pharmacy has been consulted for cefepime dosing.  The patient has a documented penicillin allergy but received cefotetan pre-op on 7/24 with no complications, so will proceed with cefepime therapy.   Plan: Start cefepime 2g IV q12hrs Start metronidazole 500mg  IV q12hrs  Monitor renal function and overall clinical status  De-escalate antibiotics as able   Height: 6' (182.9 cm) Weight: 78.7 kg (173 lb 8 oz) IBW/kg (Calculated) : 77.6  Temp (24hrs), Avg:98 F (36.7 C), Min:97.5 F (36.4 C), Max:98.6 F (37 C)  Recent Labs  Lab 10/18/22 0758 10/19/22 0837 10/20/22 0933 10/21/22 0952 10/22/22 0547 10/23/22 0558  WBC 10.5 8.4  --  6.7 5.1 10.5  CREATININE 0.73 0.81 0.79 0.75 0.72 0.90    Estimated Creatinine Clearance: 85 mL/min (by C-G formula based on SCr of 0.9 mg/dL).    Allergies  Allergen Reactions   Ramipril Other (See Comments)    Elevated liver enzymes   Penicillins Other (See Comments)    ? Reaction (as child)    Antimicrobials this admission: 8/1 cefepime >> 8/1 metronidazole >>  Dose adjustments this admission: N/A  Microbiology results: None   Thank you for allowing pharmacy to be a part of this patient's care.  Cherylin Mylar 10/24/2022 7:44 AM

## 2022-10-24 NOTE — Progress Notes (Addendum)
PHARMACY - TOTAL PARENTERAL NUTRITION CONSULT NOTE   Indication:  Gastric Outlet Obstruction  Patient Measurements: Height: 6' (182.9 cm) Weight: 78.7 kg (173 lb 8 oz) IBW/kg (Calculated) : 77.6 TPN AdjBW (KG): 72.3 Body mass index is 23.53 kg/m. Usual Weight: 72kg  Assessment:  67 YO male with metastatic gastric cancer and outlet obstruction. Patient had J-tube placement 7/24 due to poor oral intake secondary to gastric outlet obstruction. He has been unable to advance tube feeds since J-tube placement. KUB from 7/31 showed air-fluid levels of the small bowel and CT scan showed pneumatosis in the area around the J-tube. TPN to start in the setting of gastric outlet obstruction and inability to advance enteral feeding.  Glucose / Insulin: No DM hx CBGs 104-113 in the last 24hrs Electrolytes:  K slightly low at 3.4 Corrected calcium stable at 10.1 Renal:  Scr <1 Hepatic:  LFTs wnl T bili slightly high at 1.5 Albumin low at 2.2 Initial TGs 150--stable Intake / Output; MIVF:  NG/GT: +3283 mL/24 hrs UOP not recorded last 24 hrs  GI Imaging: - 7/19 a/p: Gastric wall thickening, consistent with known gastric cancer. Perigastric fat stranding, concerning for local invasion. New prominent subcentimeter gastrohepatic ligament and left para-aortic lymph nodes, concerning for nodal metastatic disease. - 7/31 a/p: Interval placement of jejunostomy catheter into the proximal jejunum. Pneumatosis of proximal jejunum beginning just distal to the jejunostomy catheter, extending into the mesentery. Mid and distal small bowel loops are distended without wall thickening or pneumatosis. Small amount of free intraperitoneal gas and scattered abdominal and pelvic ascites, new since previous, potentially related to recent surgery. Persistent gastric wall thickening in the body and antrum with gastrohepatic ligament adenopathy as previously noted, consistent with history of gastric carcinoma. Patchy  atelectasis or infiltrates posteriorly in both lung bases, new since previous. GI Surgeries / Procedures:  - 7/24: diagnostic laparoscopy, open J tube placement, port-a-cath insertion, upper EUS  Central access: port-a-cath placed 7/24 TPN start date: 8/1  Nutritional Goals: Goal TPN rate is 90 mL/hr (provides 110 g of protein and 2219 kcals per day)  RD Assessment: Estimated Needs Total Energy Estimated Needs: 2150-2350 Total Protein Estimated Needs: 105-120g Total Fluid Estimated Needs: 2.1L/day RD also recommended thiamine 100mg  IV daily x5 days due to risk for refeeding syndrome   Current Nutrition:  Clear liquids  Plan:  At 1800: - Start TPN at 45 mL/hr at 1800 (provides ~55g protein/day and 1109 kcals/day) - Electrolytes in TPN:  Na 65mEq/L K 65mEq/L Ca 50mEq/L Mg 24mEq/L Phos 92mmol/L Cl:Ac 1:1 - Add standard MVI and trace elements to TPN - Initiate Sensitive q4h SSI and adjust as needed  - Discontinue NS IVF at 1800 - Monitor TPN labs daily while escalating to goal rate and on Mon/Thurs - Start Thiamine 100mg  IV daily x5 days   Cherylin Mylar, PharmD Clinical Pharmacist  8/1/20248:22 AM

## 2022-10-24 NOTE — Plan of Care (Signed)

## 2022-10-24 NOTE — Progress Notes (Signed)
Daily Progress Note   Patient Name: Steven Wolf       Date: 10/24/2022 DOB: April 07, 1955  Age: 67 y.o. MRN#: [id number] Attending Physician: Narda Bonds, MD Primary Care Physician: Pincus Sanes, MD Admit Date: 10/14/2022 Length of Stay: 9 days  Reason for Consultation/Follow-up: Establishing goals of care  Subjective:   CC: Patient notes nausea currently improved after TF stopped. Following up regarding complex medical decision making and symptom management.   Subjective:  Reviewed EMR prior to presenting to bedside.  Patient had imaging including x-ray and CT yesterday showing concern for pneumatosis in vicinity of the J-tube.  Tube feeds have been stopped and patient has been started on empiric antibiotics.  Plan is for patient to receive TPN as a bridge at this time.  Presented to bedside to check on patient.  Patient notes his nausea and vomiting are improved after tube feeds were discontinued yesterday.  Patient notes he discussed plan with surgery team this morning to start on TPN for nutrition.  Patient denies any concerns of pain at this time.  Patient feels he again slept well with oxycodone dose in the evening.  Did inquire about patient's last bowel movement which he noted was on 7/30 after he received the enema.Will increase oral regimen as well today.   Spent time providing emotional support via active listening.  Patient notes he feels he is "making 1 step forward and 2 steps back continuously".  Offered empathetic listening.  Noted palliative medicine team and continue to follow along with patient's medical journey.  All questions answered at that time.  Review of Systems Adjusting bowel regimen with last BM 7/30 Objective:   Vital Signs:  BP (!) 152/98 (BP Location: Left Arm)   Pulse (!) 116   Temp (!) 97.5 F (36.4 C) (Oral)   Resp 18   Ht 6' (1.829 m)   Wt 78.7 kg   SpO2 94%   BMI 23.53 kg/m   Physical Exam: General: NAD, alert, laying in bed,  pleasant , chronically ill appearing Eyes: No drainage noted HENT: moist mucous membranes Cardiovascular: RRR Respiratory: no increased work of breathing noted, not in respiratory distress Abdomen: distended Neuro: A&Ox4, following commands easily Psych: appropriately answers all questions  Imaging:  I personally reviewed recent imaging.   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 Since admission, patient has received continued workup through oncology, GI, and surgery. Patient underwent J-tube placement for management of tube feeds in setting of gastric obstruction. Plan is for patient to start palliative immunotherapy after hospitalization. Palliative medicine team consulted to assist with complex medical decision making and symptom management.   Recommendations/Plan: # Complex medical decision making/goals of care:      -Continues open discussions with his care team regarding pathways for medical care moving forward.  Currently plan is for patient to receive palliative immunotherapy in the outpatient setting.                -  Code Status: Full Code                                -  Dr. Mosetta Putt already discussed CODE STATUS with patient on 7/29 who noted continuation of full code at this time though very open to further discussions about changing CODE STATUS to DNR should cancer directed therapies not go as hoped.   # Symptom management                -Pain, abdominal in the setting of metastatic gastric cancer with peritoneal mets and gastric outlet obstruction                               -Continue Tylenol to 1000 mg  every 8 hours during the day to minimize nighttime interruptions                               -Continue oxycodone 5-10 mg every 4 hours as needed                               -Continue oxycodone 10 mg daily at bedtime as patient notes this is when his pain is worse.  Patient does not want to change from short-acting to long-acting at this time so will maintain current medication.                                -Continue IV Dilaudid to 0.5 mg every 2 hours as needed for breakthrough after oral medications.                               -Discontinue methocarbamol. Please do not restart.                   -Nausea/vomiting, in setting of metastatic gastric cancer with peritoneal mets and gastric outlet obstruction                               -Discontinued Reglan 7/30 at patient's request.  Patient noted believed history of panic attacks when received years ago.                               -Continue Zofran 4 mg every 6 hours as needed                               -Continue Compazine 5 mg every 6 hours as needed                               -EKG on 7/30 showed QTc of 452.                   -Constipation Patient noted BM on 7/30.                                -Increase senna to 4 tabs twice daily                               -Will not start MiraLAX  due to patient's difficulties with swallowing liquid at this time                               -Encourage ambulation as able   # Psycho-social/Spiritual Support:  - Support System: significant other listed in EMR- [facility]   # Discharge Planning:  To Be Determined                -If continues current medical pathway, will need outpatient palliative medicine follow referral either at [facility] or home.   Discussed with: patient, RN  Thank you for allowing the palliative care team to participate in the care Margarita Rana.  Alvester Morin, DO Palliative Care Provider PMT # [phone]  If patient remains symptomatic despite maximum doses, please  call PMT at [phone] between 0700 and 1900. Outside of these hours, please call attending, as PMT does not have night coverage.  *Please note that this is a verbal dictation therefore any spelling or grammatical errors are due to the "[employer]" system interpretation.

## 2022-10-24 NOTE — Progress Notes (Addendum)
8 Days Post-Op   Subjective/Chief Complaint: Denies abdominal pain currently. Feels better since TF were held. States his vomiting is more like spit up and occurs when he eats Ice or tries to drink water. Denies urinary sxs. He is passing flatus. Denies BM since enema day before yesterday.   Objective: Vital signs in last 24 hours: Temp:  [97.5 F (36.4 C)-98.6 F (37 C)] 97.5 F (36.4 C) (08/01 1610) Pulse Rate:  [102-126] 116 (08/01 0638) Resp:  [13-20] 18 (08/01 9604) BP: (147-157)/(98-110) 152/98 (08/01 0638) SpO2:  [92 %-95 %] 94 % (08/01 5409) Weight:  [78.7 kg] 78.7 kg (08/01 0500) Last BM Date : 10/22/22  Intake/Output from previous day: 07/31 0701 - 08/01 0700 In: 6900.3 [I.V.:2556.7; NG/GT:3283.7; IV Piggyback:1000] Out: -  Intake/Output this shift: No intake/output data recorded.  General appearance: alert and cooperative Resp: normal effort on room air; per left chest wall Cardio: sinus tachycardia  GI: soft, nontender; mild distention, no rebound tenderness, no guarding.  Lab Results:  Recent Labs    10/22/22 0547 10/23/22 0558  WBC 5.1 10.5  HGB 13.6 12.8*  HCT 41.3 39.3  PLT 173 153   BMET Recent Labs    10/22/22 0547 10/23/22 0558  NA 138 145  K 3.4* 3.3*  CL 102 105  CO2 26 32  GLUCOSE 163* 131*  BUN 32* 34*  CREATININE 0.72 0.90  CALCIUM 8.6* 8.6*   PT/INR No results for input(s): "LABPROT", "INR" in the last 72 hours. ABG No results for input(s): "PHART", "HCO3" in the last 72 hours.  Invalid input(s): "PCO2", "PO2"  Studies/Results: CT ABDOMEN PELVIS W CONTRAST  Result Date: 10/23/2022 CLINICAL DATA:  Bowel obstruction suspected (Ped 5-17y) concern for pnuemotosis EXAM: CT ABDOMEN AND PELVIS WITH CONTRAST TECHNIQUE: Multidetector CT imaging of the abdomen and pelvis was performed using the standard protocol following bolus administration of intravenous contrast. RADIATION DOSE REDUCTION: This exam was performed according to the  departmental dose-optimization program which includes automated exposure control, adjustment of the mA and/or kV according to patient size and/or use of iterative reconstruction technique. CONTRAST:  OMNIPAQUE IOHEXOL 300 MG/ML  SOLN COMPARISON:  10/11/2022 FINDINGS: Lower chest: No pleural or pericardial effusion. Catheter extends to the proximal right atrium. Patchy atelectasis or infiltrates posteriorly in both lung bases, new since previous. Hepatobiliary: Stable probable cyst in hepatic segment 2. No new liver lesion or biliary ductal dilatation. Gallbladder is distended, without definite wall thickening. Pancreas: Unremarkable. No pancreatic ductal dilatation or surrounding inflammatory changes. Spleen: Normal in size without focal abnormality. Adrenals/Urinary Tract: No adrenal mass. Symmetric renal enhancement without hydronephrosis or worrisome lesion. Urinary bladder physiologically distended. Stomach/Bowel: Stomach is distended by gas and fluid. Despite that, there is wall thickening in the body and antrum as seen on prior study. The duodenum is distended. Multiple gas dilated loops of small bowel. Interval placement of jejunostomy catheter into the proximal jejunum. There is pneumatosis of proximal jejunum beginning just distal to the jejunostomy catheter and involving several loops. There is gas and fluid distending mid and distal small bowel, without other pneumatosis or wall thickening. Terminal ileum is nondilated. Ascending colon is partially distended, distal segments decompressed. Scattered distal descending and sigmoid diverticula are noted. Vascular/Lymphatic: Minimal plaque in the abdominal aorta without dissection, aneurysm, or stenosis. Portal vein patent. Gastrohepatic ligament adenopathy as previously noted. No other mesenteric, retroperitoneal, or pelvic adenopathy. Reproductive: Prostate enlargement Other: There is a small amount of free intraperitoneal intraperitoneal gas, and  scattered abdominal and  pelvic ascites, new since previous. Musculoskeletal: No acute or significant osseous findings. IMPRESSION: 1. Interval placement of jejunostomy catheter into the proximal jejunum. 2. Pneumatosis of proximal jejunum beginning just distal to the jejunostomy catheter, extending into the mesentery. Mid and distal small bowel loops are distended without wall thickening or pneumatosis. 3. Small amount of free intraperitoneal gas and scattered abdominal and pelvic ascites, new since previous, potentially related to recent surgery. Critical Value/emergent results were called by telephone at the time of interpretation on 10/23/2022 at 10:36 pm to provider Jeanie Sewer, who verbally acknowledged these results. 4. Persistent gastric wall thickening in the body and antrum with Gastrohepatic ligament adenopathy as previously noted, consistent with history of gastric carcinoma. 5. Patchy atelectasis or infiltrates posteriorly in both lung bases, new since previous. 6. Prostate enlargement. 7.  Aortic Atherosclerosis (ICD10-I70.0). Electronically Signed   By: Corlis Leak M.D.   On: 10/23/2022 22:38   DG Abd 1 View  Result Date: 10/23/2022 CLINICAL DATA:  Nausea vomiting EXAM: ABDOMEN - 1 VIEW COMPARISON:  10/19/2022, CT 10/11/2022 FINDINGS: Persistent multiple loops of dilated small bowel, measuring up to 5.1 cm. Abnormal appearing loop of small bowel in the left lower quadrant with an appearance suggesting intramural air. Probable left lower quadrant jejunostomy tube with tip in the left mid abdomen. IMPRESSION: Persistent multiple loops of dilated small bowel suspicious for small bowel obstruction. Abnormal appearing loop of small bowel in the left lower quadrant with an appearance suggesting intramural air. CT is recommended for further evaluation. Critical Value/emergent results were called by telephone at the time of interpretation on 10/23/2022 at 5:40 pm to provider Dr. Caleb Popp, Who verbally  acknowledged these results. Electronically Signed   By: Jasmine Pang M.D.   On: 10/23/2022 17:41    Anti-infectives: Anti-infectives (From admission, onward)    Start     Dose/Rate Route Frequency Ordered Stop   10/24/22 0900  ceFEPIme (MAXIPIME) 2 g in sodium chloride 0.9 % 100 mL IVPB        2 g 200 mL/hr over 30 Minutes Intravenous Every 12 hours 10/24/22 0805     10/24/22 0900  metroNIDAZOLE (FLAGYL) IVPB 500 mg        500 mg 100 mL/hr over 60 Minutes Intravenous Every 12 hours 10/24/22 0805     10/16/22 0730  cefoTEtan (CEFOTAN) 2 g in sodium chloride 0.9 % 100 mL IVPB        2 g 200 mL/hr over 30 Minutes Intravenous On call to O.R. 10/15/22 1051 10/16/22 1845       Assessment/Plan: s/p Procedure(s): DIAGNOSTIC LAPAROSCOPY, OPEN J TUBE PLACEMENT (N/A) INSERTION PORT-A-CATH (N/A) Metastatic gastric cancer with outlet obstruction Due to ongoing nausea/distention KUB was ordered yesterday that showed air-fluid levels of the small bowel.  CT scan was ordered and showed pneumatosis in the area around the J-tube. The patient has some sinus tachycardia but is afebrile and hypertensive. WBC 10 from 5. He is non-toxic appearing and is non-tender on exam. Low suspicion for bowel ischemia at this time. Hold tube feeds and start empiric antibiotics (cefepime/flagyl given PCN allergy). Monitor abdominal exam. TPN ordered until it is deemed to safe to resume enteral nutrition. Ice chips/sips for comfort is ok. No emergent need for surgery today.   We will follow with you   LOS: 9 days    Adam Phenix 10/24/2022

## 2022-10-24 NOTE — Progress Notes (Signed)
PROGRESS NOTE    Steven Wolf  ZOX:[id number] DOB: 12-Jun-1955 DOA: 10/14/2022 PCP: Pincus Sanes, MD   Brief Narrative: Steven Wolf is a 67 y.o. male with a history of gastric adenocarcinoma, hypertension, depression.  Patient presented secondary to need for J-tube placement for poor oral intake secondary to gastric outlet obstruction.  Post G-tube placement, patient with inability to advance tube feeds.  General surgery consulted for concern of ileus versus bowel obstruction. Patient continued to fail advancement of tube feed rate. Repeat imaging was concerning for development of pneumatosis. Tube feeds held and TPN started on 8/1.   Assessment and Plan:  Gastric outlet obstruction Secondary to recently diagnosed gastric adenocarcinoma.  Patient was sent to the hospital for placement of a J-tube which was performed on 7/24.  Since placement, patient has not been tolerating advancement of tube feeds secondary to recurrent nausea and vomiting.  Nausea/vomiting Possible ileus versus obstruction Pneumotosis Related to tube feeds.  X-ray imaging was concerning for ileus versus obstruction.  General surgery was consulted and recommended conservative management with treatment for constipation.  Patient with multiple attempts to readvance tube feeds without success and resultant recurrent nausea and vomiting.  KUB concerning for pneumatosis which was confirmed on CT scan. Per general surgery, likely related to J-tube placement -General surgery recommendations: CLD, stop tube feeds, TPN, Cefepime/Flagyl  Gastric adenocarcinoma Patient follows with Dr. Mosetta Putt as an outpatient with plan to start chemotherapy.  Primary hypertension Uncontrolled. Asymptomatic. -Increase to Irbesartan 150 mg daily  GERD -Continue Protonix  Depression -Continue Prozac  Hypokalemia Potassium supplementation as needed  Severe malnutrition Dietitian recommendations (7/31): Monitor magnesium,  potassium, and phosphorus for at least 3 days, MD to replete as needed, as pt is at risk for refeeding syndrome. Continue Osmolite 1.5 @ 20 ml/hr via J-tube. Recommend advance by 10 ml every 24 hours to goal rate of 65 ml/hr. Provides at goal: 2340 kcals, 97g protein and 1188 ml H2O.   DVT prophylaxis: Lovenox Code Status:   Code Status: Full Code Family Communication: Wife at bedside Disposition Plan: Discharge pending ability for patient to tolerate tube feeds and continued general surgery recommendations   Consultants:  General surgery [place] gastroenterology Medical oncology Palliative care medicine  Procedures:  7/24: EUS  Antimicrobials: Cefepime Flagyl    Subjective: Patient reports no nausea or vomiting today. Passing gas. No bowel movement. No significant abdominal pain.  Objective: BP (!) 157/107 (BP Location: Right Arm)   Pulse (!) 113   Temp 97.9 F (36.6 C) (Oral)   Resp 18   Ht 6' (1.829 m)   Wt 78.7 kg   SpO2 95%   BMI 23.53 kg/m   Examination:  General exam: Appears calm and comfortable Respiratory system: Clear to auscultation. Respiratory effort normal. Cardiovascular system: S1 & S2 heard, RRR. No murmurs, rubs, gallops or clicks. Gastrointestinal system: Abdomen is distended, soft and non-tender. Decreased bowel sounds heard. Central nervous system: Alert and oriented. No focal neurological deficits. Musculoskeletal: No calf tenderness Skin: No cyanosis. No rashes Psychiatry: Judgement and insight appear normal. Mood & affect appropriate.    Data Reviewed: I have personally reviewed following labs and imaging studies  CBC Lab Results  Component Value Date   WBC 10.5 10/23/2022   RBC 4.28 10/23/2022   HGB 12.8 (L) 10/23/2022   HCT 39.3 10/23/2022   MCV 91.8 10/23/2022   MCH 29.9 10/23/2022   PLT 153 10/23/2022   MCHC 32.6 10/23/2022   RDW 13.4 10/23/2022  LYMPHSABS 1.1 10/14/2022   MONOABS 0.6 10/14/2022   EOSABS 0.1 10/14/2022    BASOSABS 0.1 10/14/2022     Last metabolic panel Lab Results  Component Value Date   NA 146 (H) 10/24/2022   K 3.4 (L) 10/24/2022   CL 108 10/24/2022   CO2 27 10/24/2022   BUN 32 (H) 10/24/2022   CREATININE 0.79 10/24/2022   GLUCOSE 113 (H) 10/24/2022   GFRNONAA >60 10/24/2022   GFRAA 73 12/13/2019   CALCIUM 8.7 (L) 10/24/2022   PHOS 3.4 10/24/2022   PROT 5.3 (L) 10/24/2022   ALBUMIN 2.2 (L) 10/24/2022   BILITOT 1.5 (H) 10/24/2022   ALKPHOS 74 10/24/2022   AST 14 (L) 10/24/2022   ALT 26 10/24/2022   ANIONGAP 11 10/24/2022    GFR: Estimated Creatinine Clearance: 95.7 mL/min (by C-G formula based on SCr of 0.79 mg/dL).  No results found for this or any previous visit (from the past 240 hour(s)).    Radiology Studies: CT ABDOMEN PELVIS W CONTRAST  Result Date: 10/23/2022 CLINICAL DATA:  Bowel obstruction suspected (Ped 5-17y) concern for pnuemotosis EXAM: CT ABDOMEN AND PELVIS WITH CONTRAST TECHNIQUE: Multidetector CT imaging of the abdomen and pelvis was performed using the standard protocol following bolus administration of intravenous contrast. RADIATION DOSE REDUCTION: This exam was performed according to the departmental dose-optimization program which includes automated exposure control, adjustment of the mA and/or kV according to patient size and/or use of iterative reconstruction technique. CONTRAST:  OMNIPAQUE IOHEXOL 300 MG/ML  SOLN COMPARISON:  10/11/2022 FINDINGS: Lower chest: No pleural or pericardial effusion. Catheter extends to the proximal right atrium. Patchy atelectasis or infiltrates posteriorly in both lung bases, new since previous. Hepatobiliary: Stable probable cyst in hepatic segment 2. No new liver lesion or biliary ductal dilatation. Gallbladder is distended, without definite wall thickening. Pancreas: Unremarkable. No pancreatic ductal dilatation or surrounding inflammatory changes. Spleen: Normal in size without focal abnormality.  Adrenals/Urinary Tract: No adrenal mass. Symmetric renal enhancement without hydronephrosis or worrisome lesion. Urinary bladder physiologically distended. Stomach/Bowel: Stomach is distended by gas and fluid. Despite that, there is wall thickening in the body and antrum as seen on prior study. The duodenum is distended. Multiple gas dilated loops of small bowel. Interval placement of jejunostomy catheter into the proximal jejunum. There is pneumatosis of proximal jejunum beginning just distal to the jejunostomy catheter and involving several loops. There is gas and fluid distending mid and distal small bowel, without other pneumatosis or wall thickening. Terminal ileum is nondilated. Ascending colon is partially distended, distal segments decompressed. Scattered distal descending and sigmoid diverticula are noted. Vascular/Lymphatic: Minimal plaque in the abdominal aorta without dissection, aneurysm, or stenosis. Portal vein patent. Gastrohepatic ligament adenopathy as previously noted. No other mesenteric, retroperitoneal, or pelvic adenopathy. Reproductive: Prostate enlargement Other: There is a small amount of free intraperitoneal intraperitoneal gas, and scattered abdominal and pelvic ascites, new since previous. Musculoskeletal: No acute or significant osseous findings. IMPRESSION: 1. Interval placement of jejunostomy catheter into the proximal jejunum. 2. Pneumatosis of proximal jejunum beginning just distal to the jejunostomy catheter, extending into the mesentery. Mid and distal small bowel loops are distended without wall thickening or pneumatosis. 3. Small amount of free intraperitoneal gas and scattered abdominal and pelvic ascites, new since previous, potentially related to recent surgery. Critical Value/emergent results were called by telephone at the time of interpretation on 10/23/2022 at 10:36 pm to provider Jeanie Sewer, who verbally acknowledged these results. 4. Persistent gastric wall thickening  in the body  and antrum with Gastrohepatic ligament adenopathy as previously noted, consistent with history of gastric carcinoma. 5. Patchy atelectasis or infiltrates posteriorly in both lung bases, new since previous. 6. Prostate enlargement. 7.  Aortic Atherosclerosis (ICD10-I70.0). Electronically Signed   By: Corlis Leak M.D.   On: 10/23/2022 22:38   DG Abd 1 View  Result Date: 10/23/2022 CLINICAL DATA:  Nausea vomiting EXAM: ABDOMEN - 1 VIEW COMPARISON:  10/19/2022, CT 10/11/2022 FINDINGS: Persistent multiple loops of dilated small bowel, measuring up to 5.1 cm. Abnormal appearing loop of small bowel in the left lower quadrant with an appearance suggesting intramural air. Probable left lower quadrant jejunostomy tube with tip in the left mid abdomen. IMPRESSION: Persistent multiple loops of dilated small bowel suspicious for small bowel obstruction. Abnormal appearing loop of small bowel in the left lower quadrant with an appearance suggesting intramural air. CT is recommended for further evaluation. Critical Value/emergent results were called by telephone at the time of interpretation on 10/23/2022 at 5:40 pm to provider Dr. Caleb Popp, Who verbally acknowledged these results. Electronically Signed   By: Jasmine Pang M.D.   On: 10/23/2022 17:41      LOS: 9 days    Jacquelin Hawking, MD [facility] 10/24/2022, 1:42 PM   If 7PM-7AM, please contact night-coverage [email or web address]

## 2022-10-25 ENCOUNTER — Inpatient Hospital Stay: Payer: Medicare PPO

## 2022-10-25 DIAGNOSIS — G893 Neoplasm related pain (acute) (chronic): Secondary | ICD-10-CM | POA: Diagnosis not present

## 2022-10-25 DIAGNOSIS — K311 Adult hypertrophic pyloric stenosis: Secondary | ICD-10-CM | POA: Diagnosis not present

## 2022-10-25 DIAGNOSIS — Z515 Encounter for palliative care: Secondary | ICD-10-CM | POA: Diagnosis not present

## 2022-10-25 DIAGNOSIS — C169 Malignant neoplasm of stomach, unspecified: Secondary | ICD-10-CM | POA: Diagnosis not present

## 2022-10-25 LAB — GLUCOSE, CAPILLARY
Glucose-Capillary: 130 mg/dL — ABNORMAL HIGH (ref 70–99)
Glucose-Capillary: 131 mg/dL — ABNORMAL HIGH (ref 70–99)
Glucose-Capillary: 131 mg/dL — ABNORMAL HIGH (ref 70–99)
Glucose-Capillary: 136 mg/dL — ABNORMAL HIGH (ref 70–99)
Glucose-Capillary: 139 mg/dL — ABNORMAL HIGH (ref 70–99)
Glucose-Capillary: 139 mg/dL — ABNORMAL HIGH (ref 70–99)
Glucose-Capillary: 159 mg/dL — ABNORMAL HIGH (ref 70–99)

## 2022-10-25 MED ORDER — TRAVASOL 10 % IV SOLN
INTRAVENOUS | Status: AC
  Filled 2022-10-25: qty 734.4

## 2022-10-25 MED ORDER — POTASSIUM PHOSPHATES 15 MMOLE/5ML IV SOLN
15.0000 mmol | Freq: Once | INTRAVENOUS | Status: AC
  Administered 2022-10-25: 15 mmol via INTRAVENOUS
  Filled 2022-10-25: qty 5

## 2022-10-25 MED ORDER — HYDROMORPHONE HCL 1 MG/ML IJ SOLN
0.5000 mg | INTRAMUSCULAR | Status: DC | PRN
  Administered 2022-10-25: 1 mg via INTRAVENOUS
  Administered 2022-10-26 – 2022-10-27 (×2): 0.5 mg via INTRAVENOUS
  Filled 2022-10-25 (×3): qty 1

## 2022-10-25 MED ORDER — POTASSIUM CHLORIDE 10 MEQ/100ML IV SOLN
10.0000 meq | INTRAVENOUS | Status: AC
  Administered 2022-10-25 (×4): 10 meq via INTRAVENOUS
  Filled 2022-10-25 (×2): qty 100

## 2022-10-25 MED ORDER — ACETAMINOPHEN 10 MG/ML IV SOLN
1000.0000 mg | Freq: Four times a day (QID) | INTRAVENOUS | Status: AC
  Administered 2022-10-25 – 2022-10-26 (×4): 1000 mg via INTRAVENOUS
  Filled 2022-10-25 (×4): qty 100

## 2022-10-25 MED ORDER — IRBESARTAN 150 MG PO TABS
150.0000 mg | ORAL_TABLET | Freq: Every day | ORAL | Status: DC
  Administered 2022-10-25: 150 mg
  Filled 2022-10-25: qty 1

## 2022-10-25 MED ORDER — BISACODYL 10 MG RE SUPP
10.0000 mg | Freq: Every day | RECTAL | Status: DC
  Filled 2022-10-25: qty 1

## 2022-10-25 MED ORDER — SODIUM CHLORIDE 0.9 % IV BOLUS
1000.0000 mL | Freq: Once | INTRAVENOUS | Status: AC
  Administered 2022-10-25: 1000 mL via INTRAVENOUS

## 2022-10-25 NOTE — Progress Notes (Addendum)
PROGRESS NOTE    Steven Wolf  ZHY:[id number] DOB: 1955/11/27 DOA: 10/14/2022 PCP: Pincus Sanes, MD   Brief Narrative: Steven Wolf is a 67 y.o. male with a history of gastric adenocarcinoma, hypertension, depression.  Patient presented secondary to need for J-tube placement for poor oral intake secondary to gastric outlet obstruction.  Post G-tube placement, patient with inability to advance tube feeds.  General surgery consulted for concern of ileus versus bowel obstruction. Patient continued to fail advancement of tube feed rate. Repeat imaging was concerning for development of pneumatosis. Tube feeds held and TPN started on 8/1.   Assessment and Plan:  Gastric outlet obstruction Secondary to recently diagnosed gastric adenocarcinoma.  Patient was sent to the hospital for placement of a J-tube which was performed on 7/24.  Since placement, patient has not been tolerating advancement of tube feeds secondary to recurrent nausea and vomiting.  Nausea/vomiting Possible ileus versus obstruction Pneumotosis Related to tube feeds.  X-ray imaging was concerning for ileus versus obstruction.  General surgery was consulted and recommended conservative management with treatment for constipation.  Patient with multiple attempts to readvance tube feeds without success and resultant recurrent nausea and vomiting.  KUB concerning for pneumatosis which was confirmed on CT scan. Per general surgery, likely related to J-tube placement -General surgery recommendations: CLD, stop tube feeds, TPN, Cefepime/Flagyl  Gastric adenocarcinoma Patient follows with Dr. Mosetta Putt as an outpatient with plan to start chemotherapy.  Primary hypertension Improved control with increase of irbesartan dose. -Continue Irbesartan 150 mg daily  GERD -Continue Protonix  Depression -Continue Prozac  Hypokalemia Potassium supplementation as needed  Severe malnutrition Dietitian recommendations  (7/31): Monitor magnesium, potassium, and phosphorus for at least 3 days, MD to replete as needed, as pt is at risk for refeeding syndrome. Continue Osmolite 1.5 @ 20 ml/hr via J-tube. Recommend advance by 10 ml every 24 hours to goal rate of 65 ml/hr. Provides at goal: 2340 kcals, 97g protein and 1188 ml H2O.   DVT prophylaxis: Lovenox Code Status:   Code Status: Full Code Family Communication: Wife and sister at bedside Disposition Plan: Discharge pending ability for patient to tolerate tube feeds and continued general surgery recommendations   Consultants:  General surgery [facility] Medical oncology Palliative care medicine  Procedures:  7/24: EUS  Antimicrobials: Cefepime Flagyl    Subjective: Some nausea/vomiting yesterday. No significant symptoms this morning. Passing gas. No bowel movement.  Objective: BP (!) 149/92 (BP Location: Left Arm)   Pulse (!) 102   Temp 98.2 F (36.8 C) (Oral)   Resp 18   Ht 6' (1.829 m)   Wt 76.8 kg   SpO2 96%   BMI 22.96 kg/m   Examination:  General exam: Appears calm and comfortable Respiratory system: Clear to auscultation. Respiratory effort normal. Cardiovascular system: S1 & S2 heard, RRR. No murmurs, rubs, gallops or clicks. Gastrointestinal system: Abdomen is mildly distended, soft and minimally tender. Normal bowel sounds heard. Central nervous system: Alert and oriented. Psychiatry: Judgement and insight appear normal. Mood & affect appropriate.    Data Reviewed: I have personally reviewed following labs and imaging studies  CBC Lab Results  Component Value Date   WBC 10.5 10/23/2022   RBC 4.28 10/23/2022   HGB 12.8 (L) 10/23/2022   HCT 39.3 10/23/2022   MCV 91.8 10/23/2022   MCH 29.9 10/23/2022   PLT 153 10/23/2022   MCHC 32.6 10/23/2022   RDW 13.4 10/23/2022   LYMPHSABS 1.1 10/14/2022   MONOABS 0.6  10/14/2022   EOSABS 0.1 10/14/2022   BASOSABS 0.1 10/14/2022     Last metabolic  panel Lab Results  Component Value Date   NA 144 10/25/2022   K 3.0 (L) 10/25/2022   CL 108 10/25/2022   CO2 29 10/25/2022   BUN 30 (H) 10/25/2022   CREATININE 0.75 10/25/2022   GLUCOSE 158 (H) 10/25/2022   GFRNONAA >60 10/25/2022   GFRAA 73 12/13/2019   CALCIUM 7.9 (L) 10/25/2022   PHOS 2.4 (L) 10/25/2022   PROT 4.7 (L) 10/25/2022   ALBUMIN 1.9 (L) 10/25/2022   BILITOT 1.3 (H) 10/25/2022   ALKPHOS 70 10/25/2022   AST 18 10/25/2022   ALT 23 10/25/2022   ANIONGAP 7 10/25/2022    GFR: Estimated Creatinine Clearance: 94.7 mL/min (by C-G formula based on SCr of 0.75 mg/dL).  No results found for this or any previous visit (from the past 240 hour(s)).    Radiology Studies: CT ABDOMEN PELVIS W CONTRAST  Result Date: 10/23/2022 CLINICAL DATA:  Bowel obstruction suspected (Ped 5-17y) concern for pnuemotosis EXAM: CT ABDOMEN AND PELVIS WITH CONTRAST TECHNIQUE: Multidetector CT imaging of the abdomen and pelvis was performed using the standard protocol following bolus administration of intravenous contrast. RADIATION DOSE REDUCTION: This exam was performed according to the departmental dose-optimization program which includes automated exposure control, adjustment of the mA and/or kV according to patient size and/or use of iterative reconstruction technique. CONTRAST:  OMNIPAQUE IOHEXOL 300 MG/ML  SOLN COMPARISON:  10/11/2022 FINDINGS: Lower chest: No pleural or pericardial effusion. Catheter extends to the proximal right atrium. Patchy atelectasis or infiltrates posteriorly in both lung bases, new since previous. Hepatobiliary: Stable probable cyst in hepatic segment 2. No new liver lesion or biliary ductal dilatation. Gallbladder is distended, without definite wall thickening. Pancreas: Unremarkable. No pancreatic ductal dilatation or surrounding inflammatory changes. Spleen: Normal in size without focal abnormality. Adrenals/Urinary Tract: No adrenal mass. Symmetric renal enhancement  without hydronephrosis or worrisome lesion. Urinary bladder physiologically distended. Stomach/Bowel: Stomach is distended by gas and fluid. Despite that, there is wall thickening in the body and antrum as seen on prior study. The duodenum is distended. Multiple gas dilated loops of small bowel. Interval placement of jejunostomy catheter into the proximal jejunum. There is pneumatosis of proximal jejunum beginning just distal to the jejunostomy catheter and involving several loops. There is gas and fluid distending mid and distal small bowel, without other pneumatosis or wall thickening. Terminal ileum is nondilated. Ascending colon is partially distended, distal segments decompressed. Scattered distal descending and sigmoid diverticula are noted. Vascular/Lymphatic: Minimal plaque in the abdominal aorta without dissection, aneurysm, or stenosis. Portal vein patent. Gastrohepatic ligament adenopathy as previously noted. No other mesenteric, retroperitoneal, or pelvic adenopathy. Reproductive: Prostate enlargement Other: There is a small amount of free intraperitoneal intraperitoneal gas, and scattered abdominal and pelvic ascites, new since previous. Musculoskeletal: No acute or significant osseous findings. IMPRESSION: 1. Interval placement of jejunostomy catheter into the proximal jejunum. 2. Pneumatosis of proximal jejunum beginning just distal to the jejunostomy catheter, extending into the mesentery. Mid and distal small bowel loops are distended without wall thickening or pneumatosis. 3. Small amount of free intraperitoneal gas and scattered abdominal and pelvic ascites, new since previous, potentially related to recent surgery. Critical Value/emergent results were called by telephone at the time of interpretation on 10/23/2022 at 10:36 pm to provider Jeanie Sewer, who verbally acknowledged these results. 4. Persistent gastric wall thickening in the body and antrum with Gastrohepatic ligament adenopathy as  previously  noted, consistent with history of gastric carcinoma. 5. Patchy atelectasis or infiltrates posteriorly in both lung bases, new since previous. 6. Prostate enlargement. 7.  Aortic Atherosclerosis (ICD10-I70.0). Electronically Signed   By: Corlis Leak M.D.   On: 10/23/2022 22:38   DG Abd 1 View  Result Date: 10/23/2022 CLINICAL DATA:  Nausea vomiting EXAM: ABDOMEN - 1 VIEW COMPARISON:  10/19/2022, CT 10/11/2022 FINDINGS: Persistent multiple loops of dilated small bowel, measuring up to 5.1 cm. Abnormal appearing loop of small bowel in the left lower quadrant with an appearance suggesting intramural air. Probable left lower quadrant jejunostomy tube with tip in the left mid abdomen. IMPRESSION: Persistent multiple loops of dilated small bowel suspicious for small bowel obstruction. Abnormal appearing loop of small bowel in the left lower quadrant with an appearance suggesting intramural air. CT is recommended for further evaluation. Critical Value/emergent results were called by telephone at the time of interpretation on 10/23/2022 at 5:40 pm to provider Dr. Caleb Popp, Who verbally acknowledged these results. Electronically Signed   By: Jasmine Pang M.D.   On: 10/23/2022 17:41      LOS: 10 days    Jacquelin Hawking, MD [facility] 10/25/2022, 10:18 AM   If 7PM-7AM, please contact night-coverage [email or web address]

## 2022-10-25 NOTE — Progress Notes (Signed)
PHARMACY - TOTAL PARENTERAL NUTRITION CONSULT NOTE   Indication:  Gastric Outlet Obstruction  Patient Measurements: Height: 6' (182.9 cm) Weight: 76.8 kg (169 lb 5 oz) IBW/kg (Calculated) : 77.6 TPN AdjBW (KG): 72.3 Body mass index is 22.96 kg/m. Usual Weight: 72kg  Assessment:  67 YO male with metastatic gastric cancer and outlet obstruction. Patient had J-tube placement 7/24 due to poor oral intake secondary to gastric outlet obstruction. He has been unable to advance tube feeds since J-tube placement. KUB from 7/31 showed air-fluid levels of the small bowel and CT scan showed pneumatosis in the area around the J-tube. TPN to start in the setting of gastric outlet obstruction and inability to advance enteral feeding.  Glucose / Insulin: No DM hx CBGs 134-159 since starting TPN 8/1 (no insulin used) Electrolytes:  K low at 3 Phos low at 2.4 Corrected calcium stable at 9.6 Renal:  Scr <1 Hepatic:  LFTs wnl T bili improved to 1.3 Albumin low at 1.9 Initial TGs 150--stable Intake / Output; MIVF:  UOP: -400 mL/24 hrs PO: +120 mL/24 hrs GI Imaging: - 7/19 a/p: Gastric wall thickening, consistent with known gastric cancer. Perigastric fat stranding, concerning for local invasion. New prominent subcentimeter gastrohepatic ligament and left para-aortic lymph nodes, concerning for nodal metastatic disease. - 7/31 a/p: Interval placement of jejunostomy catheter into the proximal jejunum. Pneumatosis of proximal jejunum beginning just distal to the jejunostomy catheter, extending into the mesentery. Mid and distal small bowel loops are distended without wall thickening or pneumatosis. Small amount of free intraperitoneal gas and scattered abdominal and pelvic ascites, new since previous, potentially related to recent surgery. Persistent gastric wall thickening in the body and antrum with gastrohepatic ligament adenopathy as previously noted, consistent with history of gastric carcinoma.  Patchy atelectasis or infiltrates posteriorly in both lung bases, new since previous. GI Surgeries / Procedures: - 7/24: diagnostic laparoscopy, open J tube placement, port-a-cath insertion, upper EUS  Central access: port-a-cath placed 7/24 TPN start date: 8/1  Nutritional Goals: Goal TPN rate is 90 mL/hr (provides 110 g of protein and 2219 kcals per day)  RD Assessment: Estimated Needs Total Energy Estimated Needs: 2150-2350 Total Protein Estimated Needs: 105-120g Total Fluid Estimated Needs: 2.1L/day RD also recommended thiamine 100mg  IV daily x5 days due to risk for refeeding syndrome   Current Nutrition:  Clear liquids and TPN  Plan:  Now: - Give IV KCl x4 - Give IV Kphos x1  At 1800: - Slightly increase TPN rate to 60 mL/hr at 1800 (provides 7.34 g protein/day and 1484 kcals/day) - Electrolytes in TPN:  Na 64mEq/L K 76mEq/L Ca 29mEq/L Mg 52mEq/L Phos 88mmol/L Cl:Ac 1:1 - Add standard MVI and trace elements to TPN - Continue Sensitive q4h SSI and adjust as needed  - Monitor TPN labs daily while escalating to goal rate and on Mon/Thurs - Continue Thiamine 100mg  IV daily x5 days   Cherylin Mylar, PharmD Clinical Pharmacist  8/2/20247:10 AM

## 2022-10-25 NOTE — Plan of Care (Signed)

## 2022-10-25 NOTE — Progress Notes (Signed)
Steven Wolf   DOB:1956-01-27   AO#:[id number]   ONG#:[id number]  Medical oncology follow-up note.  Subjective: Patient has persistent nausea and vomiting, with greenish liquid.  Tube feeding was held yesterday, he is nauseated to improve without tube feeds.  Feeding started again today, he developed nausea and vomiting again.  He has been feeling miserable.  He did have a small bowel movement after enema.  He does feel bloated.   Objective:  Vitals:   10/25/22 0436 10/25/22 1335  BP: (!) 149/92 (!) 155/101  Pulse: (!) 102 96  Resp: 18 18  Temp: 98.2 F (36.8 C) 97.9 F (36.6 C)  SpO2: 96% 96%    Body mass index is 22.96 kg/m.  Intake/Output Summary (Last 24 hours) at 10/25/2022 1524 Last data filed at 10/25/2022 1108 Gross per 24 hour  Intake 120 ml  Output 550 ml  Net -430 ml     Sclerae unicteric Abdomen bloated, with moderate diffuse tenderness, decreased bowel sounds.  MSK no focal spinal tenderness, no peripheral edema  Neuro nonfocal    CBG (last 3)  Recent Labs    10/25/22 0440 10/25/22 0734 10/25/22 1129  GLUCAP 159* 136* 131*     Labs:   Urine Studies No results for input(s): "UHGB", "CRYS" in the last 72 hours.  Invalid input(s): "UACOL", "UAPR", "USPG", "UPH", "UTP", "UGL", "UKET", "UBIL", "UNIT", "UROB", "ULEU", "UEPI", "UWBC", "URBC", "UBAC", "CAST", "UCOM", "BILUA"  Basic Metabolic Panel: Recent Labs  Lab 10/19/22 0837 10/20/22 0933 10/21/22 0952 10/22/22 0547 10/23/22 0558 10/24/22 0924 10/25/22 0500  NA 138   < > 140 138 145 146* 144  K 3.4*   < > 3.9 3.4* 3.3* 3.4* 3.0*  CL 102   < > 103 102 105 108 108  CO2 27   < > 28 26 32 27 29  GLUCOSE 178*   < > 134* 163* 131* 113* 158*  BUN 17   < > 31* 32* 34* 32* 30*  CREATININE 0.81   < > 0.75 0.72 0.90 0.79 0.75  CALCIUM 8.6*   < > 8.9 8.6* 8.6* 8.7* 7.9*  MG 1.9  --   --   --   --  2.2 2.1  PHOS  --   --   --   --   --  3.4 2.4*   < > = values in this interval not displayed.    GFR Estimated Creatinine Clearance: 94.7 mL/min (by C-G formula based on SCr of 0.75 mg/dL). Liver Function Tests: Recent Labs  Lab 10/21/22 0952 10/22/22 0547 10/23/22 0558 10/24/22 0924 10/25/22 0500  AST 23 15 17  14* 18  ALT 59* 44 32 26 23  ALKPHOS 67 61 61 74 70  BILITOT 1.0 1.5* 1.6* 1.5* 1.3*  PROT 5.8* 5.9* 5.3* 5.3* 4.7*  ALBUMIN 2.6* 2.5* 2.2* 2.2* 1.9*   No results for input(s): "LIPASE", "AMYLASE" in the last 168 hours. No results for input(s): "AMMONIA" in the last 168 hours. Coagulation profile No results for input(s): "INR", "PROTIME" in the last 168 hours.  CBC: Recent Labs  Lab 10/19/22 0837 10/21/22 0952 10/22/22 0547 10/23/22 0558  WBC 8.4 6.7 5.1 10.5  HGB 14.5 12.7* 13.6 12.8*  HCT 43.6 38.2* 41.3 39.3  MCV 90.3 90.5 90.8 91.8  PLT 182 170 173 153   Cardiac Enzymes: No results for input(s): "CKTOTAL", "CKMB", "CKMBINDEX", "TROPONINI" in the last 168 hours. BNP: Invalid input(s): "POCBNP" CBG: Recent Labs  Lab 10/24/22 2009 10/25/22 0039 10/25/22 0440 10/25/22  1191 10/25/22 1129  GLUCAP 134* 139* 159* 136* 131*   D-Dimer No results for input(s): "DDIMER" in the last 72 hours. Hgb A1c No results for input(s): "HGBA1C" in the last 72 hours. Lipid Profile Recent Labs    10/24/22 0924  TRIG 150*   Thyroid function studies No results for input(s): "TSH", "T4TOTAL", "T3FREE", "THYROIDAB" in the last 72 hours.  Invalid input(s): "FREET3" Anemia work up No results for input(s): "VITAMINB12", "FOLATE", "FERRITIN", "TIBC", "IRON", "RETICCTPCT" in the last 72 hours. Microbiology No results found for this or any previous visit (from the past 240 hour(s)).    Studies:  CT ABDOMEN PELVIS W CONTRAST  Result Date: 10/23/2022 CLINICAL DATA:  Bowel obstruction suspected (Ped 5-17y) concern for pnuemotosis EXAM: CT ABDOMEN AND PELVIS WITH CONTRAST TECHNIQUE: Multidetector CT imaging of the abdomen and pelvis was performed using the  standard protocol following bolus administration of intravenous contrast. RADIATION DOSE REDUCTION: This exam was performed according to the departmental dose-optimization program which includes automated exposure control, adjustment of the mA and/or kV according to patient size and/or use of iterative reconstruction technique. CONTRAST:  OMNIPAQUE IOHEXOL 300 MG/ML  SOLN COMPARISON:  10/11/2022 FINDINGS: Lower chest: No pleural or pericardial effusion. Catheter extends to the proximal right atrium. Patchy atelectasis or infiltrates posteriorly in both lung bases, new since previous. Hepatobiliary: Stable probable cyst in hepatic segment 2. No new liver lesion or biliary ductal dilatation. Gallbladder is distended, without definite wall thickening. Pancreas: Unremarkable. No pancreatic ductal dilatation or surrounding inflammatory changes. Spleen: Normal in size without focal abnormality. Adrenals/Urinary Tract: No adrenal mass. Symmetric renal enhancement without hydronephrosis or worrisome lesion. Urinary bladder physiologically distended. Stomach/Bowel: Stomach is distended by gas and fluid. Despite that, there is wall thickening in the body and antrum as seen on prior study. The duodenum is distended. Multiple gas dilated loops of small bowel. Interval placement of jejunostomy catheter into the proximal jejunum. There is pneumatosis of proximal jejunum beginning just distal to the jejunostomy catheter and involving several loops. There is gas and fluid distending mid and distal small bowel, without other pneumatosis or wall thickening. Terminal ileum is nondilated. Ascending colon is partially distended, distal segments decompressed. Scattered distal descending and sigmoid diverticula are noted. Vascular/Lymphatic: Minimal plaque in the abdominal aorta without dissection, aneurysm, or stenosis. Portal vein patent. Gastrohepatic ligament adenopathy as previously noted. No other mesenteric, retroperitoneal,  or pelvic adenopathy. Reproductive: Prostate enlargement Other: There is a small amount of free intraperitoneal intraperitoneal gas, and scattered abdominal and pelvic ascites, new since previous. Musculoskeletal: No acute or significant osseous findings. IMPRESSION: 1. Interval placement of jejunostomy catheter into the proximal jejunum. 2. Pneumatosis of proximal jejunum beginning just distal to the jejunostomy catheter, extending into the mesentery. Mid and distal small bowel loops are distended without wall thickening or pneumatosis. 3. Small amount of free intraperitoneal gas and scattered abdominal and pelvic ascites, new since previous, potentially related to recent surgery. Critical Value/emergent results were called by telephone at the time of interpretation on 10/23/2022 at 10:36 pm to provider Jeanie Sewer, who verbally acknowledged these results. 4. Persistent gastric wall thickening in the body and antrum with Gastrohepatic ligament adenopathy as previously noted, consistent with history of gastric carcinoma. 5. Patchy atelectasis or infiltrates posteriorly in both lung bases, new since previous. 6. Prostate enlargement. 7.  Aortic Atherosclerosis (ICD10-I70.0). Electronically Signed   By: Corlis Leak M.D.   On: 10/23/2022 22:38   DG Abd 1 View  Result Date: 10/23/2022 CLINICAL DATA:  Nausea vomiting EXAM: ABDOMEN - 1 VIEW COMPARISON:  10/19/2022, CT 10/11/2022 FINDINGS: Persistent multiple loops of dilated small bowel, measuring up to 5.1 cm. Abnormal appearing loop of small bowel in the left lower quadrant with an appearance suggesting intramural air. Probable left lower quadrant jejunostomy tube with tip in the left mid abdomen. IMPRESSION: Persistent multiple loops of dilated small bowel suspicious for small bowel obstruction. Abnormal appearing loop of small bowel in the left lower quadrant with an appearance suggesting intramural air. CT is recommended for further evaluation. Critical  Value/emergent results were called by telephone at the time of interpretation on 10/23/2022 at 5:40 pm to provider Dr. Caleb Popp, Who verbally acknowledged these results. Electronically Signed   By: Jasmine Pang M.D.   On: 10/23/2022 17:41    Assessment: 67 y.o. 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  His nausea has improve -General surgeon recommend try TF one more time to see if he can tolerate, he is on antibiotics also -I explained to patient that if he is not able to tolerate the tube feeds, he can go home with TPN, will set up home care for that.  He can try immunotherapy on TPN after discharge. -Continue symptom management, appreciated Perative care's assistance -I will f/u on Monday      [name], MD 10/25/2022

## 2022-10-25 NOTE — Progress Notes (Addendum)
9 Days Post-Op   Subjective/Chief Complaint: Denies abdominal pain. He was able to tolerate ice chips and 1/3 of a popsicle yesterday - felt fine during the day but in the evening he felt like all the liquid in his stomach backed up throughout the day causing nausea, pressure, small volume emesis in the evening. He is having flatus but has not had  BM in about 3 days. His wife and sister are at the bedside. Patient walked in the hall yesterday.  Patients main questions are: who is allowed to access my port? Should I be on blood pressure medication?  We also discussed PO meds, pain control, and constipation.    Objective: Vital signs in last 24 hours: Temp:  [97.7 F (36.5 C)-98.3 F (36.8 C)] 98.2 F (36.8 C) (08/02 0436) Pulse Rate:  [102-125] 102 (08/02 0436) Resp:  [18] 18 (08/02 0436) BP: (134-157)/(92-108) 149/92 (08/02 0436) SpO2:  [95 %-100 %] 96 % (08/02 0436) Weight:  [76.8 kg] 76.8 kg (08/02 0500) Last BM Date : 10/22/22  Intake/Output from previous day: 08/01 0701 - 08/02 0700 In: 120 [P.O.:120] Out: 400 [Urine:400] Intake/Output this shift: No intake/output data recorded.  General appearance: alert and cooperative Resp: normal effort on room air; port left chest wall GI: soft, nontender; mild distention, no rebound tenderness, no guarding, honeycomb removed, incision c.d.I. J tube is clamped.  Lab Results:  Recent Labs    10/23/22 0558  WBC 10.5  HGB 12.8*  HCT 39.3  PLT 153   BMET Recent Labs    10/24/22 0924 10/25/22 0500  NA 146* 144  K 3.4* 3.0*  CL 108 108  CO2 27 29  GLUCOSE 113* 158*  BUN 32* 30*  CREATININE 0.79 0.75  CALCIUM 8.7* 7.9*   PT/INR No results for input(s): "LABPROT", "INR" in the last 72 hours. ABG No results for input(s): "PHART", "HCO3" in the last 72 hours.  Invalid input(s): "PCO2", "PO2"  Studies/Results: CT ABDOMEN PELVIS W CONTRAST  Result Date: 10/23/2022 CLINICAL DATA:  Bowel obstruction suspected (Ped 5-17y)  concern for pnuemotosis EXAM: CT ABDOMEN AND PELVIS WITH CONTRAST TECHNIQUE: Multidetector CT imaging of the abdomen and pelvis was performed using the standard protocol following bolus administration of intravenous contrast. RADIATION DOSE REDUCTION: This exam was performed according to the departmental dose-optimization program which includes automated exposure control, adjustment of the mA and/or kV according to patient size and/or use of iterative reconstruction technique. CONTRAST:  OMNIPAQUE IOHEXOL 300 MG/ML  SOLN COMPARISON:  10/11/2022 FINDINGS: Lower chest: No pleural or pericardial effusion. Catheter extends to the proximal right atrium. Patchy atelectasis or infiltrates posteriorly in both lung bases, new since previous. Hepatobiliary: Stable probable cyst in hepatic segment 2. No new liver lesion or biliary ductal dilatation. Gallbladder is distended, without definite wall thickening. Pancreas: Unremarkable. No pancreatic ductal dilatation or surrounding inflammatory changes. Spleen: Normal in size without focal abnormality. Adrenals/Urinary Tract: No adrenal mass. Symmetric renal enhancement without hydronephrosis or worrisome lesion. Urinary bladder physiologically distended. Stomach/Bowel: Stomach is distended by gas and fluid. Despite that, there is wall thickening in the body and antrum as seen on prior study. The duodenum is distended. Multiple gas dilated loops of small bowel. Interval placement of jejunostomy catheter into the proximal jejunum. There is pneumatosis of proximal jejunum beginning just distal to the jejunostomy catheter and involving several loops. There is gas and fluid distending mid and distal small bowel, without other pneumatosis or wall thickening. Terminal ileum is nondilated. Ascending colon is partially  distended, distal segments decompressed. Scattered distal descending and sigmoid diverticula are noted. Vascular/Lymphatic: Minimal plaque in the abdominal aorta  without dissection, aneurysm, or stenosis. Portal vein patent. Gastrohepatic ligament adenopathy as previously noted. No other mesenteric, retroperitoneal, or pelvic adenopathy. Reproductive: Prostate enlargement Other: There is a small amount of free intraperitoneal intraperitoneal gas, and scattered abdominal and pelvic ascites, new since previous. Musculoskeletal: No acute or significant osseous findings. IMPRESSION: 1. Interval placement of jejunostomy catheter into the proximal jejunum. 2. Pneumatosis of proximal jejunum beginning just distal to the jejunostomy catheter, extending into the mesentery. Mid and distal small bowel loops are distended without wall thickening or pneumatosis. 3. Small amount of free intraperitoneal gas and scattered abdominal and pelvic ascites, new since previous, potentially related to recent surgery. Critical Value/emergent results were called by telephone at the time of interpretation on 10/23/2022 at 10:36 pm to provider Jeanie Sewer, who verbally acknowledged these results. 4. Persistent gastric wall thickening in the body and antrum with Gastrohepatic ligament adenopathy as previously noted, consistent with history of gastric carcinoma. 5. Patchy atelectasis or infiltrates posteriorly in both lung bases, new since previous. 6. Prostate enlargement. 7.  Aortic Atherosclerosis (ICD10-I70.0). Electronically Signed   By: Corlis Leak M.D.   On: 10/23/2022 22:38   DG Abd 1 View  Result Date: 10/23/2022 CLINICAL DATA:  Nausea vomiting EXAM: ABDOMEN - 1 VIEW COMPARISON:  10/19/2022, CT 10/11/2022 FINDINGS: Persistent multiple loops of dilated small bowel, measuring up to 5.1 cm. Abnormal appearing loop of small bowel in the left lower quadrant with an appearance suggesting intramural air. Probable left lower quadrant jejunostomy tube with tip in the left mid abdomen. IMPRESSION: Persistent multiple loops of dilated small bowel suspicious for small bowel obstruction. Abnormal appearing  loop of small bowel in the left lower quadrant with an appearance suggesting intramural air. CT is recommended for further evaluation. Critical Value/emergent results were called by telephone at the time of interpretation on 10/23/2022 at 5:40 pm to provider Dr. Caleb Popp, Who verbally acknowledged these results. Electronically Signed   By: Jasmine Pang M.D.   On: 10/23/2022 17:41    Anti-infectives: Anti-infectives (From admission, onward)    Start     Dose/Rate Route Frequency Ordered Stop   10/24/22 0900  ceFEPIme (MAXIPIME) 2 g in sodium chloride 0.9 % 100 mL IVPB        2 g 200 mL/hr over 30 Minutes Intravenous Every 12 hours 10/24/22 0805     10/24/22 0900  metroNIDAZOLE (FLAGYL) IVPB 500 mg        500 mg 100 mL/hr over 60 Minutes Intravenous Every 12 hours 10/24/22 0805     10/16/22 0730  cefoTEtan (CEFOTAN) 2 g in sodium chloride 0.9 % 100 mL IVPB        2 g 200 mL/hr over 30 Minutes Intravenous On call to O.R. 10/15/22 1051 10/16/22 1845       Assessment/Plan: s/p Procedure(s): DIAGNOSTIC LAPAROSCOPY, OPEN J TUBE PLACEMENT (N/A) INSERTION PORT-A-CATH (N/A) Metastatic gastric cancer with outlet obstruction  s/p above J tube placement 7/24 Dr. Donell Beers -  CT scan 7/31 showed pneumatosis in the area around the J-tube. Low suspicion for bowel ischemia at this time. Continue to hold tube feeds and continue antibiotics (cefepime/flagyl given PCN allergy). Monitor abdominal exam. Check CBC in the AM.  - continue TPN ordered. Ice chips/sips for comfort is ok - patient continues to have sxs related to GOO. Recommend discontinuation of ALL PO meds. He is likely not absorbing these.  Maximize IV meds until it is safe to use J tube for liquid medications.will discuss this with both palliative and TRH, as he is on PO pain meds, avapro, and prozac. - he also has small bowel dilation, likely reactive ileus but cannot exclude pSBO. If he develops worsening distention or nausea/vomiting. Then placement  of NG tube for both GOO and ileus is reasonable. He is not a candidate for a venting G tube due to tumor burden of his stomach. - defer mgmt HTN to [facility]  - appreciate oncology and palliative following.  - ordered suppository to encourage bowel function   We will follow closely with you   LOS: 10 days    Adam Phenix 10/25/2022

## 2022-10-25 NOTE — Progress Notes (Signed)
Daily Progress Note   Patient Name: Steven Wolf       Date: 10/25/2022 DOB: [birth year]  Age: 67 y.o. MRN#: [id number] Attending Physician: Narda Bonds, MD Primary Care Physician: Pincus Sanes, MD Admit Date: 10/14/2022 Length of Stay: 10 days  Reason for Consultation/Follow-up: Establishing goals of care and management  Subjective:   CC: Patient continues to receive TPN with tube feeds on hold. Following up regarding complex medical decision making and symptom management.   Subjective:  Reviewed EMR prior to presenting to bedside.  Also discussed care with hospitalist and surgery team PA for coordination of care.  Oral medications being discontinued at this time to allow bowel rest in setting of tube feeds being held.  Presented to bedside to meet with patient.  Patient laying comfortably in the bed.  Patient's wife present at bedside.  Able to introduce myself as a member of the palliative medicine team.  Discussed patient's symptom burden at this time.  With discontinuing oral medications, noted transition over to IV Dilaudid as needed for pain management.  Patient and wife agreeing with this plan. Also discussed need for bowel movement today.  Patient feels that enema was more beneficial.  Noted would order soapsuds enema for today instead of suppository.  Had cleared this with surgery team.  Wife inquiring about how she is going to maintain assist him at home.  Discussed currently patient needs to be able to tolerate form of nutrition before can consider getting patient home.  Will need to continue discussions regarding this based on patient's daily evaluations. Wife also inquiring about patient getting chemo.  Noted would defer plan to patient's oncologist, Dr. Mosetta Putt.  Did explain that while patient does not have good source of nutrition, would not be appropriate to start chemotherapy as it may cost more risk than benefit in his weakened state.  Patient's wife acknowledges. Spent  time providing emotional support via active listening.    All questions answered at that time.  Noted palliative medicine team will continue to follow with patient's journey.  Thank patient and his wife for allowing me to visit with him today.  Discussed care with IDT during day.  Review of Systems Adjusting pain medications Objective:   Vital Signs:  BP (!) 149/92 (BP Location: Left Arm)   Pulse (!) 102   Temp 98.2 F (36.8 C) (Oral)   Resp 18   Ht 6' (1.829 m)   Wt 76.8 kg   SpO2 96%   BMI 22.96 kg/m   Physical Exam: General: NAD, alert, laying in bed, pleasant , chronically ill appearing Eyes: No drainage noted HENT: moist mucous membranes Cardiovascular: RRR Respiratory: no increased work of breathing noted, not in respiratory distress Abdomen: distended Neuro: A&Ox4, following commands easily Psych: appropriately answers all questions  Imaging:  I personally reviewed recent imaging.   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 Since admission, patient has received continued workup through oncology, GI, and surgery. Patient underwent J-tube placement for management of tube feeds in setting of gastric obstruction. Plan is for patient to start palliative immunotherapy after hospitalization. Palliative medicine team consulted to assist with complex medical decision making  and symptom management.   Recommendations/Plan: # Complex medical decision making/goals of care:      -Continues open discussions with his care team regarding pathways for medical care moving forward.  Currently plan is for patient to  receive palliative immunotherapy in the outpatient setting.                -  Code Status: Full Code                                -Dr. Mosetta Putt already discussed CODE STATUS with patient on 7/29 who noted continuation of full code at this time though very open to further discussions about changing CODE STATUS to DNR should cancer directed therapies not go as hoped.   # Symptom management                -Pain, abdominal in the setting of metastatic gastric cancer with peritoneal mets and gastric outlet obstruction                               -Discontinue oxycodone since transitioning to IV meds                               -Change IV Dilaudid to 0.5-1 mg every 2 hours as needed for pain                  -Nausea/vomiting, in setting of metastatic gastric cancer with peritoneal mets and gastric outlet obstruction                               -Continue Zofran 4 mg every 6 hours as needed                               -Continue Compazine 5 mg every 6 hours as needed                               -EKG on 7/30 showed QTc of 452.                   -Constipation Patient noted BM on 7/30.                                -Discontinue senna    -Order soap suds enema for today. Patient responded better to enema over suppository previously.                                -Encourage ambulation as able   # Psycho-social/Spiritual Support:  - Support System: significant other listed in EMR- [facility]   # Discharge Planning:  To Be Determined                -If continues current medical pathway, will need outpatient palliative medicine follow referral either at [facility] or home.   Discussed with: patient, patient's wife, RN  Thank you for allowing the palliative care team to participate in the care MORDECAI TINDOL.  Alvester Morin, DO Palliative Care Provider PMT # [phone]  If patient remains symptomatic despite maximum doses, please call PMT at [phone] between 0700 and 1900. Outside of these hours,  please call attending, as PMT does not have night coverage.  *Please note that this is a verbal dictation therefore any spelling or grammatical errors are due to the "[employer] One" system interpretation.

## 2022-10-25 NOTE — Care Management Important Message (Signed)
Important Message  Patient Details IM Letter given. Name: Steven Wolf MRN: [id number] Date of Birth: Dec 04, 1955   Medicare Important Message Given:  Yes     Caren Macadam 10/25/2022, 2:59 PM

## 2022-10-26 DIAGNOSIS — Z515 Encounter for palliative care: Secondary | ICD-10-CM | POA: Diagnosis not present

## 2022-10-26 DIAGNOSIS — F419 Anxiety disorder, unspecified: Secondary | ICD-10-CM | POA: Diagnosis not present

## 2022-10-26 DIAGNOSIS — K311 Adult hypertrophic pyloric stenosis: Secondary | ICD-10-CM | POA: Diagnosis not present

## 2022-10-26 DIAGNOSIS — C169 Malignant neoplasm of stomach, unspecified: Secondary | ICD-10-CM | POA: Diagnosis not present

## 2022-10-26 DIAGNOSIS — G893 Neoplasm related pain (acute) (chronic): Secondary | ICD-10-CM | POA: Diagnosis not present

## 2022-10-26 LAB — GLUCOSE, CAPILLARY
Glucose-Capillary: 147 mg/dL — ABNORMAL HIGH (ref 70–99)
Glucose-Capillary: 135 mg/dL — ABNORMAL HIGH (ref 70–99)
Glucose-Capillary: 127 mg/dL — ABNORMAL HIGH (ref 70–99)
Glucose-Capillary: 156 mg/dL — ABNORMAL HIGH (ref 70–99)

## 2022-10-26 MED ORDER — HYDRALAZINE HCL 20 MG/ML IJ SOLN: 10.0000 mg | Freq: Four times a day (QID) | INTRAMUSCULAR | Status: DC | PRN

## 2022-10-26 MED ORDER — TRAVASOL 10 % IV SOLN
INTRAVENOUS | Status: AC
  Filled 2022-10-26: qty 1101.6

## 2022-10-26 MED ORDER — LORAZEPAM 2 MG/ML IJ SOLN: 0.5000 mg | INTRAMUSCULAR | Status: DC | PRN

## 2022-10-26 MED ORDER — POTASSIUM CHLORIDE 10 MEQ/100ML IV SOLN
10.0000 meq | INTRAVENOUS | Status: AC
  Administered 2022-10-26 (×4): 10 meq via INTRAVENOUS
  Filled 2022-10-26 (×4): qty 100

## 2022-10-26 NOTE — Progress Notes (Signed)
PHARMACY - TOTAL PARENTERAL NUTRITION CONSULT NOTE   Indication:  Gastric Outlet Obstruction  Patient Measurements: Height: 6' (182.9 cm) Weight: 76.8 kg (169 lb 5 oz) IBW/kg (Calculated) : 77.6 TPN AdjBW (KG): 72.3 Body mass index is 22.96 kg/m. Usual Weight: 72kg  Assessment:  67 YO male with metastatic gastric cancer and outlet obstruction. Patient had J-tube placement 7/24 due to poor oral intake secondary to gastric outlet obstruction. He has been unable to advance tube feeds since J-tube placement. KUB from 7/31 showed air-fluid levels of the small bowel and CT scan showed pneumatosis in the area around the J-tube. TPN to start in the setting of gastric outlet obstruction and inability to advance enteral feeding.  Glucose / Insulin: No DM hx CBGs at goal < 150 last 24hr (no insulin used) Electrolytes:  K low at 3.4 but improved Phos now WNL after repletion 8/2 and increase in TPN Renal:  Scr <1, stable Hepatic:  LFTs wnl, stable Albumin low at 1.8 Initial TGs 150--stable Intake / Output; MIVF:  UOP: -1000 mL/24 hrs Intake: not documented GI Imaging: - 7/19 a/p: Gastric wall thickening, consistent with known gastric cancer. Perigastric fat stranding, concerning for local invasion. New prominent subcentimeter gastrohepatic ligament and left para-aortic lymph nodes, concerning for nodal metastatic disease. - 7/31 a/p: Interval placement of jejunostomy catheter into the proximal jejunum. Pneumatosis of proximal jejunum beginning just distal to the jejunostomy catheter, extending into the mesentery. Mid and distal small bowel loops are distended without wall thickening or pneumatosis. Small amount of free intraperitoneal gas and scattered abdominal and pelvic ascites, new since previous, potentially related to recent surgery. Persistent gastric wall thickening in the body and antrum with gastrohepatic ligament adenopathy as previously noted, consistent with history of gastric  carcinoma. Patchy atelectasis or infiltrates posteriorly in both lung bases, new since previous. GI Surgeries / Procedures: - 7/24: diagnostic laparoscopy, open J tube placement, port-a-cath insertion, upper EUS  Central access: port-a-cath placed 7/24 TPN start date: 8/1  Nutritional Goals: Goal TPN rate is 90 mL/hr (provides 110 g of protein and 2219 kcals per day)  RD Assessment: Estimated Needs Total Energy Estimated Needs: 2150-2350 Total Protein Estimated Needs: 105-120g Total Fluid Estimated Needs: 2.1L/day RD also recommended thiamine 100mg  IV daily x5 days due to risk for refeeding syndrome   Current Nutrition:  Clear liquids and TPN  Plan:  Now: - Give IV KCl x4  At 1800: - Increase TPN rate to goal rate of 90 mL/hr at 1800 - Electrolytes in TPN:  Na 14mEq/L K 6mEq/L Ca 7mEq/L Mg 41mEq/L Phos 16mmol/L Cl:Ac 1:1 - Add standard MVI and trace elements to TPN - Continue Sensitive q4h SSI and adjust as needed  - Monitor TPN labs daily while escalating to goal rate and on Mon/Thurs and as needed - Continue Thiamine 100mg  IV daily x5 days   Hessie Knows, PharmD, BCPS Secure Chat if ?s 10/26/2022 7:45 AM

## 2022-10-26 NOTE — Plan of Care (Signed)

## 2022-10-26 NOTE — Progress Notes (Signed)
Daily Progress Note   Patient Name: Steven Wolf       Date: 10/26/2022 DOB: September 17, 1955  Age: 67 y.o. MRN#: [id number] Attending Physician: Narda Bonds, MD Primary Care Physician: Pincus Sanes, MD Admit Date: 10/14/2022 Length of Stay: 11 days  Reason for Consultation/Follow-up: Establishing goals of care and symptom management  Subjective:   CC: Patient continues to receive TPN with tube feeds on hold. Following up regarding complex medical decision making and symptom management.   Subjective:  Reviewed EMR prior to presenting to bedside.  At time of EMR review patient had received IV Dilaudid 1 mg x 1 dose in past 24 hours.  Patient has also received Zofran 4 mg and Compazine 5 mg each x 1 dose in past 24 hours.  Patient seen by oncologist yesterday who discussed patient support in the outpatient setting to follow-up for potential immunotherapy.  Surgery continues to follow along with the patient to provide recommendations regarding J-tube management.  To start tube feeds on Monday.  Presented to bedside to check on patient's symptoms this morning.  Patient's wife present at bedside and her sister was able to join as well.  Able to review patient's symptom burden at this time.  Patient notes at times feeling lethargic.  Discussed based on patient's use of medications, could potentially be antiemetics causing sedation since he is getting those much more frequently than pain medications.  In fact based on patient's pain medicine dosing and frequency, would lean more towards antiemetic causing this feeling better than his pain medicine.  Patient going to monitor during today to determine if any effects from Zofran or Compazine.  Also discussed range available on as needed pain medicine which can be further adjusted if needed. Patient notes he did have a bowel movement yesterday without the enema so noted could receive an enema if needed in the future. Also discussed trying not to take any  medications by mouth as per surgery recommendations.  Discussed patient's Prozac and patient agreeing with holding this medication at this time since is a by mouth medication.  Discussed long half-life of Prozac so hopefully will not have any withdrawals associated with this as long as can restart next week when restarting J-tube feeds.  Discussed we will have as needed Ativan if needed for breakthrough anxiety as patient is worried about having a panic attack.  All questions answered at that time.  Noted palliative medicine team will continue to follow with patient's medical journey.  Review of Systems Adjusting pain medications Objective:   Vital Signs:  BP 136/80 (BP Location: Left Arm)   Pulse (!) 103   Temp 97.8 F (36.6 C) (Oral)   Resp 18   Ht 6' (1.829 m)   Wt 76.8 kg   SpO2 97%   BMI 22.96 kg/m   Physical Exam: General: NAD, alert, laying in bed, pleasant , chronically ill appearing Eyes: No drainage noted HENT: moist mucous membranes Cardiovascular: RRR Respiratory: no increased work of breathing noted, not in respiratory distress Abdomen: distended Neuro: A&Ox4, following commands easily Psych: appropriately answers all questions  Imaging:  I personally reviewed recent imaging.   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 Since admission, patient has received continued workup through oncology, GI, and surgery. Patient underwent J-tube placement for management of tube feeds in setting of gastric obstruction. Plan is for patient to start palliative immunotherapy after hospitalization. Palliative  medicine team consulted to assist with complex medical decision making and symptom management.   Recommendations/Plan: # Complex medical decision making/goals of care:      -Continues open discussions with his care team regarding pathways for medical care moving forward.  Currently plan is for patient to receive palliative immunotherapy in the outpatient setting.                -  Code Status: Full Code                                -Dr. Mosetta Putt already discussed CODE STATUS with patient on 7/29 who noted continuation of full code at this time though very open to further discussions about changing CODE STATUS to DNR should cancer directed therapies not go as hoped.   # Symptom management                -Pain, abdominal in the setting of metastatic gastric cancer with peritoneal mets and gastric outlet obstruction                               -Discontinue oxycodone since transitioning to IV meds                               -Continue IV Dilaudid to 0.5-1 mg every 2 hours as needed for pain                  -Nausea/vomiting, in setting of metastatic gastric cancer with peritoneal mets and gastric outlet obstruction                               -Continue Zofran 4 mg every 6 hours as needed                               -Continue Compazine 5 mg every 6 hours as needed                               -EKG on 7/30 showed QTc of 452.                   -Constipation Patient noted BM on 7/30.                                -Discontinue senna    -Order soap suds enema for today. Patient responded better to enema over suppository previously.                                -Encourage ambulation as able    -Anxiety   -Discontinuing Prozac 20mg  po daily at this time since oral medications on hold. Consider restarting via tube once J tube cleared for use.    -Start IV Ativan 0.5mg  q4hrs prn   #  Psycho-social/Spiritual Support:  - Support System: wife, sister-in-law   # Discharge Planning:  To Be  Determined                -If continues current medical pathway, will need outpatient palliative medicine follow referral either at [facility] or home.   Discussed with: patient, patient's wife, RN  Thank you for allowing the palliative care team to participate in the care Steven Wolf.  Alvester Morin, DO Palliative Care Provider PMT # [phone]  If patient remains symptomatic despite maximum doses, please call PMT at [phone] between 0700 and 1900. Outside of these hours, please call attending, as PMT does not have night coverage.  *Please note that this is a verbal dictation therefore any spelling or grammatical errors are due to the "[employer]" system interpretation.

## 2022-10-26 NOTE — Progress Notes (Signed)
Held PO morning med (prozac) per DR Cornett until further notified.

## 2022-10-26 NOTE — Progress Notes (Addendum)
PROGRESS NOTE    Steven Wolf  ZOX:[id number] DOB: 1956/03/06 DOA: 10/14/2022 PCP: Pincus Sanes, MD   Brief Narrative: Steven Wolf is a 67 y.o. male with a history of gastric adenocarcinoma, hypertension, depression.  Patient presented secondary to need for J-tube placement for poor oral intake secondary to gastric outlet obstruction.  Post G-tube placement, patient with inability to advance tube feeds.  General surgery consulted for concern of ileus versus bowel obstruction. Patient continued to fail advancement of tube feed rate. Repeat imaging was concerning for development of pneumatosis. Tube feeds held and TPN started on 8/1.   Assessment and Plan:  Gastric outlet obstruction Secondary to recently diagnosed gastric adenocarcinoma.  Patient was sent to the hospital for placement of a J-tube which was performed on 7/24.  Since placement, patient has not been tolerating advancement of tube feeds secondary to recurrent nausea and vomiting.  Nausea/vomiting Possible ileus versus obstruction Pneumotosis Related to tube feeds.  X-ray imaging was concerning for ileus versus obstruction.  General surgery was consulted and recommended conservative management with treatment for constipation.  Patient with multiple attempts to readvance tube feeds without success and resultant recurrent nausea and vomiting.  KUB concerning for pneumatosis which was confirmed on CT scan. Per general surgery, likely related to J-tube placement -General surgery recommendations: NPO with ice chips, stop PO meds, stop tube feeds, TPN, Cefepime/Flagyl  Gastric adenocarcinoma Patient follows with Dr. Mosetta Putt as an outpatient with plan to start chemotherapy.  Primary hypertension Irbesartan discontinued secondary to NPO status and inability to use J-tube. -Hydralazine IV PRN with parameters  GERD -Continue Protonix  Depression Prozac held secondary to NPO status  Hypokalemia Potassium supplementation  as needed  Severe malnutrition Dietitian recommendations (7/31): Monitor magnesium, potassium, and phosphorus for at least 3 days, MD to replete as needed, as pt is at risk for refeeding syndrome. Continue Osmolite 1.5 @ 20 ml/hr via J-tube. Recommend advance by 10 ml every 24 hours to goal rate of 65 ml/hr. Provides at goal: 2340 kcals, 97g protein and 1188 ml H2O.   DVT prophylaxis: Lovenox Code Status:   Code Status: Full Code Family Communication: Wife and sister at bedside Disposition Plan: Discharge pending ability for patient to tolerate tube feeds and continued general surgery recommendations   Consultants:  General surgery [place] [facility] Medical oncology Palliative care medicine  Procedures:  7/24: EUS  Antimicrobials: Cefepime Flagyl    Subjective: No emesis overnight. Passing gas.  Objective: BP 136/80 (BP Location: Left Arm)   Pulse (!) 103   Temp 97.8 F (36.6 C) (Oral)   Resp 18   Ht 6' (1.829 m)   Wt 76.8 kg   SpO2 97%   BMI 22.96 kg/m   Examination:  General exam: Appears calm and comfortable Respiratory system: Clear to auscultation. Respiratory effort normal. Cardiovascular system: S1 & S2 heard, RRR. Gastrointestinal system: Abdomen is mildly distended, soft and nontender. Normal bowel sounds heard. Central nervous system: Alert and oriented. No focal neurological deficits. Musculoskeletal: No edema. No calf tenderness   Data Reviewed: I have personally reviewed following labs and imaging studies  CBC Lab Results  Component Value Date   WBC 8.6 10/26/2022   RBC 3.75 (L) 10/26/2022   HGB 11.2 (L) 10/26/2022   HCT 35.1 (L) 10/26/2022   MCV 93.6 10/26/2022   MCH 29.9 10/26/2022   PLT 127 (L) 10/26/2022   MCHC 31.9 10/26/2022   RDW 13.4 10/26/2022   LYMPHSABS 1.1 10/14/2022   MONOABS 0.6  10/14/2022   EOSABS 0.1 10/14/2022   BASOSABS 0.1 10/14/2022     Last metabolic panel Lab Results  Component Value Date   NA 142  10/26/2022   K 3.4 (L) 10/26/2022   CL 110 10/26/2022   CO2 27 10/26/2022   BUN 26 (H) 10/26/2022   CREATININE 0.68 10/26/2022   GLUCOSE 166 (H) 10/26/2022   GFRNONAA >60 10/26/2022   GFRAA 73 12/13/2019   CALCIUM 7.9 (L) 10/26/2022   PHOS 3.3 10/26/2022   PROT 4.3 (L) 10/26/2022   ALBUMIN 1.8 (L) 10/26/2022   BILITOT 1.0 10/26/2022   ALKPHOS 76 10/26/2022   AST 19 10/26/2022   ALT 23 10/26/2022   ANIONGAP 5 10/26/2022    GFR: Estimated Creatinine Clearance: 94.7 mL/min (by C-G formula based on SCr of 0.68 mg/dL).  No results found for this or any previous visit (from the past 240 hour(s)).    Radiology Studies: No results found.    LOS: 11 days    Jacquelin Hawking, MD [facility] 10/26/2022, 1:17 PM   If 7PM-7AM, please contact night-coverage [email or web address]

## 2022-10-26 NOTE — Progress Notes (Signed)
10 Days Post-Op   Subjective/Chief Complaint: Had a BM denies pain nausea    Objective: Vital signs in last 24 hours: Temp:  [97.8 F (36.6 C)-97.9 F (36.6 C)] 97.8 F (36.6 C) (08/02 1957) Pulse Rate:  [96-103] 103 (08/02 1957) Resp:  [18] 18 (08/02 1957) BP: (136-155)/(80-101) 136/80 (08/02 1957) SpO2:  [96 %-97 %] 97 % (08/02 1957) Last BM Date : 10/22/22  Intake/Output from previous day: 08/02 0701 - 08/03 0700 In: -  Out: 1000 [Urine:1000] Intake/Output this shift: No intake/output data recorded.   General appearance: alert and cooperative Resp: normal effort on room air; port left chest wall GI: soft, nontender; mild distention, no rebound tenderness, no guarding, honeycomb removed, incision c.d.I. J tube is clamped. Lab Results:  Recent Labs    10/26/22 0553  WBC 8.6  HGB 11.2*  HCT 35.1*  PLT 127*   BMET Recent Labs    10/25/22 0500 10/26/22 0553  NA 144 142  K 3.0* 3.4*  CL 108 110  CO2 29 27  GLUCOSE 158* 166*  BUN 30* 26*  CREATININE 0.75 0.68  CALCIUM 7.9* 7.9*   PT/INR No results for input(s): "LABPROT", "INR" in the last 72 hours. ABG No results for input(s): "PHART", "HCO3" in the last 72 hours.  Invalid input(s): "PCO2", "PO2"  Studies/Results: No results found.  Anti-infectives: Anti-infectives (From admission, onward)    Start     Dose/Rate Route Frequency Ordered Stop   10/24/22 0900  ceFEPIme (MAXIPIME) 2 g in sodium chloride 0.9 % 100 mL IVPB        2 g 200 mL/hr over 30 Minutes Intravenous Every 12 hours 10/24/22 0805     10/24/22 0900  metroNIDAZOLE (FLAGYL) IVPB 500 mg        500 mg 100 mL/hr over 60 Minutes Intravenous Every 12 hours 10/24/22 0805     10/16/22 0730  cefoTEtan (CEFOTAN) 2 g in sodium chloride 0.9 % 100 mL IVPB        2 g 200 mL/hr over 30 Minutes Intravenous On call to O.R. 10/15/22 1051 10/16/22 1845       Assessment/Plan: Metastatic gastric cancer with outlet obstruction  s/p above J tube  placement 7/24 Dr. Donell Beers -  CT scan 7/31 showed pneumatosis in the area around the J-tube. Low suspicion for bowel ischemia at this time. Continue to hold tube feeds and continue antibiotics (cefepime/flagyl given PCN allergy). Monitor abdominal exam. Check CBC in the AM.  - continue TPN ordered. Ice chips/sips for comfort is ok - patient continues to have sxs related to GOO. Recommend discontinuation of ALL PO meds. He is likely not absorbing these. Maximize IV meds until it is safe to use J tube for liquid medications.will discuss this with both palliative and TRH, as he is on PO pain meds, avapro, and prozac. - he also has small bowel dilation, likely reactive ileus but cannot exclude pSBO. If he develops worsening distention or nausea/vomiting. Then placement of NG tube for both GOO and ileus is reasonable. He is not a candidate for a venting G tube due to tumor burden of his stomach. - defer mgmt HTN to [facility]  - appreciate oncology and palliative following.  - ordered suppository to encourage bowel function -ICE CHIPS OK HAD A BM POSSIBLE TO RESTART TF MONDAY   LOS: 11 days    Dortha Schwalbe MD  10/26/2022

## 2022-10-27 DIAGNOSIS — K311 Adult hypertrophic pyloric stenosis: Secondary | ICD-10-CM | POA: Diagnosis not present

## 2022-10-27 DIAGNOSIS — Z515 Encounter for palliative care: Secondary | ICD-10-CM | POA: Diagnosis not present

## 2022-10-27 DIAGNOSIS — G893 Neoplasm related pain (acute) (chronic): Secondary | ICD-10-CM | POA: Diagnosis not present

## 2022-10-27 DIAGNOSIS — C169 Malignant neoplasm of stomach, unspecified: Secondary | ICD-10-CM | POA: Diagnosis not present

## 2022-10-27 LAB — COMPREHENSIVE METABOLIC PANEL WITH GFR
ALT: 28 U/L (ref 0–44)
AST: 27 U/L (ref 15–41)
Albumin: 1.8 g/dL — ABNORMAL LOW (ref 3.5–5.0)
Alkaline Phosphatase: 99 U/L (ref 38–126)
Anion gap: 7 (ref 5–15)
BUN: 26 mg/dL — ABNORMAL HIGH (ref 8–23)
CO2: 25 mmol/L (ref 22–32)
Calcium: 7.9 mg/dL — ABNORMAL LOW (ref 8.9–10.3)
Chloride: 109 mmol/L (ref 98–111)
Creatinine, Ser: 0.72 mg/dL (ref 0.61–1.24)
GFR, Estimated: >60 mL/min (ref >60)
Glucose, Bld: 142 mg/dL — ABNORMAL HIGH (ref 70–99)
Potassium: 4.4 mmol/L (ref 3.5–5.1)
Sodium: 141 mmol/L (ref 135–145)
Total Bilirubin: 1 mg/dL (ref 0.3–1.2)
Total Protein: 4.5 g/dL — ABNORMAL LOW (ref 6.5–8.1)

## 2022-10-27 LAB — MAGNESIUM: Magnesium: 2.2 mg/dL (ref 1.7–2.4)

## 2022-10-27 LAB — GLUCOSE, CAPILLARY
Glucose-Capillary: 135 mg/dL — ABNORMAL HIGH (ref 70–99)
Glucose-Capillary: 142 mg/dL — ABNORMAL HIGH (ref 70–99)
Glucose-Capillary: 146 mg/dL — ABNORMAL HIGH (ref 70–99)

## 2022-10-27 LAB — PHOSPHORUS: Phosphorus: 2.9 mg/dL (ref 2.5–4.6)

## 2022-10-27 MED ORDER — SODIUM CHLORIDE 0.9% FLUSH: 10.0000 mL | INTRAVENOUS | Status: DC | PRN

## 2022-10-27 MED ORDER — TRAVASOL 10 % IV SOLN
INTRAVENOUS | Status: AC
  Filled 2022-10-27: qty 1101.6

## 2022-10-27 MED ORDER — INSULIN ASPART 100 UNIT/ML IJ SOLN
0.0000 [IU] | Freq: Four times a day (QID) | INTRAMUSCULAR | Status: DC
  Administered 2022-10-27 – 2022-10-28 (×4): 1 [IU] via SUBCUTANEOUS

## 2022-10-27 NOTE — Plan of Care (Signed)

## 2022-10-27 NOTE — Progress Notes (Signed)
PROGRESS NOTE    Steven Wolf  ZOX:[id number] DOB: June 06, 1955 DOA: 10/14/2022 PCP: Pincus Sanes, MD   Brief Narrative: Steven Wolf is a 67 y.o. male with a history of gastric adenocarcinoma, hypertension, depression.  Patient presented secondary to need for J-tube placement for poor oral intake secondary to gastric outlet obstruction.  Post G-tube placement, patient with inability to advance tube feeds.  General surgery consulted for concern of ileus versus bowel obstruction. Patient continued to fail advancement of tube feed rate. Repeat imaging was concerning for development of pneumatosis. Tube feeds held and TPN started on 8/1.   Assessment and Plan:  Gastric outlet obstruction Secondary to recently diagnosed gastric adenocarcinoma.  Patient was sent to the hospital for placement of a J-tube which was performed on 7/24.  Since placement, patient has not been tolerating advancement of tube feeds secondary to recurrent nausea and vomiting.  Nausea/vomiting Possible ileus versus obstruction Pneumotosis Related to tube feeds.  X-ray imaging was concerning for ileus versus obstruction.  General surgery was consulted and recommended conservative management with treatment for constipation.  Patient with multiple attempts to readvance tube feeds without success and resultant recurrent nausea and vomiting.  KUB concerning for pneumatosis which was confirmed on CT scan. Per general surgery, likely related to J-tube placement -General surgery recommendations: NPO with ice chips, stop PO meds, stop tube feeds, TPN, Cefepime/Flagyl  Gastric adenocarcinoma Patient follows with Dr. Mosetta Putt as an outpatient with plan to start chemotherapy.  Primary hypertension Irbesartan discontinued secondary to NPO status and inability to use J-tube. -Hydralazine IV PRN with parameters  GERD -Continue Protonix  Depression Prozac held secondary to NPO status  Hypokalemia Potassium supplementation  as needed  Severe malnutrition Dietitian recommendations (7/31): Monitor magnesium, potassium, and phosphorus for at least 3 days, MD to replete as needed, as pt is at risk for refeeding syndrome. Continue Osmolite 1.5 @ 20 ml/hr via J-tube. Recommend advance by 10 ml every 24 hours to goal rate of 65 ml/hr. Provides at goal: 2340 kcals, 97g protein and 1188 ml H2O.   DVT prophylaxis: Lovenox Code Status:   Code Status: Full Code Family Communication: None at bedside Disposition Plan: Discharge pending ability for patient to tolerate tube feeds and continued general surgery recommendations   Consultants:  General surgery [place] [facility] Medical oncology Palliative care medicine  Procedures:  7/24: EUS  Antimicrobials: Cefepime Flagyl    Subjective: Patient reports no nausea or emesis.  He had multiple bowel movements yesterday.  No abdominal pain.  Objective: BP (!) 144/93 (BP Location: Left Arm)   Pulse 88   Temp 98.5 F (36.9 C) (Oral)   Resp 18   Ht 6' (1.829 m)   Wt 82 kg   SpO2 98%   BMI 24.52 kg/m   Examination:  General exam: Appears calm and comfortable Respiratory system: Clear to auscultation. Respiratory effort normal. Cardiovascular system: S1 & S2 heard, Normal rate with regular rhythm. Gastrointestinal system: Abdomen is slightly distended, soft and nontender. Normal bowel sounds heard. Central nervous system: Alert and oriented. No focal neurological deficits. Musculoskeletal: No edema. No calf tenderness Skin: No cyanosis. No rashes Psychiatry: Judgement and insight appear normal. Mood & affect appropriate.    Data Reviewed: I have personally reviewed following labs and imaging studies  CBC Lab Results  Component Value Date   WBC 8.6 10/26/2022   RBC 3.75 (L) 10/26/2022   HGB 11.2 (L) 10/26/2022   HCT 35.1 (L) 10/26/2022   MCV 93.6 10/26/2022  MCH 29.9 10/26/2022   PLT 127 (L) 10/26/2022   MCHC 31.9 10/26/2022   RDW 13.4  10/26/2022   LYMPHSABS 1.1 10/14/2022   MONOABS 0.6 10/14/2022   EOSABS 0.1 10/14/2022   BASOSABS 0.1 10/14/2022     Last metabolic panel Lab Results  Component Value Date   NA 141 10/27/2022   K 4.4 10/27/2022   CL 109 10/27/2022   CO2 25 10/27/2022   BUN 26 (H) 10/27/2022   CREATININE 0.72 10/27/2022   GLUCOSE 142 (H) 10/27/2022   GFRNONAA >60 10/27/2022   GFRAA 73 12/13/2019   CALCIUM 7.9 (L) 10/27/2022   PHOS 2.9 10/27/2022   PROT 4.5 (L) 10/27/2022   ALBUMIN 1.8 (L) 10/27/2022   BILITOT 1.0 10/27/2022   ALKPHOS 99 10/27/2022   AST 27 10/27/2022   ALT 28 10/27/2022   ANIONGAP 7 10/27/2022    GFR: Estimated Creatinine Clearance: 95.7 mL/min (by C-G formula based on SCr of 0.72 mg/dL).  No results found for this or any previous visit (from the past 240 hour(s)).    Radiology Studies: No results found.    LOS: 12 days    Jacquelin Hawking, MD [facility] 10/27/2022, 12:58 PM   If 7PM-7AM, please contact night-coverage [email or web address]

## 2022-10-27 NOTE — Progress Notes (Signed)
Daily Progress Note   Patient Name: Steven Wolf       Date: 10/27/2022 DOB: [birth year]  Age: 67 y.o. MRN#: [id number] Attending Physician: Narda Bonds, MD Primary Care Physician: Pincus Sanes, MD Admit Date: 10/14/2022 Length of Stay: 12 days  Reason for Consultation/Follow-up: Establishing goals of care and symptom management  Subjective:   CC: Patient continues to receive TPN with tube feeds on hold. Following up regarding complex medical decision making and symptom management.   Subjective:  Reviewed EMR prior to presenting to bedside.  In past 24 hours at time of EMR review, patient had received IV Dilaudid 0.5 mg x 2 doses.  Patient has not received any IV Zofran or Compazine.  Discussed care with bedside RN for updates.  Patient remains on TPN at this time.  Surgery determining when tube feeds can be attempted again through J-tube.  Surgery note this morning noted planned drain injection of the J-tube to see if there was a leakage and or if the balloon itself is no longer functioning.  Presented to bedside to check on patient.  No family present at bedside with patient.  Patient feels that his pain is currently well-managed on as needed low-dose Dilaudid.  Patient did not feel he needed medications for nausea or vomiting at this time.  Spent time providing emotional support via active listening.  All questions answered at that time.  Noted palliative medicine team continue to follow along with patient's journey.  Review of Systems Pain managed on current IV opioid dosing Objective:   Vital Signs:  BP (!) 139/96 (BP Location: Left Arm)   Pulse 93   Temp 98 F (36.7 C) (Oral)   Resp 18   Ht 6' (1.829 m)   Wt 82 kg   SpO2 97%   BMI 24.52 kg/m   Physical Exam: General: NAD, alert, laying in bed, pleasant , chronically ill appearing Eyes: No drainage noted HENT: moist mucous membranes Cardiovascular: RRR Respiratory: no increased work of breathing noted, not in  respiratory distress Abdomen: distended Neuro: A&Ox4, following commands easily Psych: appropriately answers all questions  Imaging:  I personally reviewed recent imaging.   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 Since admission, patient has received continued workup through oncology, GI, and surgery. Patient underwent J-tube placement for management of tube feeds in setting of gastric obstruction. Plan is for patient to start palliative immunotherapy after hospitalization. Palliative medicine team consulted to assist with complex medical decision making and symptom management.   Recommendations/Plan: # Complex medical decision making/goals of care:      -Continues open discussions with his care team regarding pathways for medical care moving forward.  Currently plan is for patient to receive palliative immunotherapy in the outpatient setting.                -  Code Status: Full Code                                -Dr. Mosetta Putt already discussed CODE STATUS with patient on 7/29 who noted continuation of full code at this time  though very open to further discussions about changing CODE STATUS to DNR should cancer directed therapies not go as hoped.   # Symptom management                -Pain, abdominal in the setting of metastatic gastric cancer with peritoneal mets and gastric outlet obstruction                               -Discontinue oxycodone since transitioning to IV meds                               -Continue IV Dilaudid to 0.5-1 mg every 2 hours as needed for pain                  -Nausea/vomiting, in setting of  metastatic gastric cancer with peritoneal mets and gastric outlet obstruction                               -Continue Zofran 4 mg every 6 hours as needed                               -Continue Compazine 5 mg every 6 hours as needed                               -EKG on 7/30 showed QTc of 452.                   -Constipation Patient noted BM on 7/30.                                -Discontinue senna    -Consider soapsuds enema as needed.  Patient had better response to enema then suppository as per patient's report.                               -Encourage ambulation as able    -Anxiety   -Discontinuing Prozac 20mg  po daily on 8/3 since oral medications on hold. Consider restarting via tube once J tube cleared for use.    -Continue IV Ativan 0.5mg  q4hrs prn   # Psycho-social/Spiritual Support:  - Support System: wife, sister-in-law   # Discharge Planning:  To Be Determined                -If continues current medical pathway, will need outpatient palliative medicine follow referral either at [facility] or home.   Discussed with: patient, patient's wife, RN  Thank you for allowing the palliative care team to participate in the care Steven Wolf.  Alvester Morin, DO Palliative Care Provider PMT # [phone]  If patient remains symptomatic despite maximum doses, please call PMT at [phone] between 0700 and 1900. Outside of these hours, please call attending, as PMT does not have night coverage.  *Please note that this is a verbal dictation therefore any spelling or grammatical errors are due to the "[employer] One" system interpretation.

## 2022-10-27 NOTE — Progress Notes (Signed)
Pt had one small emesis thick and green in color . Similar in appearance to drainage seeping from midline incision at belly button. MD aware verbal orders to keep abd pad on incision for absorption. Will continue to monitor.

## 2022-10-27 NOTE — Progress Notes (Signed)
11 Days Post-Op   Subjective/Chief Complaint: Patient with 3 bowel movements.  Denies abdominal pain.  Denies nausea.  Drainage noted from the inferior portion of his incision and J-tube by nursing staff last night.   Objective: Vital signs in last 24 hours: Temp:  [98 F (36.7 C)-98.6 F (37 C)] 98 F (36.7 C) (08/04 0442) Pulse Rate:  [93-100] 93 (08/04 0442) Resp:  [18] 18 (08/04 0442) BP: (139-145)/(96-100) 139/96 (08/04 0442) SpO2:  [96 %-97 %] 97 % (08/04 0442) Weight:  [82 kg] 82 kg (08/04 0500) Last BM Date : 10/26/22  Intake/Output from previous day: 08/03 0701 - 08/04 0700 In: 120 [P.O.:120] Out: 300 [Urine:300] Intake/Output this shift: No intake/output data recorded.  Incision is intact.  There is bilious drainage from the inferior portion of the incision.  J-tube site appears clean and no drainage noted from that.  No cellulitis.  Lab Results:  Recent Labs    10/26/22 0553  WBC 8.6  HGB 11.2*  HCT 35.1*  PLT 127*   BMET Recent Labs    10/25/22 0500 10/26/22 0553  NA 144 142  K 3.0* 3.4*  CL 108 110  CO2 29 27  GLUCOSE 158* 166*  BUN 30* 26*  CREATININE 0.75 0.68  CALCIUM 7.9* 7.9*   PT/INR No results for input(s): "LABPROT", "INR" in the last 72 hours. ABG No results for input(s): "PHART", "HCO3" in the last 72 hours.  Invalid input(s): "PCO2", "PO2"  Studies/Results: No results found.  Anti-infectives: Anti-infectives (From admission, onward)    Start     Dose/Rate Route Frequency Ordered Stop   10/24/22 0900  ceFEPIme (MAXIPIME) 2 g in sodium chloride 0.9 % 100 mL IVPB        2 g 200 mL/hr over 30 Minutes Intravenous Every 12 hours 10/24/22 0805     10/24/22 0900  metroNIDAZOLE (FLAGYL) IVPB 500 mg        500 mg 100 mL/hr over 60 Minutes Intravenous Every 12 hours 10/24/22 0805     10/16/22 0730  cefoTEtan (CEFOTAN) 2 g in sodium chloride 0.9 % 100 mL IVPB        2 g 200 mL/hr over 30 Minutes Intravenous On call to O.R. 10/15/22  1051 10/16/22 1845       Assessment/Plan: s/p Procedure(s): DIAGNOSTIC LAPAROSCOPY, OPEN J TUBE PLACEMENT (N/A) INSERTION PORT-A-CATH (N/A) No further nausea.  I would not use the J-tube for now  More than likely has developed leakage from around the J-tube site and its fistulizing through his lower incision.  Will plan on drain injection of the J-tube to see if there is leakage and/or if the balloon itself is no longer functioning.  He is not ill from this.  Nothing else needs to be done at this point in time for this.  Continue TNA for now.  Okay to allow ice chips by mouth as tolerated.  LOS: 12 days    Clovis Pu  10/27/2022

## 2022-10-27 NOTE — Plan of Care (Signed)
  Problem: Education: Goal: Knowledge of General Education information will improve Description: Including pain rating scale, medication(s)/side effects and non-pharmacologic comfort measures Outcome: Progressing   Problem: Clinical Measurements: Goal: Ability to maintain clinical measurements within normal limits will improve Outcome: Progressing   Problem: Coping: Goal: Level of anxiety will decrease Outcome: Progressing   Problem: Elimination: Goal: Will not experience complications related to bowel motility Outcome: Progressing Goal: Will not experience complications related to urinary retention Outcome: Progressing   Problem: Pain Managment: Goal: General experience of comfort will improve Outcome: Progressing   Problem: Safety: Goal: Ability to remain free from injury will improve Outcome: Progressing   Problem: Skin Integrity: Goal: Risk for impaired skin integrity will decrease Outcome: Progressing   Problem: Education: Goal: Ability to describe self-care measures that may prevent or decrease complications (Diabetes Survival Skills Education) will improve Outcome: Progressing Goal: Individualized Educational Video(s) Outcome: Progressing   Problem: Coping: Goal: Ability to adjust to condition or change in health will improve Outcome: Progressing   Problem: Fluid Volume: Goal: Ability to maintain a balanced intake and output will improve Outcome: Progressing   Problem: Health Behavior/Discharge Planning: Goal: Ability to identify and utilize available resources and services will improve Outcome: Progressing Goal: Ability to manage health-related needs will improve Outcome: Progressing   Problem: Metabolic: Goal: Ability to maintain appropriate glucose levels will improve Outcome: Progressing   Problem: Nutritional: Goal: Progress toward achieving an optimal weight will improve Outcome: Progressing   Problem: Skin Integrity: Goal: Risk for impaired  skin integrity will decrease Outcome: Progressing   Problem: Tissue Perfusion: Goal: Adequacy of tissue perfusion will improve Outcome: Progressing

## 2022-10-27 NOTE — Progress Notes (Signed)
Pharmacy Antibiotic Note  Steven Wolf is a 67 y.o. male admitted on 10/14/2022 with  an intra-abdominal infection . Pharmacy has been consulted for cefepime dosing.  Day 4 Cefepime/Flagyl VSS and labs stable   Plan: Cont cefepime 2g IV q12hrs Continue metronidazole 500mg  IV q12hrs  Monitor renal function and overall clinical status  What is plan for length of antibiotics?  Height: 6' (182.9 cm) Weight: 82 kg (180 lb 12.4 oz) IBW/kg (Calculated) : 77.6  Temp (24hrs), Avg:98.3 F (36.8 C), Min:98 F (36.7 C), Max:98.6 F (37 C)  Recent Labs  Lab 10/21/22 0952 10/22/22 0547 10/23/22 0558 10/24/22 0924 10/25/22 0500 10/26/22 0553  WBC 6.7 5.1 10.5  --   --  8.6  CREATININE 0.75 0.72 0.90 0.79 0.75 0.68    Estimated Creatinine Clearance: 95.7 mL/min (by C-G formula based on SCr of 0.68 mg/dL).    Allergies  Allergen Reactions   Ramipril Other (See Comments)    Elevated liver enzymes   Penicillins Other (See Comments)    ? Reaction (as child)    Antimicrobials this admission: 8/1 cefepime >> 8/1 metronidazole >>  Dose adjustments this admission: N/A  Microbiology results: None   Thank you for allowing pharmacy to be a part of this patient's care.  Berkley Harvey 10/27/2022 7:55 AM

## 2022-10-27 NOTE — Progress Notes (Signed)
PHARMACY - TOTAL PARENTERAL NUTRITION CONSULT NOTE   Indication:  Gastric Outlet Obstruction  Patient Measurements: Height: 6' (182.9 cm) Weight: 82 kg (180 lb 12.4 oz) IBW/kg (Calculated) : 77.6 TPN AdjBW (KG): 72.3 Body mass index is 24.52 kg/m. Usual Weight: 72kg  Assessment:  67 YO male with metastatic gastric cancer and outlet obstruction. Patient had J-tube placement 7/24 due to poor oral intake secondary to gastric outlet obstruction. He has been unable to advance tube feeds since J-tube placement. KUB from 7/31 showed air-fluid levels of the small bowel and CT scan showed pneumatosis in the area around the J-tube. TPN to start in the setting of gastric outlet obstruction and inability to advance enteral feeding.  Glucose / Insulin: No DM hx CBGs at goal last 24hr (9 units SSI used) Electrolytes:  All WNL Renal:  Scr <1, stable Hepatic:  LFTs wnl, stable Albumin low at 1.8 Initial TGs 150--stable Intake / Output; MIVF:  UOP: -1000 mL/24 hrs Intake: not documented GI Imaging: - 7/19 a/p: Gastric wall thickening, consistent with known gastric cancer. Perigastric fat stranding, concerning for local invasion. New prominent subcentimeter gastrohepatic ligament and left para-aortic lymph nodes, concerning for nodal metastatic disease. - 7/31 a/p: Interval placement of jejunostomy catheter into the proximal jejunum. Pneumatosis of proximal jejunum beginning just distal to the jejunostomy catheter, extending into the mesentery. Mid and distal small bowel loops are distended without wall thickening or pneumatosis. Small amount of free intraperitoneal gas and scattered abdominal and pelvic ascites, new since previous, potentially related to recent surgery. Persistent gastric wall thickening in the body and antrum with gastrohepatic ligament adenopathy as previously noted, consistent with history of gastric carcinoma. Patchy atelectasis or infiltrates posteriorly in both lung bases, new  since previous. - 8/4: per surgery, possible J tube leak - to evaluate further GI Surgeries / Procedures: - 7/24: diagnostic laparoscopy, open J tube placement, port-a-cath insertion, upper EUS  Central access: port-a-cath placed 7/24 TPN start date: 8/1  Nutritional Goals: Goal TPN rate is 90 mL/hr (provides 110 g of protein and 2219 kcals per day)  RD Assessment: Estimated Needs Total Energy Estimated Needs: 2150-2350 Total Protein Estimated Needs: 105-120g Total Fluid Estimated Needs: 2.1L/day RD also recommended thiamine 100mg  IV daily x5 days due to risk for refeeding syndrome   Current Nutrition:  Clear liquids and TPN  Plan:  At 1800: - Continue TPN at goal rate of 90 mL/hr - Electrolytes in TPN:  Na 41mEq/L K 49mEq/L Ca 42mEq/L Mg 54mEq/L Phos 53mmol/L Cl:Ac 1:1 - Add standard MVI and trace elements to TPN - Change to Sensitive q6h SSI and adjust as needed  - Monitor TPN labs daily while escalating to goal rate and on Mon/Thurs and as needed - Continue Thiamine 100mg  IV daily x5 days   Hessie Knows, PharmD, BCPS Secure Chat if ?s 10/27/2022 10:58 AM

## 2022-10-28 ENCOUNTER — Inpatient Hospital Stay (HOSPITAL_COMMUNITY): Payer: Medicare PPO

## 2022-10-28 DIAGNOSIS — K311 Adult hypertrophic pyloric stenosis: Secondary | ICD-10-CM | POA: Diagnosis not present

## 2022-10-28 DIAGNOSIS — Z515 Encounter for palliative care: Secondary | ICD-10-CM | POA: Diagnosis not present

## 2022-10-28 DIAGNOSIS — C169 Malignant neoplasm of stomach, unspecified: Secondary | ICD-10-CM | POA: Diagnosis not present

## 2022-10-28 DIAGNOSIS — G893 Neoplasm related pain (acute) (chronic): Secondary | ICD-10-CM | POA: Diagnosis not present

## 2022-10-28 DIAGNOSIS — F419 Anxiety disorder, unspecified: Secondary | ICD-10-CM | POA: Diagnosis not present

## 2022-10-28 LAB — GLUCOSE, CAPILLARY
Glucose-Capillary: 136 mg/dL — ABNORMAL HIGH (ref 70–99)
Glucose-Capillary: 143 mg/dL — ABNORMAL HIGH (ref 70–99)
Glucose-Capillary: 144 mg/dL — ABNORMAL HIGH (ref 70–99)
Glucose-Capillary: 148 mg/dL — ABNORMAL HIGH (ref 70–99)
Glucose-Capillary: 152 mg/dL — ABNORMAL HIGH (ref 70–99)
Glucose-Capillary: 154 mg/dL — ABNORMAL HIGH (ref 70–99)
Glucose-Capillary: 163 mg/dL — ABNORMAL HIGH (ref 70–99)

## 2022-10-28 MED ORDER — TRAVASOL 10 % IV SOLN
INTRAVENOUS | Status: AC
  Filled 2022-10-28: qty 1101.6

## 2022-10-28 MED ORDER — IOHEXOL 300 MG/ML  SOLN
100.0000 mL | Freq: Once | INTRAMUSCULAR | Status: AC | PRN
  Administered 2022-10-28: 100 mL via INTRAVENOUS

## 2022-10-28 MED ORDER — SODIUM CHLORIDE (PF) 0.9 % IJ SOLN
INTRAMUSCULAR | Status: AC
  Filled 2022-10-28: qty 50

## 2022-10-28 MED ORDER — INSULIN ASPART 100 UNIT/ML IJ SOLN
0.0000 [IU] | Freq: Three times a day (TID) | INTRAMUSCULAR | Status: DC
  Administered 2022-10-28 (×2): 2 [IU] via SUBCUTANEOUS
  Administered 2022-10-29: 1 [IU] via SUBCUTANEOUS
  Administered 2022-10-29 – 2022-10-30 (×3): 2 [IU] via SUBCUTANEOUS

## 2022-10-28 MED ORDER — IOHEXOL 300 MG/ML  SOLN
100.0000 mL | Freq: Once | INTRAMUSCULAR | Status: AC | PRN
  Administered 2022-10-28: 100 mL

## 2022-10-28 MED ORDER — HYDRALAZINE HCL 20 MG/ML IJ SOLN: 10.0000 mg | Freq: Four times a day (QID) | INTRAMUSCULAR | Status: DC | PRN

## 2022-10-28 NOTE — Progress Notes (Signed)
SLADE ROEPKE   DOB:1955-05-16   BM#:[id number]   UUV#:[id number]  Medical oncology follow-up note.  Subjective: Patient remains to be in hospital, he has been off tube feeds for few days, nausea has improved, he is tolerating TPN well.  He was walking in the hallway with his nurse and when I saw him.  Pain is controlled,.   Objective:  Vitals:   10/28/22 1220 10/28/22 1220  BP: (!) 137/95 (!) 137/95  Pulse: 88 88  Resp: 18 18  Temp: 97.9 F (36.6 C) 97.9 F (36.6 C)  SpO2: 98% 98%    Body mass index is 24.79 kg/m.  Intake/Output Summary (Last 24 hours) at 10/28/2022 2050 Last data filed at 10/28/2022 1856 Gross per 24 hour  Intake 3234.79 ml  Output --  Net 3234.79 ml     Sclerae unicteric Abdomen bloated, with moderate diffuse tenderness, decreased bowel sounds.  MSK no focal spinal tenderness, no peripheral edema  Neuro nonfocal    CBG (last 3)  Recent Labs    10/28/22 0805 10/28/22 1141 10/28/22 1530  GLUCAP 143* 154* 163*     Labs:   Urine Studies No results for input(s): "UHGB", "CRYS" in the last 72 hours.  Invalid input(s): "UACOL", "UAPR", "USPG", "UPH", "UTP", "UGL", "UKET", "UBIL", "UNIT", "UROB", "ULEU", "UEPI", "UWBC", "URBC", "UBAC", "CAST", "UCOM", "BILUA"  Basic Metabolic Panel: Recent Labs  Lab 10/24/22 0924 10/25/22 0500 10/26/22 0553 10/27/22 0901 10/28/22 0549  NA 146* 144 142 141 138  K 3.4* 3.0* 3.4* 4.4 3.9  CL 108 108 110 109 104  CO2 27 29 27 25 27   GLUCOSE 113* 158* 166* 142* 153*  BUN 32* 30* 26* 26* 26*  CREATININE 0.79 0.75 0.68 0.72 0.79  CALCIUM 8.7* 7.9* 7.9* 7.9* 8.0*  MG 2.2 2.1 2.0 2.2 2.1  PHOS 3.4 2.4* 3.3 2.9 3.3   GFR Estimated Creatinine Clearance: 95.7 mL/min (by C-G formula based on SCr of 0.79 mg/dL). Liver Function Tests: Recent Labs  Lab 10/24/22 0924 10/25/22 0500 10/26/22 0553 10/27/22 0901 10/28/22 0549  AST 14* 18 19 27 24   ALT 26 23 23 28 31   ALKPHOS 74 70 76 99 149*  BILITOT 1.5* 1.3* 1.0  1.0 0.7  PROT 5.3* 4.7* 4.3* 4.5* 5.2*  ALBUMIN 2.2* 1.9* 1.8* 1.8* 2.1*   No results for input(s): "LIPASE", "AMYLASE" in the last 168 hours. No results for input(s): "AMMONIA" in the last 168 hours. Coagulation profile No results for input(s): "INR", "PROTIME" in the last 168 hours.  CBC: Recent Labs  Lab 10/22/22 0547 10/23/22 0558 10/26/22 0553  WBC 5.1 10.5 8.6  HGB 13.6 12.8* 11.2*  HCT 41.3 39.3 35.1*  MCV 90.8 91.8 93.6  PLT 173 153 127*   Cardiac Enzymes: No results for input(s): "CKTOTAL", "CKMB", "CKMBINDEX", "TROPONINI" in the last 168 hours. BNP: Invalid input(s): "POCBNP" CBG: Recent Labs  Lab 10/28/22 0001 10/28/22 0622 10/28/22 0805 10/28/22 1141 10/28/22 1530  GLUCAP 136* 144* 143* 154* 163*   D-Dimer No results for input(s): "DDIMER" in the last 72 hours. Hgb A1c No results for input(s): "HGBA1C" in the last 72 hours. Lipid Profile Recent Labs    10/28/22 0549  TRIG 148   Thyroid function studies No results for input(s): "TSH", "T4TOTAL", "T3FREE", "THYROIDAB" in the last 72 hours.  Invalid input(s): "FREET3" Anemia work up No results for input(s): "VITAMINB12", "FOLATE", "FERRITIN", "TIBC", "IRON", "RETICCTPCT" in the last 72 hours. Microbiology No results found for this or any previous visit (from the  past 240 hour(s)).    Studies:  CT ABDOMEN PELVIS W CONTRAST  Result Date: 10/28/2022 CLINICAL DATA:  Postoperative abdominal pain. EXAM: CT ABDOMEN AND PELVIS WITH CONTRAST TECHNIQUE: Multidetector CT imaging of the abdomen and pelvis was performed using the standard protocol following bolus administration of intravenous contrast. RADIATION DOSE REDUCTION: This exam was performed according to the departmental dose-optimization program which includes automated exposure control, adjustment of the mA and/or kV according to patient size and/or use of iterative reconstruction technique. CONTRAST:  OMNIPAQUE IOHEXOL 300 MG/ML SOLN,  OMNIPAQUE IOHEXOL 300 MG/ML SOLN COMPARISON:  October 23, 2022. FINDINGS: Lower chest: Mild bibasilar subsegmental atelectasis. Gas is now seen in the anterior mediastinal region. Hepatobiliary: Stable gallbladder distention. No biliary dilatation is noted. No cholelithiasis is noted. Stable hepatic cysts. Pancreas: Unremarkable. No pancreatic ductal dilatation or surrounding inflammatory changes. Spleen: Normal in size without focal abnormality. Adrenals/Urinary Tract: Adrenal glands are unremarkable. Bilateral renal cysts are noted for which no further follow-up is required. No hydronephrosis or renal obstruction. Urinary bladder is unremarkable. Stomach/Bowel: Continued gastric wall thickening and body and antrum consistent with history of gastric carcinoma. Jejunostomy tube is again noted. Mildly dilated small bowel loops are noted in the right lower quadrant with mild wall thickening, with this appears to be overall improved compared to prior exam. No colonic dilatation is noted. The pneumatosis noted on prior exam is nearly resolved, although there remains a large amount of free air both adjacent to the jejunal loops as well as in the epigastric region, which potentially is postsurgical in etiology. Status post appendectomy. There is no definite evidence of leakage of contrast. Vascular/Lymphatic: No significant vascular abnormality is noted. 8 mm gastrohepatic lymph node is noted suggesting metastatic disease. Reproductive: Stable mild prostatic enlargement. Other: Mild ascites is noted. No hernia is noted. Free air is noted in the anterior abdominal wall which is increased compared to prior exam. Musculoskeletal: No acute or significant osseous findings. IMPRESSION: Continued gastric wall thickening of body and antrum consistent with history of gastric carcinoma. 8 mm gastrohepatic lymph node is also noted suggesting metastatic disease. Continued presence of jejunostomy tube. Pneumatosis involving jejunum on  prior exam is nearly resolved, and there is significantly decreased small bowel dilatation compared to prior exam as well. However, there remains a large amount of free air adjacent to small bowel loops and in the mesentery as well as in the epigastric region. An increased amount of air is seen in the subcutaneous tissues of the anterior abdominal wall of the right lower quadrant as well as air seen extending into the anterior mediastinum in the visualized chest. Mild ascites is noted. While this free air may be postsurgical in etiology, the possibility of bowel perforation still cannot be excluded. However, there is no definite evidence of extravasated contrast on this exam. These results will be called to the ordering clinician or representative by the Radiologist Assistant, and communication documented in the PACS or zVision Dashboard. Electronically Signed   By: Lupita Raider M.D.   On: 10/28/2022 10:43    Assessment: 67 y.o. 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  This does significant amount  of free air in abdomen and subcutaneous tissue of anterior abdominal wall.  -I am not sure if we should try tube feeding again, he previously did not tolerate after repeated attempts. I am okay for him to go home with TPN only, and let him started systemic treatment for his gastric cancer, and try tube feeding in 3 to 4 weeks to see if he can tolerate.  I will send a message to general surgeon Dr. Magnus Ivan, to see what his recommendation about his tube feeds, and if he is concerned about this free air in abdomen and the subcutaneous tissue. -I have canceled his appointment  tomorrow in my clinic, will reschedule when he is ready to go home.  [name], MD 10/28/2022

## 2022-10-28 NOTE — Progress Notes (Signed)
Nutrition Follow-up  DOCUMENTATION CODES:   Severe malnutrition in context of chronic illness  INTERVENTION:   -TPN management per Pharmacy -Daily weights  Goal for tube feeding once able to resume:  -Osmolite 1.5 @ 20 ml/hr via J-tube. -Recommend advance by 10 ml every 24 hours to goal rate of 65 ml/hr. -Provides at goal: 2340 kcals, 97g protein and 1188 ml H2O.  NUTRITION DIAGNOSIS:   Severe Malnutrition related to chronic illness, cancer and cancer related treatments as evidenced by percent weight loss, energy intake < or equal to 75% for > or equal to 1 month, mild fat depletion.  Ongoing  GOAL:   Patient will meet greater than or equal to 90% of their needs  Meeting with TPN  MONITOR:   Labs, Weight trends, I & O's (TPN)  ASSESSMENT:   67 y.o. male with medical history of recent diagnosis of adenocarcinoma, staging is still pending.  Patient went to oncology today due to poor p.o. intake, he barely is able to drink 8 ounce of water and 1-2 shakes a day.  Due to this he was directly admitted to hospital per oncology request for PEG tube placement.  Per surgery note, pt's J-tube not to be used at this time. Having CT scan this morning.   TPN at goal rate, 90 ml/hr, providing 2219 kcals and 110g protein.  Admission weight: 159 lbs Current weight: 182 lbs I/Os: +15.8L since admit  Medications: Thiamine  Labs reviewed: CBGs: 136-154  Diet Order:   Diet Order             Diet NPO time specified Except for: Ice Chips, Other (See Comments)  Diet effective now                   EDUCATION NEEDS:   Education needs have been addressed  Skin:  Skin Assessment: Skin Integrity Issues: Skin Integrity Issues:: Incisions Incisions: 7/24  Last BM:  8/3 -type 5  Height:   Ht Readings from Last 1 Encounters:  10/16/22 6' (1.829 m)    Weight:   Wt Readings from Last 1 Encounters:  10/28/22 82.9 kg    BMI:  Body mass index is 24.79 kg/m.  Estimated  Nutritional Needs:   Kcal:  2150-2350  Protein:  105-120g  Fluid:  2.1L/day   Tilda Franco, MS, RD, LDN Inpatient Clinical Dietitian Contact information available via Amion

## 2022-10-28 NOTE — Plan of Care (Signed)
Discussed earlier in the shift plan of care for the evening, pain management and night time medications with some teach back displayed.  What is important to the patient tonight is rest and [place] ice..   Problem: Education: Goal: Knowledge of General Education information will improve Description: Including pain rating scale, medication(s)/side effects and non-pharmacologic comfort measures Outcome: Progressing

## 2022-10-28 NOTE — Progress Notes (Signed)
PROGRESS NOTE    Steven Wolf  Steven Wolf DOB: 12/08/1955 DOA: 10/14/2022 PCP: Pincus Sanes, MD   Brief Narrative: Steven Wolf is a 67 y.o. male with a history of gastric adenocarcinoma, hypertension, depression.  Patient presented secondary to need for J-tube placement for poor oral intake secondary to gastric outlet obstruction.  Post G-tube placement, patient with inability to advance tube feeds.  General surgery consulted for concern of ileus versus bowel obstruction. Patient continued to fail advancement of tube feed rate. Repeat imaging was concerning for development of pneumatosis. Tube feeds held and TPN started on 8/1.   Assessment and Plan:  Gastric outlet obstruction Secondary to recently diagnosed gastric adenocarcinoma.  Patient was sent to the hospital for placement of a J-tube which was performed on 7/24.  Since placement, patient has not been tolerating advancement of tube feeds secondary to recurrent nausea and vomiting.  Nausea/vomiting Possible ileus versus obstruction Pneumotosis Related to tube feeds.  X-ray imaging was concerning for ileus versus obstruction.  General surgery was consulted and recommended conservative management with treatment for constipation.  Patient with multiple attempts to readvance tube feeds without success and resultant recurrent nausea and vomiting.  KUB concerning for pneumatosis which was confirmed on CT scan. Per general surgery, likely related to J-tube placement -General surgery recommendations: NPO with ice chips, stop PO meds, stop tube feeds, TPN, Cefepime/Flagyl, CT scan today  Gastric adenocarcinoma Patient follows with Dr. Mosetta Putt as an outpatient with plan to start chemotherapy.  Primary hypertension Irbesartan discontinued secondary to NPO status and inability to use J-tube. -Hydralazine IV PRN with parameters  GERD -Continue Protonix  Depression Prozac held secondary to NPO status  Hypokalemia Potassium  supplementation as needed  Severe malnutrition Dietitian recommendations (7/31): Monitor magnesium, potassium, and phosphorus for at least 3 days, MD to replete as needed, as pt is at risk for refeeding syndrome. Continue Osmolite 1.5 @ 20 ml/hr via J-tube. Recommend advance by 10 ml every 24 hours to goal rate of 65 ml/hr. Provides at goal: 2340 kcals, 97g protein and 1188 ml H2O.   DVT prophylaxis: Lovenox Code Status:   Code Status: Full Code Family Communication: None at bedside Disposition Plan: Discharge pending ability for patient to tolerate tube feeds and continued general surgery recommendations   Consultants:  General surgery [place] gastroenterology Medical oncology Palliative care medicine  Procedures:  7/24: EUS  Antimicrobials: Cefepime Flagyl    Subjective: No nausea, vomiting, abdominal pain.  Patient reports continued bowel movements.   Objective: BP (!) 157/100   Pulse 79   Temp 97.8 F (36.6 C) (Oral)   Resp 18   Ht 6' (1.829 m)   Wt 82.9 kg   SpO2 98%   BMI 24.79 kg/m   Examination:  General exam: Appears calm and comfortable Respiratory system: Clear to auscultation. Respiratory effort normal. Cardiovascular system: S1 & S2 heard, RRR. Gastrointestinal system: Abdomen is mildly distended, soft and nontender. Normal bowel sounds heard. Central nervous system: Alert and oriented.  Musculoskeletal: No edema. No calf tenderness Psychiatry: Judgement and insight appear normal. Mood & affect appropriate.    Data Reviewed: I have personally reviewed following labs and imaging studies  CBC Lab Results  Component Value Date   WBC 8.6 10/26/2022   RBC 3.75 (L) 10/26/2022   HGB 11.2 (L) 10/26/2022   HCT 35.1 (L) 10/26/2022   MCV 93.6 10/26/2022   MCH 29.9 10/26/2022   PLT 127 (L) 10/26/2022   MCHC 31.9 10/26/2022  RDW 13.4 10/26/2022   LYMPHSABS 1.1 10/14/2022   MONOABS 0.6 10/14/2022   EOSABS 0.1 10/14/2022   BASOSABS 0.1  10/14/2022     Last metabolic panel Lab Results  Component Value Date   NA 138 10/28/2022   K 3.9 10/28/2022   CL 104 10/28/2022   CO2 27 10/28/2022   BUN 26 (H) 10/28/2022   CREATININE 0.79 10/28/2022   GLUCOSE 153 (H) 10/28/2022   GFRNONAA >60 10/28/2022   GFRAA 73 12/13/2019   CALCIUM 8.0 (L) 10/28/2022   PHOS 3.3 10/28/2022   PROT 5.2 (L) 10/28/2022   ALBUMIN 2.1 (L) 10/28/2022   BILITOT 0.7 10/28/2022   ALKPHOS 149 (H) 10/28/2022   AST 24 10/28/2022   ALT 31 10/28/2022   ANIONGAP 7 10/28/2022    GFR: Estimated Creatinine Clearance: 95.7 mL/min (by C-G formula based on SCr of 0.79 mg/dL).  No results found for this or any previous visit (from the past 240 hour(s)).    Radiology Studies: No results found.    LOS: 13 days    Jacquelin Hawking, MD [facility] 10/28/2022, 9:58 AM   If 7PM-7AM, please contact night-coverage [email or web address]

## 2022-10-28 NOTE — Progress Notes (Signed)
PHARMACY - TOTAL PARENTERAL NUTRITION CONSULT NOTE   Indication:  Gastric Outlet Obstruction  Patient Measurements: Height: 6' (182.9 cm) Weight: 82.9 kg (182 lb 12.2 oz) IBW/kg (Calculated) : 77.6 TPN AdjBW (KG): 72.3 Body mass index is 24.79 kg/m. Usual Weight: 72kg  Assessment:  67 YO male with metastatic gastric cancer and outlet obstruction. Patient had J-tube placement 7/24 due to poor oral intake secondary to gastric outlet obstruction. He has been unable to advance tube feeds since J-tube placement. KUB from 7/31 showed air-fluid levels of the small bowel and CT scan showed pneumatosis in the area around the J-tube. TPN to start in the setting of gastric outlet obstruction and inability to advance enteral feeding.  Glucose / Insulin: No DM hx CBGs at goal last 24hr (6 units SSI used) Electrolytes: All WNL Renal: Scr <1, stable Hepatic:  LFTs wnl x alk phos 149 slightly elevated (8/5)  Albumin low  TGs 148 8/5 Intake / Output; MIVF:  Urine x 4; emesis x 2 Intake: 60 ml GI Imaging: - 7/19 a/p: Gastric wall thickening, consistent with known gastric cancer. Perigastric fat stranding, concerning for local invasion. New prominent subcentimeter gastrohepatic ligament and left para-aortic lymph nodes, concerning for nodal metastatic disease. - 7/31 a/p: Interval placement of jejunostomy catheter into the proximal jejunum. Pneumatosis of proximal jejunum beginning just distal to the jejunostomy catheter, extending into the mesentery. Mid and distal small bowel loops are distended without wall thickening or pneumatosis. Small amount of free intraperitoneal gas and scattered abdominal and pelvic ascites, new since previous, potentially related to recent surgery. Persistent gastric wall thickening in the body and antrum with gastrohepatic ligament adenopathy as previously noted, consistent with history of gastric carcinoma. Patchy atelectasis or infiltrates posteriorly in both lung bases,  new since previous. - 8/4: per surgery, possible J tube leak - to evaluate further GI Surgeries / Procedures: - 7/24: diagnostic laparoscopy, open J tube placement, port-a-cath insertion, upper EUS  Central access: port-a-cath placed 7/24 TPN start date: 8/1  Nutritional Goals: Goal TPN rate is 90 mL/hr (provides 110 g of protein and 2219 kcals per day)  RD Assessment: Estimated Needs Total Energy Estimated Needs: 2150-2350 Total Protein Estimated Needs: 105-120g Total Fluid Estimated Needs: 2.1L/day RD also recommended thiamine 100mg  IV daily x5 days due to risk for refeeding syndrome   Current Nutrition:  Clear liquids and TPN  Plan:  At 1800: - Continue TPN at goal rate of 90 mL/hr - Electrolytes in TPN:  Na 43mEq/L K 17mEq/L Ca 72mEq/L Mg 32mEq/L Phos 29mmol/L Cl:Ac 1:1 - Add standard MVI and trace elements to TPN - Sensitive q8h SSI and adjust as needed  - Monitor TPN labs on Mon/Thurs and as needed - Continue Thiamine 100mg  IV daily x5 days   Herby Abraham, Pharm.D Use secure chat for questions 10/28/2022 7:37 AM

## 2022-10-28 NOTE — Progress Notes (Signed)
Daily Progress Note   Patient Name: Steven Wolf       Date: 10/28/2022 DOB: [birth year]  Age: 67 y.o. MRN#: [id number] Attending Physician: Narda Bonds, MD Primary Care Physician: Pincus Sanes, MD Admit Date: 10/14/2022 Length of Stay: 13 days  Reason for Consultation/Follow-up: Establishing goals of care and symptom management  Subjective:   CC: Patient continues to receive TPN with tube feeds on hold. Following up regarding complex medical decision making and symptom management.   Subjective:  Reviewed EMR prior to presenting to bedside.  Patient did not require any IV as needed medication for pain or nausea in past 24 hours. Surgery team evaluated patient this morning and noted concern about leakage around the J-tube from potential fistulizing through his lower incision.  CT surgery team has ordered CT for evaluation.  Awaiting results.  Presented to bedside to meet with patient.  Patient's wife, sister-in-law, and nephew present at bedside.  Able to review patient's symptom burden today.  Patient denies any pain at this time.  Patient notes he does have some episodes of nausea with minimal clear vomit though usually this is after he has tried to eat a lemon pop or ice.  Patient has continued with this because helps with his dry mouth symptoms.  Patient and family awaiting answers from surgery team regarding possible fistula.  Patient has been up and ambulating on TPN as per family reports.  Patient having bowel movements daily.  Provided emotional support reactive listening.  All questions answered at that time.  Noted this provider was going to service though palliative provider Dr. Linna Darner would be present starting tomorrow follow-up regarding patient's symptom burden and discussions moving forward.  Thank patient and family for allowing me to visit with him today.  Review of Systems Minimal vomiting  Objective:   Vital Signs:  BP (!) 157/100   Pulse 79   Temp 97.8 F (36.6  C) (Oral)   Resp 18   Ht 6' (1.829 m)   Wt 82.9 kg   SpO2 98%   BMI 24.79 kg/m   Physical Exam: General: NAD, alert, laying in bed, pleasant , chronically ill appearing Eyes: No drainage noted HENT: moist mucous membranes Cardiovascular: RRR Respiratory: no increased work of breathing noted, not in respiratory distress Abdomen: distended Neuro: A&Ox4, following commands easily Psych: appropriately answers all questions  Imaging:  I personally reviewed recent imaging.   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 Since admission, patient has received continued workup through oncology, GI, and surgery. Patient underwent J-tube placement for management of tube feeds in setting of gastric obstruction. Plan is for patient to start palliative immunotherapy after hospitalization. Palliative medicine team consulted to assist with complex medical decision making and symptom management.   Recommendations/Plan: # Complex medical decision making/goals of care:      -Continues open discussions with his care team regarding pathways for medical care moving forward.  Currently plan is for patient to receive palliative immunotherapy in the outpatient setting.                -  Code Status: Full Code                                -  Dr. Mosetta Putt already discussed CODE STATUS with patient on 7/29 who noted continuation of full code at this time though very open to further discussions about changing CODE STATUS to DNR should cancer directed therapies not go as hoped.   # Symptom management                -Pain, abdominal in the setting of  metastatic gastric cancer with peritoneal mets and gastric outlet obstruction                               -Discontinued oxycodone since transitioning to IV meds                               -Continue IV Dilaudid to 0.5-1 mg every 2 hours as needed for pain                  -Nausea/vomiting, in setting of metastatic gastric cancer with peritoneal mets and gastric outlet obstruction                               -Continue Zofran 4 mg every 6 hours as needed                               -Continue Compazine 5 mg every 6 hours as needed                               -EKG on 7/30 showed QTc of 452.                   -Constipation Patient noted BM on 7/30.                                -Discontinued senna    -Consider soapsuds enema as needed.  Patient had better response to enema then suppository as per patient's report.                               -Encourage ambulation as able    -Anxiety   -Discontinuing Prozac 20mg  po daily on 8/3 since oral medications on hold. Consider restarting via tube once J tube cleared for use.    -Continue IV Ativan 0.5mg  q4hrs prn   # Psycho-social/Spiritual Support:  - Support System: wife, sister-in-law   # Discharge Planning:  To Be Determined                -If continues current medical pathway, will need outpatient palliative medicine follow referral to [facility]. Informed patient of this.  Did not place referral at this time as awaiting further medical outcomes.  Discussed with: patient, patient's wife, RN  Thank you for allowing the palliative care team to participate in the care Steven Wolf.  Alvester Morin, DO Palliative Care Provider PMT # [phone]  If patient remains symptomatic despite maximum doses, please call PMT at [phone] between 0700 and 1900. Outside of these hours, please call attending, as PMT does not have night coverage.  *Please note that this is a verbal dictation therefore any spelling or grammatical errors are due to  the "[employer]" system interpretation.

## 2022-10-28 NOTE — Progress Notes (Signed)
12 Days Post-Op   Subjective/Chief Complaint: States he has not needed pain or nausea meds in about 24 hours. He is still having acute onset, small volume, emesis. Having BMs. Confirms new drainage from inferior aspect of midline incision over the weekend.  Objective: Vital signs in last 24 hours: Temp:  [97.8 F (36.6 C)-98.5 F (36.9 C)] 97.8 F (36.6 C) (08/05 0622) Pulse Rate:  [79-89] 79 (08/05 0623) Resp:  [18] 18 (08/04 1239) BP: (142-157)/(93-120) 157/100 (08/05 0623) SpO2:  [96 %-98 %] 98 % (08/05 0622) Weight:  [82.9 kg] 82.9 kg (08/05 0500) Last BM Date : 10/26/22  Intake/Output from previous day: 08/04 0701 - 08/05 0700 In: 3358.7 [P.O.:60; I.V.:1968.7; IV Piggyback:1330] Out: -  Intake/Output this shift: No intake/output data recorded.  Incision is intact.  There is bilious drainage from the inferior portion of the incision.  J-tube dressins is also stained with bile.  No cellulitis - there is skin irration where sutures are.   Lab Results:  Recent Labs    10/26/22 0553  WBC 8.6  HGB 11.2*  HCT 35.1*  PLT 127*   BMET Recent Labs    10/27/22 0901 10/28/22 0549  NA 141 138  K 4.4 3.9  CL 109 104  CO2 25 27  GLUCOSE 142* 153*  BUN 26* 26*  CREATININE 0.72 0.79  CALCIUM 7.9* 8.0*   PT/INR No results for input(s): "LABPROT", "INR" in the last 72 hours. ABG No results for input(s): "PHART", "HCO3" in the last 72 hours.  Invalid input(s): "PCO2", "PO2"  Studies/Results: No results found.  Anti-infectives: Anti-infectives (From admission, onward)    Start     Dose/Rate Route Frequency Ordered Stop   10/24/22 0900  ceFEPIme (MAXIPIME) 2 g in sodium chloride 0.9 % 100 mL IVPB        2 g 200 mL/hr over 30 Minutes Intravenous Every 12 hours 10/24/22 0805     10/24/22 0900  metroNIDAZOLE (FLAGYL) IVPB 500 mg        500 mg 100 mL/hr over 60 Minutes Intravenous Every 12 hours 10/24/22 0805     10/16/22 0730  cefoTEtan (CEFOTAN) 2 g in sodium  chloride 0.9 % 100 mL IVPB        2 g 200 mL/hr over 30 Minutes Intravenous On call to O.R. 10/15/22 1051 10/16/22 1845       Assessment/Plan: s/p Procedure(s): DIAGNOSTIC LAPAROSCOPY, OPEN J TUBE PLACEMENT (N/A) INSERTION PORT-A-CATH (N/A) Continue NPO, TPN Continue IV abx Do not use the J-tube for now  More than likely has developed leakage from around the J-tube site and its fistulizing through his lower incision.  CT scan ordered today to further evaluate - I discussed contrast injection via J tube with radiology tech downstairs.  He is not ill from this.  Okay to allow ice chips by mouth as tolerated. Will discuss CT results with patient and his family once CT is done.    LOS: 13 days    Adam Phenix 10/28/2022

## 2022-10-29 DIAGNOSIS — G893 Neoplasm related pain (acute) (chronic): Secondary | ICD-10-CM | POA: Diagnosis not present

## 2022-10-29 DIAGNOSIS — C169 Malignant neoplasm of stomach, unspecified: Secondary | ICD-10-CM | POA: Diagnosis not present

## 2022-10-29 DIAGNOSIS — K311 Adult hypertrophic pyloric stenosis: Secondary | ICD-10-CM | POA: Diagnosis not present

## 2022-10-29 DIAGNOSIS — Z515 Encounter for palliative care: Secondary | ICD-10-CM | POA: Diagnosis not present

## 2022-10-29 LAB — GLUCOSE, CAPILLARY
Glucose-Capillary: 153 mg/dL — ABNORMAL HIGH (ref 70–99)
Glucose-Capillary: 168 mg/dL — ABNORMAL HIGH (ref 70–99)
Glucose-Capillary: 165 mg/dL — ABNORMAL HIGH (ref 70–99)
Glucose-Capillary: 146 mg/dL — ABNORMAL HIGH (ref 70–99)
Glucose-Capillary: 146 mg/dL — ABNORMAL HIGH (ref 70–99)
Glucose-Capillary: 163 mg/dL — ABNORMAL HIGH (ref 70–99)

## 2022-10-29 MED ORDER — TRAVASOL 10 % IV SOLN
INTRAVENOUS | Status: AC
  Filled 2022-10-29: qty 1101.6

## 2022-10-29 NOTE — Progress Notes (Signed)
PMT no charge note.   Chart reviewed, sign out from Dr Patterson Hammersmith received, also discussed with surgical colleague Ms. Simaan, PA who has discussed with the patient. ? Fistula, based on recent CT, ongoing oncology f/u. Patient reportedly expressed interest in a family meeting later this week and expressed interest in more details about cancer treatment as well as palliative care/what to expect end of life. Will follow up in am on 10-30-22. No charge Rosalin Hawking MD

## 2022-10-29 NOTE — Consult Note (Addendum)
WOC Nurse Consult Note: Surgical team following for assessment and plan of care for leaking abd wound and possible fistula. Requested to apply a pouch over the site.  Just above the umbilicus there is a small "slit", approx .2X.2cm full thickness wound with mod amt green drainage on previous gauze dressing. Intact skin surrounding previous surgical site. Applied pouch over the location to attempt to contain the drainage; this increased in amt after the patient sat up in bed.  Dressing procedure/placement/frequency: Topical treatment orders provided for bedside nurses to perform as follows: Change pouch to abd PRN if leaking as follows: Clean with skin prep wipe, pat dry Apply one piece urostomy pouch [place] # 3) over small opening just above umbilicus To empty drainage, turn bottom spout so "the drop is showing." Turn spout back to off when finished. Please re-consult if further assistance is needed.   WOC team will assess effectiveness of pouching tomorrow.  4 sets of pouches ordered to the room for staff nurse's use.  Thank-you,  Cammie Mcgee MSN, RN, CWOCN, [place], CNS [phone]

## 2022-10-29 NOTE — Progress Notes (Signed)
PROGRESS NOTE    DENT Wolf  ZOX:[id number] DOB: 04/10/1955 DOA: 10/14/2022 PCP: Pincus Sanes, MD   Brief Narrative: Steven Wolf is a 67 y.o. male with a history of gastric adenocarcinoma, hypertension, depression.  Patient presented secondary to need for J-tube placement for poor oral intake secondary to gastric outlet obstruction.  Post G-tube placement, patient with inability to advance tube feeds.  General surgery consulted for concern of ileus versus bowel obstruction. Patient continued to fail advancement of tube feed rate. Repeat imaging was concerning for development of pneumatosis. Tube feeds held and TPN started on 8/1.  Antibiotics started.   Assessment and Plan:  Gastric outlet obstruction Secondary to recently diagnosed gastric adenocarcinoma.  Patient was sent to the hospital for placement of a J-tube which was performed on 7/24.  Since placement, patient has not been tolerating advancement of tube feeds secondary to recurrent nausea and vomiting.  Nausea/vomiting Possible ileus versus obstruction Pneumotosis Related to tube feeds.  X-ray imaging was concerning for ileus versus obstruction.  General surgery was consulted and recommended conservative management with treatment for constipation.  Patient with multiple attempts to readvance tube feeds without success and resultant recurrent nausea and vomiting.  KUB concerning for pneumatosis which was confirmed on CT scan. Per general surgery, likely related to J-tube placement. Repeat CT scan on 8/5. 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 -General surgery recommendations: NPO with ice chips, stop PO meds, stop tube feeds, TPN, Cefepime/Flagyl  Gastric adenocarcinoma Patient follows with Dr. Mosetta Putt as an outpatient with possible plan to start immunotherapy.  Primary hypertension Irbesartan discontinued secondary to NPO status and inability to use J-tube. -Hydralazine IV PRN with  parameters  GERD -Continue Protonix  Depression Prozac held secondary to NPO status  Hypokalemia Potassium supplementation as needed  Severe malnutrition Dietitian recommendations (7/31): Monitor magnesium, potassium, and phosphorus for at least 3 days, MD to replete as needed, as pt is at risk for refeeding syndrome. Continue Osmolite 1.5 @ 20 ml/hr via J-tube. Recommend advance by 10 ml every 24 hours to goal rate of 65 ml/hr. Provides at goal: 2340 kcals, 97g protein and 1188 ml H2O.   DVT prophylaxis: Lovenox Code Status:   Code Status: Full Code Family Communication: None at bedside Disposition Plan: Discharge pending ability for patient to tolerate tube feeds and continued general surgery recommendations   Consultants:  General surgery [facility] Medical oncology Palliative care medicine  Procedures:  7/24: EUS  Antimicrobials: Cefepime Flagyl    Subjective: No issues overnight per patient.  Objective: BP (!) 146/97 (BP Location: Right Arm)   Pulse 78   Temp 98.6 F (37 C) (Oral)   Resp 16   Ht 6' (1.829 m)   Wt 82.9 kg   SpO2 98%   BMI 24.79 kg/m   Examination:  General exam: Appears calm and comfortable Respiratory system: Clear to auscultation. Respiratory effort normal. Cardiovascular system: S1 & S2 heard, RRR. No murmurs. Gastrointestinal system: Abdomen is distended, soft and mildly tender. Normal bowel sounds heard. Central nervous system: Alert and oriented. No focal neurological deficits. Musculoskeletal: No edema. No calf tenderness Skin: No cyanosis. No rashes Psychiatry: Judgement and insight appear normal. Mood & affect appropriate.    Data Reviewed: I have personally reviewed following labs and imaging studies  CBC Lab Results  Component Value Date   WBC 9.1 10/29/2022   RBC 4.06 (L) 10/29/2022   HGB 12.1 (L) 10/29/2022   HCT 37.5 (L) 10/29/2022  MCV 92.4 10/29/2022   MCH 29.8 10/29/2022   PLT 224  10/29/2022   MCHC 32.3 10/29/2022   RDW 13.3 10/29/2022   LYMPHSABS 1.1 10/14/2022   MONOABS 0.6 10/14/2022   EOSABS 0.1 10/14/2022   BASOSABS 0.1 10/14/2022     Last metabolic panel Lab Results  Component Value Date   NA 138 10/28/2022   K 3.9 10/28/2022   CL 104 10/28/2022   CO2 27 10/28/2022   BUN 26 (H) 10/28/2022   CREATININE 0.79 10/28/2022   GLUCOSE 153 (H) 10/28/2022   GFRNONAA >60 10/28/2022   GFRAA 73 12/13/2019   CALCIUM 8.0 (L) 10/28/2022   PHOS 3.3 10/28/2022   PROT 5.2 (L) 10/28/2022   ALBUMIN 2.1 (L) 10/28/2022   BILITOT 0.7 10/28/2022   ALKPHOS 149 (H) 10/28/2022   AST 24 10/28/2022   ALT 31 10/28/2022   ANIONGAP 7 10/28/2022    GFR: Estimated Creatinine Clearance: 95.7 mL/min (by C-G formula based on SCr of 0.79 mg/dL).  No results found for this or any previous visit (from the past 240 hour(s)).    Radiology Studies: CT ABDOMEN PELVIS W CONTRAST  Result Date: 10/28/2022 CLINICAL DATA:  Postoperative abdominal pain. EXAM: CT ABDOMEN AND PELVIS WITH CONTRAST TECHNIQUE: Multidetector CT imaging of the abdomen and pelvis was performed using the standard protocol following bolus administration of intravenous contrast. RADIATION DOSE REDUCTION: This exam was performed according to the departmental dose-optimization program which includes automated exposure control, adjustment of the mA and/or kV according to patient size and/or use of iterative reconstruction technique. CONTRAST:  OMNIPAQUE IOHEXOL 300 MG/ML SOLN, OMNIPAQUE IOHEXOL 300 MG/ML SOLN COMPARISON:  October 23, 2022. FINDINGS: Lower chest: Mild bibasilar subsegmental atelectasis. Gas is now seen in the anterior mediastinal region. Hepatobiliary: Stable gallbladder distention. No biliary dilatation is noted. No cholelithiasis is noted. Stable hepatic cysts. Pancreas: Unremarkable. No pancreatic ductal dilatation or surrounding inflammatory changes. Spleen: Normal in size without focal  abnormality. Adrenals/Urinary Tract: Adrenal glands are unremarkable. Bilateral renal cysts are noted for which no further follow-up is required. No hydronephrosis or renal obstruction. Urinary bladder is unremarkable. Stomach/Bowel: Continued gastric wall thickening and body and antrum consistent with history of gastric carcinoma. Jejunostomy tube is again noted. Mildly dilated small bowel loops are noted in the right lower quadrant with mild wall thickening, with this appears to be overall improved compared to prior exam. No colonic dilatation is noted. The pneumatosis noted on prior exam is nearly resolved, although there remains a large amount of free air both adjacent to the jejunal loops as well as in the epigastric region, which potentially is postsurgical in etiology. Status post appendectomy. There is no definite evidence of leakage of contrast. Vascular/Lymphatic: No significant vascular abnormality is noted. 8 mm gastrohepatic lymph node is noted suggesting metastatic disease. Reproductive: Stable mild prostatic enlargement. Other: Mild ascites is noted. No hernia is noted. Free air is noted in the anterior abdominal wall which is increased compared to prior exam. Musculoskeletal: No acute or significant osseous findings. IMPRESSION: Continued gastric wall thickening of body and antrum consistent with history of gastric carcinoma. 8 mm gastrohepatic lymph node is also noted suggesting metastatic disease. Continued presence of jejunostomy tube. Pneumatosis involving jejunum on prior exam is nearly resolved, and there is significantly decreased small bowel dilatation compared to prior exam as well. However, there remains a large amount of free air adjacent to small bowel loops and in the mesentery as well as in the epigastric region. An increased  amount of air is seen in the subcutaneous tissues of the anterior abdominal wall of the right lower quadrant as well as air seen extending into the anterior  mediastinum in the visualized chest. Mild ascites is noted. While this free air may be postsurgical in etiology, the possibility of bowel perforation still cannot be excluded. However, there is no definite evidence of extravasated contrast on this exam. These results will be called to the ordering clinician or representative by the Radiologist Assistant, and communication documented in the PACS or zVision Dashboard. Electronically Signed   By: Lupita Raider M.D.   On: 10/28/2022 10:43      LOS: 14 days    Jacquelin Hawking, MD [facility] 10/29/2022, 10:09 AM   If 7PM-7AM, please contact night-coverage [email or web address]

## 2022-10-29 NOTE — Progress Notes (Addendum)
13 Days Post-Op   Subjective/Chief Complaint: No acute changes.  Ongoing nausea, worse at night. No significant abdominal pain.  Expressed interest in a family meeting later this week. Expressed interest in more details about cancer treatment as well as palliative care/what to expect end of life.   Objective: Vital signs in last 24 hours: Temp:  [97.8 F (36.6 C)-98.6 F (37 C)] 98.6 F (37 C) (08/06 0437) Pulse Rate:  [78-92] 78 (08/06 0437) Resp:  [15-18] 16 (08/06 0437) BP: (137-146)/(95-98) 146/97 (08/06 0437) SpO2:  [98 %] 98 % (08/06 0437) Last BM Date : 10/26/22  Intake/Output from previous day: 08/05 0701 - 08/06 0700 In: 4449.2 [P.O.:240; I.V.:3325; NG/GT:683.3; IV Piggyback:200.9] Out: 0  Intake/Output this shift: No intake/output data recorded.  Incision is intact.  There is bilious drainage/succus from the inferior portion of the incision.  J-tube without leakage around it today- balloon deflated (2.5 mL saline removed from balloon).  No cellulitis - there is skin irration where sutures are.   Lab Results:  Recent Labs    10/29/22 0617  WBC 9.1  HGB 12.1*  HCT 37.5*  PLT 224   BMET Recent Labs    10/27/22 0901 10/28/22 0549  NA 141 138  K 4.4 3.9  CL 109 104  CO2 25 27  GLUCOSE 142* 153*  BUN 26* 26*  CREATININE 0.72 0.79  CALCIUM 7.9* 8.0*   PT/INR No results for input(s): "LABPROT", "INR" in the last 72 hours. ABG No results for input(s): "PHART", "HCO3" in the last 72 hours.  Invalid input(s): "PCO2", "PO2"  Studies/Results: CT ABDOMEN PELVIS W CONTRAST  Result Date: 10/28/2022 CLINICAL DATA:  Postoperative abdominal pain. EXAM: CT ABDOMEN AND PELVIS WITH CONTRAST TECHNIQUE: Multidetector CT imaging of the abdomen and pelvis was performed using the standard protocol following bolus administration of intravenous contrast. RADIATION DOSE REDUCTION: This exam was performed according to the departmental dose-optimization program which includes  automated exposure control, adjustment of the mA and/or kV according to patient size and/or use of iterative reconstruction technique. CONTRAST:  OMNIPAQUE IOHEXOL 300 MG/ML SOLN, OMNIPAQUE IOHEXOL 300 MG/ML SOLN COMPARISON:  October 23, 2022. FINDINGS: Lower chest: Mild bibasilar subsegmental atelectasis. Gas is now seen in the anterior mediastinal region. Hepatobiliary: Stable gallbladder distention. No biliary dilatation is noted. No cholelithiasis is noted. Stable hepatic cysts. Pancreas: Unremarkable. No pancreatic ductal dilatation or surrounding inflammatory changes. Spleen: Normal in size without focal abnormality. Adrenals/Urinary Tract: Adrenal glands are unremarkable. Bilateral renal cysts are noted for which no further follow-up is required. No hydronephrosis or renal obstruction. Urinary bladder is unremarkable. Stomach/Bowel: Continued gastric wall thickening and body and antrum consistent with history of gastric carcinoma. Jejunostomy tube is again noted. Mildly dilated small bowel loops are noted in the right lower quadrant with mild wall thickening, with this appears to be overall improved compared to prior exam. No colonic dilatation is noted. The pneumatosis noted on prior exam is nearly resolved, although there remains a large amount of free air both adjacent to the jejunal loops as well as in the epigastric region, which potentially is postsurgical in etiology. Status post appendectomy. There is no definite evidence of leakage of contrast. Vascular/Lymphatic: No significant vascular abnormality is noted. 8 mm gastrohepatic lymph node is noted suggesting metastatic disease. Reproductive: Stable mild prostatic enlargement. Other: Mild ascites is noted. No hernia is noted. Free air is noted in the anterior abdominal wall which is increased compared to prior exam. Musculoskeletal: No acute or significant osseous  findings. IMPRESSION: Continued gastric wall thickening of body and antrum  consistent with history of gastric carcinoma. 8 mm gastrohepatic lymph node is also noted suggesting metastatic disease. Continued presence of jejunostomy tube. Pneumatosis involving jejunum on prior exam is nearly resolved, and there is significantly decreased small bowel dilatation compared to prior exam as well. However, there remains a large amount of free air adjacent to small bowel loops and in the mesentery as well as in the epigastric region. An increased amount of air is seen in the subcutaneous tissues of the anterior abdominal wall of the right lower quadrant as well as air seen extending into the anterior mediastinum in the visualized chest. Mild ascites is noted. While this free air may be postsurgical in etiology, the possibility of bowel perforation still cannot be excluded. However, there is no definite evidence of extravasated contrast on this exam. These results will be called to the ordering clinician or representative by the Radiologist Assistant, and communication documented in the PACS or zVision Dashboard. Electronically Signed   By: Lupita Raider M.D.   On: 10/28/2022 10:43    Anti-infectives: Anti-infectives (From admission, onward)    Start     Dose/Rate Route Frequency Ordered Stop   10/24/22 0900  ceFEPIme (MAXIPIME) 2 g in sodium chloride 0.9 % 100 mL IVPB        2 g 200 mL/hr over 30 Minutes Intravenous Every 12 hours 10/24/22 0805     10/24/22 0900  metroNIDAZOLE (FLAGYL) IVPB 500 mg        500 mg 100 mL/hr over 60 Minutes Intravenous Every 12 hours 10/24/22 0805     10/16/22 0730  cefoTEtan (CEFOTAN) 2 g in sodium chloride 0.9 % 100 mL IVPB        2 g 200 mL/hr over 30 Minutes Intravenous On call to O.R. 10/15/22 1051 10/16/22 1845       Assessment/Plan: s/p Procedure(s): DIAGNOSTIC LAPAROSCOPY, OPEN J TUBE PLACEMENT (N/A) INSERTION PORT-A-CATH (N/A) Bilious drainage noted from his wound starting on Saturday. CT scan repeated yesterday with IV and PO contrast  (per  J tube). Interval improvement in small bowel pneumatosis and small bowel dilation. Increased amount of free are in the subcutaneous tissues of the abdominal wall and persistent free air in the abdomen.no extravasation of contrast.  - patient is hemodynamically stable, non-toxic appearing, no peritonitis. 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 I will ask RN to pouch this area in order to better quantify drainage. Based in CT scan results I cannot pinpoint a suspected area of the GI tract that is leaking/perforated. I deflated his J tube balloon to r/o any obstruction at the level of this balloon that's could be contributing to the abdominal wall drainage. There is no emergent need to for surgery today. I reviewed his case with Dr. Donell Beers who agrees with this plan. Would recommend continued observation of this wound/fistula in the hosptial. Continue TPN. I will ask Dr. Mosetta Putt to weigh-in on how this fistula might affect her plans for starting immunotherapy.   Continue NPO, TPN Continue IV abx Do not use the J-tube      LOS: 14 days    Adam Phenix 10/29/2022

## 2022-10-29 NOTE — Progress Notes (Signed)
PHARMACY - TOTAL PARENTERAL NUTRITION CONSULT NOTE   Indication:  Gastric Outlet Obstruction  Patient Measurements: Height: 6' (182.9 cm) Weight: 82.9 kg (182 lb 12.2 oz) IBW/kg (Calculated) : 77.6 TPN AdjBW (KG): 72.3 Body mass index is 24.79 kg/m. Usual Weight: 72kg  Assessment:  67 YO male with metastatic gastric cancer and outlet obstruction. Patient had J-tube placement 7/24 due to poor oral intake secondary to gastric outlet obstruction. He has been unable to advance tube feeds since J-tube placement. KUB from 7/31 showed air-fluid levels of the small bowel and CT scan showed pneumatosis in the area around the J-tube. TPN to start in the setting of gastric outlet obstruction and inability to advance enteral feeding.  Glucose / Insulin: No DM hx CBGs at goal (< 180) last 24hr (6 units SSI used) Electrolytes: All, including Corrected Calcium, WNL (8/5) Renal: SCr <1, stable (8/5) Hepatic:  AST/ALT, Tbili WNL. Alk phos 149, slightly elevated (8/5)  Albumin low  TGs 148 (8/5) Intake / Output; MIVF:  Urine x 1 documented - per RN, UOP likely not accurately documented (pt takes self to bathroom) Intake: 4492 mL GI Imaging: - 7/19 CT a/p: Gastric wall thickening, consistent with known gastric cancer. Perigastric fat stranding, concerning for local invasion. New prominent subcentimeter gastrohepatic ligament and left para-aortic lymph nodes, concerning for nodal metastatic disease. - 7/31 CT a/p: Interval placement of jejunostomy catheter into the proximal jejunum. Pneumatosis of proximal jejunum beginning just distal to the jejunostomy catheter, extending into the mesentery. Mid and distal small bowel loops are distended without wall thickening or pneumatosis. Small amount of free intraperitoneal gas and scattered abdominal and pelvic ascites, new since previous, potentially related to recent surgery. Persistent gastric wall thickening in the body and antrum with gastrohepatic ligament  adenopathy as previously noted, consistent with history of gastric carcinoma. Patchy atelectasis or infiltrates posteriorly in both lung bases, new since previous. - 8/5 CT a/p:  Interval improvement in small bowel pneumatosis and small bowel dilatation. An increased amount of air is seen in the subcutaneous tissues of the anterior abdominal wall of the right lower quadrant as well as air seen extending into the anterior mediastinum in the visualized chest. No extravasation of contrast.  GI Surgeries / Procedures: - 7/24: diagnostic laparoscopy, open J tube placement, port-a-cath insertion, upper EUS  Central access: port-a-cath placed 7/24 TPN start date: 8/1  Nutritional Goals: Goal TPN rate is 90 mL/hr (provides 110 g of protein and 2219 kcals per day)  RD Assessment: Estimated Needs Total Energy Estimated Needs: 2150-2350 Total Protein Estimated Needs: 105-120g Total Fluid Estimated Needs: 2.1L/day RD also recommended thiamine 100mg  IV daily x 5 days due to risk for refeeding syndrome   Current Nutrition:  NPO except for ice chips TPN  Plan:  At 1800: - Continue TPN at goal rate of 90 mL/hr - Electrolytes in TPN:  Na 7mEq/L K 14mEq/L Ca 81mEq/L Mg 67mEq/L Phos 36mmol/L Cl:Ac 1:1 - Add standard MVI and trace elements to TPN - Continue Sensitive q8h SSI and adjust as needed  - 5 days Thiamine completed - Monitor TPN labs on Mon/Thurs and as needed - CMET, Mg, Phos in AM    Greer Pickerel, PharmD, BCPS Clinical Pharmacist 10/29/2022 11:39 AM

## 2022-10-30 ENCOUNTER — Inpatient Hospital Stay: Payer: Medicare PPO

## 2022-10-30 ENCOUNTER — Inpatient Hospital Stay: Payer: Medicare PPO | Admitting: Nurse Practitioner

## 2022-10-30 DIAGNOSIS — K311 Adult hypertrophic pyloric stenosis: Secondary | ICD-10-CM | POA: Diagnosis not present

## 2022-10-30 LAB — GLUCOSE, CAPILLARY
Glucose-Capillary: 154 mg/dL — ABNORMAL HIGH (ref 70–99)
Glucose-Capillary: 171 mg/dL — ABNORMAL HIGH (ref 70–99)
Glucose-Capillary: 165 mg/dL — ABNORMAL HIGH (ref 70–99)
Glucose-Capillary: 148 mg/dL — ABNORMAL HIGH (ref 70–99)

## 2022-10-30 MED ORDER — TRAVASOL 10 % IV SOLN
INTRAVENOUS | Status: AC
  Filled 2022-10-30: qty 1020

## 2022-10-30 MED ORDER — INSULIN ASPART 100 UNIT/ML IJ SOLN
0.0000 [IU] | INTRAMUSCULAR | Status: DC
  Administered 2022-10-30: 1 [IU] via SUBCUTANEOUS
  Administered 2022-10-30 – 2022-10-31 (×2): 2 [IU] via SUBCUTANEOUS

## 2022-10-30 NOTE — Progress Notes (Signed)
PHARMACY - TOTAL PARENTERAL NUTRITION CONSULT NOTE   Indication:  Gastric Outlet Obstruction  Patient Measurements: Height: 6' (182.9 cm) Weight: 83.1 kg (183 lb 3.2 oz) IBW/kg (Calculated) : 77.6 TPN AdjBW (KG): 72.3 Body mass index is 24.85 kg/m. Usual Weight: 72kg  Assessment:  67 YO male with metastatic gastric cancer and outlet obstruction. Patient had J-tube placement 7/24 due to poor oral intake secondary to gastric outlet obstruction. He has been unable to advance tube feeds since J-tube placement. KUB from 7/31 showed air-fluid levels of the small bowel and CT scan showed pneumatosis in the area around the J-tube. TPN to start in the setting of gastric outlet obstruction and inability to advance enteral feeding.  Glucose / Insulin: No DM hx CBGs at goal (< 180) last 24hr (6 units SSI used) Electrolytes: Al WNL Renal: SCr <1, stable (8/5) Hepatic:  AST/ALT, Tbili WNL. Alk phos 149, slightly elevated (8/5)  Albumin low  TGs 148 (8/5) Intake / Output; MIVF:  Urine x 1 documented - per RN, UOP likely not accurately documented (pt takes self to bathroom) Intake: 4492 mL GI Imaging: - 7/19 CT a/p: Gastric wall thickening, consistent with known gastric cancer. Perigastric fat stranding, concerning for local invasion. New prominent subcentimeter gastrohepatic ligament and left para-aortic lymph nodes, concerning for nodal metastatic disease. - 7/31 CT a/p: Interval placement of jejunostomy catheter into the proximal jejunum. Pneumatosis of proximal jejunum beginning just distal to the jejunostomy catheter, extending into the mesentery. Mid and distal small bowel loops are distended without wall thickening or pneumatosis. Small amount of free intraperitoneal gas and scattered abdominal and pelvic ascites, new since previous, potentially related to recent surgery. Persistent gastric wall thickening in the body and antrum with gastrohepatic ligament adenopathy as previously noted,  consistent with history of gastric carcinoma. Patchy atelectasis or infiltrates posteriorly in both lung bases, new since previous. - 8/5 CT a/p:  Interval improvement in small bowel pneumatosis and small bowel dilatation. An increased amount of air is seen in the subcutaneous tissues of the anterior abdominal wall of the right lower quadrant as well as air seen extending into the anterior mediastinum in the visualized chest. No extravasation of contrast.  GI Surgeries / Procedures: - 7/24: diagnostic laparoscopy, open J tube placement, port-a-cath insertion, upper EUS  Central access: port-a-cath placed 7/24 TPN start date: 8/1  Nutritional Goals: Goal TPN rate is 90 mL/hr (provides 110 g of protein and 2219 kcals per day) 8/7: transition to cyclic TPN with 12 hr goal TPN run time - goals 110g protein, reduced goal Kcal of 1824 due to limited dextrose amount due to exceeded glucose infusion rate if dextrose stays at current amount  RD Assessment: Estimated Needs Total Energy Estimated Needs: 2150-2350 Total Protein Estimated Needs: 105-120g Total Fluid Estimated Needs: 2.1L/day RD also recommended thiamine 100mg  IV daily x 5 days due to risk for refeeding syndrome   Current Nutrition:  NPO except for ice chips TPN  Plan:  At 1800: - Will start cyclic TPN over 18 hours - rates of 59 ml/hr x 1 hr, 118 ml/hr x 16hr, 59 ml/hr, then off - Electrolytes in TPN:  Na 57mEq/L K 40mEq/L Ca 73mEq/L Mg 21mEq/L Phos 44mmol/L Cl:Ac 1:1 - Add standard MVI and trace elements to TPN - Change SSI/CBGs checks due to start of cyclic TPN - will do time of 2000, 0200, 1000 while TPN runs and 1 CBG at 1400 while TPN is off - 5 days Thiamine completed - Monitor TPN  labs on Mon/Thurs and as needed - CMET, Mg, Phos in AM   Hessie Knows, PharmD, BCPS Secure Chat if ?s 10/30/2022 10:38 AM

## 2022-10-30 NOTE — Plan of Care (Signed)
Problem: Education: Goal: Knowledge of General Education information will improve Description: Including pain rating scale, medication(s)/side effects and non-pharmacologic comfort measures 10/30/2022 0541 by Orvil Feil, RN Outcome: Adequate for Discharge 10/30/2022 0541 by Orvil Feil, RN Outcome: Progressing   Problem: Health Behavior/Discharge Planning: Goal: Ability to manage health-related needs will improve 10/30/2022 0541 by Orvil Feil, RN Outcome: Adequate for Discharge 10/30/2022 0541 by Orvil Feil, RN Outcome: Progressing   Problem: Clinical Measurements: Goal: Ability to maintain clinical measurements within normal limits will improve 10/30/2022 0541 by Orvil Feil, RN Outcome: Adequate for Discharge 10/30/2022 0541 by Orvil Feil, RN Outcome: Progressing Goal: Will remain free from infection 10/30/2022 0541 by Orvil Feil, RN Outcome: Adequate for Discharge 10/30/2022 0541 by Orvil Feil, RN Outcome: Progressing Goal: Diagnostic test results will improve 10/30/2022 0541 by Orvil Feil, RN Outcome: Adequate for Discharge 10/30/2022 0541 by Orvil Feil, RN Outcome: Progressing Goal: Respiratory complications will improve 10/30/2022 0541 by Orvil Feil, RN Outcome: Adequate for Discharge 10/30/2022 0541 by Orvil Feil, RN Outcome: Progressing Goal: Cardiovascular complication will be avoided 10/30/2022 0541 by Orvil Feil, RN Outcome: Adequate for Discharge 10/30/2022 0541 by Orvil Feil, RN Outcome: Progressing   Problem: Activity: Goal: Risk for activity intolerance will decrease 10/30/2022 0541 by Orvil Feil, RN Outcome: Adequate for Discharge 10/30/2022 0541 by Orvil Feil, RN Outcome: Progressing   Problem: Nutrition: Goal: Adequate nutrition will be maintained 10/30/2022 0541 by Orvil Feil, RN Outcome: Adequate for Discharge 10/30/2022 0541 by Orvil Feil, RN Outcome:  Progressing   Problem: Coping: Goal: Level of anxiety will decrease 10/30/2022 0541 by Orvil Feil, RN Outcome: Adequate for Discharge 10/30/2022 0541 by Orvil Feil, RN Outcome: Progressing   Problem: Elimination: Goal: Will not experience complications related to bowel motility 10/30/2022 0541 by Orvil Feil, RN Outcome: Adequate for Discharge 10/30/2022 0541 by Orvil Feil, RN Outcome: Progressing Goal: Will not experience complications related to urinary retention 10/30/2022 0541 by Orvil Feil, RN Outcome: Adequate for Discharge 10/30/2022 0541 by Orvil Feil, RN Outcome: Progressing   Problem: Pain Managment: Goal: General experience of comfort will improve 10/30/2022 0541 by Orvil Feil, RN Outcome: Adequate for Discharge 10/30/2022 0541 by Orvil Feil, RN Outcome: Progressing   Problem: Safety: Goal: Ability to remain free from injury will improve 10/30/2022 0541 by Orvil Feil, RN Outcome: Adequate for Discharge 10/30/2022 0541 by Orvil Feil, RN Outcome: Progressing   Problem: Skin Integrity: Goal: Risk for impaired skin integrity will decrease 10/30/2022 0541 by Orvil Feil, RN Outcome: Adequate for Discharge 10/30/2022 0541 by Orvil Feil, RN Outcome: Progressing   Problem: Education: Goal: Ability to describe self-care measures that may prevent or decrease complications (Diabetes Survival Skills Education) will improve 10/30/2022 0541 by Orvil Feil, RN Outcome: Adequate for Discharge 10/30/2022 0541 by Orvil Feil, RN Outcome: Progressing Goal: Individualized Educational Video(s) 10/30/2022 0541 by Orvil Feil, RN Outcome: Adequate for Discharge 10/30/2022 0541 by Orvil Feil, RN Outcome: Progressing   Problem: Coping: Goal: Ability to adjust to condition or change in health will improve 10/30/2022 0541 by Orvil Feil, RN Outcome: Adequate for Discharge 10/30/2022 0541 by  Orvil Feil, RN Outcome: Progressing   Problem: Fluid Volume: Goal: Ability to maintain a balanced intake and output will improve 10/30/2022 0541 by Orvil Feil, RN Outcome: Adequate for Discharge 10/30/2022 0541 by Roxan Hockey,  Truett Mainland, RN Outcome: Progressing   Problem: Health Behavior/Discharge Planning: Goal: Ability to identify and utilize available resources and services will improve 10/30/2022 0541 by Orvil Feil, RN Outcome: Adequate for Discharge 10/30/2022 0541 by Orvil Feil, RN Outcome: Progressing Goal: Ability to manage health-related needs will improve 10/30/2022 0541 by Orvil Feil, RN Outcome: Adequate for Discharge 10/30/2022 0541 by Orvil Feil, RN Outcome: Progressing   Problem: Metabolic: Goal: Ability to maintain appropriate glucose levels will improve 10/30/2022 0541 by Orvil Feil, RN Outcome: Adequate for Discharge 10/30/2022 0541 by Orvil Feil, RN Outcome: Progressing   Problem: Nutritional: Goal: Maintenance of adequate nutrition will improve 10/30/2022 0541 by Orvil Feil, RN Outcome: Adequate for Discharge 10/30/2022 0541 by Orvil Feil, RN Outcome: Progressing Goal: Progress toward achieving an optimal weight will improve 10/30/2022 0541 by Orvil Feil, RN Outcome: Adequate for Discharge 10/30/2022 0541 by Orvil Feil, RN Outcome: Progressing   Problem: Skin Integrity: Goal: Risk for impaired skin integrity will decrease 10/30/2022 0541 by Orvil Feil, RN Outcome: Adequate for Discharge 10/30/2022 0541 by Orvil Feil, RN Outcome: Progressing   Problem: Tissue Perfusion: Goal: Adequacy of tissue perfusion will improve 10/30/2022 0541 by Orvil Feil, RN Outcome: Adequate for Discharge 10/30/2022 0541 by Orvil Feil, RN Outcome: Progressing

## 2022-10-30 NOTE — Progress Notes (Signed)
JUSITN FEI   DOB:08/22/1955   EV#:[id number]   WEX#:[id number]  Medical oncology follow-up note.  Subjective: Patient is clinically stable, her pain and nausea are overall controlled.  She is n.p.o. now, and tube feeding has been held due to his EC fistula.  The ostomy covering the fistula has about 40 to 50 cc green liquid, last was amputated in the morning.  He is able to ambulate in the hallway, wife and 2 other family members are at bedside when I saw him.   Objective:  Vitals:   10/30/22 0623 10/30/22 1445  BP: (!) 113/55 (!) 125/90  Pulse: 72 (!) 109  Resp: 16 17  Temp: 98.9 F (37.2 C) (!) 97.5 F (36.4 C)  SpO2: 93% 97%    Body mass index is 24.85 kg/m.  Intake/Output Summary (Last 24 hours) at 10/30/2022 1713 Last data filed at 10/29/2022 1854 Gross per 24 hour  Intake 1309.78 ml  Output --  Net 1309.78 ml     Sclerae unicteric Abdomen soft, (+) ostomy bag covering the fistula in the right mid abdomen, feeding tube in the left side abdomen, no significant tenderness.  MSK no focal spinal tenderness, no peripheral edema  Neuro nonfocal    CBG (last 3)  Recent Labs    10/30/22 0144 10/30/22 0608 10/30/22 1437  GLUCAP 154* 171* 165*     Labs:   Urine Studies No results for input(s): "UHGB", "CRYS" in the last 72 hours.  Invalid input(s): "UACOL", "UAPR", "USPG", "UPH", "UTP", "UGL", "UKET", "UBIL", "UNIT", "UROB", "ULEU", "UEPI", "UWBC", "URBC", "UBAC", "CAST", "UCOM", "BILUA"  Basic Metabolic Panel: Recent Labs  Lab 10/25/22 0500 10/26/22 0553 10/27/22 0901 10/28/22 0549 10/30/22 0514  NA 144 142 141 138 135  K 3.0* 3.4* 4.4 3.9 4.2  CL 108 110 109 104 103  CO2 29 27 25 27 26   GLUCOSE 158* 166* 142* 153* 169*  BUN 30* 26* 26* 26* 24*  CREATININE 0.75 0.68 0.72 0.79 0.62  CALCIUM 7.9* 7.9* 7.9* 8.0* 8.1*  MG 2.1 2.0 2.2 2.1 2.1  PHOS 2.4* 3.3 2.9 3.3 3.1   GFR Estimated Creatinine Clearance: 95.7 mL/min (by C-G formula based on SCr of 0.62  mg/dL). Liver Function Tests: Recent Labs  Lab 10/25/22 0500 10/26/22 0553 10/27/22 0901 10/28/22 0549 10/30/22 0514  AST 18 19 27 24 17   ALT 23 23 28 31 21   ALKPHOS 70 76 99 149* 145*  BILITOT 1.3* 1.0 1.0 0.7 0.6  PROT 4.7* 4.3* 4.5* 5.2* 5.1*  ALBUMIN 1.9* 1.8* 1.8* 2.1* 2.2*   No results for input(s): "LIPASE", "AMYLASE" in the last 168 hours. No results for input(s): "AMMONIA" in the last 168 hours. Coagulation profile No results for input(s): "INR", "PROTIME" in the last 168 hours.  CBC: Recent Labs  Lab 10/26/22 0553 10/29/22 0617  WBC 8.6 9.1  HGB 11.2* 12.1*  HCT 35.1* 37.5*  MCV 93.6 92.4  PLT 127* 224   Cardiac Enzymes: No results for input(s): "CKTOTAL", "CKMB", "CKMBINDEX", "TROPONINI" in the last 168 hours. BNP: Invalid input(s): "POCBNP" CBG: Recent Labs  Lab 10/29/22 1330 10/29/22 2210 10/30/22 0144 10/30/22 0608 10/30/22 1437  GLUCAP 146* 163* 154* 171* 165*   D-Dimer No results for input(s): "DDIMER" in the last 72 hours. Hgb A1c No results for input(s): "HGBA1C" in the last 72 hours. Lipid Profile Recent Labs    10/28/22 0549  TRIG 148   Thyroid function studies No results for input(s): "TSH", "T4TOTAL", "T3FREE", "THYROIDAB" in the last  72 hours.  Invalid input(s): "FREET3" Anemia work up No results for input(s): "VITAMINB12", "FOLATE", "FERRITIN", "TIBC", "IRON", "RETICCTPCT" in the last 72 hours. Microbiology No results found for this or any previous visit (from the past 240 hour(s)).    Studies:  No results found.  Assessment: 67 y.o. 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 -Communicated with general surgery team, we will  hold his tube feeds and keep him n.p.o. for now -will monitor his output from his fistular -Patient has a question if he can start immunotherapy in the hospital.  Unfortunately not able to.  I do plan to start him as soon as he is discharged after hospital.  Immunotherapy would not impact his healing of the fistula. -I encouraged him to participate physical therapy, he is interested, I will place a PT evaluation for him.  He is able to ambulate in the hallway. -I will f/u as needed  [name], MD 10/30/2022

## 2022-10-30 NOTE — Progress Notes (Signed)
Daily Progress Note   Patient Name: Steven Wolf       Date: 10/30/2022 DOB: 1955-08-07  Age: 67 y.o. MRN#: [id number] Attending Physician: Briant Cedar, MD Primary Care Physician: Pincus Sanes, MD Admit Date: 10/14/2022 Length of Stay: 15 days  Reason for Consultation/Follow-up: Establishing goals of care and symptom management  Subjective:   CC: Patient continues to receive TPN  Following up regarding complex medical decision making and symptom management.   Subjective:  Reviewed EMR prior to presenting to bedside.  Urgent care team continues to follow.  Patient complains of having increased secretions and some sensation of nausea.  Patient's wife is at bedside.  Brother and sister-in-law are also at bedside. Patient and family have questions for medical oncology with regards to how soon he can start immunotherapy.   All questions answered at this time to the best of my ability.    Review of Systems Minimal vomiting  Objective:   Vital Signs:  BP (!) 113/55 (BP Location: Right Arm)   Pulse 72   Temp 98.9 F (37.2 C) (Oral)   Resp 16   Ht 6' (1.829 m)   Wt 83.1 kg   SpO2 93%   BMI 24.85 kg/m   Physical Exam: General: NAD, alert, laying in bed, pleasant , chronically ill appearing Eyes: No drainage noted HENT: moist mucous membranes Cardiovascular: RRR Respiratory: no increased work of breathing noted, not in respiratory distress Abdomen: distended Neuro: A&Ox4, following commands easily Psych: appropriately answers all questions  Imaging:  I personally reviewed recent imaging.   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 Since admission, patient has received continued workup through oncology, GI, and surgery. Patient underwent J-tube placement for management of tube feeds in setting of gastric obstruction. Plan is for patient to start palliative immunotherapy after hospitalization. Palliative medicine team consulted to assist with complex medical decision making and symptom management.   Recommendations/Plan: # Complex medical decision making/goals of care:      -Continues open discussions with his care team regarding pathways for medical care moving forward.  Currently plan is for patient to receive palliative immunotherapy in the outpatient setting.                -  Code Status: Full Code                                    # Symptom management                -Pain, abdominal in the setting of metastatic gastric cancer with peritoneal mets and gastric outlet obstruction                                                               -Continue IV Dilaudid to 0.5-1 mg every 2 hours as needed for pain                  -  Nausea/vomiting, in setting of metastatic gastric cancer with peritoneal mets and gastric outlet obstruction                               -Continue Zofran 4 mg every 6 hours as needed                               -Continue Compazine 5 mg every 6 hours as needed                               -EKG on 7/30 showed QTc of 452.                   -Constipation     Continue bowel regimen                               -Encourage ambulation as able    -Anxiety     -Continue IV Ativan 0.5mg  q4hrs prn   # Psycho-social/Spiritual Support:  - Support System: wife, sister-in-law   # Discharge Planning:  To Be Determined                -If continues current medical pathway, will need outpatient palliative medicine follow referral to [facility]. Informed patient of this.  Did not place referral at this time as awaiting further medical outcomes.  Informed patient and  family today about my colleague Ms. Cousar, NP who is in palliative medicine at [facility].  Discussed with: patient, patient's wife   Thank you for allowing the palliative care team to participate in the care AALIM STOFFERS.  Mod MDM Rosalin Hawking MD.  Palliative Care Provider PMT # [phone]  If patient remains symptomatic despite maximum doses, please call PMT at [phone] between 0700 and 1900. Outside of these hours, please call attending, as PMT does not have night coverage.  *Please note that this is a verbal dictation therefore any spelling or grammatical errors are due to the "[employer]" system interpretation.

## 2022-10-30 NOTE — Consult Note (Addendum)
WOC Nurse wound follow up Pouch is intact with good seal to abd over fistula site with mod amt green drainage. Instructions have been provided for the bedside nurses to change PRN if leaking. 4 sets of additional pouches at the bedside, along with pattern to cut. This procedure could be performed after discharge by home health or hospice nurses, if that plan of care is decided.  Please re-consult if further assistance is needed.  Thank-you,  Cammie Mcgee MSN, RN, CWOCN, [place], CNS [phone]

## 2022-10-30 NOTE — Progress Notes (Signed)
PROGRESS NOTE    Steven Wolf  ZOX:[id number] DOB: 01/10/1956 DOA: 10/14/2022 PCP: Pincus Sanes, MD   Brief Narrative: Steven Wolf is a 67 y.o. male with a history of gastric adenocarcinoma, hypertension, depression. Patient presented secondary to need for J-tube placement for poor oral intake secondary to gastric outlet obstruction. Post J-tube placement, patient with inability to advance tube feeds. General surgery consulted for concern of pnematosis. Patient continued to fail advancement of tube feed rate. Tube feeds held and TPN started on 8/1. Antibiotics started.    Assessment and Plan:  Gastric outlet obstruction Secondary to recently diagnosed gastric adenocarcinoma J-tube placed on 7/24, currently not tolerating advancement of tube feeds secondary to recurrent nausea and vomiting Continue TPN  Nausea/vomiting Pneumatosis EC Fistula General surgery consulted, pneumatosis likely related to J-tube placement Repeat CT scan on 8/5 showed drainage from incision site consistent with fistula formation General surgery recommendations: NPO, stop PO meds and tube feeds, continue TPN Continue Cefepime/Flagyl  Gastric adenocarcinoma Patient follows with Dr. Mosetta Putt as an outpatient with possible plan to start immunotherapy Dr Mosetta Putt will see patient  Primary hypertension Irbesartan discontinued secondary to NPO status and inability to use J-tube Hydralazine IV PRN with parameters  GERD Continue Protonix  Depression Prozac held secondary to NPO status  Severe malnutrition TPN Dietician on board    DVT prophylaxis: Lovenox Code Status:   Code Status: Full Code Family Communication: None at bedside Disposition Plan: Discharge pending further recs as per gen surg and oncology     Consultants:  General surgery [place] [facility] Medical oncology Palliative care medicine  Procedures:  7/24: EUS  Antimicrobials: Cefepime Flagyl     Subjective: Still with nausea/vomiting, but states not worsening  Objective: BP (!) 113/55 (BP Location: Right Arm)   Pulse 72   Temp 98.9 F (37.2 C) (Oral)   Resp 16   Ht 6' (1.829 m)   Wt 83.1 kg   SpO2 93%   BMI 24.85 kg/m   Examination: General: NAD  Cardiovascular: S1, S2 present Respiratory: CTAB Abdomen: Soft, nontender, +distended, bowel sounds present Musculoskeletal: No bilateral pedal edema noted Skin: Normal Psychiatry: Normal mood     Data Reviewed: I have personally reviewed following labs and imaging studies  CBC Lab Results  Component Value Date   WBC 9.1 10/29/2022   RBC 4.06 (L) 10/29/2022   HGB 12.1 (L) 10/29/2022   HCT 37.5 (L) 10/29/2022   MCV 92.4 10/29/2022   MCH 29.8 10/29/2022   PLT 224 10/29/2022   MCHC 32.3 10/29/2022   RDW 13.3 10/29/2022   LYMPHSABS 1.1 10/14/2022   MONOABS 0.6 10/14/2022   EOSABS 0.1 10/14/2022   BASOSABS 0.1 10/14/2022     Last metabolic panel Lab Results  Component Value Date   NA 135 10/30/2022   K 4.2 10/30/2022   CL 103 10/30/2022   CO2 26 10/30/2022   BUN 24 (H) 10/30/2022   CREATININE 0.62 10/30/2022   GLUCOSE 169 (H) 10/30/2022   GFRNONAA >60 10/30/2022   GFRAA 73 12/13/2019   CALCIUM 8.1 (L) 10/30/2022   PHOS 3.1 10/30/2022   PROT 5.1 (L) 10/30/2022   ALBUMIN 2.2 (L) 10/30/2022   BILITOT 0.6 10/30/2022   ALKPHOS 145 (H) 10/30/2022   AST 17 10/30/2022   ALT 21 10/30/2022   ANIONGAP 6 10/30/2022    GFR: Estimated Creatinine Clearance: 95.7 mL/min (by C-G formula based on SCr of 0.62 mg/dL).  No results found for this or any previous visit (  from the past 240 hour(s)).    Radiology Studies: No results found.    LOS: 15 days    Briant Cedar, MD [facility] 10/30/2022, 2:41 PM   If 7PM-7AM, please contact night-coverage [email or web address]

## 2022-10-30 NOTE — Progress Notes (Deleted)
   10/30/22 1455  TOC Brief Assessment  Insurance and Status Reviewed  Patient has primary care physician Yes  Home environment has been reviewed yes  Prior level of function: independent  Prior/Current Home Services No current home services  Social Determinants of Health Reivew SDOH reviewed no interventions necessary  Readmission risk has been reviewed Yes  Transition of care needs no transition of care needs at this time

## 2022-10-30 NOTE — Progress Notes (Signed)
14 Days Post-Op   Subjective/Chief Complaint: No acute changes.  Patient and his wife, who is on speaker phone during my exam, would like to know how soon he can start immunotherapy and if it can be started in the hospital. Patient states his nausea and emesis is stable. He denies urinary issues. Last BM was about 24 hours ago.    Objective: Vital signs in last 24 hours: Temp:  [97.6 F (36.4 C)-98.9 F (37.2 C)] 98.9 F (37.2 C) (08/07 0623) Pulse Rate:  [72-91] 72 (08/07 0623) Resp:  [16-18] 16 (08/07 0623) BP: (113-157)/(55-98) 113/55 (08/07 0623) SpO2:  [93 %-97 %] 93 % (08/07 0623) Weight:  [83.1 kg] 83.1 kg (08/07 0500) Last BM Date : 10/29/22  Intake/Output from previous day: 08/06 0701 - 08/07 0700 In: 1549.8 [P.O.:240; I.V.:1109.8; IV Piggyback:200] Out: -  Intake/Output this shift: No intake/output data recorded.  Incision is intact.  Eakin pouch in place over inferior aspect of wound with small amt green drainage in pouch.  J-tube without leakage around it today,  No cellulitis - there is skin irration where sutures are.   Lab Results:  Recent Labs    10/29/22 0617  WBC 9.1  HGB 12.1*  HCT 37.5*  PLT 224   BMET Recent Labs    10/28/22 0549 10/30/22 0514  NA 138 135  K 3.9 4.2  CL 104 103  CO2 27 26  GLUCOSE 153* 169*  BUN 26* 24*  CREATININE 0.79 0.62  CALCIUM 8.0* 8.1*   PT/INR No results for input(s): "LABPROT", "INR" in the last 72 hours. ABG No results for input(s): "PHART", "HCO3" in the last 72 hours.  Invalid input(s): "PCO2", "PO2"  Studies/Results: CT ABDOMEN PELVIS W CONTRAST  Result Date: 10/28/2022 CLINICAL DATA:  Postoperative abdominal pain. EXAM: CT ABDOMEN AND PELVIS WITH CONTRAST TECHNIQUE: Multidetector CT imaging of the abdomen and pelvis was performed using the standard protocol following bolus administration of intravenous contrast. RADIATION DOSE REDUCTION: This exam was performed according to the departmental  dose-optimization program which includes automated exposure control, adjustment of the mA and/or kV according to patient size and/or use of iterative reconstruction technique. CONTRAST:  OMNIPAQUE IOHEXOL 300 MG/ML SOLN, OMNIPAQUE IOHEXOL 300 MG/ML SOLN COMPARISON:  October 23, 2022. FINDINGS: Lower chest: Mild bibasilar subsegmental atelectasis. Gas is now seen in the anterior mediastinal region. Hepatobiliary: Stable gallbladder distention. No biliary dilatation is noted. No cholelithiasis is noted. Stable hepatic cysts. Pancreas: Unremarkable. No pancreatic ductal dilatation or surrounding inflammatory changes. Spleen: Normal in size without focal abnormality. Adrenals/Urinary Tract: Adrenal glands are unremarkable. Bilateral renal cysts are noted for which no further follow-up is required. No hydronephrosis or renal obstruction. Urinary bladder is unremarkable. Stomach/Bowel: Continued gastric wall thickening and body and antrum consistent with history of gastric carcinoma. Jejunostomy tube is again noted. Mildly dilated small bowel loops are noted in the right lower quadrant with mild wall thickening, with this appears to be overall improved compared to prior exam. No colonic dilatation is noted. The pneumatosis noted on prior exam is nearly resolved, although there remains a large amount of free air both adjacent to the jejunal loops as well as in the epigastric region, which potentially is postsurgical in etiology. Status post appendectomy. There is no definite evidence of leakage of contrast. Vascular/Lymphatic: No significant vascular abnormality is noted. 8 mm gastrohepatic lymph node is noted suggesting metastatic disease. Reproductive: Stable mild prostatic enlargement. Other: Mild ascites is noted. No hernia is noted. Free air is  noted in the anterior abdominal wall which is increased compared to prior exam. Musculoskeletal: No acute or significant osseous findings. IMPRESSION: Continued  gastric wall thickening of body and antrum consistent with history of gastric carcinoma. 8 mm gastrohepatic lymph node is also noted suggesting metastatic disease. Continued presence of jejunostomy tube. Pneumatosis involving jejunum on prior exam is nearly resolved, and there is significantly decreased small bowel dilatation compared to prior exam as well. However, there remains a large amount of free air adjacent to small bowel loops and in the mesentery as well as in the epigastric region. An increased amount of air is seen in the subcutaneous tissues of the anterior abdominal wall of the right lower quadrant as well as air seen extending into the anterior mediastinum in the visualized chest. Mild ascites is noted. While this free air may be postsurgical in etiology, the possibility of bowel perforation still cannot be excluded. However, there is no definite evidence of extravasated contrast on this exam. These results will be called to the ordering clinician or representative by the Radiologist Assistant, and communication documented in the PACS or zVision Dashboard. Electronically Signed   By: Lupita Raider M.D.   On: 10/28/2022 10:43    Anti-infectives: Anti-infectives (From admission, onward)    Start     Dose/Rate Route Frequency Ordered Stop   10/24/22 0900  ceFEPIme (MAXIPIME) 2 g in sodium chloride 0.9 % 100 mL IVPB        2 g 200 mL/hr over 30 Minutes Intravenous Every 12 hours 10/24/22 0805     10/24/22 0900  metroNIDAZOLE (FLAGYL) IVPB 500 mg        500 mg 100 mL/hr over 60 Minutes Intravenous Every 12 hours 10/24/22 0805     10/16/22 0730  cefoTEtan (CEFOTAN) 2 g in sodium chloride 0.9 % 100 mL IVPB        2 g 200 mL/hr over 30 Minutes Intravenous On call to O.R. 10/15/22 1051 10/16/22 1845       Assessment/Plan: s/p Procedure(s): DIAGNOSTIC LAPAROSCOPY, OPEN J TUBE PLACEMENT (N/A) INSERTION PORT-A-CATH (N/A) Bilious drainage noted from his wound starting on Saturday 8/3. CT  scan repeated 8/5 with IV and PO contrast (per J tube). Interval improvement in small bowel pneumatosis and small bowel dilation. Increased amount of free are in the subcutaneous tissues of the abdominal wall and persistent free air in the abdomen.no extravasation of contrast.  - patient is hemodynamically stable, non-toxic appearing, no peritonitis. 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 J tube was fully deflated yesterday as well. Would recommend continued observation of this wound/fistula in the hosptial. Continue TPN - will ask pharmacy to start cycling this to a shorter timeframe, goal of 12h cycle if possible. - appreciate oncology and palliative following.   Continue NPO, TPN Continue IV abx. Will discuss stop date with MD. Pneumatosis improving. There is ascites in the abdomen but this may be malignant ascites and not bilious acites. Do not use the J-tube      LOS: 15 days    Adam Phenix 10/30/2022

## 2022-10-30 NOTE — Progress Notes (Signed)
Nutrition Follow-up  DOCUMENTATION CODES:   Severe malnutrition in context of chronic illness  INTERVENTION:   -Recommend increasing calories in TPN to meet estimated needs given increased needs to promote healing of EC fistula.  Spoke with Pharmacy, plan is to increase dextrose in tomorrow's dose.  -Daily weights while on TPN  NUTRITION DIAGNOSIS:   Severe Malnutrition related to chronic illness, cancer and cancer related treatments as evidenced by percent weight loss, energy intake < or equal to 75% for > or equal to 1 month, mild fat depletion.  Ongoing.  GOAL:   Patient will meet greater than or equal to 90% of their needs  Meeting with TPN  MONITOR:   Labs, Weight trends, I & O's (TPN)  ASSESSMENT:   67 y.o. male with medical history of recent diagnosis of adenocarcinoma, staging is still pending.  Patient went to oncology today due to poor p.o. intake, he barely is able to drink 8 ounce of water and 1-2 shakes a day.  Due to this he was directly admitted to hospital per oncology request for PEG tube placement.  Per surgery note, pt's J-tube not to be used. Will continue TPN. Now with EC fistula.  TPN transitioning to cyclic tonight. Start rate at 59 mL/hr for 1 hour.  Increase rate to 118 mL/hr for 16 hours.  Decrease rate to 59 mL/hr for 1 hour  Provides 1824 kcals and 102g protein daily *Now patient with EC fistula, would try and meet needs as best we can given increased needs.  Admission weight: 159 lbs Current weight: 183 lbs I/Os: +19.9L  Medications reviewed.  Labs reviewed: CBGs: 146-171   Diet Order:   Diet Order             Diet NPO time specified Except for: Ice Chips, Other (See Comments)  Diet effective now                   EDUCATION NEEDS:   Education needs have been addressed  Skin:  Skin Assessment: Skin Integrity Issues: Skin Integrity Issues:: Incisions Incisions: 7/24  Last BM:  8/6  Height:   Ht Readings from Last 1  Encounters:  10/16/22 6' (1.829 m)    Weight:   Wt Readings from Last 1 Encounters:  10/30/22 83.1 kg    BMI:  Body mass index is 24.85 kg/m.  Estimated Nutritional Needs:   Kcal:  2150-2350  Protein:  105-120g  Fluid:  2.1L/day   Tilda Franco, MS, RD, LDN Inpatient Clinical Dietitian Contact information available via Amion

## 2022-10-31 DIAGNOSIS — K311 Adult hypertrophic pyloric stenosis: Secondary | ICD-10-CM | POA: Diagnosis not present

## 2022-10-31 LAB — GLUCOSE, CAPILLARY
Glucose-Capillary: 177 mg/dL — ABNORMAL HIGH (ref 70–99)
Glucose-Capillary: 103 mg/dL — ABNORMAL HIGH (ref 70–99)
Glucose-Capillary: 131 mg/dL — ABNORMAL HIGH (ref 70–99)
Glucose-Capillary: 169 mg/dL — ABNORMAL HIGH (ref 70–99)

## 2022-10-31 MED ORDER — DEXTROSE 10 % IV SOLN: INTRAVENOUS | Status: AC

## 2022-10-31 MED ORDER — TRAVASOL 10 % IV SOLN
INTRAVENOUS | Status: AC
  Filled 2022-10-31: qty 1155

## 2022-10-31 MED ORDER — INSULIN ASPART 100 UNIT/ML IJ SOLN
0.0000 [IU] | INTRAMUSCULAR | Status: DC
  Administered 2022-10-31: 1 [IU] via SUBCUTANEOUS
  Administered 2022-10-31: 2 [IU] via SUBCUTANEOUS
  Administered 2022-11-01: 1 [IU] via SUBCUTANEOUS
  Administered 2022-11-01: 2 [IU] via SUBCUTANEOUS

## 2022-10-31 NOTE — TOC Progression Note (Signed)
Transition of Care [facility]) - Progression Note    Patient Details  Name: Steven Wolf MRN: [id number] Date of Birth: 04/18/1955  Transition of Care [facility]) CM/SW Contact  Beckie Busing, RN Phone Number:[phone]  10/31/2022, 11:35 AM  Clinical Narrative:    TOC acknowledges second consult for Home Health / DME Needs. TOC has previously been consulted and is working with [facility] for Home TNA which can be initiated tomorrow 11/01/2022 or Monday 8/12. TNA can not be compounded on the weekend. HH has been previously set up with [facility]. TOC will continue to follow for discharge readiness.    Expected Discharge Plan: Home w Home Health Services Barriers to Discharge: Continued Medical Work up  Expected Discharge Plan and Services     Post Acute Care Choice: Home Health Living arrangements for the past 2 months: Single Family Home                           HH Arranged: RN HH Agency: [facility] Date [facility] Agency Contacted: 10/16/22 Time HH Agency Contacted: 1047 Representative spoke with at [facility] Agency: Kandee Keen   Social Determinants of Health (SDOH) Interventions SDOH Screenings   Food Insecurity: No Food Insecurity (10/14/2022)  Housing: Low Risk  (10/14/2022)  Transportation Needs: No Transportation Needs (10/14/2022)  Utilities: Not At Risk (10/14/2022)  Depression (PHQ2-9): Medium Risk (08/13/2022)  Financial Resource Strain: Low Risk  (08/12/2022)  Physical Activity: Sufficiently Active (08/12/2022)  Social Connections: Unknown (08/12/2022)  Stress: Stress Concern Present (08/12/2022)  Tobacco Use: Low Risk  (10/16/2022)    Readmission Risk Interventions    10/30/2022    2:51 PM  Readmission Risk Prevention Plan  Transportation Screening Complete  PCP or Specialist Appt within 5-7 Days Complete  Home Care Screening Complete  Medication Review (RN CM) Referral to Pharmacy

## 2022-10-31 NOTE — Plan of Care (Signed)
  Problem: Education: Goal: Knowledge of General Education information will improve Description: Including pain rating scale, medication(s)/side effects and non-pharmacologic comfort measures Outcome: Progressing   Problem: Health Behavior/Discharge Planning: Goal: Ability to manage health-related needs will improve Outcome: Progressing   Problem: Clinical Measurements: Goal: Ability to maintain clinical measurements within normal limits will improve Outcome: Progressing Goal: Will remain free from infection Outcome: Progressing Goal: Respiratory complications will improve Outcome: Progressing Goal: Cardiovascular complication will be avoided Outcome: Progressing   Problem: Activity: Goal: Risk for activity intolerance will decrease Outcome: Progressing   Problem: Nutrition: Goal: Adequate nutrition will be maintained Outcome: Progressing   Problem: Coping: Goal: Level of anxiety will decrease Outcome: Progressing   Problem: Elimination: Goal: Will not experience complications related to bowel motility Outcome: Progressing Goal: Will not experience complications related to urinary retention Outcome: Progressing   Problem: Pain Managment: Goal: General experience of comfort will improve Outcome: Progressing   Problem: Safety: Goal: Ability to remain free from injury will improve Outcome: Progressing   Problem: Skin Integrity: Goal: Risk for impaired skin integrity will decrease Outcome: Progressing   Problem: Education: Goal: Ability to describe self-care measures that may prevent or decrease complications (Diabetes Survival Skills Education) will improve Outcome: Progressing Goal: Individualized Educational Video(s) Outcome: Progressing   Problem: Coping: Goal: Ability to adjust to condition or change in health will improve Outcome: Progressing   Problem: Fluid Volume: Goal: Ability to maintain a balanced intake and output will improve Outcome:  Progressing   Problem: Health Behavior/Discharge Planning: Goal: Ability to identify and utilize available resources and services will improve Outcome: Progressing Goal: Ability to manage health-related needs will improve Outcome: Progressing   Problem: Metabolic: Goal: Ability to maintain appropriate glucose levels will improve Outcome: Progressing   Problem: Nutritional: Goal: Maintenance of adequate nutrition will improve Outcome: Progressing Goal: Progress toward achieving an optimal weight will improve Outcome: Progressing   Problem: Skin Integrity: Goal: Risk for impaired skin integrity will decrease Outcome: Progressing   Problem: Tissue Perfusion: Goal: Adequacy of tissue perfusion will improve Outcome: Progressing

## 2022-10-31 NOTE — Progress Notes (Signed)
15 Days Post-Op   Subjective/Chief Complaint: No acute changes.  No abdominal pain. Nausea stable. No vomiting yesterday. No output from eakin's on I/O yesterday. 50cc recorded so far today.    Objective: Vital signs in last 24 hours: Temp:  [97.5 F (36.4 C)-98 F (36.7 C)] 98 F (36.7 C) (08/08 0556) Pulse Rate:  [87-109] 87 (08/08 0556) Resp:  [16-17] 17 (08/08 0556) BP: (125-137)/(90-99) 128/90 (08/08 0556) SpO2:  [97 %-98 %] 97 % (08/08 0556) Last BM Date : 10/29/22  Intake/Output from previous day: 08/07 0701 - 08/08 0700 In: 414.7 [I.V.:14.7; IV Piggyback:400] Out: -  Intake/Output this shift: Total I/O In: -  Out: 50 [Other:50]  Incision is intact.  Eakin pouch in place over inferior aspect of wound with small amt green drainage in pouch.  J-tube without leakage around it today  Lab Results:  Recent Labs    10/29/22 0617 10/31/22 0645  WBC 9.1 10.7*  HGB 12.1* 11.3*  HCT 37.5* 34.7*  PLT 224 276   BMET Recent Labs    10/30/22 0514 10/31/22 0645  NA 135 134*  K 4.2 4.2  CL 103 102  CO2 26 26  GLUCOSE 169* 157*  BUN 24* 24*  CREATININE 0.62 0.63  CALCIUM 8.1* 8.1*   PT/INR No results for input(s): "LABPROT", "INR" in the last 72 hours. ABG No results for input(s): "PHART", "HCO3" in the last 72 hours.  Invalid input(s): "PCO2", "PO2"  Studies/Results: No results found.  Anti-infectives: Anti-infectives (From admission, onward)    Start     Dose/Rate Route Frequency Ordered Stop   10/24/22 0900  ceFEPIme (MAXIPIME) 2 g in sodium chloride 0.9 % 100 mL IVPB        2 g 200 mL/hr over 30 Minutes Intravenous Every 12 hours 10/24/22 0805     10/24/22 0900  metroNIDAZOLE (FLAGYL) IVPB 500 mg        500 mg 100 mL/hr over 60 Minutes Intravenous Every 12 hours 10/24/22 0805     10/16/22 0730  cefoTEtan (CEFOTAN) 2 g in sodium chloride 0.9 % 100 mL IVPB        2 g 200 mL/hr over 30 Minutes Intravenous On call to O.R. 10/15/22 1051 10/16/22 1845        Assessment/Plan: s/p Procedure(s): DIAGNOSTIC LAPAROSCOPY, OPEN J TUBE PLACEMENT (N/A) INSERTION PORT-A-CATH (N/A) Bilious drainage noted from his wound starting on Saturday 8/3. CT scan repeated 8/5 with IV and PO contrast (per J tube). Interval improvement in small bowel pneumatosis and small bowel dilation. Increased amount of free are in the subcutaneous tissues of the abdominal wall and persistent free air in the abdomen.no extravasation of contrast.  - Patient is hemodynamically stable, non-toxic appearing, no peritonitis. 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 J tube was fully deflated earlier this week as well. Continue to observe. Hold J-tube feeds. Do not use the J-tube - Appreciate oncology following. They plan to start immunotherapy after d/c.  - Appreciate palliative following and input.   -Continue NPO and TPN (pharmacy working on cycling). Do not use the J-tube - Continue IV abx for now but plan to stop at d/c - From a Surgery standpoint he can go home with home health for wound care, PT, TNA, etc. Whenever this is arranged. TOC consult placed.   LOS: 16 days    Turney Blount [facility] 10/31/2022

## 2022-10-31 NOTE — Progress Notes (Signed)
PROGRESS NOTE    Steven BOCOOK  ZOX:[id number] DOB: 05/10/1955 DOA: 10/14/2022 PCP: Pincus Sanes, MD   Brief Narrative: Steven Wolf is a 67 y.o. male with a history of gastric adenocarcinoma, hypertension, depression. Patient presented secondary to need for J-tube placement for poor oral intake secondary to gastric outlet obstruction. Post J-tube placement, patient with inability to advance tube feeds. General surgery consulted for concern of pnematosis. Patient continued to fail advancement of tube feed rate. Tube feeds held and TPN started on 8/1. Antibiotics started.    Assessment and Plan:  Gastric outlet obstruction Secondary to recently diagnosed gastric adenocarcinoma J-tube placed on 7/24, currently not tolerating advancement of tube feeds secondary to recurrent nausea and vomiting Continue TPN  Pneumatosis EC Fistula General surgery consulted, pneumatosis likely related to J-tube placement Repeat CT scan on 8/5 showed drainage from site consistent with fistula formation General surgery recommendations: NPO, stop PO meds and tube feeds, continue TPN Continue Cefepime/Flagyl  Gastric adenocarcinoma Patient follows with Dr. Mosetta Putt as an outpatient with possible plan to start immunotherapy as an outpt once discharged  Primary hypertension Irbesartan discontinued secondary to NPO status and inability to use J-tube Hydralazine IV PRN with parameters  GERD Continue Protonix  Depression Prozac held secondary to NPO status  Severe malnutrition TPN Dietician on board    DVT prophylaxis: Lovenox Code Status:   Code Status: Full Code Family Communication: None at bedside Disposition Plan: Discharge pending further recs as per gen surg and TOC/HH      Consultants:  General surgery [place] gastro[facility] Medical oncology Palliative care medicine  Procedures:  7/24: EUS  Antimicrobials: Cefepime Flagyl    Subjective: Denies any new  complaints.   Objective: BP (!) 129/96 (BP Location: Right Arm)   Pulse 100   Temp 98.2 F (36.8 C) (Oral)   Resp 20   Ht 6' (1.829 m)   Wt 83.1 kg   SpO2 98%   BMI 24.85 kg/m   Examination: General: NAD  Cardiovascular: S1, S2 present Respiratory: CTAB Abdomen: Soft, nontender, nondistended, bowel sounds present Musculoskeletal: No bilateral pedal edema noted Skin: Normal Psychiatry: Normal mood     Data Reviewed: I have personally reviewed following labs and imaging studies  CBC Lab Results  Component Value Date   WBC 10.7 (H) 10/31/2022   RBC 3.72 (L) 10/31/2022   HGB 11.3 (L) 10/31/2022   HCT 34.7 (L) 10/31/2022   MCV 93.3 10/31/2022   MCH 30.4 10/31/2022   PLT 276 10/31/2022   MCHC 32.6 10/31/2022   RDW 13.4 10/31/2022   LYMPHSABS 0.7 10/31/2022   MONOABS 1.0 10/31/2022   EOSABS 0.2 10/31/2022   BASOSABS 0.1 10/31/2022     Last metabolic panel Lab Results  Component Value Date   NA 134 (L) 10/31/2022   K 4.2 10/31/2022   CL 102 10/31/2022   CO2 26 10/31/2022   BUN 24 (H) 10/31/2022   CREATININE 0.63 10/31/2022   GLUCOSE 157 (H) 10/31/2022   GFRNONAA >60 10/31/2022   GFRAA 73 12/13/2019   CALCIUM 8.1 (L) 10/31/2022   PHOS 3.0 10/31/2022   PROT 4.9 (L) 10/31/2022   ALBUMIN 2.1 (L) 10/31/2022   BILITOT 0.7 10/31/2022   ALKPHOS 134 (H) 10/31/2022   AST 19 10/31/2022   ALT 22 10/31/2022   ANIONGAP 6 10/31/2022    GFR: Estimated Creatinine Clearance: 95.7 mL/min (by C-G formula based on SCr of 0.63 mg/dL).  No results found for this or any previous visit (from  the past 240 hour(s)).    Radiology Studies: No results found.    LOS: 16 days    Briant Cedar, MD [facility] 10/31/2022, 2:16 PM   If 7PM-7AM, please contact night-coverage [email or web address]

## 2022-10-31 NOTE — Progress Notes (Signed)
PHARMACY - TOTAL PARENTERAL NUTRITION CONSULT NOTE   Indication:  Gastric Outlet Obstruction  Patient Measurements: Height: 6' (182.9 cm) Weight: 83.1 kg (183 lb 3.2 oz) IBW/kg (Calculated) : 77.6 TPN AdjBW (KG): 72.3 Body mass index is 24.85 kg/m. Usual Weight: 72kg  Assessment:  67 YO male with metastatic gastric cancer and outlet obstruction. Patient had J-tube placement 7/24 due to poor oral intake secondary to gastric outlet obstruction. He has been unable to advance tube feeds since J-tube placement. KUB from 7/31 showed air-fluid levels of the small bowel and CT scan showed pneumatosis in the area around the J-tube. TPN to start in the setting of gastric outlet obstruction and inability to advance enteral feeding.  Glucose / Insulin: No DM hx CBGs 148 and 177 after 18hr cyclic TPN started yesterday evening Electrolytes: All WNL except Na slightly low at 134 Renal: SCr <1, stable (8/5) Hepatic:  AST/ALT, Tbili WNL. Alk phos 134 Albumin low  TGs 148 (8/5) Intake / Output; MIVF:  Urine x 1 documented - per RN, UOP likely not accurately documented (pt takes self to bathroom) Intake: 4492 mL GI Imaging: - 7/19 CT a/p: Gastric wall thickening, consistent with known gastric cancer. Perigastric fat stranding, concerning for local invasion. New prominent subcentimeter gastrohepatic ligament and left para-aortic lymph nodes, concerning for nodal metastatic disease. - 7/31 CT a/p: Interval placement of jejunostomy catheter into the proximal jejunum. Pneumatosis of proximal jejunum beginning just distal to the jejunostomy catheter, extending into the mesentery. Mid and distal small bowel loops are distended without wall thickening or pneumatosis. Small amount of free intraperitoneal gas and scattered abdominal and pelvic ascites, new since previous, potentially related to recent surgery. Persistent gastric wall thickening in the body and antrum with gastrohepatic ligament adenopathy as  previously noted, consistent with history of gastric carcinoma. Patchy atelectasis or infiltrates posteriorly in both lung bases, new since previous. - 8/5 CT a/p:  Interval improvement in small bowel pneumatosis and small bowel dilatation. An increased amount of air is seen in the subcutaneous tissues of the anterior abdominal wall of the right lower quadrant as well as air seen extending into the anterior mediastinum in the visualized chest. No extravasation of contrast.  GI Surgeries / Procedures: - 7/24: diagnostic laparoscopy, open J tube placement, port-a-cath insertion, upper EUS  Central access: port-a-cath placed 7/24 TPN start date: 8/1  Nutritional Goals: Goal TPN rate is 90 mL/hr (provides 110 g of protein and 2219 kcals per day) 8/7: transition to cyclic TPN with 12 hr goal TPN run time  - goal 2.1L to run 95 ml/hr x 1 hr, 191 ml/hr x 10 hr, then 95 ml/hr x1 hr with 116 g protein and  2163 kcal  RD Assessment: Estimated Needs Total Energy Estimated Needs: 2150-2350 Total Protein Estimated Needs: 105-120g Total Fluid Estimated Needs: 2.1L/day RD also recommended thiamine 100mg  IV daily x 5 days due to risk for refeeding syndrome   Current Nutrition:  NPO except for ice chips TPN  Plan:  AM:  - TPN tubing disconnected sometime during the night, order placed for D10W at 83 ml/hr at 0800 to stop at 1800 when TPN bag can be hung  At 1800: - Labs are good despite not knowing exactly when TPN tubing was disconnected last night - Will do another night of 18hr cyclic administration since didn't complete a full bag yet. Same rates as planned for yesterday - 62 ml/hr x 1 hr, 124 ml/hr x 16 hr, 62 ml/hr x 1 -  Electrolytes in TPN:  Na 52mEq/L K 46mEq/L Ca 31mEq/L Mg 32mEq/L Phos 31mmol/L Cl:Ac 1:1 - Add standard MVI and trace elements to TPN - Change SSI/CBGs checks due to start of cyclic TPN - will do time of 2000, 0600 while TPN is running, then 1 hr after TPN is off at 1300  while TPN runs and 1 CBG at 1600 while TPN is off as well - 5 days Thiamine completed - Monitor TPN labs on Mon/Thurs and as needed - CMET, Mg, Phos in AM   Hessie Knows, PharmD, BCPS Secure Chat if ?s 10/31/2022 9:28 AM

## 2022-11-01 DIAGNOSIS — K311 Adult hypertrophic pyloric stenosis: Secondary | ICD-10-CM | POA: Diagnosis not present

## 2022-11-01 LAB — GLUCOSE, CAPILLARY
Glucose-Capillary: 196 mg/dL — ABNORMAL HIGH (ref 70–99)
Glucose-Capillary: 128 mg/dL — ABNORMAL HIGH (ref 70–99)
Glucose-Capillary: 106 mg/dL — ABNORMAL HIGH (ref 70–99)
Glucose-Capillary: 199 mg/dL — ABNORMAL HIGH (ref 70–99)

## 2022-11-01 MED ORDER — INSULIN ASPART 100 UNIT/ML IJ SOLN
0.0000 [IU] | INTRAMUSCULAR | Status: DC
  Administered 2022-11-01 – 2022-11-02 (×2): 2 [IU] via SUBCUTANEOUS

## 2022-11-01 MED ORDER — TRAVASOL 10 % IV SOLN
INTRAVENOUS | Status: AC
  Filled 2022-11-01: qty 1155

## 2022-11-01 NOTE — TOC Progression Note (Signed)
Transition of Care [facility]) - Progression Note    Patient Details  Name: Steven Wolf MRN: [id number] Date of Birth: 1955/03/27  [facility] CM/SW Contact  Beckie Busing, RN Phone [phone]  11/01/2022, 12:02 PM  Clinical Narrative:    TOC following patient for expected disposition home with TPN. Per Pam with [facility] TPN will be ready for discharge but Frances Furbish is attempting to obtain a RN for TPN. CM has confirmed that [facility] does not have a RN to see patient until Sunday which means patient can not d/c until Sunday. [facility] will have his TNA  ready and RN will follow up on Sunday. MD has been updated. DME hospital bed and rolling walker have been ordered per [employer].    Expected Discharge Plan: Home w Home Health Services Barriers to Discharge: Continued Medical Work up  Expected Discharge Plan and Services     Post Acute Care Choice: Home Health Living arrangements for the past 2 months: Single Family Home                           HH Arranged: RN HH Agency: [facility] Date [facility] Agency Contacted: 10/16/22 Time HH Agency Contacted: 1047 Representative spoke with at [facility] Agency: Kandee Keen   Social Determinants of Health (SDOH) Interventions SDOH Screenings   Food Insecurity: No Food Insecurity (10/14/2022)  Housing: Low Risk  (10/14/2022)  Transportation Needs: No Transportation Needs (10/14/2022)  Utilities: Not At Risk (10/14/2022)  Depression (PHQ2-9): Medium Risk (08/13/2022)  Financial Resource Strain: Low Risk  (08/12/2022)  Physical Activity: Sufficiently Active (08/12/2022)  Social Connections: Unknown (08/12/2022)  Stress: Stress Concern Present (08/12/2022)  Tobacco Use: Low Risk  (10/16/2022)    Readmission Risk Interventions    10/30/2022    2:51 PM  Readmission Risk Prevention Plan  Transportation Screening Complete  PCP or Specialist Appt within 5-7 Days Complete  Home Care Screening Complete  Medication Review (RN CM) Referral to  Pharmacy

## 2022-11-01 NOTE — Progress Notes (Addendum)
    Durable Medical Equipment  (From admission, onward)           Start     Ordered   11/01/22 1207  For home use only DME Hospital bed  Once       Comments: Therapeutic mattress  Question Answer Comment  Length of Need Lifetime   Patient has (list medical condition): gastric adenocarcinoma with TPN   The above medical condition requires: Patient requires the ability to reposition immediately   Head must be elevated greater than: 45 degrees   Bed type Semi-electric      11/01/22 1206   11/01/22 1207  For home use only DME Walker rolling  Once       Question Answer Comment  Walker: With 5 Inch Wheels   Patient needs a walker to treat with the following condition Weakness      11/01/22 1206

## 2022-11-01 NOTE — Progress Notes (Signed)
16 Days Post-Op   Subjective/Chief Complaint: No acute changes.  No abdominal pain. Nausea stable.   Objective: Vital signs in last 24 hours: Temp:  [97.9 F (36.6 C)-98.2 F (36.8 C)] 97.9 F (36.6 C) (08/09 0619) Pulse Rate:  [93-100] 93 (08/09 0619) Resp:  [16-20] 16 (08/09 0619) BP: (124-134)/(89-96) 124/89 (08/09 0619) SpO2:  [97 %-98 %] 97 % (08/09 0619) Weight:  [79.8 kg] 79.8 kg (08/09 0619) Last BM Date : 10/29/22  Intake/Output from previous day: 08/08 0701 - 08/09 0700 In: 2197.3 [I.V.:1697.3; IV Piggyback:500] Out: 75  Intake/Output this shift: No intake/output data recorded.  Incision is intact.  Eakin pouch in place over inferior aspect of wound with small amt green drainage in pouch.  J-tube without leakage around it today  Lab Results:  Recent Labs    10/31/22 0645 11/01/22 0710  WBC 10.7* 10.8*  HGB 11.3* 11.1*  HCT 34.7* 34.8*  PLT 276 289   BMET Recent Labs    10/30/22 0514 10/31/22 0645  NA 135 134*  K 4.2 4.2  CL 103 102  CO2 26 26  GLUCOSE 169* 157*  BUN 24* 24*  CREATININE 0.62 0.63  CALCIUM 8.1* 8.1*   PT/INR No results for input(s): "LABPROT", "INR" in the last 72 hours. ABG No results for input(s): "PHART", "HCO3" in the last 72 hours.  Invalid input(s): "PCO2", "PO2"  Studies/Results: No results found.  Anti-infectives: Anti-infectives (From admission, onward)    Start     Dose/Rate Route Frequency Ordered Stop   10/24/22 0900  ceFEPIme (MAXIPIME) 2 g in sodium chloride 0.9 % 100 mL IVPB        2 g 200 mL/hr over 30 Minutes Intravenous Every 12 hours 10/24/22 0805     10/24/22 0900  metroNIDAZOLE (FLAGYL) IVPB 500 mg        500 mg 100 mL/hr over 60 Minutes Intravenous Every 12 hours 10/24/22 0805     10/16/22 0730  cefoTEtan (CEFOTAN) 2 g in sodium chloride 0.9 % 100 mL IVPB        2 g 200 mL/hr over 30 Minutes Intravenous On call to O.R. 10/15/22 1051 10/16/22 1845       Assessment/Plan: s/p  Procedure(s): DIAGNOSTIC LAPAROSCOPY, OPEN J TUBE PLACEMENT (N/A) INSERTION PORT-A-CATH (N/A) Bilious drainage noted from his wound starting on Saturday 8/3. CT scan repeated 8/5 with IV and PO contrast (per J tube). Interval improvement in small bowel pneumatosis and small bowel dilation. Increased amount of free are in the subcutaneous tissues of the abdominal wall and persistent free air in the abdomen.no extravasation of contrast.  - Patient is hemodynamically stable, non-toxic appearing, no peritonitis. 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 J tube was fully deflated earlier this week as well. Continue to observe. Hold J-tube feeds. Do not use the J-tube - Appreciate oncology following. They plan to start immunotherapy after d/c.  - Appreciate palliative following and input.   -Continue NPO and TPN (pharmacy working on cycling). Do not use the J-tube - ok to stop abx - stable for discharge with home health service, home health/TPN from [facility] standpoint. Do not use J tube. Follow up with Dr. Donell Beers in one week has been arranged. Discussed lifting restrictions, showering, driving, etc with patient. All questions welcomed and answered.   LOS: 17 days    Adam Phenix 11/01/2022

## 2022-11-01 NOTE — Evaluation (Signed)
Physical Therapy Evaluation Only  Patient Details Name: Steven Wolf MRN: [id number] DOB: 06-09-1955 Today's Date: 11/01/2022  History of Present Illness  67 yo male presents to therapy s/p hospital admission on 10/14/2022 for J-tube placement on 10/16/2022 secondary to poor PO intake attributed to gastric outlet obstruction. Pt progressing poorly with tube feeds. Pt dx 09/2022 with stomach adenocarcinoma. Pt PMH includes but is not limited to: anxiety, BPH, diverticulosis of colon, GERD, gilbert syndrome, HTN, kidney stones, OSA on CPAP, and depression.  Clinical Impression    Patient evaluated by Physical Therapy with no further acute PT needs identified. All education has been completed and the patient has no further PT questions. Pt is mod I with bed mobility, transfer tasks and gait on level surfaces with RW up to 500 feet. PT assessed safety and IND with stair navigation with R handrail and CGA. See below for any follow-up Physical Therapy or DME needs with RW provided and PT adjusted to appropriate height at time of eval. PT is signing off. Thank you for this referral.        If plan is discharge home, recommend the following: Assistance with cooking/housework;Assist for transportation   Can travel by private vehicle        Equipment Recommendations None recommended by PT (pt has personal RW in hospital room and adjusted to appropriate height)  Recommendations for Other Services       Functional Status Assessment Patient has had a recent decline in their functional status and/or demonstrates limited ability to make significant improvements in function in a reasonable and predictable amount of time     Precautions / Restrictions Precautions Precautions: Fall Restrictions Weight Bearing Restrictions: No      Mobility  Bed Mobility Overal bed mobility: Modified Independent                  Transfers Overall transfer level: Modified independent Equipment used: Rolling  walker (2 wheels)                    Ambulation/Gait Ambulation/Gait assistance: Modified independent (Device/Increase time) Gait Distance (Feet): 500 Feet Assistive device: Rolling walker (2 wheels) Gait Pattern/deviations: Step-through pattern Gait velocity: slightly decreased        Stairs Stairs: Yes Stairs assistance: Contact guard assist Stair Management: One rail Right Number of Stairs: 12 General stair comments: min cues for safety wtih step to pattern and pt and family ed provided on placement of RW  Wheelchair Mobility     Tilt Bed    Modified Rankin (Stroke Patients Only)       Balance Overall balance assessment: Mild deficits observed, not formally tested (G static standing and F+ dynamic no UE support)                                           Pertinent Vitals/Pain Pain Assessment Pain Assessment: No/denies pain    Home Living Family/patient expects to be discharged to:: Private residence Living Arrangements: Spouse/significant other Available Help at Discharge: Family Type of Home: House Home Access: Stairs to enter Entrance Stairs-Rails: Doctor, general practice of Steps: 4 Alternate Level Stairs-Number of Steps: flight Home Layout: Two level;Bed/bath upstairs Home Equipment: None      Prior Function Prior Level of Function : Independent/Modified Independent;Driving  Extremity/Trunk Assessment        Lower Extremity Assessment Lower Extremity Assessment: Overall WFL for tasks assessed    Cervical / Trunk Assessment Cervical / Trunk Assessment: Normal  Communication   Communication Communication: No apparent difficulties  Cognition Arousal: Alert Behavior During Therapy: WFL for tasks assessed/performed Overall Cognitive Status: Within Functional Limits for tasks assessed                                          General Comments      Exercises      Assessment/Plan    PT Assessment Patient does not need any further PT services  PT Problem List         PT Treatment Interventions      PT Goals (Current goals can be found in the Care Plan section)  Acute Rehab PT Goals Patient Stated Goal: to be able to eat PT Goal Formulation: With patient/family Time For Goal Achievement: 11/15/22 Potential to Achieve Goals: Good    Frequency       Co-evaluation               AM-PAC PT "6 Clicks" Mobility  Outcome Measure Help needed turning from your back to your side while in a flat bed without using bedrails?: None Help needed moving from lying on your back to sitting on the side of a flat bed without using bedrails?: None Help needed moving to and from a bed to a chair (including a wheelchair)?: None Help needed standing up from a chair using your arms (e.g., wheelchair or bedside chair)?: None Help needed to walk in hospital room?: None Help needed climbing 3-5 steps with a railing? : A Little 6 Click Score: 23    End of Session Equipment Utilized During Treatment: Gait belt Activity Tolerance: No increased pain;Patient tolerated treatment well Patient left: in chair;with call bell/phone within reach;with family/visitor present Nurse Communication: Mobility status PT Visit Diagnosis: Other abnormalities of gait and mobility (R26.89)    Time: 1610-9604 PT Time Calculation (min) (ACUTE ONLY): 14 min   Charges:   PT Evaluation $PT Eval Low Complexity: 1 Low   PT General Charges $$ ACUTE PT VISIT: 1 Visit         Johnny Bridge, PT Acute Rehab   Jacqualyn Posey 11/01/2022, 4:39 PM

## 2022-11-01 NOTE — Progress Notes (Signed)
PROGRESS NOTE    Steven Wolf  ZOX:[id number] DOB: July 15, 1955 DOA: 10/14/2022 PCP: Pincus Sanes, MD   Brief Narrative: Steven Wolf is a 67 y.o. male with a history of gastric adenocarcinoma, hypertension, depression. Patient presented secondary to need for J-tube placement for poor oral intake secondary to gastric outlet obstruction. Post J-tube placement, patient with inability to advance tube feeds. General surgery consulted for concern of pnematosis. Patient continued to fail advancement of tube feed rate. Tube feeds held and TPN started on 8/1. Antibiotics started.    Assessment and Plan:  Gastric outlet obstruction Secondary to recently diagnosed gastric adenocarcinoma J-tube placed on 7/24, currently not tolerating advancement of tube feeds secondary to recurrent nausea and vomiting Continue TPN  Pneumatosis EC Fistula General surgery consulted, pneumatosis likely related to J-tube placement Repeat CT scan on 8/5 showed drainage from site consistent with fistula formation General surgery recommendations: NPO, stop PO meds and tube feeds, continue TPN Completed Cefepime/Flagyl  Gastric adenocarcinoma Patient follows with Dr. Mosetta Putt as an outpatient with possible plan to start immunotherapy as an outpt once discharged  Primary hypertension Irbesartan discontinued secondary to NPO status and inability to use J-tube Hydralazine IV PRN with parameters  GERD Continue Protonix  Depression Prozac held secondary to NPO status  Severe malnutrition TPN Dietician on board    DVT prophylaxis: Lovenox Code Status:   Code Status: Full Code Family Communication: None at bedside Disposition Plan: Discharge pending [facility] placement      Consultants:  General surgery [facility] gastroenterology Medical oncology Palliative care medicine  Procedures:  7/24: EUS  Antimicrobials: Completed   Subjective: Denies any new complaints.   Objective: BP 112/84 (BP  Location: Right Arm)   Pulse (!) 110   Temp 98 F (36.7 C) (Oral)   Resp 20   Ht 6' (1.829 m)   Wt 79.8 kg   SpO2 97%   BMI 23.86 kg/m   Examination: General: NAD  Cardiovascular: S1, S2 present Respiratory: CTAB Abdomen: Soft, nontender, nondistended, bowel sounds present Musculoskeletal: No bilateral pedal edema noted Skin: Normal Psychiatry: Normal mood     Data Reviewed: I have personally reviewed following labs and imaging studies  CBC Lab Results  Component Value Date   WBC 10.8 (H) 11/01/2022   RBC 3.78 (L) 11/01/2022   HGB 11.1 (L) 11/01/2022   HCT 34.8 (L) 11/01/2022   MCV 92.1 11/01/2022   MCH 29.4 11/01/2022   PLT 289 11/01/2022   MCHC 31.9 11/01/2022   RDW 13.4 11/01/2022   LYMPHSABS 0.8 11/01/2022   MONOABS 1.0 11/01/2022   EOSABS 0.2 11/01/2022   BASOSABS 0.1 11/01/2022     Last metabolic panel Lab Results  Component Value Date   NA 133 (L) 11/01/2022   K 4.3 11/01/2022   CL 99 11/01/2022   CO2 27 11/01/2022   BUN 24 (H) 11/01/2022   CREATININE 0.65 11/01/2022   GLUCOSE 171 (H) 11/01/2022   GFRNONAA >60 11/01/2022   GFRAA 73 12/13/2019   CALCIUM 8.1 (L) 11/01/2022   PHOS 3.0 10/31/2022   PROT 5.3 (L) 11/01/2022   ALBUMIN 2.1 (L) 11/01/2022   BILITOT 0.6 11/01/2022   ALKPHOS 144 (H) 11/01/2022   AST 21 11/01/2022   ALT 23 11/01/2022   ANIONGAP 7 11/01/2022    GFR: Estimated Creatinine Clearance: 95.7 mL/min (by C-G formula based on SCr of 0.65 mg/dL).  No results found for this or any previous visit (from the past 240 hour(s)).    Radiology  Studies: No results found.    LOS: 17 days    Briant Cedar, MD [facility] 11/01/2022, 3:31 PM   If 7PM-7AM, please contact night-coverage [email or web address]

## 2022-11-01 NOTE — Progress Notes (Addendum)
PHARMACY - TOTAL PARENTERAL NUTRITION CONSULT NOTE   Indication:  Gastric Outlet Obstruction  Patient Measurements: Height: 6' (182.9 cm) Weight: 79.8 kg (175 lb 14.8 oz) IBW/kg (Calculated) : 77.6 TPN AdjBW (KG): 72.3 Body mass index is 23.86 kg/m. Usual Weight: 72kg  Assessment:  67 YO male with metastatic gastric cancer and outlet obstruction. Patient had J-tube placement 7/24 due to poor oral intake secondary to gastric outlet obstruction. He has been unable to advance tube feeds since J-tube placement. KUB from 7/31 showed air-fluid levels of the small bowel and CT scan showed pneumatosis in the area around the J-tube. TPN to start in the setting of gastric outlet obstruction and inability to advance enteral feeding.  Glucose / Insulin: No DM hx CBGs 169 & 196 during TPN rate of 124 mls/hr during 18 hr cycle, goal is < 180 Electrolytes: all WNL x Na 133 Renal: SCr <1, stable (8/8) Hepatic:  AST/ALT, Tbili WNL. Alk phos 144 (8/9) Albumin low  TGs 148 (8/5) Intake / Output; MIVF:  Per RN, UOP likely not accurately documented (pt takes self to bathroom) GI Imaging: - 7/19 CT a/p: Gastric wall thickening, consistent with known gastric cancer. Perigastric fat stranding, concerning for local invasion. New prominent subcentimeter gastrohepatic ligament and left para-aortic lymph nodes, concerning for nodal metastatic disease. - 7/31 CT a/p: Interval placement of jejunostomy catheter into the proximal jejunum. Pneumatosis of proximal jejunum beginning just distal to the jejunostomy catheter, extending into the mesentery. Mid and distal small bowel loops are distended without wall thickening or pneumatosis. Small amount of free intraperitoneal gas and scattered abdominal and pelvic ascites, new since previous, potentially related to recent surgery. Persistent gastric wall thickening in the body and antrum with gastrohepatic ligament adenopathy as previously noted, consistent with history of  gastric carcinoma. Patchy atelectasis or infiltrates posteriorly in both lung bases, new since previous. - 8/5 CT a/p:  Interval improvement in small bowel pneumatosis and small bowel dilatation. An increased amount of air is seen in the subcutaneous tissues of the anterior abdominal wall of the right lower quadrant as well as air seen extending into the anterior mediastinum in the visualized chest. No extravasation of contrast.  GI Surgeries / Procedures: - 7/24: diagnostic laparoscopy, open J tube placement, port-a-cath insertion, upper EUS  Central access: port-a-cath placed 7/24 TPN start date: 8/1  Nutritional Goals: Goal TPN rate is 90 mL/hr (provides 110 g of protein and 2219 kcals per day) 8/7: transition to cyclic TPN with 12 hr goal TPN run time  - goal 2.1L to run 95 ml/hr x 1 hr, 191 ml/hr x 10 hr, then 95 ml/hr x1 hr with 116 g protein and  2163 kcal  RD Assessment: Estimated Needs Total Energy Estimated Needs: 2150-2350 Total Protein Estimated Needs: 105-120g Total Fluid Estimated Needs: 2.1L/day RD also recommended thiamine 100mg  IV daily x 5 days due to risk for refeeding syndrome (completed)   Current Nutrition:  NPO except for ice chips TPN  Plan:   - unable to DC home today b/c no HH RN available - [facility] to compound TPNs for him today in case he is discharged home over the weekend - will decrease cycle rate from 18 hours to 16 hours with next bag of TPN at 1800 tonight - Electrolytes in TPN:  Na 39mEq/L K 53mEq/L Ca 35mEq/L Mg 9mEq/L Phos 14mmol/L Cl:Ac 1:1 - Add standard MVI and trace elements to TPN - Change SSI/CBGs checks for cyclic TPN - check at 2000, 2956 while  TPN is running, then 1 hr after TPN is off at 1100 while TPN runs and 1 CBG at 1600 while TPN is off  - 5 days Thiamine completed - Monitor TPN labs on Mon/Thurs and as needed - RN to educate pt on glucose finger sticks & MD to order glucometer on discharge  Herby Abraham, Pharm.D Use  secure chat for questions 11/01/2022 7:32 AM

## 2022-11-02 DIAGNOSIS — K311 Adult hypertrophic pyloric stenosis: Secondary | ICD-10-CM | POA: Diagnosis not present

## 2022-11-02 LAB — GLUCOSE, CAPILLARY
Glucose-Capillary: 178 mg/dL — ABNORMAL HIGH (ref 70–99)
Glucose-Capillary: 112 mg/dL — ABNORMAL HIGH (ref 70–99)
Glucose-Capillary: 88 mg/dL (ref 70–99)
Glucose-Capillary: 148 mg/dL — ABNORMAL HIGH (ref 70–99)

## 2022-11-02 MED ORDER — INSULIN ASPART 100 UNIT/ML IJ SOLN
0.0000 [IU] | Freq: Four times a day (QID) | INTRAMUSCULAR | Status: DC
  Administered 2022-11-02: 2 [IU] via SUBCUTANEOUS
  Administered 2022-11-03: 5 [IU] via SUBCUTANEOUS

## 2022-11-02 MED ORDER — TRAVASOL 10 % IV SOLN
INTRAVENOUS | Status: AC
  Filled 2022-11-02: qty 1155

## 2022-11-02 NOTE — Progress Notes (Signed)
PHARMACY - TOTAL PARENTERAL NUTRITION CONSULT NOTE   Indication:  Gastric Outlet Obstruction  Patient Measurements: Height: 6' (182.9 cm) Weight: 79.8 kg (175 lb 14.8 oz) IBW/kg (Calculated) : 77.6 TPN AdjBW (KG): 72.3 Body mass index is 23.86 kg/m. Usual Weight: 72kg  Assessment:  67 YO male with metastatic gastric cancer and outlet obstruction. Patient had J-tube placement 7/24 due to poor oral intake secondary to gastric outlet obstruction. He has been unable to advance tube feeds since J-tube placement. KUB from 7/31 showed air-fluid levels of the small bowel and CT scan showed pneumatosis in the area around the J-tube. TPN to start in the setting of gastric outlet obstruction and inability to advance enteral feeding.  Glucose / Insulin: No DM hx CBGs 199, 178, 195 during TPN rate of 124 mls/hr during 16 hr cycle, goal is < 180 Electrolytes: all WNL except Na 131 Renal: SCr <1, stable Hepatic:  AST/ALT, Tbili WNL. Alk phos 135 (8/10) Albumin low  TGs 148 (8/5) Intake / Output; MIVF:  Per RN, UOP likely not accurately documented (pt takes self to bathroom) GI Imaging: - 7/19 CT a/p: Gastric wall thickening, consistent with known gastric cancer. Perigastric fat stranding, concerning for local invasion. New prominent subcentimeter gastrohepatic ligament and left para-aortic lymph nodes, concerning for nodal metastatic disease. - 7/31 CT a/p: Interval placement of jejunostomy catheter into the proximal jejunum. Pneumatosis of proximal jejunum beginning just distal to the jejunostomy catheter, extending into the mesentery. Mid and distal small bowel loops are distended without wall thickening or pneumatosis. Small amount of free intraperitoneal gas and scattered abdominal and pelvic ascites, new since previous, potentially related to recent surgery. Persistent gastric wall thickening in the body and antrum with gastrohepatic ligament adenopathy as previously noted, consistent with history  of gastric carcinoma. Patchy atelectasis or infiltrates posteriorly in both lung bases, new since previous. - 8/5 CT a/p:  Interval improvement in small bowel pneumatosis and small bowel dilatation. An increased amount of air is seen in the subcutaneous tissues of the anterior abdominal wall of the right lower quadrant as well as air seen extending into the anterior mediastinum in the visualized chest. No extravasation of contrast.  GI Surgeries / Procedures: - 7/24: diagnostic laparoscopy, open J tube placement, port-a-cath insertion, upper EUS  Central access: port-a-cath placed 7/24 TPN start date: 8/1  Nutritional Goals: Goal TPN rate is 90 mL/hr (provides 110 g of protein and 2219 kcals per day) 8/7: transition to cyclic TPN with 12 hr goal TPN run time  - goal 2.1L to run 95 ml/hr x 1 hr, 191 ml/hr x 10 hr, then 95 ml/hr x1 hr with 116 g protein and 2163 kcal  RD Assessment: Estimated Needs Total Energy Estimated Needs: 2150-2350 Total Protein Estimated Needs: 105-120g Total Fluid Estimated Needs: 2.1L/day RD also recommended thiamine 100mg  IV daily x 5 days due to risk for refeeding syndrome (completed)   Current Nutrition:  NPO except for ice chips TPN  Plan:  - Patient cannot discharge until Sunday due to lack of HH nurse to administer TPN - [facility] to compound TPNs once patient is discharged - TPN started yesterday was scheduled to run over 16 hours, but the rate was not subsequently increased to accommodate this shorter duration. Will keep TPN duration at 16 hours and increase rate to 70 mL/hr during the first and last hours of TPN and 140 mL/hr x14 hours. Next bag of TPN at 1800 tonight.  - Electrolytes in TPN:  Na 65mEq/L (increased  from 42mEq/L on 8/10) K 16mEq/L Ca 76mEq/L Mg 8mEq/L Phos 72mmol/L Cl:Ac 1:1 - Add standard MVI and trace elements to TPN - Increase SSI to moderate scale four times daily - Continue CBG checks for cyclic TPN - check at 2000, 4098 while TPN  is running, then 1 hr after TPN is off at 1100 and 1 CBG at 1600 while TPN is off  - 5 days Thiamine completed - Monitor TPN labs on Mon/Thurs and as needed - RN to educate pt on glucose finger sticks & MD to order glucometer on discharge    Cherylin Mylar, PharmD Clinical Pharmacist  8/10/20249:08 AM

## 2022-11-02 NOTE — Progress Notes (Signed)
PROGRESS NOTE    Steven Wolf  GLO:[id number] DOB: [birth year] DOA: 10/14/2022 PCP: Pincus Sanes, MD   Brief Narrative: Steven Wolf is a 67 y.o. male with a history of gastric adenocarcinoma, hypertension, depression. Patient presented secondary to need for J-tube placement for poor oral intake secondary to gastric outlet obstruction. Post J-tube placement, patient with inability to advance tube feeds. General surgery consulted for concern of pnematosis. Patient continued to fail advancement of tube feed rate. Tube feeds held and TPN started on 8/1. Antibiotics started.    Assessment and Plan:  Gastric outlet obstruction Secondary to recently diagnosed gastric adenocarcinoma J-tube placed on 7/24, currently not tolerating advancement of tube feeds secondary to recurrent nausea and vomiting Continue TPN  Pneumatosis EC Fistula General surgery consulted, pneumatosis likely related to J-tube placement Repeat CT scan on 8/5 showed drainage from site consistent with fistula formation General surgery recommendations: NPO, stop PO meds and tube feeds, continue TPN Completed Cefepime/Flagyl  Gastric adenocarcinoma Patient follows with Dr. Mosetta Putt as an outpatient with possible plan to start immunotherapy as an outpt once discharged  Primary hypertension Irbesartan discontinued secondary to NPO status and inability to use J-tube Hydralazine IV PRN with parameters  GERD Continue Protonix  Depression Prozac held secondary to NPO status  Severe malnutrition TPN Dietician on board    DVT prophylaxis: Lovenox Code Status:   Code Status: Full Code Family Communication: None at bedside Disposition Plan: Discharge home with [facility] on 8/11     Consultants:  General surgery [facility] Medical oncology Palliative care medicine  Procedures:  7/24: EUS  Antimicrobials: Completed   Subjective: Denies any new complaints, except for mild N/V which has been  onging   Objective: BP (!) 121/96 (BP Location: Right Arm)   Pulse (!) 103   Temp 98.1 F (36.7 C) (Oral)   Resp 16   Ht 6' (1.829 m)   Wt 79.8 kg   SpO2 98%   BMI 23.86 kg/m   Examination: General: NAD  Cardiovascular: S1, S2 present Respiratory: CTAB Abdomen: Soft, nontender, nondistended, bowel sounds present Musculoskeletal: No bilateral pedal edema noted Skin: Normal Psychiatry: Normal mood     Data Reviewed: I have personally reviewed following labs and imaging studies  CBC Lab Results  Component Value Date   WBC 10.6 (H) 11/02/2022   RBC 3.80 (L) 11/02/2022   HGB 11.4 (L) 11/02/2022   HCT 35.7 (L) 11/02/2022   MCV 93.9 11/02/2022   MCH 30.0 11/02/2022   PLT 296 11/02/2022   MCHC 31.9 11/02/2022   RDW 13.6 11/02/2022   LYMPHSABS 0.6 (L) 11/02/2022   MONOABS 1.0 11/02/2022   EOSABS 0.1 11/02/2022   BASOSABS 0.1 11/02/2022     Last metabolic panel Lab Results  Component Value Date   NA 131 (L) 11/02/2022   K 4.3 11/02/2022   CL 99 11/02/2022   CO2 24 11/02/2022   BUN 26 (H) 11/02/2022   CREATININE 0.76 11/02/2022   GLUCOSE 195 (H) 11/02/2022   GFRNONAA >60 11/02/2022   GFRAA 73 12/13/2019   CALCIUM 7.8 (L) 11/02/2022   PHOS 3.0 10/31/2022   PROT 5.0 (L) 11/02/2022   ALBUMIN 2.1 (L) 11/02/2022   BILITOT 0.7 11/02/2022   ALKPHOS 135 (H) 11/02/2022   AST 19 11/02/2022   ALT 22 11/02/2022   ANIONGAP 8 11/02/2022    GFR: Estimated Creatinine Clearance: 95.7 mL/min (by C-G formula based on SCr of 0.76 mg/dL).  No results found for this or any  previous visit (from the past 240 hour(s)).    Radiology Studies: No results found.    LOS: 18 days    Briant Cedar, MD [facility] 11/02/2022, 2:40 PM   If 7PM-7AM, please contact night-coverage [email or web address]

## 2022-11-02 NOTE — Plan of Care (Signed)
  Problem: Education: Goal: Knowledge of General Education information will improve Description: Including pain rating scale, medication(s)/side effects and non-pharmacologic comfort measures Outcome: Progressing   Problem: Health Behavior/Discharge Planning: Goal: Ability to manage health-related needs will improve Outcome: Progressing   Problem: Clinical Measurements: Goal: Ability to maintain clinical measurements within normal limits will improve Outcome: Progressing   Problem: Clinical Measurements: Goal: Will remain free from infection Outcome: Progressing   Problem: Clinical Measurements: Goal: Will remain free from infection Outcome: Progressing   Problem: Clinical Measurements: Goal: Diagnostic test results will improve Outcome: Progressing   Problem: Nutrition: Goal: Adequate nutrition will be maintained Outcome: Progressing

## 2022-11-02 NOTE — Progress Notes (Signed)
17 Days Post-Op   Subjective/Chief Complaint: Minimal output from ECF. Some nausea, no vomiting. Afebrile.   Objective: Vital signs in last 24 hours: Temp:  [98 F (36.7 C)-98.2 F (36.8 C)] 98 F (36.7 C) (08/10 0540) Pulse Rate:  [99-110] 99 (08/10 0540) Resp:  [16-20] 16 (08/10 0540) BP: (112-128)/(84-97) 120/97 (08/10 0540) SpO2:  [96 %-97 %] 96 % (08/10 0540) Weight:  [79.8 kg] 79.8 kg (08/10 0540) Last BM Date : 10/29/22  Intake/Output from previous day: 08/09 0701 - 08/10 0700 In: 1073.8 [I.V.:873.8; IV Piggyback:200] Out: 1475 [Urine:1475] Intake/Output this shift: No intake/output data recorded.  Alert and oriented, NAD Nonlabored respirations on room air Incision is intact.  Eakin pouch in place over inferior aspect of wound with small volume bilious drainage.  J-tube site dry without leak. Abdomen soft and nontender.  Lab Results:  Recent Labs    11/01/22 0710 11/02/22 0624  WBC 10.8* 10.6*  HGB 11.1* 11.4*  HCT 34.8* 35.7*  PLT 289 296   BMET Recent Labs    11/01/22 0710 11/02/22 0624  NA 133* 131*  K 4.3 4.3  CL 99 99  CO2 27 24  GLUCOSE 171* 195*  BUN 24* 26*  CREATININE 0.65 0.76  CALCIUM 8.1* 7.8*   PT/INR No results for input(s): "LABPROT", "INR" in the last 72 hours. ABG No results for input(s): "PHART", "HCO3" in the last 72 hours.  Invalid input(s): "PCO2", "PO2"    Assessment/Plan: s/p Procedure(s): DIAGNOSTIC LAPAROSCOPY, OPEN J TUBE PLACEMENT (N/A) INSERTION PORT-A-CATH (N/A) Bilious drainage noted from his wound starting on Saturday 8/3. CT scan repeated 8/5 with IV and PO contrast (per J tube). Interval improvement in small bowel pneumatosis and small bowel dilation. Increased amount of free are in the subcutaneous tissues of the abdominal wall and persistent free air in the abdomen.no extravasation of contrast.  - Patient is hemodynamically stable, non-toxic appearing, no peritonitis. 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 Output is very low, continue to monitor. Do not use J tube for feeds. - Remain NPO with TPN, ok for ice chips. - Appreciate oncology following. They plan to start immunotherapy after d/c.  - Appreciate palliative following and input.  - No need for further antibiotics. - stable for discharge with home health service, home health/TPN from [facility] standpoint. Do not use J tube. Follow up with Dr. Donell Beers in one week has been arranged. Discussed lifting restrictions, showering, driving, etc with patient. All questions welcomed and answered.   LOS: 18 days    Steven Wolf 11/02/2022

## 2022-11-03 DIAGNOSIS — I10 Essential (primary) hypertension: Secondary | ICD-10-CM | POA: Diagnosis not present

## 2022-11-03 DIAGNOSIS — Z434 Encounter for attention to other artificial openings of digestive tract: Secondary | ICD-10-CM | POA: Diagnosis not present

## 2022-11-03 DIAGNOSIS — G893 Neoplasm related pain (acute) (chronic): Secondary | ICD-10-CM | POA: Diagnosis not present

## 2022-11-03 DIAGNOSIS — K632 Fistula of intestine: Secondary | ICD-10-CM | POA: Diagnosis not present

## 2022-11-03 DIAGNOSIS — E43 Unspecified severe protein-calorie malnutrition: Secondary | ICD-10-CM | POA: Diagnosis not present

## 2022-11-03 DIAGNOSIS — K311 Adult hypertrophic pyloric stenosis: Secondary | ICD-10-CM | POA: Diagnosis not present

## 2022-11-03 DIAGNOSIS — Z452 Encounter for adjustment and management of vascular access device: Secondary | ICD-10-CM | POA: Diagnosis not present

## 2022-11-03 DIAGNOSIS — C169 Malignant neoplasm of stomach, unspecified: Secondary | ICD-10-CM | POA: Diagnosis not present

## 2022-11-03 LAB — COMPREHENSIVE METABOLIC PANEL WITH GFR
ALT: 23 U/L (ref 0–44)
AST: 21 U/L (ref 15–41)
Albumin: 2.2 g/dL — ABNORMAL LOW (ref 3.5–5.0)
Alkaline Phosphatase: 140 U/L — ABNORMAL HIGH (ref 38–126)
Anion gap: 9 (ref 5–15)
BUN: 25 mg/dL — ABNORMAL HIGH (ref 8–23)
CO2: 25 mmol/L (ref 22–32)
Calcium: 8.2 mg/dL — ABNORMAL LOW (ref 8.9–10.3)
Chloride: 98 mmol/L (ref 98–111)
Creatinine, Ser: 0.65 mg/dL (ref 0.61–1.24)
GFR, Estimated: >60 mL/min (ref >60)
Glucose, Bld: 217 mg/dL — ABNORMAL HIGH (ref 70–99)
Potassium: 4.2 mmol/L (ref 3.5–5.1)
Sodium: 132 mmol/L — ABNORMAL LOW (ref 135–145)
Total Bilirubin: 0.5 mg/dL (ref 0.3–1.2)
Total Protein: 5.3 g/dL — ABNORMAL LOW (ref 6.5–8.1)

## 2022-11-03 LAB — GLUCOSE, CAPILLARY
Glucose-Capillary: 190 mg/dL — ABNORMAL HIGH (ref 70–99)
Glucose-Capillary: 194 mg/dL — ABNORMAL HIGH (ref 70–99)
Glucose-Capillary: 213 mg/dL — ABNORMAL HIGH (ref 70–99)

## 2022-11-03 MED ORDER — INSULIN ASPART 100 UNIT/ML IJ SOLN: 0.0000 [IU] | Freq: Four times a day (QID) | INTRAMUSCULAR | 0 refills | Status: DC

## 2022-11-03 MED ORDER — PANTOPRAZOLE SODIUM 40 MG IV SOLR: 40.0000 mg | Freq: Every day | INTRAVENOUS | 0 refills | Status: AC

## 2022-11-03 MED ORDER — BLOOD GLUCOSE MONITOR KIT: PACK | 0 refills | Status: DC

## 2022-11-03 NOTE — TOC Transition Note (Signed)
Transition of Care [facility]) - CM/SW Discharge Note   Patient Details  Name: Steven Wolf MRN: [id number] Date of Birth: 12-Jan-1956  Transition of [facility]) CM/SW Contact:  Georgie Chard, LCSW Phone Number: 11/03/2022, 10:33 AM   Clinical Narrative:    CSW has confirmed with [facility] that patient is ready for DC and they have all the patient's care plan in place. TOC will sign of.    Final next level of care: Home w Home Health Services Barriers to Discharge: No Barriers Identified   Patient Goals and CMS Choice CMS Medicare.gov Compare Post Acute Care list provided to:: Patient Choice offered to / list presented to : Spouse, Patient  Discharge Placement                  Patient to be transferred to facility by: Family   Patient and family notified of of transfer: 11/03/22  Discharge Plan and Services Additional resources added to the After Visit Summary for       Post Acute Care Choice: Home Health                    HH Arranged: RN [facility] Agency: [facility] Date [facility] Agency Contacted: 11/03/22 Time HH Agency Contacted: [phone] Representative spoke with at [facility] [employer] [facility] Agency: Denyse Amass  Social Determinants of Health (SDOH) Interventions SDOH Screenings   Food Insecurity: No Food Insecurity (10/14/2022)  Housing: Low Risk  (10/14/2022)  Transportation Needs: No Transportation Needs (10/14/2022)  Utilities: Not At Risk (10/14/2022)  Depression (PHQ2-9): Medium Risk (08/13/2022)  Financial Resource Strain: Low Risk  (08/12/2022)  Physical Activity: Sufficiently Active (08/12/2022)  Social Connections: Unknown (08/12/2022)  Stress: Stress Concern Present (08/12/2022)  Tobacco Use: Low Risk  (10/16/2022)     Readmission Risk Interventions    10/30/2022    2:51 PM  Readmission Risk Prevention Plan  Transportation Screening Complete  PCP or Specialist Appt within 5-7 Days Complete  Home Care Screening Complete  Medication Review (RN CM) Referral to Pharmacy

## 2022-11-03 NOTE — Plan of Care (Signed)

## 2022-11-03 NOTE — Discharge Summary (Signed)
Physician Discharge Summary   Patient: Steven Wolf MRN: [id number] DOB: 04/14/1955  Admit date:     10/14/2022  Discharge date: 11/03/22  Discharge Physician: Briant Cedar   PCP: Pincus Sanes, MD   Recommendations at discharge:    Follow up with PCP Follow up with General surgery Follow up with Oncology   Discharge Diagnoses: Principal Problem:   Partial gastric outlet obstruction Active Problems:   Essential hypertension, benign   BPH (benign prostatic hyperplasia)   Anxiety   Adenocarcinoma of stomach (HCC)   Gastric outlet obstruction   Protein-calorie malnutrition, severe   Palliative care encounter   Nausea and vomiting   Constipation   Cancer associated pain   Need for emotional support   Goals of care, counseling/discussion   High risk medication use   Medication management    Hospital Course: SAYAN GIESER is a 67 y.o. male with a history of gastric adenocarcinoma, hypertension, depression. Patient presented secondary to need for J-tube placement for poor oral intake secondary to gastric outlet obstruction. Post J-tube placement, patient with inability to advance tube feeds. General surgery consulted for concern of pnematosis. Patient continued to fail advancement of tube feed rate. Tube feeds held and TPN started on 8/1.   Today, pt denies any new complaints. Discussed discharge plans with patients wife and sister. HH arranged    Assessment and Plan:  Gastric outlet obstruction Secondary to recently diagnosed gastric adenocarcinoma J-tube placed on 7/24, currently not in use as per gen surg Continue TPN   Pneumatosis EC Fistula General surgery consulted, pneumatosis likely related to J-tube placement Repeat CT scan on 8/5 showed drainage from site consistent with fistula formation General surgery recommendations: NPO, stop PO meds and tube feeds, continue TPN Completed Cefepime/Flagyl   Gastric adenocarcinoma Patient follows with  Dr. Mosetta Putt as an outpatient with possible plan to start immunotherapy as an outpt   Primary hypertension Irbesartan discontinued secondary to NPO status and inability to use J-tube   GERD   Depression Prozac held secondary to NPO status   Severe malnutrition TPN          Consultants: Gen surg, Oncology Procedures performed: None Disposition: Home health Diet recommendation: NPO  DISCHARGE MEDICATION: Allergies as of 11/03/2022       Reactions   Ramipril Other (See Comments)   Elevated liver enzymes   Penicillins Other (See Comments)   ? Reaction (as child)        Medication List     STOP taking these medications    Ensure Complete Shake Liqd   FLUoxetine 20 MG tablet Commonly known as: PROZAC   omeprazole 20 MG capsule Commonly known as: PRILOSEC   ondansetron 4 MG tablet Commonly known as: Zofran   prochlorperazine 10 MG tablet Commonly known as: COMPAZINE   senna 8.6 MG Tabs tablet Commonly known as: SENOKOT   valsartan 80 MG tablet Commonly known as: DIOVAN       TAKE these medications    blood glucose meter kit and supplies Kit Dispense based on patient and insurance preference. Use up to four times daily as directed.   insulin aspart 100 UNIT/ML injection Commonly known as: novoLOG Inject 0-15 Units into the skin 4 (four) times daily. Correction coverage: Moderate (average weight, post-op)  CBG < 70: Implement Hypoglycemia Standing Orders and refer to Hypoglycemia Standing Orders sidebar report  CBG 70 - 120:             0 units  CBG  121 - 150: 2 units  CBG 151 - 200: 3 units  CBG 201 - 250: 5 units  CBG 251 - 300: 8 units  CBG 301 - 350: 11 units  CBG 351 - 400: 15 units  CBG > 400 call MD   pantoprazole 40 MG injection Commonly known as: PROTONIX Inject 40 mg into the vein daily.   PRESCRIPTION MEDICATION CPAP- At bedtime as determined by the patient        Follow-up Information     [facility] Follow up.    Specialty: Home Health Services Why: Will follow you at home with RN Contact information: [address] [place] [address] [phone]         [facility] Follow up.   Why: This company will follow for tube feeding [facility], [phone].        Almond Lint, MD. Go on 11/08/2022.   Specialty: General Surgery Why: Your appointment is 8/16 at 11:15am Arrive early to check in, fill out paperwork, Bring photo ID and insurance information Contact information: [address] [place] [address] [phone]         [name], MD. Go on 11/12/2022.   Specialties: Hematology, Oncology Contact information: [address] [place] [address] [phone]         Pincus Sanes, MD. Schedule an appointment as soon as possible for a visit in 1 week(s).   Specialty: Internal Medicine Contact information: [address] [place] [address] [phone]                Discharge Exam: Ceasar Mons Weights   11/01/22 6433 11/02/22 0540 11/03/22 0500  Weight: 79.8 kg 79.8 kg 79.9 kg   General: NAD  Cardiovascular: S1, S2 present Respiratory: CTAB Abdomen: Soft, nontender, nondistended, bowel sounds present Musculoskeletal: No bilateral pedal edema noted Skin: Normal Psychiatry: Normal mood   Condition at discharge: stable  The results of significant diagnostics from this hospitalization (including imaging, microbiology, ancillary and laboratory) are listed below for reference.   Imaging Studies: CT ABDOMEN PELVIS W CONTRAST  Result Date: 10/28/2022 CLINICAL DATA:  Postoperative abdominal pain. EXAM: CT ABDOMEN AND PELVIS WITH CONTRAST TECHNIQUE: Multidetector CT imaging of the abdomen and pelvis was performed using the standard protocol following bolus administration of intravenous contrast. RADIATION DOSE REDUCTION: This exam was performed according to the departmental dose-optimization program which includes  automated exposure control, adjustment of the mA and/or kV according to patient size and/or use of iterative reconstruction technique. CONTRAST:  OMNIPAQUE IOHEXOL 300 MG/ML SOLN, OMNIPAQUE IOHEXOL 300 MG/ML SOLN COMPARISON:  October 23, 2022. FINDINGS: Lower chest: Mild bibasilar subsegmental atelectasis. Gas is now seen in the anterior mediastinal region. Hepatobiliary: Stable gallbladder distention. No biliary dilatation is noted. No cholelithiasis is noted. Stable hepatic cysts. Pancreas: Unremarkable. No pancreatic ductal dilatation or surrounding inflammatory changes. Spleen: Normal in size without focal abnormality. Adrenals/Urinary Tract: Adrenal glands are unremarkable. Bilateral renal cysts are noted for which no further follow-up is required. No hydronephrosis or renal obstruction. Urinary bladder is unremarkable. Stomach/Bowel: Continued gastric wall thickening and body and antrum consistent with history of gastric carcinoma. Jejunostomy tube is again noted. Mildly dilated small bowel loops are noted in the right lower quadrant with mild wall thickening, with this appears to be overall improved compared to prior exam. No colonic dilatation is noted. The pneumatosis noted on prior exam is nearly resolved, although there remains a large amount of free  air both adjacent to the jejunal loops as well as in the epigastric region, which potentially is postsurgical in etiology. Status post appendectomy. There is no definite evidence of leakage of contrast. Vascular/Lymphatic: No significant vascular abnormality is noted. 8 mm gastrohepatic lymph node is noted suggesting metastatic disease. Reproductive: Stable mild prostatic enlargement. Other: Mild ascites is noted. No hernia is noted. Free air is noted in the anterior abdominal wall which is increased compared to prior exam. Musculoskeletal: No acute or significant osseous findings. IMPRESSION: Continued gastric wall thickening of body and antrum  consistent with history of gastric carcinoma. 8 mm gastrohepatic lymph node is also noted suggesting metastatic disease. Continued presence of jejunostomy tube. Pneumatosis involving jejunum on prior exam is nearly resolved, and there is significantly decreased small bowel dilatation compared to prior exam as well. However, there remains a large amount of free air adjacent to small bowel loops and in the mesentery as well as in the epigastric region. An increased amount of air is seen in the subcutaneous tissues of the anterior abdominal wall of the right lower quadrant as well as air seen extending into the anterior mediastinum in the visualized chest. Mild ascites is noted. While this free air may be postsurgical in etiology, the possibility of bowel perforation still cannot be excluded. However, there is no definite evidence of extravasated contrast on this exam. These results will be called to the ordering clinician or representative by the Radiologist Assistant, and communication documented in the PACS or [employer] Dashboard. Electronically Signed   By: Lupita Raider M.D.   On: 10/28/2022 10:43   CT ABDOMEN PELVIS W CONTRAST  Result Date: 10/23/2022 CLINICAL DATA:  Bowel obstruction suspected (Ped 5-17y) concern for pnuemotosis EXAM: CT ABDOMEN AND PELVIS WITH CONTRAST TECHNIQUE: Multidetector CT imaging of the abdomen and pelvis was performed using the standard protocol following bolus administration of intravenous contrast. RADIATION DOSE REDUCTION: This exam was performed according to the departmental dose-optimization program which includes automated exposure control, adjustment of the mA and/or kV according to patient size and/or use of iterative reconstruction technique. CONTRAST:  OMNIPAQUE IOHEXOL 300 MG/ML  SOLN COMPARISON:  10/11/2022 FINDINGS: Lower chest: No pleural or pericardial effusion. Catheter extends to the proximal right atrium. Patchy atelectasis or infiltrates posteriorly in both  lung bases, new since previous. Hepatobiliary: Stable probable cyst in hepatic segment 2. No new liver lesion or biliary ductal dilatation. Gallbladder is distended, without definite wall thickening. Pancreas: Unremarkable. No pancreatic ductal dilatation or surrounding inflammatory changes. Spleen: Normal in size without focal abnormality. Adrenals/Urinary Tract: No adrenal mass. Symmetric renal enhancement without hydronephrosis or worrisome lesion. Urinary bladder physiologically distended. Stomach/Bowel: Stomach is distended by gas and fluid. Despite that, there is wall thickening in the body and antrum as seen on prior study. The duodenum is distended. Multiple gas dilated loops of small bowel. Interval placement of jejunostomy catheter into the proximal jejunum. There is pneumatosis of proximal jejunum beginning just distal to the jejunostomy catheter and involving several loops. There is gas and fluid distending mid and distal small bowel, without other pneumatosis or wall thickening. Terminal ileum is nondilated. Ascending colon is partially distended, distal segments decompressed. Scattered distal descending and sigmoid diverticula are noted. Vascular/Lymphatic: Minimal plaque in the abdominal aorta without dissection, aneurysm, or stenosis. Portal vein patent. Gastrohepatic ligament adenopathy as previously noted. No other mesenteric, retroperitoneal, or pelvic adenopathy. Reproductive: Prostate enlargement Other: There is a small amount of free intraperitoneal intraperitoneal gas, and scattered abdominal and pelvic  ascites, new since previous. Musculoskeletal: No acute or significant osseous findings. IMPRESSION: 1. Interval placement of jejunostomy catheter into the proximal jejunum. 2. Pneumatosis of proximal jejunum beginning just distal to the jejunostomy catheter, extending into the mesentery. Mid and distal small bowel loops are distended without wall thickening or pneumatosis. 3. Small amount of  free intraperitoneal gas and scattered abdominal and pelvic ascites, new since previous, potentially related to recent surgery. Critical Value/emergent results were called by telephone at the time of interpretation on 10/23/2022 at 10:36 pm to provider Jeanie Sewer, who verbally acknowledged these results. 4. Persistent gastric wall thickening in the body and antrum with Gastrohepatic ligament adenopathy as previously noted, consistent with history of gastric carcinoma. 5. Patchy atelectasis or infiltrates posteriorly in both lung bases, new since previous. 6. Prostate enlargement. 7.  Aortic Atherosclerosis (ICD10-I70.0). Electronically Signed   By: Corlis Leak M.D.   On: 10/23/2022 22:38   DG Abd 1 View  Result Date: 10/23/2022 CLINICAL DATA:  Nausea vomiting EXAM: ABDOMEN - 1 VIEW COMPARISON:  10/19/2022, CT 10/11/2022 FINDINGS: Persistent multiple loops of dilated small bowel, measuring up to 5.1 cm. Abnormal appearing loop of small bowel in the left lower quadrant with an appearance suggesting intramural air. Probable left lower quadrant jejunostomy tube with tip in the left mid abdomen. IMPRESSION: Persistent multiple loops of dilated small bowel suspicious for small bowel obstruction. Abnormal appearing loop of small bowel in the left lower quadrant with an appearance suggesting intramural air. CT is recommended for further evaluation. Critical Value/emergent results were called by telephone at the time of interpretation on 10/23/2022 at 5:40 pm to provider Dr. Caleb Popp, Who verbally acknowledged these results. Electronically Signed   By: Jasmine Pang M.D.   On: 10/23/2022 17:41   DG Abd 1 View  Result Date: 10/19/2022 CLINICAL DATA:  Bowel obstruction. EXAM: ABDOMEN - 1 VIEW COMPARISON:  CT 10/11/2022 FINDINGS: Catheter overlying the left hemiabdomen. Multiple dilated air-filled loops of bowel again identified. Colonic loops measuring up to 7.8 cm and small bowel loops of 5.1 cm. Ileus versus developing  obstruction is possible. No obvious free air seen beneath the diaphragm is clipped off the edge of the film. Again please correlate with patient's CT scan describing gastric pathology. IMPRESSION: Catheter in the left midabdomen. Dilated loops of small and large bowel diffusely. Ileus versus obstruction. Recommend close follow-up or additional workup as clinically appropriate Electronically Signed   By: Karen Kays M.D.   On: 10/19/2022 18:29   DG CHEST PORT 1 VIEW  Result Date: 10/17/2022 CLINICAL DATA:  [id number] Port-A-Cath in place [id number] EXAM: PORTABLE CHEST 1 VIEW COMPARISON:  10/16/2022. FINDINGS: There is new free air under the domes of diaphragm. Correlate with history. Redemonstration of patient's known pre-existing pneumomediastinum. There are atelectatic changes at the lung bases, left more than right. Apparent blunting of left lateral costophrenic angle is nonspecific and may be due to overlying lung parenchymal opacity versus small pleural effusion. Right lateral costophrenic angle is clear. Stable cardio-mediastinal silhouette. No acute osseous abnormalities. The soft tissues are within normal limits. Left-sided CT Port-A-Cath is again seen with its tip overlying the cavoatrial junction region. IMPRESSION: *New pneumoperitoneum. *Redemonstration of pre-existing pneumomediastinum. No discrete pneumothorax seen. Critical Value/emergent results were called by telephone at the time of interpretation on 10/17/2022 at 8:22 am to provider Dr. Donell Beers , who verbally acknowledged these results. Electronically Signed   By: Jules Schick M.D.   On: 10/17/2022 08:26   DG CHEST PORT 1 VIEW  Result Date: 10/16/2022 CLINICAL DATA:  Port-A-Cath in place EXAM: PORTABLE CHEST 1 VIEW COMPARISON:  Chest x-ray September 03, 2013 FINDINGS: Curvilinear lucencies along the margins of the mediastinum. No visible pneumothorax on this semi erect radiograph. No consolidation. Cardiomediastinal silhouette is within normal limits.  Left subclavian approach Port-A-Cath with the tip projecting at the superior right atrium IMPRESSION: 1. Interval development of curvilinear lucencies along the margins of the mediastinum, which are are suspicious for pneumomediastinum. Less likely pneumopericardium. No visible pneumothorax on this semi erect radiograph. CT of the chest could further characterize if clinically warranted. 2. Left subclavian approach Port-A-Cath with the tip projecting at the superior right atrium. These results will be called to the ordering clinician or representative by the Radiologist Assistant, and communication documented in the PACS or [employer]. Electronically Signed   By: Feliberto Harts M.D.   On: 10/16/2022 17:54   DG C-Arm 1-60 Min-No Report  Result Date: 10/16/2022 Fluoroscopy was utilized by the requesting physician.  No radiographic interpretation.   CT CHEST ABDOMEN PELVIS W CONTRAST  Result Date: 10/14/2022 CLINICAL DATA:  Gastric cancer; * Tracking Code: BO * EXAM: CT CHEST, ABDOMEN, AND PELVIS WITH CONTRAST TECHNIQUE: Multidetector CT imaging of the chest, abdomen and pelvis was performed following the standard protocol during bolus administration of intravenous contrast. RADIATION DOSE REDUCTION: This exam was performed according to the departmental dose-optimization program which includes automated exposure control, adjustment of the mA and/or kV according to patient size and/or use of iterative reconstruction technique. CONTRAST:  OMNIPAQUE IOHEXOL 300 MG/ML  SOLN COMPARISON:  CT abdomen and pelvis dated December 01, 2011 FINDINGS: CT CHEST FINDINGS Cardiovascular: Normal heart size. No pericardial effusion. Normal caliber thoracic aorta with mild atherosclerotic disease. Mediastinum/Nodes: Esophagus and thyroid are unremarkable. No enlarged lymph nodes seen in the chest. Lungs/Pleura: Central airways are patent. No consolidation, pleural effusion or pneumothorax. Musculoskeletal: No chest  wall mass or suspicious bone lesions identified. CT ABDOMEN PELVIS FINDINGS Hepatobiliary: Low-attenuation lesion of the liver dome was present on 2013 prior and consistent with a simple cyst. No suspicious liver lesions. Gallbladder is unremarkable. No biliary ductal dilation. Pancreas: Unremarkable. No pancreatic ductal dilatation or surrounding inflammatory changes. Spleen: Normal in size without focal abnormality. Adrenals/Urinary Tract: Bilateral adrenal glands are unremarkable. Hydronephrosis or nephrolithiasis. Bilateral low-attenuation renal lesions are seen which are too small to accurately characterize but likely simple cysts. Bladder is unremarkable. Stomach/Bowel: Wall thickening of the cardia, gastric body and antrum of the stomach with adjacent fat stranding. Mild diverticulosis. No evidence of obstruction. Vascular/Lymphatic: Mild aortic atherosclerosis. Prominent subcentimeter gastrohepatic ligament lymph nodes reference node measuring 7 mm in short axis on series 2, image 60, new when compared with 2013 prior. Prominent subcentimeter left para-aortic lymph node measuring 8 mm on series 2, image 70, new when compared with 2013 prior. Reproductive: Prostatomegaly. Other: Trace free fluid in the pelvis. Musculoskeletal: Tiny sclerotic lesions of the right sacrum and left iliac bone, present on 2018 prior and consistent with bone islands. No aggressive appearing osseous lesions. IMPRESSION: 1. Gastric wall thickening, consistent with known gastric cancer. Perigastric fat stranding, concerning for local invasion. 2. New prominent subcentimeter gastrohepatic ligament and left para-aortic lymph nodes, concerning for nodal metastatic disease. 3. Mild aortic Atherosclerosis (ICD10-I70.0). Electronically Signed   By: Allegra Lai M.D.   On: 10/14/2022 08:28    Microbiology: No results found for this or any previous visit.  Labs: CBC: Recent Labs  Lab 10/29/22 0617 10/31/22 0645 11/01/22 0710  11/02/22 0624  WBC 9.1 10.7* 10.8* 10.6*  NEUTROABS  --  8.3* 8.4* 8.5*  HGB 12.1* 11.3* 11.1* 11.4*  HCT 37.5* 34.7* 34.8* 35.7*  MCV 92.4 93.3 92.1 93.9  PLT 224 276 289 296   Basic Metabolic Panel: Recent Labs  Lab 10/28/22 0549 10/30/22 0514 10/31/22 0645 11/01/22 0710 11/02/22 0624 11/03/22 0553  NA 138 135 134* 133* 131* 132*  K 3.9 4.2 4.2 4.3 4.3 4.2  CL 104 103 102 99 99 98  CO2 27 26 26 27 24 25   GLUCOSE 153* 169* 157* 171* 195* 217*  BUN 26* 24* 24* 24* 26* 25*  CREATININE 0.79 0.62 0.63 0.65 0.76 0.65  CALCIUM 8.0* 8.1* 8.1* 8.1* 7.8* 8.2*  MG 2.1 2.1 2.1  --   --   --   PHOS 3.3 3.1 3.0  --   --   --    Liver Function Tests: Recent Labs  Lab 10/30/22 0514 10/31/22 0645 11/01/22 0710 11/02/22 0624 11/03/22 0553  AST 17 19 21 19 21   ALT 21 22 23 22 23   ALKPHOS 145* 134* 144* 135* 140*  BILITOT 0.6 0.7 0.6 0.7 0.5  PROT 5.1* 4.9* 5.3* 5.0* 5.3*  ALBUMIN 2.2* 2.1* 2.1* 2.1* 2.2*   CBG: Recent Labs  Lab 11/02/22 1641 11/02/22 2033 11/03/22 0000 11/03/22 0426 11/03/22 0748  GLUCAP 88 148* 190* 213* 194*    Discharge time spent: greater than 30 minutes.  Signed: Briant Cedar, MD [facility] 11/03/2022

## 2022-11-03 NOTE — Progress Notes (Signed)
Daily Progress Note   Patient Name: Steven Wolf       Date: 11/03/2022 DOB: [birth year]  Age: 67 y.o. MRN#: [id number] Attending Physician: Briant Cedar, MD Primary Care Physician: Pincus Sanes, MD Admit Date: 10/14/2022 Length of Stay: 19 days  Reason for Consultation/Follow-up: Establishing goals of care and symptom management  Subjective:   CC: Patient continues to receive TPN  Following up regarding complex medical decision making and symptom management.   Subjective:  Reviewed EMR prior to presenting to bedside.  Sister and brother-in-law at bedside, anticipating discharge today   All questions answered at this time to the best of my ability.    Review of Systems Minimal vomiting  Objective:   Vital Signs:  BP 113/74 (BP Location: Right Arm)   Pulse 93   Temp 97.8 F (36.6 C) (Oral)   Resp 18   Ht 6' (1.829 m)   Wt 79.9 kg   SpO2 96%   BMI 23.89 kg/m   Physical Exam: General: NAD, alert, laying in bed, pleasant , chronically ill appearing   no increased work of breathing noted, not in respiratory distress Abdomen less distended Neuro: A&Ox4, following commands easily Psych: appropriately answers all questions  Imaging:  I personally reviewed recent imaging.   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 Since admission, patient has received continued workup through oncology, GI, and surgery. Patient underwent J-tube placement for management of tube feeds in setting of gastric obstruction. Plan is for patient to start palliative immunotherapy after hospitalization. Palliative  medicine team consulted to assist with complex medical decision making and symptom management.   Recommendations/Plan: # Complex medical decision making/goals of care:      -Continues open discussions with his care team regarding pathways for medical care moving forward.  Currently plan is for patient to receive palliative immunotherapy in the outpatient setting.                -  Code Status: Full Code                                    # Symptom management                -Pain, abdominal in the setting of metastatic gastric cancer with peritoneal mets and gastric outlet obstruction                                                               -Continue IV Dilaudid to 0.5-1 mg every 2 hours as needed for pain: asked patient if he needs sublingual Dilaudid for home, patient has not been using IV opioids, will not need opioids on discharge, recommend palliative follow-up at cancer center to see if patient needs them in the outpatient setting.                  -Nausea/vomiting, in setting  of metastatic gastric cancer with peritoneal mets and gastric outlet obstruction                               -Continue Zofran 4 mg every 6 hours as needed                               -Continue Compazine 5 mg every 6 hours as needed                               -EKG on 7/30 showed QTc of 452.                   -Constipation     Continue bowel regimen                               -Encourage ambulation as able    -Anxiety     -Continue IV Ativan 0.5mg  q4hrs prn, not been using that much at all, recommend consideration for sublingual Ativan in the outpatient setting once the patient follows up at [facility].  # Psycho-social/Spiritual Support:  - Support System: wife, sister-in-law   # Discharge Planning:  To Be Determined                -If continues current medical pathway, will need outpatient palliative medicine follow referral to [facility]. Informed patient of this.  Did not place  referral at this time as awaiting further medical outcomes.  Informed patient and family today about my colleague Ms. Cousar, NP who is in palliative medicine at [facility] health cancer [facility].  Discussed with: patient, patient's sister and brother-in-law.  Thank you for allowing the palliative care team to participate in the care Margarita Rana.  Mod MDM Rosalin Hawking MD.  Palliative Care Provider PMT # [phone]  If patient remains symptomatic despite maximum doses, please call PMT at [phone] between 0700 and 1900. Outside of these hours, please call attending, as PMT does not have night coverage.  *Please note that this is a verbal dictation therefore any spelling or grammatical errors are due to the "[employer]agon [employer] One" system interpretation.

## 2022-11-04 ENCOUNTER — Encounter: Payer: Self-pay | Admitting: *Deleted

## 2022-11-04 ENCOUNTER — Telehealth: Payer: Self-pay | Admitting: Hematology

## 2022-11-04 ENCOUNTER — Encounter: Payer: Self-pay | Admitting: Hematology

## 2022-11-04 ENCOUNTER — Other Ambulatory Visit (HOSPITAL_COMMUNITY): Payer: Self-pay

## 2022-11-04 ENCOUNTER — Telehealth: Payer: Self-pay | Admitting: *Deleted

## 2022-11-04 DIAGNOSIS — K311 Adult hypertrophic pyloric stenosis: Secondary | ICD-10-CM | POA: Diagnosis not present

## 2022-11-04 DIAGNOSIS — I10 Essential (primary) hypertension: Secondary | ICD-10-CM | POA: Diagnosis not present

## 2022-11-04 DIAGNOSIS — E43 Unspecified severe protein-calorie malnutrition: Secondary | ICD-10-CM | POA: Diagnosis not present

## 2022-11-04 DIAGNOSIS — K5730 Diverticulosis of large intestine without perforation or abscess without bleeding: Secondary | ICD-10-CM | POA: Diagnosis not present

## 2022-11-04 DIAGNOSIS — Z452 Encounter for adjustment and management of vascular access device: Secondary | ICD-10-CM | POA: Diagnosis not present

## 2022-11-04 DIAGNOSIS — Z434 Encounter for attention to other artificial openings of digestive tract: Secondary | ICD-10-CM | POA: Diagnosis not present

## 2022-11-04 DIAGNOSIS — K632 Fistula of intestine: Secondary | ICD-10-CM | POA: Diagnosis not present

## 2022-11-04 DIAGNOSIS — C169 Malignant neoplasm of stomach, unspecified: Secondary | ICD-10-CM | POA: Diagnosis not present

## 2022-11-04 DIAGNOSIS — G893 Neoplasm related pain (acute) (chronic): Secondary | ICD-10-CM | POA: Diagnosis not present

## 2022-11-04 MED ORDER — INSULIN SYRINGE-NEEDLE U-100 31G X 5/16" 0.3 ML MISC
0 refills | Status: DC
Start: 2022-11-04 — End: 2022-11-20
  Filled 2022-11-04: qty 100, 25d supply, fill #0

## 2022-11-04 MED ORDER — ACCU-CHEK GUIDE W/DEVICE KIT
PACK | 0 refills | Status: DC
  Filled 2022-11-04: qty 1, 30d supply, fill #0

## 2022-11-04 MED ORDER — ACCU-CHEK SOFTCLIX LANCETS MISC
0 refills | Status: DC
  Filled 2022-11-04: qty 100, 25d supply, fill #0

## 2022-11-04 MED ORDER — ACCU-CHEK GUIDE VI STRP
ORAL_STRIP | 0 refills | Status: DC
  Filled 2022-11-04: qty 100, 25d supply, fill #0

## 2022-11-04 MED ORDER — INSULIN ASPART 100 UNIT/ML IJ SOLN
INTRAMUSCULAR | 0 refills | Status: DC
  Filled 2022-11-04: qty 10, 22d supply, fill #0

## 2022-11-04 NOTE — Transitions of Care (Post Inpatient/ED Visit) (Signed)
11/04/2022  Name: Steven Wolf MRN: [id number] DOB: 1955/10/15  Today's TOC FU Call Status: Today's TOC FU Call Status:: Successful TOC FU Call Completed TOC FU Call Complete Date: 11/04/22  Transition Care Management Follow-up Telephone Call Date of Discharge: 11/03/22 Discharge Facility: [facility]) Type of Discharge: Inpatient Admission Primary Inpatient Discharge Diagnosis:: Gastric CA- porta cath placement; J-tube insertion How have you been since you were released from the hospital?: Same ("I am doing okay; I will start the treatments this week.  I am not currently taking in any food at all-- I am currently using TPN; we are waiting for the correct syringes to come in for him to start the insulin once his treatments start") Any questions or concerns?: No  Items Reviewed: Did you receive and understand the discharge instructions provided?: Yes (briefly reviewed with patient/ spouse who verbalizes good understanding of same - no AVS visible through EHR) Medications obtained,verified, and reconciled?: Yes (Medications Reviewed) (Full medication reconciliation/ review completed; no concerns or discrepancies identified; confirmed patient obtained/ is taking all newly Rx'd medications as instructed; self-manages medications and denies questions/ concerns around medications today) Any new allergies since your discharge?: No Dietary orders reviewed?: Yes Type of Diet Ordered:: TPN-- patient reports he is currently NPO Do you have support at home?: Yes People in Home: spouse Name of Support/Comfort Primary Source: Reports essentially independent in self-care activities; supportive spouse assists as/ if needed/ indicated  Medications Reviewed Today: Medications Reviewed Today     Reviewed by Michaela Corner, RN (Registered Nurse) on 11/04/22 at 1522  Med List Status: <None>   Medication Order Taking? Sig Documenting Provider Last Dose Status Informant  Accu-Chek Softclix Lancets  lancets [id number] Yes Use to test blood sugar 4 times a day Briant Cedar, MD Taking Active   blood glucose meter kit and supplies KIT [id number] Yes Dispense based on patient and insurance preference. Use up to four times daily as directed. Briant Cedar, MD Taking Active   Blood Glucose Monitoring Suppl (ACCU-CHEK GUIDE) w/Device KIT [id number] Yes Use to test blood sugar 4 times a day Briant Cedar, MD Taking Active   glucose blood (ACCU-CHEK GUIDE) test strip [id number] Yes Use to test blood sugar 4 times a day Briant Cedar, MD Taking Active   insulin aspart (NOVOLOG) 100 UNIT/ML injection [id number] Yes Inject 0-15 Units into the skin 4 (four) times daily. Correction coverage: Moderate (average weight, post-op)  CBG < 70: Implement Hypoglycemia Standing Orders and refer to Hypoglycemia Standing Orders sidebar report  CBG 70 - 120:             0 units  CBG 121 - 150: 2 units  CBG 151 - 200: 3 units  CBG 201 - 250: 5 units  CBG 251 - 300: 8 units  CBG 301 - 350: 11 units  CBG 351 - 400: 15 units  CBG > 400 call MD Briant Cedar, MD Taking Active   insulin aspart (NOVOLOG) 100 UNIT/ML injection [id number] Yes Inject  into the skin according to sliding scale: If CBG 121-150 inject 2 units,151-200 3 units,201-250 5 units,251-300 8 units,301-350 11 units,351-400 15 units,over 400 call MD Briant Cedar, MD Taking Active   Insulin Syringe-Needle U-100 31G X 5/16" 0.3 ML MISC [id number] Yes Use to inject insulin 4 times a day Briant Cedar, MD Taking Active   pantoprazole (PROTONIX) 40 MG injection [id number] No Inject 40 mg into the vein daily.  Patient not taking:  Reported on 11/04/2022   Briant Cedar, MD Not Taking Active   PRESCRIPTION MEDICATION [id number] No CPAP- At bedtime as determined by the patient  Patient not taking: Reported on 11/04/2022   [provider] Not Taking Active Self           Home Care and  Equipment/Supplies: Were Home Health Services Ordered?: No Any new equipment or medical supplies ordered?: No  Functional Questionnaire: Do you need assistance with bathing/showering or dressing?: No Do you need assistance with meal preparation?: No Do you need assistance with eating?: No Do you have difficulty maintaining continence: No Do you need assistance with getting out of bed/getting out of a chair/moving?: No Do you have difficulty managing or taking your medications?: No  Follow up appointments reviewed: PCP Follow-up appointment confirmed?: NA Specialist Hospital Follow-up appointment confirmed?: Yes Date of Specialist follow-up appointment?: 11/07/22 Follow-Up Specialty Provider:: oncology provider Do you need transportation to your follow-up appointment?: No Do you understand care options if your condition(s) worsen?: Yes-patient verbalized understanding  SDOH Interventions Today    Flowsheet Row Most Recent Value  SDOH Interventions   Food Insecurity Interventions Intervention Not Indicated  Transportation Interventions Intervention Not Indicated  [spouse provides transportation]      TOC Interventions Today    Flowsheet Row Most Recent Value  TOC Interventions   TOC Interventions Discussed/Reviewed TOC Interventions Discussed  [Patient declines need for ongoing/ further care coordination outreach,  no care coordination needs identified at time of TOC call today]      Interventions Today    Flowsheet Row Most Recent Value  Chronic Disease   Chronic disease during today's visit Other  [Gastric CA]  General Interventions   General Interventions Discussed/Reviewed General Interventions Discussed, Doctor Visits, Durable Medical Equipment (DME)  Doctor Visits Discussed/Reviewed Doctor Visits Discussed, Specialist, PCP  Durable Medical Equipment (DME) Glucomoter, Walker, BP Cuff  [confirmed currently requiring/ using assistive devices prn: walker]  PCP/Specialist  Visits Compliance with follow-up visit  Nutrition Interventions   Nutrition Discussed/Reviewed Nutrition Discussed  Pharmacy Interventions   Pharmacy Dicussed/Reviewed Pharmacy Topics Discussed  [Full medication review with updating medication list in EHR per patient report]      Caryl Pina, RN, BSN, CCRN Alumnus RN CM Care Coordination/ Transition of Care- [facility] Care Management [phone]: direct office

## 2022-11-05 ENCOUNTER — Inpatient Hospital Stay: Payer: Medicare PPO | Attending: Hematology

## 2022-11-05 ENCOUNTER — Encounter: Payer: Self-pay | Admitting: Hematology

## 2022-11-05 ENCOUNTER — Other Ambulatory Visit (HOSPITAL_COMMUNITY): Payer: Self-pay

## 2022-11-05 ENCOUNTER — Other Ambulatory Visit: Payer: Self-pay

## 2022-11-05 DIAGNOSIS — K311 Adult hypertrophic pyloric stenosis: Secondary | ICD-10-CM | POA: Insufficient documentation

## 2022-11-05 DIAGNOSIS — R000 Tachycardia, unspecified: Secondary | ICD-10-CM | POA: Insufficient documentation

## 2022-11-05 DIAGNOSIS — E860 Dehydration: Secondary | ICD-10-CM | POA: Insufficient documentation

## 2022-11-05 DIAGNOSIS — Z79899 Other long term (current) drug therapy: Secondary | ICD-10-CM | POA: Insufficient documentation

## 2022-11-05 DIAGNOSIS — C169 Malignant neoplasm of stomach, unspecified: Secondary | ICD-10-CM | POA: Insufficient documentation

## 2022-11-05 DIAGNOSIS — C786 Secondary malignant neoplasm of retroperitoneum and peritoneum: Secondary | ICD-10-CM | POA: Insufficient documentation

## 2022-11-05 DIAGNOSIS — D72829 Elevated white blood cell count, unspecified: Secondary | ICD-10-CM | POA: Insufficient documentation

## 2022-11-05 DIAGNOSIS — Z5112 Encounter for antineoplastic immunotherapy: Secondary | ICD-10-CM | POA: Insufficient documentation

## 2022-11-05 DIAGNOSIS — E43 Unspecified severe protein-calorie malnutrition: Secondary | ICD-10-CM | POA: Insufficient documentation

## 2022-11-05 MED ORDER — INSULIN ASPART 100 UNIT/ML FLEXPEN
PEN_INJECTOR | SUBCUTANEOUS | 0 refills | Status: DC
Start: 2022-11-04 — End: 2022-11-20
  Filled 2022-11-05: qty 6, 30d supply, fill #0

## 2022-11-05 MED ORDER — TECHLITE PLUS PEN NEEDLES 32G X 4 MM MISC
0 refills | Status: DC
  Filled 2022-11-05: qty 100, 30d supply, fill #0

## 2022-11-06 ENCOUNTER — Encounter: Payer: Self-pay | Admitting: Hematology

## 2022-11-06 ENCOUNTER — Other Ambulatory Visit (HOSPITAL_COMMUNITY): Payer: Self-pay

## 2022-11-06 DIAGNOSIS — C169 Malignant neoplasm of stomach, unspecified: Secondary | ICD-10-CM | POA: Diagnosis not present

## 2022-11-06 NOTE — Progress Notes (Signed)
Called pt to introduce myself as his Dance movement psychotherapist and to discuss the [employer].  I went over what it covers and gave him the income requirement but his household income exceeds the limit so unfortunately he doesn't qualify for the grant.

## 2022-11-07 ENCOUNTER — Other Ambulatory Visit: Payer: Self-pay

## 2022-11-07 ENCOUNTER — Telehealth: Payer: Self-pay

## 2022-11-07 ENCOUNTER — Inpatient Hospital Stay: Payer: Medicare PPO

## 2022-11-07 ENCOUNTER — Inpatient Hospital Stay: Payer: Medicare PPO | Admitting: Hematology

## 2022-11-07 ENCOUNTER — Encounter: Payer: Self-pay | Admitting: Hematology

## 2022-11-07 ENCOUNTER — Other Ambulatory Visit (HOSPITAL_COMMUNITY): Payer: Self-pay

## 2022-11-07 VITALS — BP 107/83 | HR 118 | Temp 97.6°F | Resp 16

## 2022-11-07 DIAGNOSIS — E43 Unspecified severe protein-calorie malnutrition: Secondary | ICD-10-CM

## 2022-11-07 DIAGNOSIS — C786 Secondary malignant neoplasm of retroperitoneum and peritoneum: Secondary | ICD-10-CM | POA: Diagnosis not present

## 2022-11-07 DIAGNOSIS — R000 Tachycardia, unspecified: Secondary | ICD-10-CM | POA: Diagnosis not present

## 2022-11-07 DIAGNOSIS — Z5112 Encounter for antineoplastic immunotherapy: Secondary | ICD-10-CM | POA: Diagnosis not present

## 2022-11-07 DIAGNOSIS — C169 Malignant neoplasm of stomach, unspecified: Secondary | ICD-10-CM | POA: Diagnosis not present

## 2022-11-07 DIAGNOSIS — K311 Adult hypertrophic pyloric stenosis: Secondary | ICD-10-CM | POA: Diagnosis not present

## 2022-11-07 DIAGNOSIS — E860 Dehydration: Secondary | ICD-10-CM | POA: Diagnosis not present

## 2022-11-07 DIAGNOSIS — Z7189 Other specified counseling: Secondary | ICD-10-CM

## 2022-11-07 DIAGNOSIS — Z79899 Other long term (current) drug therapy: Secondary | ICD-10-CM | POA: Diagnosis not present

## 2022-11-07 DIAGNOSIS — D72829 Elevated white blood cell count, unspecified: Secondary | ICD-10-CM | POA: Diagnosis not present

## 2022-11-07 LAB — CBC WITH DIFFERENTIAL (CANCER CENTER ONLY)
Abs Immature Granulocytes: 0.22 10*3/uL — ABNORMAL HIGH (ref 0.00–0.07)
Basophils Absolute: 0.1 10*3/uL (ref 0.0–0.1)
Basophils Relative: 1 %
Eosinophils Absolute: 0.1 10*3/uL (ref 0.0–0.5)
Eosinophils Relative: 1 %
HCT: 37.7 % — ABNORMAL LOW (ref 39.0–52.0)
Hemoglobin: 12.5 g/dL — ABNORMAL LOW (ref 13.0–17.0)
Immature Granulocytes: 2 %
Lymphocytes Relative: 10 %
Lymphs Abs: 1.4 10*3/uL (ref 0.7–4.0)
MCH: 29.8 pg (ref 26.0–34.0)
MCHC: 33.2 g/dL (ref 30.0–36.0)
MCV: 90 fL (ref 80.0–100.0)
Monocytes Absolute: 1.4 10*3/uL — ABNORMAL HIGH (ref 0.1–1.0)
Monocytes Relative: 11 %
Neutro Abs: 10.4 10*3/uL — ABNORMAL HIGH (ref 1.7–7.7)
Neutrophils Relative %: 75 %
Platelet Count: 386 10*3/uL (ref 150–400)
RBC: 4.19 MIL/uL — ABNORMAL LOW (ref 4.22–5.81)
RDW: 13.4 % (ref 11.5–15.5)
WBC Count: 13.7 10*3/uL — ABNORMAL HIGH (ref 4.0–10.5)
nRBC: 0 % (ref 0.0–0.2)

## 2022-11-07 LAB — CMP (CANCER CENTER ONLY)
ALT: 22 U/L (ref 0–44)
AST: 17 U/L (ref 15–41)
Albumin: 3 g/dL — ABNORMAL LOW (ref 3.5–5.0)
Alkaline Phosphatase: 162 U/L — ABNORMAL HIGH (ref 38–126)
Anion gap: 7 (ref 5–15)
BUN: 29 mg/dL — ABNORMAL HIGH (ref 8–23)
CO2: 29 mmol/L (ref 22–32)
Calcium: 8.3 mg/dL — ABNORMAL LOW (ref 8.9–10.3)
Chloride: 100 mmol/L (ref 98–111)
Creatinine: 0.66 mg/dL (ref 0.61–1.24)
GFR, Estimated: >60 mL/min (ref >60)
Glucose, Bld: 88 mg/dL (ref 70–99)
Potassium: 4.5 mmol/L (ref 3.5–5.1)
Sodium: 136 mmol/L (ref 135–145)
Total Bilirubin: 1.1 mg/dL (ref 0.3–1.2)
Total Protein: 6.2 g/dL — ABNORMAL LOW (ref 6.5–8.1)

## 2022-11-07 LAB — TSH: TSH: 7.697 u[IU]/mL — ABNORMAL HIGH (ref 0.350–4.500)

## 2022-11-07 MED ORDER — SODIUM CHLORIDE 0.9 % IV SOLN
240.0000 mg | Freq: Once | INTRAVENOUS | Status: AC
  Administered 2022-11-07: 240 mg via INTRAVENOUS
  Filled 2022-11-07: qty 24

## 2022-11-07 MED ORDER — SODIUM CHLORIDE 0.9% FLUSH: 10.0000 mL | INTRAVENOUS | Status: DC | PRN

## 2022-11-07 MED ORDER — SODIUM CHLORIDE 0.9% FLUSH
10.0000 mL | Freq: Once | INTRAVENOUS | Status: AC | PRN
  Administered 2022-11-07: 10 mL

## 2022-11-07 MED ORDER — SODIUM CHLORIDE 0.9 % IV SOLN: Freq: Once | INTRAVENOUS | Status: AC

## 2022-11-07 MED ORDER — OMEPRAZOLE-SODIUM BICARBONATE 2-84 MG/ML PO SUSR
20.0000 mL | Freq: Every day | ORAL | 1 refills | Status: DC
Start: 2022-11-07 — End: 2022-12-07
  Filled 2022-11-07: qty 300, 15d supply, fill #0

## 2022-11-07 MED ORDER — FAMOTIDINE IN NACL 20-0.9 MG/50ML-% IV SOLN
20.0000 mg | Freq: Once | INTRAVENOUS | Status: AC
  Administered 2022-11-07: 20 mg via INTRAVENOUS
  Filled 2022-11-07: qty 50

## 2022-11-07 MED ORDER — HEPARIN SOD (PORK) LOCK FLUSH 100 UNIT/ML IV SOLN: 500.0000 [IU] | Freq: Once | INTRAVENOUS | Status: DC | PRN

## 2022-11-07 MED ORDER — SODIUM CHLORIDE 0.9 % IV SOLN
80.0000 mg | Freq: Once | INTRAVENOUS | Status: AC
  Administered 2022-11-07: 80 mg via INTRAVENOUS
  Filled 2022-11-07: qty 16

## 2022-11-07 MED ORDER — ONDANSETRON 4 MG PO TBDP
4.0000 mg | ORAL_TABLET | Freq: Three times a day (TID) | ORAL | 2 refills | Status: DC | PRN
  Filled 2022-11-07: qty 30, 5d supply, fill #0

## 2022-11-07 MED ORDER — DIPHENHYDRAMINE HCL 50 MG/ML IJ SOLN
25.0000 mg | Freq: Once | INTRAMUSCULAR | Status: AC
  Administered 2022-11-07: 25 mg via INTRAVENOUS
  Filled 2022-11-07: qty 1

## 2022-11-07 NOTE — Assessment & Plan Note (Signed)
-  Secondary to underlying malignancy -Patient has J feeding tube, but is not able to tolerate likely due to the bowel obstruction -continue TPN

## 2022-11-07 NOTE — Progress Notes (Signed)
Patient seen by Dr. America Brown are not all within treatment parameters. Heart rate 118  Labs reviewed: and are within treatment parameters. CMP pending  Per physician team, patient is ready for treatment and there are NO modifications to the treatment plan.

## 2022-11-07 NOTE — Assessment & Plan Note (Signed)
-  Secondary to gastric cancer -Clear liquid by mouth only

## 2022-11-07 NOTE — Telephone Encounter (Signed)
Notified Patient of prior authorization approval for Konvomep Suspension. Medication is approved through 03/25/2023. Pharmacy notified. No other needs or concerns voiced at this time.

## 2022-11-07 NOTE — Progress Notes (Signed)
Ipilimumab (YERVOY) Patient Monitoring Assessment   Is the patient experiencing any of the following general symptoms?:  [x] Difficulty performing normal activities [x] Feeling sluggish or cold all the time [] Unusual weight gain [] Constant or unusual headaches [] Feeling dizzy or faint [] Changes in eyesight (blurry vision, double vision, or other vision problems) [x] Changes in mood or behavior (ex: decreased sex drive, irritability, or forgetfulness) [] Starting new medications (ex: steroids, other medications that lower immune response) [] Patient is not experiencing any of the general symptoms above.    Gastrointestinal  Patient is having 1 bowel movements each day.  Is this different from baseline? [] Yes [x] No Are your stools watery or do they have a foul smell? [x] Yes [] No Have you seen blood in your stools? [] Yes [x] No Are your stools dark, tarry, or sticky? [] Yes [x] No Are you having pain or tenderness in your belly? [] Yes [x] No  Skin Does your skin itch? [] Yes [x] No Do you have a rash? [] Yes [x] No Has your skin blistered and/or peeled? [] Yes [x] No Do you have sores in your mouth? [] Yes [x] No  Hepatic Has your urine been dark or tea colored? [] Yes [x] No Have you noticed that your skin or the whites of your eyes are turning yellow? [] Yes [x] No Are you bleeding or bruising more easily than normal? [] Yes [x] No Are you nauseous and/or vomiting? [] Yes [x] No Do you have pain on the right side of your stomach? [] Yes [x] No  Neurologic  Are you having unusual weakness of legs, arms, or face? [] Yes [x] No Are you having numbness or tingling in your hands or feet? [] Yes [x] No  Consepcion Hearing

## 2022-11-07 NOTE — Assessment & Plan Note (Signed)
-  Adenocarcinoma with signet ring features, stage IV with diffuse peritoneal metastasis, G3, loss of PMS2 -Diagnosed in July 2024, he presented with dysphagia and 25 pound weight loss. 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 -I again reviewed the potential side effect and what to watch, and management.  He agrees to proceed.

## 2022-11-07 NOTE — Progress Notes (Signed)
[facility]   Telephone:([phone]) [phone] Fax:([phone]) [phone]   Clinic Follow up Note   Patient Care Team: Pincus Sanes, MD as PCP - General (Internal Medicine) Glendale Chard, DO as Consulting Physician (Neurology) [name], MD as Consulting Physician (Hematology and Oncology)  Date of Service:  11/07/2022  CHIEF COMPLAINT: f/u of Gastric cancer   CURRENT THERAPY:  Nivolumab (3) D1,15,29 +Ipilimumab (1) D1 q42d   ASSESSMENT:  Steven Wolf is a 67 y.o. 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 -I again reviewed the potential side effect and what to watch, and management.  He agrees to proceed.  Goals of care, counseling/discussion -We again discussed the incurable nature of his/her cancer, and the overall poor prognosis, especially if he does not have good response to chemotherapy or progress on chemo -The patient understands the goal of care is palliative. 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 -Clear liquid by mouth only  Protein-calorie malnutrition, severe -Secondary to underlying malignancy -Patient has J feeding tube, but is not able to tolerate likely due to the bowel obstruction -continue TPN  -will add some IVF to his TPN since he is not on any oral or tube feeds   Nausea -Secondary to his gastric  cancer -Continue Zofran ODT, I called in today  -I also called in liquid omeprazole, he is not able to tolerate oral pantoprazole pill -Refer to palliative care clinic    PLAN: -lab review -Will proceed first dose nivolumab and ipilimumab today -I prescribe Zofran -lab/flush and treatment 8/29  SUMMARY OF ONCOLOGIC HISTORY: Oncology History  Adenocarcinoma of stomach (HCC)  10/01/2022 Initial Diagnosis   Adenocarcinoma of stomach (HCC)   10/01/2022 Pathology Results   Diagnosis 1. Stomach, biopsy, gastric mass, cold biopsy - POORLY DIFFERENTIATED ADENOCARCINOMA WITH SIGNET RING CELL FEATURES.  2. Stomach- Cardia, cold biopsy - OXYNTIC MUCOSA WITH LAMINA PROPRIA EDEMA. - NEGATIVE FOR MALIGNANCY ON THE SECTIONS EXAMINED   10/11/2022 Imaging    IMPRESSION: 1. Gastric wall thickening, consistent with known gastric cancer. Perigastric fat stranding, concerning for local invasion. 2. New prominent subcentimeter gastrohepatic ligament and left para-aortic lymph nodes, concerning for nodal metastatic disease. 3. Mild aortic Atherosclerosis (ICD10-I70.0).     11/07/2022 -  Chemotherapy   Patient is on Treatment Plan : LUNG NSCLC Nivolumab (3) D1,15,29 + Ipilimumab (1) D1 q42d        INTERVAL HISTORY:  Steven Wolf is here for a follow up of Gastric cancer. He was last seen by me on 10/14/2022. He presents to the clinic accompanied by wife. Pt state that  he is doing well since having the J -tube placed. Pt state that his nausea is down.     All other systems were reviewed with the patient and are negative.  MEDICAL HISTORY:  Past Medical History:  Diagnosis Date   Anxiety    Benign prostatic hypertrophy    Diverticulosis of  colon    GERD (gastroesophageal reflux disease)    Sullivan Lone syndrome    History of kidney stones    Hypertension    Microscopic hematuria    Motion sickness    ocean boats, curvy roads   Nephrolithiasis     X 2; Dr Aldean Ast   Sleep apnea     USES CPAP   Syncope 02/2007   NOCTURNAL POST MICTURTION   Urticaria     SURGICAL HISTORY: Past Surgical History:  Procedure Laterality Date   APPENDECTOMY     BIOPSY  10/16/2022   Procedure: BIOPSY;  Surgeon: Lemar Lofty., MD;  Location: [facility];  Service: Gastroenterology;;   COLONOSCOPY  2010   NORMAL(pt states 2 other colonoscopies in the past)   COLONOSCOPY W/ POLYPECTOMY  2002   Tics 2005 & 2010; due 2020, Dr Jarold Motto   ESOPHAGOGASTRODUODENOSCOPY (EGD) WITH PROPOFOL N/A 09/30/2022   Procedure: ESOPHAGOGASTRODUODENOSCOPY (EGD) WITH BIOPSY;  Surgeon: Midge Minium, MD;  Location: [facility];  Service: Endoscopy;  Laterality: N/A;   ESOPHAGOGASTRODUODENOSCOPY (EGD) WITH PROPOFOL N/A 10/16/2022   Procedure: ESOPHAGOGASTRODUODENOSCOPY (EGD) WITH PROPOFOL;  Surgeon: Meridee Score Netty Starring., MD;  Location: [facility];  Service: Gastroenterology;  Laterality: N/A;   EUS N/A 10/16/2022   Procedure: UPPER ENDOSCOPIC ULTRASOUND (EUS) RADIAL;  Surgeon: Lemar Lofty., MD;  Location: [facility];  Service: Gastroenterology;  Laterality: N/A;   LAPAROSCOPY N/A 10/16/2022   Procedure: DIAGNOSTIC LAPAROSCOPY, OPEN J TUBE PLACEMENT;  Surgeon: Almond Lint, MD;  Location: [facility];  Service: General;  Laterality: N/A;   LIVER BIOPSY     X 1 for elevated LFTs with NEGATIVE  hepatitis serologies   PORTACATH PLACEMENT N/A 10/16/2022   Procedure: INSERTION PORT-A-CATH;  Surgeon: Almond Lint, MD;  Location: [facility];  Service: General;  Laterality: N/A;   TONSILLECTOMY     TONSILLECTOMY     PT STATES HAS HAD IT TWICE   UPPER GASTROINTESTINAL ENDOSCOPY  1995    I have reviewed the social history and family history with the patient and they are unchanged from previous note.  ALLERGIES:  is allergic to ramipril and penicillins.  MEDICATIONS:  Current Outpatient Medications  Medication Sig Dispense Refill   Omeprazole-Sodium Bicarbonate 2-84 MG/ML SUSR Take 20 mLs (40  mg total) by mouth daily. 300 mL 1   ondansetron (ZOFRAN-ODT) 4 MG disintegrating tablet Dissolve 1-2 tablets (4-8 mg total) by mouth every 8 (eight) hours as needed for nausea or vomiting. 30 tablet 2   Accu-Chek Softclix Lancets lancets Use to test blood sugar 4 times a day 100 each 0   blood glucose meter kit and supplies KIT Dispense based on patient and insurance preference. Use up to four times daily as directed. 1 each 0   Blood Glucose Monitoring Suppl (ACCU-CHEK GUIDE) w/Device KIT Use to test blood sugar 4 times a day 1 kit 0   glucose blood (ACCU-CHEK GUIDE) test strip Use to test blood sugar 4 times a day 100 each 0   insulin aspart (NOVOLOG) 100 UNIT/ML FlexPen Inject  into the skin according to sliding scale: If CBG 121-150 inject 2 units,151-200 3 units,201-250 5 units,251-300 8 units,301-350 11 units,351-400 15 units,over 400 call MD 6 mL 0   insulin aspart (NOVOLOG) 100 UNIT/ML injection Inject 0-15 Units into the skin 4 (four) times daily. Correction coverage: Moderate (average weight, post-op)  CBG < 70: Implement Hypoglycemia Standing Orders and refer to Hypoglycemia Standing Orders sidebar report  CBG 70 - 120:  0 units  CBG 121 - 150: 2 units  CBG 151 - 200: 3 units  CBG 201 - 250: 5 units  CBG 251 - 300: 8 units  CBG 301 - 350: 11 units  CBG 351 - 400: 15 units  CBG > 400 call MD 10 mL 0   insulin aspart (NOVOLOG) 100 UNIT/ML injection Inject  into the skin according to sliding scale: If CBG 121-150 inject 2 units,151-200 3 units,201-250 5 units,251-300 8 units,301-350 11 units,351-400 15 units,over 400 call MD 10 mL 0   Insulin Pen Needle (TECHLITE PLUS PEN NEEDLES) 32G X 4 MM MISC Use to inject insulin as directed 100 each 0   Insulin Syringe-Needle U-100 31G X 5/16" 0.3 ML MISC Use to inject insulin 4 times a day 100 each 0   pantoprazole (PROTONIX) 40 MG injection Inject 40 mg into the vein daily. (Patient not taking: Reported on 11/04/2022) 30 each 0    PRESCRIPTION MEDICATION CPAP- At bedtime as determined by the patient (Patient not taking: Reported on 11/04/2022)     No current facility-administered medications for this visit.   Facility-Administered Medications Ordered in Other Visits  Medication Dose Route Frequency Provider Last Rate Last Admin   heparin lock flush 100 unit/mL  500 Units Intracatheter Once PRN [name], MD       sodium chloride flush (NS) 0.9 % injection 10 mL  10 mL Intracatheter PRN [name], MD        PHYSICAL EXAMINATION: ECOG PERFORMANCE STATUS: 2 - Symptomatic, <50% confined to bed  There were no vitals filed for this visit. Wt Readings from Last 3 Encounters:  11/03/22 176 lb 2.4 oz (79.9 kg)  10/14/22 159 lb 4.8 oz (72.3 kg)  10/09/22 160 lb 3.2 oz (72.7 kg)     GENERAL:alert, no distress and comfortable SKIN: skin color normal, no rashes or significant lesions EYES: normal, Conjunctiva are pink and non-injected, sclera clear  NEURO: alert & oriented x 3 with fluent speech LUNGS: (-) clear to auscultation and percussion with normal breathing effort ABDOMEN:(-) abdomen soft, (-) non-tender and normal bowel sounds  LABORATORY DATA:  I have reviewed the data as listed    Latest Ref Rng & Units 11/07/2022    7:37 AM 11/02/2022    6:24 AM 11/01/2022    7:10 AM  CBC  WBC 4.0 - 10.5 K/uL 13.7  10.6  10.8   Hemoglobin 13.0 - 17.0 g/dL 78.2  95.6  21.3   Hematocrit 39.0 - 52.0 % 37.7  35.7  34.8   Platelets 150 - 400 K/uL 386  296  289         Latest Ref Rng & Units 11/07/2022    7:37 AM 11/03/2022    5:53 AM 11/02/2022    6:24 AM  CMP  Glucose 70 - 99 mg/dL 88  086  578   BUN 8 - 23 mg/dL 29  25  26    Creatinine 0.61 - 1.24 mg/dL 4.69  6.29  5.28   Sodium 135 - 145 mmol/L 136  132  131   Potassium 3.5 - 5.1 mmol/L 4.5  4.2  4.3   Chloride 98 - 111 mmol/L 100  98  99   CO2 22 - 32 mmol/L 29  25  24    Calcium 8.9 - 10.3 mg/dL 8.3  8.2  7.8   Total Protein 6.5 - 8.1 g/dL 6.2  5.3  5.0   Total  Bilirubin 0.3 - 1.2 mg/dL 1.1  0.5  0.7   Alkaline Phos 38 - 126 U/L 162  140  135   AST 15 - 41 U/L 17  21  19    ALT 0 - 44 U/L 22  23  22        RADIOGRAPHIC STUDIES: I have personally reviewed the radiological images as listed and agreed with the findings in the report. No results found.    Orders Placed This Encounter  Procedures   Amb Referral to Palliative Care    Referral Priority:   Routine    Referral Type:   Consultation    Referral Reason:   Symptom Managment    Number of Visits Requested:   1   All questions were answered. The patient knows to call the clinic with any problems, questions or concerns. No barriers to learning was detected. The total time spent in the appointment was 30 minutes.     [name], MD 11/07/2022   Carolin Coy, CMA, am acting as scribe for [name], MD.   I have reviewed the above documentation for accuracy and completeness, and I agree with the above.

## 2022-11-07 NOTE — Progress Notes (Signed)
Sent email to Jeri Modena w/[facility] regarding adding on IVFs.  Pam emailed back stated she will check w/their pharmacist who handles the pt's TPN to see if the IVFs can be done in between pt's TPN times.  Informed Pam that Dr. Mosetta Putt is OK w/565ml NS daily over 1hr or 1L NS 3xwkly over 1hr.  Pam emailed back to say she will f/u once her pharmacist responds to her.  Awaiting Pam's response.

## 2022-11-07 NOTE — Patient Instructions (Signed)
[facility]  Discharge Instructions: Thank you for choosing [facility] to provide your oncology and hematology care.   If you have a lab appointment with the [facility], please go directly to the [facility] and check in at the registration area.   Wear comfortable clothing and clothing appropriate for easy access to any Portacath or PICC line.   We strive to give you quality time with your provider. You may need to reschedule your appointment if you arrive late (15 or more minutes).  Arriving late affects you and other patients whose appointments are after yours.  Also, if you miss three or more appointments without notifying the office, you may be dismissed from the clinic at the provider's discretion.      For prescription refill requests, have your pharmacy contact our office and allow 72 hours for refills to be completed.    Today you received the following chemotherapy and/or immunotherapy agents: nivolumab and ipilimumab      To help prevent nausea and vomiting after your treatment, we encourage you to take your nausea medication as directed.  BELOW ARE SYMPTOMS THAT SHOULD BE REPORTED IMMEDIATELY: *FEVER GREATER THAN 100.4 F (38 C) OR HIGHER *CHILLS OR SWEATING *NAUSEA AND VOMITING THAT IS NOT CONTROLLED WITH YOUR NAUSEA MEDICATION *UNUSUAL SHORTNESS OF BREATH *UNUSUAL BRUISING OR BLEEDING *URINARY PROBLEMS (pain or burning when urinating, or frequent urination) *BOWEL PROBLEMS (unusual diarrhea, constipation, pain near the anus) TENDERNESS IN MOUTH AND THROAT WITH OR WITHOUT PRESENCE OF ULCERS (sore throat, sores in mouth, or a toothache) UNUSUAL RASH, SWELLING OR PAIN  UNUSUAL VAGINAL DISCHARGE OR ITCHING   Items with * indicate a potential emergency and should be followed up as soon as possible or go to the Emergency Department if any problems should occur.  Please show the CHEMOTHERAPY ALERT CARD or IMMUNOTHERAPY  ALERT CARD at check-in to the Emergency Department and triage nurse.  Should you have questions after your visit or need to cancel or reschedule your appointment, please contact [facility]  Dept: [phone]  and follow the prompts.  Office hours are 8:00 a.m. to 4:30 p.m. Monday - Friday. Please note that voicemails left after 4:00 p.m. may not be returned until the following business day.  We are closed weekends and major holidays. You have access to a nurse at all times for urgent questions. Please call the main number to the clinic Dept: [phone] and follow the prompts.   For any non-urgent questions, you may also contact your provider using MyChart. We now offer e-Visits for anyone 96 and older to request care online for non-urgent symptoms. For details visit [email or web address].   Also download the MyChart app! Go to the app store, search "MyChart", open the app, select [facility], and log in with your MyChart username and password.

## 2022-11-07 NOTE — Progress Notes (Signed)
Patient has gained weight since initial treatment plan ordered. Per Dr. Mosetta Putt, adjust treatment plan to give Yervoy 80 mg (1 mg/kg).   Steven Wolf, PharmD PGY2 Oncology Pharmacy Resident   11/07/2022 9:40 AM

## 2022-11-07 NOTE — Assessment & Plan Note (Signed)
-  We again discussed the incurable nature of his/her cancer, and the overall poor prognosis, especially if he does not have good response to chemotherapy or progress on chemo -The patient understands the goal of care is palliative. -SW referral for advanced directives

## 2022-11-08 ENCOUNTER — Other Ambulatory Visit (HOSPITAL_COMMUNITY): Payer: Self-pay

## 2022-11-08 ENCOUNTER — Inpatient Hospital Stay: Payer: Medicare PPO | Admitting: Licensed Clinical Social Worker

## 2022-11-08 ENCOUNTER — Encounter: Payer: Self-pay | Admitting: Hematology

## 2022-11-08 DIAGNOSIS — I10 Essential (primary) hypertension: Secondary | ICD-10-CM | POA: Diagnosis not present

## 2022-11-08 DIAGNOSIS — G893 Neoplasm related pain (acute) (chronic): Secondary | ICD-10-CM | POA: Diagnosis not present

## 2022-11-08 DIAGNOSIS — C169 Malignant neoplasm of stomach, unspecified: Secondary | ICD-10-CM | POA: Diagnosis not present

## 2022-11-08 DIAGNOSIS — Z434 Encounter for attention to other artificial openings of digestive tract: Secondary | ICD-10-CM | POA: Diagnosis not present

## 2022-11-08 DIAGNOSIS — K632 Fistula of intestine: Secondary | ICD-10-CM | POA: Diagnosis not present

## 2022-11-08 DIAGNOSIS — Z452 Encounter for adjustment and management of vascular access device: Secondary | ICD-10-CM | POA: Diagnosis not present

## 2022-11-08 DIAGNOSIS — K311 Adult hypertrophic pyloric stenosis: Secondary | ICD-10-CM | POA: Diagnosis not present

## 2022-11-08 DIAGNOSIS — E43 Unspecified severe protein-calorie malnutrition: Secondary | ICD-10-CM | POA: Diagnosis not present

## 2022-11-08 LAB — T4: T4, Total: 9 ug/dL (ref 4.5–12.0)

## 2022-11-08 NOTE — Progress Notes (Signed)
[facility] CSW Progress Note  Clinical Child psychotherapist  contacted pt by phone to check in following his first treatment.  Pt reports he is doing well overall.  Pt saw palliative care while in patient and verbalized agreement w/ following up w/ palliative care team at the cancer center.  Message sent to palliative team requesting an appointment on 8/29 if possible as pt already has other appointments at the cancer center on that day.        Rachel Moulds, LCSW Clinical Social Worker [facility]

## 2022-11-09 ENCOUNTER — Other Ambulatory Visit: Payer: Self-pay | Admitting: Internal Medicine

## 2022-11-09 ENCOUNTER — Other Ambulatory Visit (HOSPITAL_COMMUNITY): Payer: Self-pay

## 2022-11-09 DIAGNOSIS — K311 Adult hypertrophic pyloric stenosis: Secondary | ICD-10-CM | POA: Diagnosis not present

## 2022-11-09 DIAGNOSIS — C169 Malignant neoplasm of stomach, unspecified: Secondary | ICD-10-CM | POA: Diagnosis not present

## 2022-11-11 ENCOUNTER — Other Ambulatory Visit (HOSPITAL_COMMUNITY): Payer: Self-pay

## 2022-11-11 ENCOUNTER — Encounter: Payer: Self-pay | Admitting: Hematology

## 2022-11-11 ENCOUNTER — Telehealth: Payer: Self-pay

## 2022-11-11 ENCOUNTER — Other Ambulatory Visit: Payer: Self-pay | Admitting: Internal Medicine

## 2022-11-11 MED ORDER — ACCU-CHEK GUIDE VI STRP
ORAL_STRIP | 0 refills | Status: DC
  Filled 2022-11-11 – 2022-11-19 (×3): qty 100, 25d supply, fill #0

## 2022-11-11 MED ORDER — ACCU-CHEK SOFTCLIX LANCETS MISC
0 refills | Status: DC
  Filled 2022-11-11: qty 100, fill #0
  Filled 2022-11-13 – 2022-11-19 (×2): qty 100, 25d supply, fill #0

## 2022-11-11 NOTE — Telephone Encounter (Signed)
Spoke with pt and wife regarding previous voicemail message about pt not having bowel movements since 11/07/2022.  Spoke with Dr. Mosetta Putt regarding pt's BM's.  Per Dr. Mosetta Putt since the pt is on strictly TPN it's expected to not have bowel movements.  Informed pt and spouse of this information.  Both pt and spouse verbalized understanding and had no further questions or concerns.

## 2022-11-12 ENCOUNTER — Other Ambulatory Visit: Payer: Medicare PPO

## 2022-11-12 ENCOUNTER — Encounter: Payer: Medicare PPO | Admitting: Nutrition

## 2022-11-12 ENCOUNTER — Ambulatory Visit: Payer: Medicare PPO | Admitting: Hematology

## 2022-11-12 ENCOUNTER — Ambulatory Visit: Payer: Medicare PPO

## 2022-11-12 NOTE — Assessment & Plan Note (Signed)
-  Adenocarcinoma with signet ring features, stage IV with diffuse peritoneal metastasis, G3, loss of PMS2 -Diagnosed in July 2024, he presented with dysphagia and 25 pound weight loss. 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 He started on 11/07/2022

## 2022-11-12 NOTE — Assessment & Plan Note (Signed)
-  Secondary to underlying malignancy -Patient has J feeding tube, but is not able to tolerate likely due to the bowel obstruction -continue TPN

## 2022-11-12 NOTE — Assessment & Plan Note (Signed)
-  We again discussed the incurable nature of his/her cancer, and the overall poor prognosis, especially if he does not have good response to chemotherapy or progress on chemo -The patient understands the goal of care is palliative. -He open to DNR and hospice when it's time for him

## 2022-11-12 NOTE — Assessment & Plan Note (Signed)
-  Secondary to gastric cancer -Clear liquid by mouth only

## 2022-11-13 ENCOUNTER — Encounter: Payer: Self-pay | Admitting: Hematology

## 2022-11-13 ENCOUNTER — Inpatient Hospital Stay: Payer: Medicare PPO

## 2022-11-13 ENCOUNTER — Inpatient Hospital Stay: Payer: Medicare PPO | Admitting: Hematology

## 2022-11-13 ENCOUNTER — Other Ambulatory Visit (HOSPITAL_COMMUNITY): Payer: Self-pay

## 2022-11-13 ENCOUNTER — Other Ambulatory Visit: Payer: Self-pay

## 2022-11-13 VITALS — BP 115/92 | HR 125 | Temp 97.8°F | Resp 18 | Wt 159.4 lb

## 2022-11-13 DIAGNOSIS — K311 Adult hypertrophic pyloric stenosis: Secondary | ICD-10-CM

## 2022-11-13 DIAGNOSIS — E43 Unspecified severe protein-calorie malnutrition: Secondary | ICD-10-CM | POA: Diagnosis not present

## 2022-11-13 DIAGNOSIS — Z7189 Other specified counseling: Secondary | ICD-10-CM | POA: Diagnosis not present

## 2022-11-13 DIAGNOSIS — C786 Secondary malignant neoplasm of retroperitoneum and peritoneum: Secondary | ICD-10-CM | POA: Diagnosis not present

## 2022-11-13 DIAGNOSIS — C169 Malignant neoplasm of stomach, unspecified: Secondary | ICD-10-CM | POA: Diagnosis not present

## 2022-11-13 DIAGNOSIS — Z79899 Other long term (current) drug therapy: Secondary | ICD-10-CM | POA: Diagnosis not present

## 2022-11-13 DIAGNOSIS — R000 Tachycardia, unspecified: Secondary | ICD-10-CM | POA: Diagnosis not present

## 2022-11-13 DIAGNOSIS — Z5112 Encounter for antineoplastic immunotherapy: Secondary | ICD-10-CM | POA: Diagnosis not present

## 2022-11-13 DIAGNOSIS — D72829 Elevated white blood cell count, unspecified: Secondary | ICD-10-CM | POA: Diagnosis not present

## 2022-11-13 DIAGNOSIS — E860 Dehydration: Secondary | ICD-10-CM | POA: Diagnosis not present

## 2022-11-13 LAB — CBC WITH DIFFERENTIAL (CANCER CENTER ONLY)
Abs Immature Granulocytes: 0.38 10*3/uL — ABNORMAL HIGH (ref 0.00–0.07)
Basophils Absolute: 0.1 10*3/uL (ref 0.0–0.1)
Basophils Relative: 1 %
Eosinophils Absolute: 0.2 10*3/uL (ref 0.0–0.5)
Eosinophils Relative: 1 %
HCT: 38.2 % — ABNORMAL LOW (ref 39.0–52.0)
Hemoglobin: 12.6 g/dL — ABNORMAL LOW (ref 13.0–17.0)
Immature Granulocytes: 2 %
Lymphocytes Relative: 7 %
Lymphs Abs: 1.3 10*3/uL (ref 0.7–4.0)
MCH: 29.9 pg (ref 26.0–34.0)
MCHC: 33 g/dL (ref 30.0–36.0)
MCV: 90.5 fL (ref 80.0–100.0)
Monocytes Absolute: 1.7 10*3/uL — ABNORMAL HIGH (ref 0.1–1.0)
Monocytes Relative: 9 %
Neutro Abs: 15.1 10*3/uL — ABNORMAL HIGH (ref 1.7–7.7)
Neutrophils Relative %: 80 %
Platelet Count: 283 10*3/uL (ref 150–400)
RBC: 4.22 MIL/uL (ref 4.22–5.81)
RDW: 13.4 % (ref 11.5–15.5)
WBC Count: 18.8 10*3/uL — ABNORMAL HIGH (ref 4.0–10.5)
nRBC: 0 % (ref 0.0–0.2)

## 2022-11-13 LAB — CMP (CANCER CENTER ONLY)
ALT: 12 U/L (ref 0–44)
AST: 13 U/L — ABNORMAL LOW (ref 15–41)
Albumin: 2.8 g/dL — ABNORMAL LOW (ref 3.5–5.0)
Alkaline Phosphatase: 107 U/L (ref 38–126)
Anion gap: 4 — ABNORMAL LOW (ref 5–15)
BUN: 32 mg/dL — ABNORMAL HIGH (ref 8–23)
CO2: 30 mmol/L (ref 22–32)
Calcium: 8.6 mg/dL — ABNORMAL LOW (ref 8.9–10.3)
Chloride: 101 mmol/L (ref 98–111)
Creatinine: 0.65 mg/dL (ref 0.61–1.24)
GFR, Estimated: >60 mL/min (ref >60)
Glucose, Bld: 116 mg/dL — ABNORMAL HIGH (ref 70–99)
Potassium: 4.5 mmol/L (ref 3.5–5.1)
Sodium: 135 mmol/L (ref 135–145)
Total Bilirubin: 1.2 mg/dL (ref 0.3–1.2)
Total Protein: 6.2 g/dL — ABNORMAL LOW (ref 6.5–8.1)

## 2022-11-13 MED ORDER — HEPARIN SOD (PORK) LOCK FLUSH 100 UNIT/ML IV SOLN
500.0000 [IU] | Freq: Once | INTRAVENOUS | Status: AC | PRN
  Administered 2022-11-13: 500 [IU]

## 2022-11-13 MED ORDER — SODIUM CHLORIDE 0.9% FLUSH
10.0000 mL | Freq: Once | INTRAVENOUS | Status: AC | PRN
  Administered 2022-11-13: 10 mL

## 2022-11-13 MED ORDER — SODIUM CHLORIDE 0.9 % IV SOLN: INTRAVENOUS | Status: DC | PRN

## 2022-11-13 MED ORDER — ONDANSETRON HCL 4 MG/2ML IJ SOLN
8.0000 mg | Freq: Once | INTRAMUSCULAR | Status: AC
  Administered 2022-11-13: 8 mg via INTRAVENOUS
  Filled 2022-11-13: qty 4

## 2022-11-13 MED ORDER — SODIUM CHLORIDE 0.9 % IV SOLN: Freq: Once | INTRAVENOUS | Status: AC

## 2022-11-13 NOTE — Progress Notes (Signed)
Gtube noted to have increased drainage/redness around insertion site with stage 2 pressure injury noted at 4 o'clock site of tube. G Tube cleansed with NS and chlorhexidine, gauze dressing applied. Mosetta Putt, MD notified/assessed. MD recommended pt/family to cleanse tube with alcohol and apply neosporin. Pt advised to monitor site for increased redness/itching/pain. Verbalized understanding.

## 2022-11-13 NOTE — Progress Notes (Signed)
[facility]   [phone] [phone]   Clinic Follow up Note   Patient Care Team: Pincus Sanes, MD as PCP - General (Internal Medicine) Glendale Chard, DO as Consulting Physician (Neurology) [name], MD as Consulting Physician (Hematology and Oncology)  Date of Service:  11/13/2022  CHIEF COMPLAINT: f/u of Gastric cancer   CURRENT THERAPY:  Nivolumab (3) D1,15,29 +Ipilimumab (1) D1 q42d    ASSESSMENT:  Steven Wolf is a 67 y.o. 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 -He open to DNR and hospice when it's time for him      PLAN: -IV hydration today -lab reviewed -PET  scan schedule 9/5 -next dose Nivo in a week  -palliative care referral    SUMMARY OF ONCOLOGIC HISTORY: Oncology  History  Adenocarcinoma of stomach (HCC)  10/01/2022 Initial Diagnosis   Adenocarcinoma of stomach (HCC)   10/01/2022 Pathology Results   Diagnosis 1. Stomach, biopsy, gastric mass, cold biopsy - POORLY DIFFERENTIATED ADENOCARCINOMA WITH SIGNET RING CELL FEATURES.  2. Stomach- Cardia, cold biopsy - OXYNTIC MUCOSA WITH LAMINA PROPRIA EDEMA. - NEGATIVE FOR MALIGNANCY ON THE SECTIONS EXAMINED   10/11/2022 Imaging    IMPRESSION: 1. Gastric wall thickening, consistent with known gastric cancer. Perigastric fat stranding, concerning for local invasion. 2. New prominent subcentimeter gastrohepatic ligament and left para-aortic lymph nodes, concerning for nodal metastatic disease. 3. Mild aortic Atherosclerosis (ICD10-I70.0).     11/07/2022 -  Chemotherapy   Patient is on Treatment Plan : LUNG NSCLC Nivolumab (3) D1,15,29 + Ipilimumab (1) D1 q42d        INTERVAL HISTORY:  Steven Wolf is here for a follow up of Gastric cancer. He was last seen by me on 11/07/2022. He presents to the clinic accompanied by wife. Pt reports that he still has some nausea and vomiting  at 3x a day. Pt reports of doing well is first treatment, just some fatigue. Pt state that he has not been taking the Zofran.       All other systems were reviewed with the patient and are negative.  MEDICAL HISTORY:  Past Medical History:  Diagnosis Date   Anxiety    Benign prostatic hypertrophy    Diverticulosis of colon    GERD (gastroesophageal reflux disease)    Gilbert syndrome    History of kidney stones    Hypertension    Microscopic hematuria    Motion sickness  ocean boats, curvy roads   Nephrolithiasis     X 2; Dr Aldean Ast   Sleep apnea    USES CPAP   Syncope 02/2007   NOCTURNAL POST MICTURTION   Urticaria     SURGICAL HISTORY: Past Surgical History:  Procedure Laterality Date   APPENDECTOMY     BIOPSY  10/16/2022   Procedure: BIOPSY;  Surgeon: Lemar Lofty., MD;  Location: [facility]  ENDOSCOPY;  Service: Gastroenterology;;   COLONOSCOPY  2010   NORMAL(pt states 2 other colonoscopies in the past)   COLONOSCOPY W/ POLYPECTOMY  2002   Tics 2005 & 2010; due 2020, Dr Jarold Motto   ESOPHAGOGASTRODUODENOSCOPY (EGD) WITH PROPOFOL N/A 09/30/2022   Procedure: ESOPHAGOGASTRODUODENOSCOPY (EGD) WITH BIOPSY;  Surgeon: Midge Minium, MD;  Location: [facility];  Service: Endoscopy;  Laterality: N/A;   ESOPHAGOGASTRODUODENOSCOPY (EGD) WITH PROPOFOL N/A 10/16/2022   Procedure: ESOPHAGOGASTRODUODENOSCOPY (EGD) WITH PROPOFOL;  Surgeon: Meridee Score Netty Starring., MD;  Location: [facility] ENDOSCOPY;  Service: Gastroenterology;  Laterality: N/A;   EUS N/A 10/16/2022   Procedure: UPPER ENDOSCOPIC ULTRASOUND (EUS) RADIAL;  Surgeon: Lemar Lofty., MD;  Location: [facility] ENDOSCOPY;  Service: Gastroenterology;  Laterality: N/A;   LAPAROSCOPY N/A 10/16/2022   Procedure: DIAGNOSTIC LAPAROSCOPY, OPEN J TUBE PLACEMENT;  Surgeon: Almond Lint, MD;  Location: [facility] ORS;  Service: General;  Laterality: N/A;   LIVER BIOPSY     X 1 for elevated LFTs with NEGATIVE  hepatitis serologies   PORTACATH PLACEMENT N/A 10/16/2022   Procedure: INSERTION PORT-A-CATH;  Surgeon: Almond Lint, MD;  Location: [facility];  Service: General;  Laterality: N/A;   TONSILLECTOMY     TONSILLECTOMY     PT STATES HAS HAD IT TWICE   UPPER GASTROINTESTINAL ENDOSCOPY  1995    I have reviewed the social history and family history with the patient and they are unchanged from previous note.  ALLERGIES:  is allergic to ramipril and penicillins.  MEDICATIONS:  Current Outpatient Medications  Medication Sig Dispense Refill   Accu-Chek Softclix Lancets lancets Use to test blood sugar 4 times a day 100 each 0   blood glucose meter kit and supplies KIT Dispense based on patient and insurance preference. Use up to four times daily as directed. 1 each 0   Blood Glucose Monitoring Suppl (ACCU-CHEK GUIDE) w/Device KIT Use to test blood sugar 4  times a day 1 kit 0   glucose blood (ACCU-CHEK GUIDE) test strip Use to test blood sugar 4 times a day 100 each 0   insulin aspart (NOVOLOG) 100 UNIT/ML FlexPen Inject  into the skin according to sliding scale: If CBG 121-150 inject 2 units,151-200 3 units,201-250 5 units,251-300 8 units,301-350 11 units,351-400 15 units,over 400 call MD 6 mL 0   insulin aspart (NOVOLOG) 100 UNIT/ML injection Inject 0-15 Units into the skin 4 (four) times daily. Correction coverage: Moderate (average weight, post-op)  CBG < 70: Implement Hypoglycemia Standing Orders and refer to Hypoglycemia Standing Orders sidebar report  CBG 70 - 120:             0 units  CBG 121 - 150: 2 units  CBG 151 - 200: 3 units  CBG 201 - 250: 5 units  CBG 251 - 300: 8 units  CBG 301 - 350: 11 units  CBG 351 - 400: 15 units  CBG > 400 call MD 10 mL 0   insulin aspart (NOVOLOG) 100 UNIT/ML injection Inject  into the skin according to sliding scale: If CBG 121-150 inject 2 units,151-200  3 units,201-250 5 units,251-300 8 units,301-350 11 units,351-400 15 units,over 400 call MD 10 mL 0   Insulin Pen Needle (TECHLITE PLUS PEN NEEDLES) 32G X 4 MM MISC Use to inject insulin as directed 100 each 0   Insulin Syringe-Needle U-100 31G X 5/16" 0.3 ML MISC Use to inject insulin 4 times a day 100 each 0   Omeprazole-Sodium Bicarbonate 2-84 MG/ML SUSR Take 20 mLs by mouth daily. 300 mL 1   ondansetron (ZOFRAN-ODT) 4 MG disintegrating tablet Dissolve 1-2 tablets (4-8 mg total) by mouth every 8 (eight) hours as needed for nausea or vomiting. 30 tablet 2   pantoprazole (PROTONIX) 40 MG injection Inject 40 mg into the vein daily. (Patient not taking: Reported on 11/04/2022) 30 each 0   PRESCRIPTION MEDICATION CPAP- At bedtime as determined by the patient (Patient not taking: Reported on 11/04/2022)     No current facility-administered medications for this visit.    PHYSICAL EXAMINATION: ECOG PERFORMANCE STATUS: 3 - Symptomatic, >50% confined to  bed  Vitals:   11/13/22 0904  BP: (!) 115/92  Pulse: (!) 125  Resp: 18  Temp: 97.8 F (36.6 C)  SpO2: 99%   Wt Readings from Last 3 Encounters:  11/13/22 159 lb 6.4 oz (72.3 kg)  11/03/22 176 lb 2.4 oz (79.9 kg)  10/14/22 159 lb 4.8 oz (72.3 kg)     GENERAL:alert, no distress and comfortable SKIN: skin color normal, no rashes or significant lesions EYES: normal, Conjunctiva are pink and non-injected, sclera clear  NEURO: alert & oriented x 3 with fluent speech LABORATORY DATA:  I have reviewed the data as listed    Latest Ref Rng & Units 11/13/2022    8:29 AM 11/07/2022    7:37 AM 11/02/2022    6:24 AM  CBC  WBC 4.0 - 10.5 K/uL 18.8  13.7  10.6   Hemoglobin 13.0 - 17.0 g/dL 19.1  47.8  29.5   Hematocrit 39.0 - 52.0 % 38.2  37.7  35.7   Platelets 150 - 400 K/uL 283  386  296         Latest Ref Rng & Units 11/13/2022    8:29 AM 11/07/2022    7:37 AM 11/03/2022    5:53 AM  CMP  Glucose 70 - 99 mg/dL 621  88  308   BUN 8 - 23 mg/dL 32  29  25   Creatinine 0.61 - 1.24 mg/dL 6.57  8.46  9.62   Sodium 135 - 145 mmol/L 135  136  132   Potassium 3.5 - 5.1 mmol/L 4.5  4.5  4.2   Chloride 98 - 111 mmol/L 101  100  98   CO2 22 - 32 mmol/L 30  29  25    Calcium 8.9 - 10.3 mg/dL 8.6  8.3  8.2   Total Protein 6.5 - 8.1 g/dL 6.2  6.2  5.3   Total Bilirubin 0.3 - 1.2 mg/dL 1.2  1.1  0.5   Alkaline Phos 38 - 126 U/L 107  162  140   AST 15 - 41 U/L 13  17  21    ALT 0 - 44 U/L 12  22  23        RADIOGRAPHIC STUDIES: I have personally reviewed the radiological images as listed and agreed with the findings in the report. No results found.    Orders Placed This Encounter  Procedures   CBC with Differential (Cancer Center Only)    Standing Status:   Future  Standing Expiration Date:   11/21/2023   CMP (Cancer Center only)    Standing Status:   Future    Standing Expiration Date:   11/21/2023   Amb Referral to Palliative Care    Referral Priority:   Routine    Referral Type:    Consultation    Number of Visits Requested:   1   All questions were answered. The patient knows to call the clinic with any problems, questions or concerns. No barriers to learning was detected. The total time spent in the appointment was 20 minutes.     [name], MD 11/13/2022   Carolin Coy, CMA, am acting as scribe for [name], MD.   I have reviewed the above documentation for accuracy and completeness, and I agree with the above.

## 2022-11-13 NOTE — Progress Notes (Signed)
Patients wife called in stating that the patient had IV hydration today and he went home an noticed a rash has broken out along his belt line and on his buttocks. Wife stated he only got fluid and zofran. I spoke with Santiago Glad NP she stated for the patient to take 1 benadryl and Pepsid 40 mg crushed and added to his feeding tube. Wife stated she will have a picture taken and sent to his mychart so doctor can see the rash.

## 2022-11-13 NOTE — Patient Instructions (Signed)
Rehydration, Adult  Rehydration is the replacement of fluids, salts, and minerals in the body (electrolytes) that are lost during dehydration. Dehydration is when there is not enough water or other fluids in the body. This happens when you lose more fluids than you take in. People who are age 69 or older have a higher risk of dehydration than younger adults. This is because in older age, the body: Is less able to maintain the right amount of water. Does not respond to temperature changes as well. Does not get a sense of thirst as easily or quickly. Other causes include: Not drinking enough fluids. This can occur when you are ill, when you forget to drink, or when you are doing activities that require a lot of energy, especially in hot weather. Conditions that cause loss of water or other fluids. These include diarrhea, vomiting, sweating, or urinating a lot. Other illnesses, such as fever or infection. Certain medicines, such as those that remove excess fluid from the body (diuretics). Symptoms of mild or moderate dehydration may include thirst, dry lips and mouth, and dizziness. Symptoms of severe dehydration may include increased heart rate, confusion, fainting, and not urinating. In severe cases, you may need to get fluids through an IV at the hospital. For mild or moderate cases, you can usually rehydrate at home by drinking certain fluids as told by your health care provider. What are the risks? Rehydration is usually safe. Taking in too much fluid (overhydration) can be a problem but is rare. Overhydration can cause an imbalance of electrolytes in the body, kidney failure, fluid in the lungs, or a decrease in salt (sodium) levels in the body. Supplies needed: You will need an oral rehydration solution (ORS) if your health care provider tells you to use one. This is a drink to treat dehydration. It can be found in pharmacies and retail stores. How to rehydrate Fluids Follow instructions from  your health care provider about what to drink. The kind of fluid and the amount you should drink depend on your condition. In general, you should choose drinks that you prefer. If told by your health care provider, drink an ORS. Make an ORS by following instructions on the package. Start by drinking small amounts, about  cup (120 mL) every 5-10 minutes. Slowly increase how much you drink until you have taken in the amount recommended by your health care provider. Drink enough clear fluids to keep your urine pale yellow. If you were told to drink an ORS, finish it first, then start slowly drinking other clear fluids. Drink fluids such as: Water. This includes sparkling and flavored water. Drinking only water can lead to having too little sodium in your body (hyponatremia). Follow the advice of your health care provider. Water from ice chips you suck on. Fruit juice with water added to it(diluted). Sports drinks. Hot or cold herbal teas. Broth-based soups. Coffee. Milk or milk products. Food Follow instructions from your health care provider about what to eat while you rehydrate. Your health care provider may recommend that you slowly begin eating regular foods in small amounts. Eat foods that contain a healthy balance of electrolytes, such as bananas, oranges, potatoes, tomatoes, and spinach. Avoid foods that are greasy or contain a lot of sugar. In some cases, you may get nutrition through a feeding tube that is passed through your nose and into your stomach (nasogastric tube, or NG tube). This may be done if you have uncontrolled vomiting or diarrhea. Drinks to avoid  Certain drinks may make dehydration worse. While you rehydrate, avoid drinking alcohol. How to tell if you are recovering from dehydration You may be getting better if: You are urinating more often than before you started rehydrating. Your urine is pale yellow. Your energy level improves. You vomit less often. You have  diarrhea less often. Your appetite improves or returns to normal. You feel less dizzy or light-headed. Your skin tone and color start to look more normal. Follow these instructions at home: Take over-the-counter and prescription medicines only as told by your health care provider. Do not take sodium tablets. Doing this can lead to having too much sodium in your body (hypernatremia). Contact a health care provider if: You continue to have symptoms of mild or moderate dehydration, such as: Thirst. Dry lips. Slightly dry mouth. Dizziness. Dark urine or less urine than usual. Muscle cramps. You continue to vomit or have diarrhea. Get help right away if: You have symptoms of dehydration that get worse. You have a fever. You have a severe headache. You have been vomiting and have problems, such as: Your vomiting gets worse. Your vomit includes blood or green matter (bile). You cannot eat or drink without vomiting. You have problems with urination or bowel movements, such as: Diarrhea that gets worse. Blood in your stool (feces). This may cause stool to look black and tarry. Not urinating, or urinating only a small amount of very dark urine, within 6-8 hours. You have trouble breathing. You have symptoms that get worse with treatment. These symptoms may be an emergency. Get help right away. Call 911. Do not wait to see if the symptoms will go away. Do not drive yourself to the hospital. This information is not intended to replace advice given to you by your health care provider. Make sure you discuss any questions you have with your health care provider. Document Revised: 07/25/2021 Document Reviewed: 07/23/2021 Elsevier Patient Education  2024 [employer].

## 2022-11-14 ENCOUNTER — Ambulatory Visit: Payer: Medicare PPO

## 2022-11-14 ENCOUNTER — Ambulatory Visit: Payer: Medicare PPO | Admitting: Hematology

## 2022-11-14 ENCOUNTER — Other Ambulatory Visit: Payer: Medicare PPO

## 2022-11-14 ENCOUNTER — Other Ambulatory Visit: Payer: Self-pay

## 2022-11-14 DIAGNOSIS — G4733 Obstructive sleep apnea (adult) (pediatric): Secondary | ICD-10-CM | POA: Diagnosis not present

## 2022-11-15 DIAGNOSIS — Z452 Encounter for adjustment and management of vascular access device: Secondary | ICD-10-CM | POA: Diagnosis not present

## 2022-11-15 DIAGNOSIS — C169 Malignant neoplasm of stomach, unspecified: Secondary | ICD-10-CM | POA: Diagnosis not present

## 2022-11-15 DIAGNOSIS — G893 Neoplasm related pain (acute) (chronic): Secondary | ICD-10-CM | POA: Diagnosis not present

## 2022-11-15 DIAGNOSIS — K632 Fistula of intestine: Secondary | ICD-10-CM | POA: Diagnosis not present

## 2022-11-15 DIAGNOSIS — Z434 Encounter for attention to other artificial openings of digestive tract: Secondary | ICD-10-CM | POA: Diagnosis not present

## 2022-11-15 DIAGNOSIS — K311 Adult hypertrophic pyloric stenosis: Secondary | ICD-10-CM | POA: Diagnosis not present

## 2022-11-15 DIAGNOSIS — I10 Essential (primary) hypertension: Secondary | ICD-10-CM | POA: Diagnosis not present

## 2022-11-15 DIAGNOSIS — E43 Unspecified severe protein-calorie malnutrition: Secondary | ICD-10-CM | POA: Diagnosis not present

## 2022-11-16 DIAGNOSIS — C169 Malignant neoplasm of stomach, unspecified: Secondary | ICD-10-CM | POA: Diagnosis not present

## 2022-11-16 DIAGNOSIS — K311 Adult hypertrophic pyloric stenosis: Secondary | ICD-10-CM | POA: Diagnosis not present

## 2022-11-18 ENCOUNTER — Inpatient Hospital Stay: Payer: Medicare PPO

## 2022-11-18 ENCOUNTER — Inpatient Hospital Stay (HOSPITAL_BASED_OUTPATIENT_CLINIC_OR_DEPARTMENT_OTHER): Payer: Medicare PPO | Admitting: Nurse Practitioner

## 2022-11-18 ENCOUNTER — Encounter: Payer: Self-pay | Admitting: Nurse Practitioner

## 2022-11-18 ENCOUNTER — Ambulatory Visit (HOSPITAL_COMMUNITY)
Admission: RE | Admit: 2022-11-18 | Discharge: 2022-11-18 | Disposition: A | Discharge status: Home / Self Care | Payer: Medicare PPO | Source: Ambulatory Visit | Attending: Nurse Practitioner | Admitting: Nurse Practitioner

## 2022-11-18 ENCOUNTER — Inpatient Hospital Stay: Payer: Medicare PPO | Admitting: Genetic Counselor

## 2022-11-18 ENCOUNTER — Inpatient Hospital Stay: Payer: Medicare PPO | Admitting: Nutrition

## 2022-11-18 VITALS — BP 114/87 | HR 126 | Resp 18

## 2022-11-18 VITALS — BP 115/95 | HR 136 | Temp 97.8°F | Resp 16 | Wt 168.9 lb

## 2022-11-18 DIAGNOSIS — R110 Nausea: Secondary | ICD-10-CM | POA: Diagnosis not present

## 2022-11-18 DIAGNOSIS — E43 Unspecified severe protein-calorie malnutrition: Secondary | ICD-10-CM | POA: Diagnosis not present

## 2022-11-18 DIAGNOSIS — Z79899 Other long term (current) drug therapy: Secondary | ICD-10-CM | POA: Diagnosis not present

## 2022-11-18 DIAGNOSIS — Z515 Encounter for palliative care: Secondary | ICD-10-CM

## 2022-11-18 DIAGNOSIS — R530 Neoplastic (malignant) related fatigue: Secondary | ICD-10-CM | POA: Diagnosis not present

## 2022-11-18 DIAGNOSIS — K311 Adult hypertrophic pyloric stenosis: Secondary | ICD-10-CM | POA: Diagnosis not present

## 2022-11-18 DIAGNOSIS — R630 Anorexia: Secondary | ICD-10-CM

## 2022-11-18 DIAGNOSIS — Z7189 Other specified counseling: Secondary | ICD-10-CM

## 2022-11-18 DIAGNOSIS — C786 Secondary malignant neoplasm of retroperitoneum and peritoneum: Secondary | ICD-10-CM | POA: Diagnosis not present

## 2022-11-18 DIAGNOSIS — C169 Malignant neoplasm of stomach, unspecified: Secondary | ICD-10-CM

## 2022-11-18 DIAGNOSIS — K5900 Constipation, unspecified: Secondary | ICD-10-CM | POA: Diagnosis not present

## 2022-11-18 DIAGNOSIS — D72829 Elevated white blood cell count, unspecified: Secondary | ICD-10-CM | POA: Insufficient documentation

## 2022-11-18 DIAGNOSIS — R634 Abnormal weight loss: Secondary | ICD-10-CM

## 2022-11-18 DIAGNOSIS — Z5112 Encounter for antineoplastic immunotherapy: Secondary | ICD-10-CM | POA: Diagnosis not present

## 2022-11-18 DIAGNOSIS — R000 Tachycardia, unspecified: Secondary | ICD-10-CM | POA: Diagnosis not present

## 2022-11-18 DIAGNOSIS — E860 Dehydration: Secondary | ICD-10-CM | POA: Diagnosis not present

## 2022-11-18 DIAGNOSIS — Z452 Encounter for adjustment and management of vascular access device: Secondary | ICD-10-CM | POA: Diagnosis not present

## 2022-11-18 LAB — CBC WITH DIFFERENTIAL (CANCER CENTER ONLY)
Abs Immature Granulocytes: 0.48 10*3/uL — ABNORMAL HIGH (ref 0.00–0.07)
Basophils Absolute: 0.1 10*3/uL (ref 0.0–0.1)
Basophils Relative: 0 %
Eosinophils Absolute: 0.1 10*3/uL (ref 0.0–0.5)
Eosinophils Relative: 1 %
HCT: 36.2 % — ABNORMAL LOW (ref 39.0–52.0)
Hemoglobin: 12 g/dL — ABNORMAL LOW (ref 13.0–17.0)
Immature Granulocytes: 2 %
Lymphocytes Relative: 6 %
Lymphs Abs: 1.3 10*3/uL (ref 0.7–4.0)
MCH: 29.4 pg (ref 26.0–34.0)
MCHC: 33.1 g/dL (ref 30.0–36.0)
MCV: 88.7 fL (ref 80.0–100.0)
Monocytes Absolute: 1.9 10*3/uL — ABNORMAL HIGH (ref 0.1–1.0)
Monocytes Relative: 9 %
Neutro Abs: 16.7 10*3/uL — ABNORMAL HIGH (ref 1.7–7.7)
Neutrophils Relative %: 82 %
Platelet Count: 321 10*3/uL (ref 150–400)
RBC: 4.08 MIL/uL — ABNORMAL LOW (ref 4.22–5.81)
RDW: 13.6 % (ref 11.5–15.5)
WBC Count: 20.5 10*3/uL — ABNORMAL HIGH (ref 4.0–10.5)
nRBC: 0 % (ref 0.0–0.2)

## 2022-11-18 LAB — CMP (CANCER CENTER ONLY)
ALT: 9 U/L (ref 0–44)
AST: 15 U/L (ref 15–41)
Albumin: 2.7 g/dL — ABNORMAL LOW (ref 3.5–5.0)
Alkaline Phosphatase: 101 U/L (ref 38–126)
Anion gap: 7 (ref 5–15)
BUN: 34 mg/dL — ABNORMAL HIGH (ref 8–23)
CO2: 27 mmol/L (ref 22–32)
Calcium: 8.6 mg/dL — ABNORMAL LOW (ref 8.9–10.3)
Chloride: 101 mmol/L (ref 98–111)
Creatinine: 0.67 mg/dL (ref 0.61–1.24)
GFR, Estimated: >60 mL/min (ref >60)
Glucose, Bld: 140 mg/dL — ABNORMAL HIGH (ref 70–99)
Potassium: 4.6 mmol/L (ref 3.5–5.1)
Sodium: 135 mmol/L (ref 135–145)
Total Bilirubin: 1 mg/dL (ref 0.3–1.2)
Total Protein: 6 g/dL — ABNORMAL LOW (ref 6.5–8.1)

## 2022-11-18 LAB — URINALYSIS, COMPLETE (UACMP) WITH MICROSCOPIC
Bilirubin Urine: NEGATIVE
Glucose, UA: NEGATIVE mg/dL
Hgb urine dipstick: NEGATIVE
Ketones, ur: NEGATIVE mg/dL
Leukocytes,Ua: NEGATIVE
Nitrite: NEGATIVE
Protein, ur: 30 mg/dL — AB
Specific Gravity, Urine: 1.031 — ABNORMAL HIGH (ref 1.005–1.030)
pH: 5 (ref 5.0–8.0)

## 2022-11-18 MED ORDER — SODIUM CHLORIDE 0.9 % IV SOLN: Freq: Once | INTRAVENOUS | Status: AC

## 2022-11-18 NOTE — Patient Instructions (Addendum)
-   for your fluids continue with 1/2 Liter of normal saline ( ) for Tuesday, for Wednesday you may do 1 Liter ( ) and we will recheck you on Thursday during your immunotherapy appointment - restart Prozac - open Prilosec capsules and sprinkle in a small amount of water in order to take the medication - restart valsartan  - continue to take your Zofran dn place under your tongue for nausea relief  - call the office at [phone] with any questions or concerns

## 2022-11-18 NOTE — Progress Notes (Signed)
This RN Spoke with Clayborn Heron, NP regarding this pt IVFs. Per NP, pt may receive 1L NS over 1 hour instead of his home infusion, provided he did not receive a liter over the weekend. Patient to continue home IVF as scheduled starting on Wednesday 8/28 with a daily blous over 30 mins.   RN discussed above parameters with pt wife who confirmed that pt did have 1L of fluids on Sunday. Per Santiago Glad, NP ok for IVF today pt labs drawn, urine collected, and pt/wife instructed to infuse 1/2L NS on Tuesday, 1L NS on Wednesday, and labs to be re-drawn on Thursday for infusion appointment. Pt and wife verbalized understanding. This RN also preformed dressing change on J-tube site (pt to go to [facility] [place] [facility] after appointments) and port needle/dressing was changed out (see flowsheets). Pt was d/c'ed to lobby in stable condition with port saliene locked (to use at home). Pt to go to [facility] for STAT chest X-ray prior to appointment with [facility] [place] [facility]. Pt and wife all agreeable to plan as stated above, RN updated Clayborn Heron, NP and Lowella Bandy, palliative NP. Genetics appointment to be rescheduled as confirmed with Maylon Cos

## 2022-11-18 NOTE — Progress Notes (Signed)
Nutrition follow-up completed with patient and wife in symptom management clinic.  Patient was tachycardic with leukocytosis.  He was diagnosed with gastric cancer and is followed by Dr. Mosetta Putt.  Current weight documented as 168 pounds 14.4 ounces August 26.  This is improved from 160 pounds 3 ounces July 17.  Of note, patient's weight has fluctuated a lot over the last month.  Labs on August 28 include glucose 140, BUN 34, creatinine 0.67, albumin 2.7.  Estimated nutrition needs: 2400-2600 cal, 110-130 g protein, rater than 2.5 L fluid.  Patient had an extended hospital stay for J-tube placement.  He was found to have diffuse peritoneal metastasis.  Patient did not tolerate enteral nutrition through his jejunum.  He was diagnosed with EC fistula and gastric outlet obstruction.  He was diagnosed with severe malnutrition during his hospital stay and was started on TPN on August 1.  He has not used his feeding tube.  He reports he and his wife were not instructed on how to care for tube.He has not flushed his feeding tube with water.  He was told he could have some clear liquids but that he should not use his feeding tube at this time. He reports some nausea and vomiting.  He is taking very little water by mouth.  Along with RN, observed jejunostomy feeding site.  Noted some brown/yellow liquid leaking out of the bottom of his feeding tube.  The bumper remains stitched to patient's skin but it is pulled away from the skin on the abdomen.  The skin around his feeding tube is red and irritated.  RN able to clean jejunostomy feeding tube site.  Noted patient was sent to x-ray for a stat chest x-ray and then will meet with a member of [facility] [place] [facility] for jejunostomy tube evaluation.  Nutrition diagnosis: Unintended weight loss improved.  Severe malnutrition in the context of chronic illness as evidenced by percent weight loss, energy intake less than or equal to 75% for greater or equal to 1 month and  mild fat depletion.  Intervention: Educated patient and wife on how to clean feeding tube site using warm water and a cotton swab.  Explained how to use gauze to help with drainage.  Provided cotton "binder" to help his feeding tube stay in place.  Also provided additional cleaning supplies.  RN demonstrated steps of cleaning feeding tube for patient. Patient to continue oral diet per surgery. Continue TPN until oral intake/tube feedings can be advanced.  Monitoring, evaluation, goals: Patient will tolerate adequate calories and protein to minimize weight loss and promote healing.  Will transition to oral diet/tube feeding per surgery.  Next visit: Thursday, August 29 during infusion.  **Disclaimer: This note was dictated with voice recognition software. Similar sounding words can inadvertently be transcribed and this note may contain transcription errors which may not have been corrected upon publication of note.**

## 2022-11-18 NOTE — Progress Notes (Unsigned)
Palliative Medicine [facility]  [phone] Fax:([phone]) [phone]   Name: Steven Wolf Date: 11/18/2022 MRN: [id number]  DOB: 12-Aug-1955  Patient Care Team: Pincus Sanes, MD as PCP - General (Internal Medicine) Glendale Chard, DO as Consulting Physician (Neurology) [name], MD as Consulting Physician (Hematology and Oncology)    REASON FOR CONSULTATION: Steven Wolf is a 67 y.o. 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  Palliative ask to see for symptom management and goals of care.    SOCIAL HISTORY:     reports that he has never smoked. He has never used smokeless tobacco. He reports that he does not drink alcohol and does not use drugs.  ADVANCE DIRECTIVES:  Advanced directives on file naming Steven Wolf and Steven Wolf as patient's first and second medical decision makers respectively should the patient become unable to advocate for himself.   CODE STATUS: Full code  PAST MEDICAL HISTORY: Past Medical History:  Diagnosis Date   Anxiety    Benign prostatic hypertrophy    Diverticulosis of colon    GERD (gastroesophageal reflux disease)    Sullivan Lone syndrome    History of kidney stones    Hypertension    Microscopic hematuria    Motion sickness    ocean boats, curvy roads   Nephrolithiasis     X 2; Dr Aldean Ast   Sleep apnea    USES CPAP   Syncope 02/2007   NOCTURNAL POST MICTURTION   Urticaria     PAST SURGICAL HISTORY:  Past Surgical History:  Procedure Laterality Date   APPENDECTOMY     BIOPSY  10/16/2022   Procedure: BIOPSY;  Surgeon: Lemar Lofty., MD;  Location: [facility];  Service: Gastroenterology;;   COLONOSCOPY  2010   NORMAL(pt states 2 other colonoscopies in the past)   COLONOSCOPY W/ POLYPECTOMY  2002   Tics 2005 &  2010; due 2020, Dr Jarold Motto   ESOPHAGOGASTRODUODENOSCOPY (EGD) WITH PROPOFOL N/A 09/30/2022   Procedure: ESOPHAGOGASTRODUODENOSCOPY (EGD) WITH BIOPSY;  Surgeon: Midge Minium, MD;  Location: [facility];  Service: Endoscopy;  Laterality: N/A;   ESOPHAGOGASTRODUODENOSCOPY (EGD) WITH PROPOFOL N/A 10/16/2022   Procedure: ESOPHAGOGASTRODUODENOSCOPY (EGD) WITH PROPOFOL;  Surgeon: Meridee Score Netty Starring., MD;  Location: [facility];  Service: Gastroenterology;  Laterality: N/A;   EUS N/A 10/16/2022   Procedure: UPPER ENDOSCOPIC ULTRASOUND (EUS) RADIAL;  Surgeon: Lemar Lofty., MD;  Location: [facility];  Service: Gastroenterology;  Laterality: N/A;   LAPAROSCOPY N/A 10/16/2022   Procedure: DIAGNOSTIC LAPAROSCOPY, OPEN J TUBE PLACEMENT;  Surgeon: Almond Lint, MD;  Location: [facility];  Service: General;  Laterality: N/A;   LIVER BIOPSY     X 1 for elevated LFTs with NEGATIVE  hepatitis serologies   PORTACATH PLACEMENT N/A 10/16/2022   Procedure: INSERTION PORT-A-CATH;  Surgeon: Almond Lint, MD;  Location: [facility];  Service: General;  Laterality: N/A;   TONSILLECTOMY     TONSILLECTOMY     PT STATES HAS HAD IT TWICE   UPPER GASTROINTESTINAL ENDOSCOPY  1995    HEMATOLOGY/ONCOLOGY HISTORY:  Oncology History  Adenocarcinoma of stomach (HCC)  10/01/2022 Initial Diagnosis   Adenocarcinoma of stomach (HCC)   10/01/2022 Pathology Results   Diagnosis 1. Stomach, biopsy, gastric mass, cold biopsy - POORLY DIFFERENTIATED ADENOCARCINOMA WITH SIGNET RING CELL FEATURES.  2. Stomach- Cardia, cold biopsy - OXYNTIC MUCOSA WITH  LAMINA PROPRIA EDEMA. - NEGATIVE FOR MALIGNANCY ON THE SECTIONS EXAMINED   10/11/2022 Imaging    IMPRESSION: 1. Gastric wall thickening, consistent with known gastric cancer. Perigastric fat stranding, concerning for local invasion. 2. New prominent subcentimeter gastrohepatic ligament and left para-aortic lymph nodes, concerning for nodal metastatic disease. 3. Mild  aortic Atherosclerosis (ICD10-I70.0).     11/07/2022 -  Chemotherapy   Patient is on Treatment Plan : LUNG NSCLC Nivolumab (3) D1,15,29 + Ipilimumab (1) D1 q42d       ALLERGIES:  is allergic to ramipril and penicillins.  MEDICATIONS:  Current Outpatient Medications  Medication Sig Dispense Refill   Accu-Chek Softclix Lancets lancets Use to test blood sugar 4 times a day 100 each 0   blood glucose meter kit and supplies KIT Dispense based on patient and insurance preference. Use up to four times daily as directed. 1 each 0   Blood Glucose Monitoring Suppl (ACCU-CHEK GUIDE) w/Device KIT Use to test blood sugar 4 times a day 1 kit 0   glucose blood (ACCU-CHEK GUIDE) test strip Use to test blood sugar 4 times a day 100 each 0   insulin aspart (NOVOLOG) 100 UNIT/ML FlexPen Inject  into the skin according to sliding scale: If CBG 121-150 inject 2 units,151-200 3 units,201-250 5 units,251-300 8 units,301-350 11 units,351-400 15 units,over 400 call MD 6 mL 0   insulin aspart (NOVOLOG) 100 UNIT/ML injection Inject 0-15 Units into the skin 4 (four) times daily. Correction coverage: Moderate (average weight, post-op)  CBG < 70: Implement Hypoglycemia Standing Orders and refer to Hypoglycemia Standing Orders sidebar report  CBG 70 - 120:             0 units  CBG 121 - 150: 2 units  CBG 151 - 200: 3 units  CBG 201 - 250: 5 units  CBG 251 - 300: 8 units  CBG 301 - 350: 11 units  CBG 351 - 400: 15 units  CBG > 400 call MD 10 mL 0   insulin aspart (NOVOLOG) 100 UNIT/ML injection Inject  into the skin according to sliding scale: If CBG 121-150 inject 2 units,151-200 3 units,201-250 5 units,251-300 8 units,301-350 11 units,351-400 15 units,over 400 call MD 10 mL 0   Insulin Pen Needle (TECHLITE PLUS PEN NEEDLES) 32G X 4 MM MISC Use to inject insulin as directed 100 each 0   Insulin Syringe-Needle U-100 31G X 5/16" 0.3 ML MISC Use to inject insulin 4 times a day 100 each 0   Omeprazole-Sodium Bicarbonate  2-84 MG/ML SUSR Take 20 mLs by mouth daily. 300 mL 1   ondansetron (ZOFRAN-ODT) 4 MG disintegrating tablet Dissolve 1-2 tablets (4-8 mg total) by mouth every 8 (eight) hours as needed for nausea or vomiting. 30 tablet 2   pantoprazole (PROTONIX) 40 MG injection Inject 40 mg into the vein daily. (Patient not taking: Reported on 11/04/2022) 30 each 0   PRESCRIPTION MEDICATION CPAP- At bedtime as determined by the patient (Patient not taking: Reported on 11/04/2022)     No current facility-administered medications for this visit.    VITAL SIGNS: There were no vitals taken for this visit. There were no vitals filed for this visit.  Estimated body mass index is 21.62 kg/m as calculated from the following:   Height as of 10/16/22: 6' (1.829 m).   Weight as of 11/13/22: 159 lb 6.4 oz (72.3 kg).  LABS: CBC:    Component Value Date/Time   WBC 18.8 (H) 11/13/2022 0829   WBC  10.6 (H) 11/02/2022 0624   HGB 12.6 (L) 11/13/2022 0829   HCT 38.2 (L) 11/13/2022 0829   PLT 283 11/13/2022 0829   MCV 90.5 11/13/2022 0829   NEUTROABS 15.1 (H) 11/13/2022 0829   LYMPHSABS 1.3 11/13/2022 0829   MONOABS 1.7 (H) 11/13/2022 0829   EOSABS 0.2 11/13/2022 0829   BASOSABS 0.1 11/13/2022 0829   Comprehensive Metabolic Panel:    Component Value Date/Time   NA 135 11/13/2022 0829   K 4.5 11/13/2022 0829   CL 101 11/13/2022 0829   CO2 30 11/13/2022 0829   BUN 32 (H) 11/13/2022 0829   CREATININE 0.65 11/13/2022 0829   CREATININE 1.20 12/13/2019 1219   GLUCOSE 116 (H) 11/13/2022 0829   CALCIUM 8.6 (L) 11/13/2022 0829   AST 13 (L) 11/13/2022 0829   ALT 12 11/13/2022 0829   ALKPHOS 107 11/13/2022 0829   BILITOT 1.2 11/13/2022 0829   PROT 6.2 (L) 11/13/2022 0829   ALBUMIN 2.8 (L) 11/13/2022 0829    RADIOGRAPHIC STUDIES: CT ABDOMEN PELVIS W CONTRAST  Result Date: 10/28/2022 CLINICAL DATA:  Postoperative abdominal pain. EXAM: CT ABDOMEN AND PELVIS WITH CONTRAST TECHNIQUE: Multidetector CT imaging of the  abdomen and pelvis was performed using the standard protocol following bolus administration of intravenous contrast. RADIATION DOSE REDUCTION: This exam was performed according to the departmental dose-optimization program which includes automated exposure control, adjustment of the mA and/or kV according to patient size and/or use of iterative reconstruction technique. CONTRAST:  OMNIPAQUE IOHEXOL 300 MG/ML SOLN, OMNIPAQUE IOHEXOL 300 MG/ML SOLN COMPARISON:  October 23, 2022. FINDINGS: Lower chest: Mild bibasilar subsegmental atelectasis. Gas is now seen in the anterior mediastinal region. Hepatobiliary: Stable gallbladder distention. No biliary dilatation is noted. No cholelithiasis is noted. Stable hepatic cysts. Pancreas: Unremarkable. No pancreatic ductal dilatation or surrounding inflammatory changes. Spleen: Normal in size without focal abnormality. Adrenals/Urinary Tract: Adrenal glands are unremarkable. Bilateral renal cysts are noted for which no further follow-up is required. No hydronephrosis or renal obstruction. Urinary bladder is unremarkable. Stomach/Bowel: Continued gastric wall thickening and body and antrum consistent with history of gastric carcinoma. Jejunostomy tube is again noted. Mildly dilated small bowel loops are noted in the right lower quadrant with mild wall thickening, with this appears to be overall improved compared to prior exam. No colonic dilatation is noted. The pneumatosis noted on prior exam is nearly resolved, although there remains a large amount of free air both adjacent to the jejunal loops as well as in the epigastric region, which potentially is postsurgical in etiology. Status post appendectomy. There is no definite evidence of leakage of contrast. Vascular/Lymphatic: No significant vascular abnormality is noted. 8 mm gastrohepatic lymph node is noted suggesting metastatic disease. Reproductive: Stable mild prostatic enlargement. Other: Mild ascites is noted. No  hernia is noted. Free air is noted in the anterior abdominal wall which is increased compared to prior exam. Musculoskeletal: No acute or significant osseous findings. IMPRESSION: Continued gastric wall thickening of body and antrum consistent with history of gastric carcinoma. 8 mm gastrohepatic lymph node is also noted suggesting metastatic disease. Continued presence of jejunostomy tube. Pneumatosis involving jejunum on prior exam is nearly resolved, and there is significantly decreased small bowel dilatation compared to prior exam as well. However, there remains a large amount of free air adjacent to small bowel loops and in the mesentery as well as in the epigastric region. An increased amount of air is seen in the subcutaneous tissues of the anterior abdominal wall of the  right lower quadrant as well as air seen extending into the anterior mediastinum in the visualized chest. Mild ascites is noted. While this free air may be postsurgical in etiology, the possibility of bowel perforation still cannot be excluded. However, there is no definite evidence of extravasated contrast on this exam. These results will be called to the ordering clinician or representative by the Radiologist Assistant, and communication documented in the PACS or zVision Dashboard. Electronically Signed   By: Lupita Raider M.D.   On: 10/28/2022 10:43   CT ABDOMEN PELVIS W CONTRAST  Result Date: 10/23/2022 CLINICAL DATA:  Bowel obstruction suspected (Ped 5-17y) concern for pnuemotosis EXAM: CT ABDOMEN AND PELVIS WITH CONTRAST TECHNIQUE: Multidetector CT imaging of the abdomen and pelvis was performed using the standard protocol following bolus administration of intravenous contrast. RADIATION DOSE REDUCTION: This exam was performed according to the departmental dose-optimization program which includes automated exposure control, adjustment of the mA and/or kV according to patient size and/or use of iterative reconstruction technique.  CONTRAST:  OMNIPAQUE IOHEXOL 300 MG/ML  SOLN COMPARISON:  10/11/2022 FINDINGS: Lower chest: No pleural or pericardial effusion. Catheter extends to the proximal right atrium. Patchy atelectasis or infiltrates posteriorly in both lung bases, new since previous. Hepatobiliary: Stable probable cyst in hepatic segment 2. No new liver lesion or biliary ductal dilatation. Gallbladder is distended, without definite wall thickening. Pancreas: Unremarkable. No pancreatic ductal dilatation or surrounding inflammatory changes. Spleen: Normal in size without focal abnormality. Adrenals/Urinary Tract: No adrenal mass. Symmetric renal enhancement without hydronephrosis or worrisome lesion. Urinary bladder physiologically distended. Stomach/Bowel: Stomach is distended by gas and fluid. Despite that, there is wall thickening in the body and antrum as seen on prior study. The duodenum is distended. Multiple gas dilated loops of small bowel. Interval placement of jejunostomy catheter into the proximal jejunum. There is pneumatosis of proximal jejunum beginning just distal to the jejunostomy catheter and involving several loops. There is gas and fluid distending mid and distal small bowel, without other pneumatosis or wall thickening. Terminal ileum is nondilated. Ascending colon is partially distended, distal segments decompressed. Scattered distal descending and sigmoid diverticula are noted. Vascular/Lymphatic: Minimal plaque in the abdominal aorta without dissection, aneurysm, or stenosis. Portal vein patent. Gastrohepatic ligament adenopathy as previously noted. No other mesenteric, retroperitoneal, or pelvic adenopathy. Reproductive: Prostate enlargement Other: There is a small amount of free intraperitoneal intraperitoneal gas, and scattered abdominal and pelvic ascites, new since previous. Musculoskeletal: No acute or significant osseous findings. IMPRESSION: 1. Interval placement of jejunostomy catheter into the  proximal jejunum. 2. Pneumatosis of proximal jejunum beginning just distal to the jejunostomy catheter, extending into the mesentery. Mid and distal small bowel loops are distended without wall thickening or pneumatosis. 3. Small amount of free intraperitoneal gas and scattered abdominal and pelvic ascites, new since previous, potentially related to recent surgery. Critical Value/emergent results were called by telephone at the time of interpretation on 10/23/2022 at 10:36 pm to provider Jeanie Sewer, who verbally acknowledged these results. 4. Persistent gastric wall thickening in the body and antrum with Gastrohepatic ligament adenopathy as previously noted, consistent with history of gastric carcinoma. 5. Patchy atelectasis or infiltrates posteriorly in both lung bases, new since previous. 6. Prostate enlargement. 7.  Aortic Atherosclerosis (ICD10-I70.0). Electronically Signed   By: Corlis Leak M.D.   On: 10/23/2022 22:38    PERFORMANCE STATUS (ECOG) : 3 - Symptomatic, >50% confined to bed  Review of Systems  Constitutional:  Positive for activity change, appetite change  and fatigue.  Gastrointestinal:  Positive for abdominal distention.       J-tube   Neurological:  Positive for weakness.  Psychiatric/Behavioral:         Depression  Unless otherwise noted, a complete review of systems is negative.  Physical Exam General: Weak appearing Cardiovascular: Regular, Tachycardic Pulmonary: clear ant fields Abdomen: soft, nontender, + bowel sounds, midline incision healed, J-tube intact, red/irritation around insertion site, some oozing noted Extremities: no edema, no joint deformities Skin: no rashes Neurological:AAO x4  IMPRESSION: This is my initial visit with Mr. Blatchley. His wife is present. We are also administering IV fluids in office today as he usually would self administer in the home. Weak appearing. Alert and able to engage appropriately in discussions.   I introduced myself, [place]  RN, and Palliative's role in collaboration with the oncology team. Concept of Palliative Care was introduced as specialized medical care for people and their families living with serious illness.  It focuses on providing relief from the symptoms and stress of a serious illness.  The goal is to improve quality of life for both the patient and the family. Values and goals of care important to patient and family were attempted to be elicited.   Mr. Geraci lives in the home with his wife of more than 46 years. No children or pets. A retired Art gallery manager. Enjoys reading.   At home requires some assistance with ADLs due to fatigue and weakness. He spends most of his time on the couch however relates some of this to his physical condition versus his recent discontinuation of anti-depressant after hospitalization. Uses a cane for gait stability. Recent extended hospitalization with J-tube placement. He has been unable to utilize feedings via tube due to intolerance and fistula. TPN nightly over 12 hours. Self administering home IV fluids for hydration 3 times weekly. Some insomnia however is sleeping a lot during the day. Discussed focusing on sleep regimen.   Wife expresses some concerns with sedentary lifestyle as he spends most of his time in bed. Not up walking around much. Patient endorses fatigue and weakness. States he is unsure if this is related to his physical changes/health versus depression due to discontinuation of his Prozac at discharge. He has been on his anti-depressant for years and was stopped abruptly. After further discussion and collaboration with Clayborn Heron, NP agreed we will restart his Prozac if able to tolerate orally. Patient and wife agrees.   [name] states he is able to take in small amounts of water by mouth only. Otherwise remains NPO.  Able to tolerate liquid omeprazole without difficulty. Has not taken other medications. He reports daily nausea where he vomits at least 2-3 times during the  evening. He has Zofran-ODT on hand however has not taken. Encouraged to use sublingually. He verbalized understanding.   Mr. Winchell has not had a bowel movement since 8/20 ( 6days). Wife reports small bowel movement on 8/20 and 8/19. Due to being NPO has not taken any forms of stool softener. Will recommend dulcolax suppository for bowel support.    We discussed his current illness and what it means in the larger context of his on-going co-morbidities. Natural disease trajectory and expectations were discussed.  Mr. Larochelle and his wife are realistic in their understanding of current illness. Understands cancer is incurable. They are remaining hopeful for some stability however knows that his health has been a challenge. Mr. Meckley states he is not ready to give up and wishes to treat the treatable  allowing him every opportunity to thrive.   I discussed the importance of continued conversation with family and their medical providers regarding overall plan of care and treatment options, ensuring decisions are within the context of the patients values and GOCs.  PLAN: Established therapeutic relationship. Education provided on palliative's role in collaboration with their Oncology/Radiation team. Zofran-ODT for nausea Restart Prozac orally. Wife knows if unable to tolerate to contact office.  Restart Valsartan Dulcolax suppository for bowel regimen  May open omeprazole capsule and take orally versus liquid due to cost.  Ongoing goals of care support and symptom management 1L IVF in office today.   I will plan to see patient back in 1-2 weeks in collaboration to other oncology appointments.    Patient expressed understanding and was in agreement with this plan. He also understands that He can call the clinic at any time with any questions, concerns, or complaints.   Thank you for your referral and allowing Palliative to assist in Mr.Rebel S Hiltz's care.   Number and complexity of  problems addressed: HIGH - 1 or more chronic illnesses with SEVERE exacerbation, progression, or side effects of treatment - advanced cancer, pain. Any controlled substances utilized were prescribed in the context of palliative care.   Visit consisted of counseling and education dealing with the complex and emotionally intense issues of symptom management and palliative care in the setting of serious and potentially life-threatening illness.Greater than 50%  of this time was spent counseling and coordinating care related to the above assessment and plan.  Signed by: Willette Alma, AGPCNP-BC Palliative Medicine Team/[facility]   *Please note that this is a verbal dictation therefore any spelling or grammatical errors are due to the "Dragon [employer] One" system interpretation.

## 2022-11-18 NOTE — Progress Notes (Signed)
Patient Care Team: Pincus Sanes, MD as PCP - General (Internal Medicine) Glendale Chard, DO as Consulting Physician (Neurology) [name], MD as Consulting Physician (Hematology and Oncology)   CHIEF COMPLAINT: Follow up tachycardia, leukocytosis h/o gastric cancer  Oncology History  Adenocarcinoma of stomach (HCC)  10/01/2022 Initial Diagnosis   Adenocarcinoma of stomach (HCC)   10/01/2022 Pathology Results   Diagnosis 1. Stomach, biopsy, gastric mass, cold biopsy - POORLY DIFFERENTIATED ADENOCARCINOMA WITH SIGNET RING CELL FEATURES.  2. Stomach- Cardia, cold biopsy - OXYNTIC MUCOSA WITH LAMINA PROPRIA EDEMA. - NEGATIVE FOR MALIGNANCY ON THE SECTIONS EXAMINED   10/11/2022 Imaging    IMPRESSION: 1. Gastric wall thickening, consistent with known gastric cancer. Perigastric fat stranding, concerning for local invasion. 2. New prominent subcentimeter gastrohepatic ligament and left para-aortic lymph nodes, concerning for nodal metastatic disease. 3. Mild aortic Atherosclerosis (ICD10-I70.0).     11/07/2022 -  Chemotherapy   Patient is on Treatment Plan : LUNG NSCLC Nivolumab (3) D1,15,29 + Ipilimumab (1) D1 q42d        CURRENT THERAPY: Ipi/Nivo, starting 8/15  INTERVAL HISTORY Steven Wolf is seen in the symptom management clinic, while receiving fluids. Feels about the same since last visit 8/21. Has not used J tube in any way recently. Sutures are tight and there is some drainage. Abdomen feels somewhat bloated but stable lately. Managing constipation. Still vomits most days in the evening. Liquid PPI is expensive but helping burping. Remains mostly sedentary with limited activity. After ambulation feels a but winded, some cough after emesis but not much otherwise. Denies fever, chills, or leg edema. Some urinary burning.   ROS  All other systems reviewed and negative   Past Medical History:  Diagnosis Date   Anxiety    Benign prostatic hypertrophy    Diverticulosis of  colon    GERD (gastroesophageal reflux disease)    Sullivan Lone syndrome    History of kidney stones    Hypertension    Microscopic hematuria    Motion sickness    ocean boats, curvy roads   Nephrolithiasis     X 2; Dr Aldean Ast   Sleep apnea    USES CPAP   Syncope 02/2007   NOCTURNAL POST MICTURTION   Urticaria      Past Surgical History:  Procedure Laterality Date   APPENDECTOMY     BIOPSY  10/16/2022   Procedure: BIOPSY;  Surgeon: Lemar Lofty., MD;  Location: [facility];  Service: Gastroenterology;;   COLONOSCOPY  2010   NORMAL(pt states 2 other colonoscopies in the past)   COLONOSCOPY W/ POLYPECTOMY  2002   Tics 2005 & 2010; due 2020, Dr Jarold Motto   ESOPHAGOGASTRODUODENOSCOPY (EGD) WITH PROPOFOL N/A 09/30/2022   Procedure: ESOPHAGOGASTRODUODENOSCOPY (EGD) WITH BIOPSY;  Surgeon: Midge Minium, MD;  Location: [facility];  Service: Endoscopy;  Laterality: N/A;   ESOPHAGOGASTRODUODENOSCOPY (EGD) WITH PROPOFOL N/A 10/16/2022   Procedure: ESOPHAGOGASTRODUODENOSCOPY (EGD) WITH PROPOFOL;  Surgeon: Meridee Score Netty Starring., MD;  Location: [facility];  Service: Gastroenterology;  Laterality: N/A;   EUS N/A 10/16/2022   Procedure: UPPER ENDOSCOPIC ULTRASOUND (EUS) RADIAL;  Surgeon: Lemar Lofty., MD;  Location: [facility];  Service: Gastroenterology;  Laterality: N/A;   LAPAROSCOPY N/A 10/16/2022   Procedure: DIAGNOSTIC LAPAROSCOPY, OPEN J TUBE PLACEMENT;  Surgeon: Almond Lint, MD;  Location: [facility];  Service: General;  Laterality: N/A;   LIVER BIOPSY     X 1 for elevated LFTs with NEGATIVE  hepatitis serologies   PORTACATH PLACEMENT  N/A 10/16/2022   Procedure: INSERTION PORT-A-CATH;  Surgeon: Almond Lint, MD;  Location: [facility];  Service: General;  Laterality: N/A;   TONSILLECTOMY     TONSILLECTOMY     PT STATES HAS HAD IT TWICE   UPPER GASTROINTESTINAL ENDOSCOPY  1995     Outpatient Encounter Medications as of 11/18/2022  Medication Sig   Accu-Chek  Softclix Lancets lancets Use to test blood sugar 4 times a day   blood glucose meter kit and supplies KIT Dispense based on patient and insurance preference. Use up to four times daily as directed.   Blood Glucose Monitoring Suppl (ACCU-CHEK GUIDE) w/Device KIT Use to test blood sugar 4 times a day   glucose blood (ACCU-CHEK GUIDE) test strip Use to test blood sugar 4 times a day   insulin aspart (NOVOLOG) 100 UNIT/ML FlexPen Inject  into the skin according to sliding scale: If CBG 121-150 inject 2 units,151-200 3 units,201-250 5 units,251-300 8 units,301-350 11 units,351-400 15 units,over 400 call MD   insulin aspart (NOVOLOG) 100 UNIT/ML injection Inject 0-15 Units into the skin 4 (four) times daily. Correction coverage: Moderate (average weight, post-op)  CBG < 70: Implement Hypoglycemia Standing Orders and refer to Hypoglycemia Standing Orders sidebar report  CBG 70 - 120:             0 units  CBG 121 - 150: 2 units  CBG 151 - 200: 3 units  CBG 201 - 250: 5 units  CBG 251 - 300: 8 units  CBG 301 - 350: 11 units  CBG 351 - 400: 15 units  CBG > 400 call MD   insulin aspart (NOVOLOG) 100 UNIT/ML injection Inject  into the skin according to sliding scale: If CBG 121-150 inject 2 units,151-200 3 units,201-250 5 units,251-300 8 units,301-350 11 units,351-400 15 units,over 400 call MD   Insulin Pen Needle (TECHLITE PLUS PEN NEEDLES) 32G X 4 MM MISC Use to inject insulin as directed   Insulin Syringe-Needle U-100 31G X 5/16" 0.3 ML MISC Use to inject insulin 4 times a day   Omeprazole-Sodium Bicarbonate 2-84 MG/ML SUSR Take 20 mLs by mouth daily.   ondansetron (ZOFRAN-ODT) 4 MG disintegrating tablet Dissolve 1-2 tablets (4-8 mg total) by mouth every 8 (eight) hours as needed for nausea or vomiting.   pantoprazole (PROTONIX) 40 MG injection Inject 40 mg into the vein daily. (Patient not taking: Reported on 11/04/2022)   PRESCRIPTION MEDICATION CPAP- At bedtime as determined by the patient (Patient not  taking: Reported on 11/04/2022)   No facility-administered encounter medications on file as of 11/18/2022.     There were no vitals filed for this visit. There is no height or weight on file to calculate BMI.   PHYSICAL EXAM GENERAL:alert, no distress and comfortable SKIN: scattered rash to left lower abdomen and low back EYES: sclera clear  LUNGS: diminished on left, normal breathing effort HEART: tachycardic, regular rhythm, no lower extremity edema ABDOMEN: abdomen distended, non-tender. positive bowel sounds. J tube left lower abdomen with serous drainage. Sutures with mild erythema NEURO: alert & oriented x 3 with fluent speech, no focal motor/sensory deficits PAC without erythema    CBC    Component Value Date/Time   WBC 20.5 (H) 11/18/2022 1203   WBC 10.6 (H) 11/02/2022 0624   RBC 4.08 (L) 11/18/2022 1203   HGB 12.0 (L) 11/18/2022 1203   HCT 36.2 (L) 11/18/2022 1203   PLT 321 11/18/2022 1203   MCV 88.7 11/18/2022 1203   MCH 29.4  11/18/2022 1203   MCHC 33.1 11/18/2022 1203   RDW 13.6 11/18/2022 1203   LYMPHSABS 1.3 11/18/2022 1203   MONOABS 1.9 (H) 11/18/2022 1203   EOSABS 0.1 11/18/2022 1203   BASOSABS 0.1 11/18/2022 1203     CMP     Component Value Date/Time   NA 135 11/18/2022 1203   K 4.6 11/18/2022 1203   CL 101 11/18/2022 1203   CO2 27 11/18/2022 1203   GLUCOSE 140 (H) 11/18/2022 1203   BUN 34 (H) 11/18/2022 1203   CREATININE 0.67 11/18/2022 1203   CREATININE 1.20 12/13/2019 1219   CALCIUM 8.6 (L) 11/18/2022 1203   PROT 6.0 (L) 11/18/2022 1203   ALBUMIN 2.7 (L) 11/18/2022 1203   AST 15 11/18/2022 1203   ALT 9 11/18/2022 1203   ALKPHOS 101 11/18/2022 1203   BILITOT 1.0 11/18/2022 1203   GFRNONAA >60 11/18/2022 1203   GFRNONAA 63 12/13/2019 1219   GFRAA 73 12/13/2019 1219     ASSESSMENT & PLAN:Steven Wolf is a 67 y.o. male with    Tachycardia, leukocytosis -He has progressive leukocytosis, and baseline tachy but slightly worse today -He  appears mildly dehydrated, s/p IVF; BP and HR improved after NS bolus but HR still in 120's. O2 94% at rest, did not ambulate -Not on steroids -The differential also includes infection (J tube, urine, pulm or peritoneal) -No clinical sign of peritonitis. PAC appears WNL. UA with proteinuria and rare bacteria, culture is pending -I recommend CXR today after appointments -Also considering that abruptly stopping his prozac may be contributing to tachycardia, t was not given in hospital or resumed at discharge. Although no other clinical concerns for serotonin syndrome; he can resume at normal dosing.  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 -During his surgical J-tube placement, he was found to have diffuse peritoneal metastasis -He had a prolonged hospital stay after J-tube placement due to poor tolerance to J-tube, ileus, EC fistula, and he was finally discharged home with TPN -Giving this is likely Lynch syndrome, MSI high disease, he began first-line immunotherapy with nivolumab and ipilimumab, dosing per NCCN expert panel recommendation. He started on 11/07/2022 -Next appt 8/29 for Nivo; PET scheduled for 9/5   Protein-calorie malnutrition, severe -Secondary to underlying malignancy and gastric outlet obstruction -Patient has J feeding tube, but is not able to tolerate likely due to the bowel obstruction. Clear liquids only -continue TPN and IVF -F/up dietician today -Will also see surgery PA today to assess J tube, he received site care and education today   Goals of care, counseling/discussion -He understands the incurable nature of his/her cancer, and the overall poor prognosis, especially if he does not have good  response to treatment, or progress on chemo/treatment -The patient understands the goal of care is palliative. -Per GOC discussion with MD, he open to DNR and hospice when appropriate      PLAN: -Labs reviewed, urine culture and CXR are pending. If negative, possible CTA -IVF today, continue TPN -J tube eval with Dr. Arita Miss PA today -Restart fluoxetine at previous dose -Nutrition and Palliative care visits today -F/up and Nivo 8/29 -PET 9/5 as scheduled   Orders Placed This Encounter  Procedures   DG Chest 2 View  Standing Status:   Future    Number of Occurrences:   1    Standing Expiration Date:   11/18/2023    Order Specific Question:   Reason for Exam (SYMPTOM  OR DIAGNOSIS REQUIRED)    Answer:   tachy, leukocytosis, diminished left; r/o infection    Order Specific Question:   Preferred imaging location?    Answer:   [facility]      All questions were answered. The patient knows to call the clinic with any problems, questions or concerns. No barriers to learning were detected. I spent 30 minutes counseling the patient face to face. The total time spent in the appointment was 40 minutes and more than 50% was on counseling, review of test results, and coordination of care.   Santiago Glad, NP-C 11/18/2022

## 2022-11-19 ENCOUNTER — Encounter: Payer: Self-pay | Admitting: Hematology

## 2022-11-19 ENCOUNTER — Other Ambulatory Visit (HOSPITAL_COMMUNITY): Payer: Self-pay

## 2022-11-19 LAB — URINE CULTURE: Culture: NO GROWTH

## 2022-11-19 NOTE — Progress Notes (Unsigned)
REFERRING PROVIDER: [name], MD [address] [place],  [place] [address]  PRIMARY PROVIDER:  Pincus Sanes, MD  PRIMARY REASON FOR VISIT:  Encounter Diagnoses  Name Primary?   Adenocarcinoma of stomach (HCC) Yes   Family history of breast cancer     HISTORY OF PRESENT ILLNESS:   Mr. Steven Wolf, a 67 y.o. male, was seen for a [place] cancer genetics consultation at the request of Dr. Mosetta Putt due to a personal history of gastric cancer with abnormal mismatch repair status.  Mr. Ladehoff presents to clinic today to discuss the possibility of a hereditary predisposition to cancer, to discuss genetic testing, and to further clarify his future cancer risks, as well as potential cancer risks for family members.   In July 2024, at the age of 30, Mr. Cozine was diagnosed with adenocarcinoma of the stomach with signet ring features and loss of PMS2.   CANCER HISTORY:  Oncology History  Adenocarcinoma of stomach (HCC)  10/01/2022 Initial Diagnosis   Adenocarcinoma of stomach (HCC)   10/01/2022 Pathology Results   Diagnosis 1. Stomach, biopsy, gastric mass, cold biopsy - POORLY DIFFERENTIATED ADENOCARCINOMA WITH SIGNET RING CELL FEATURES.  2. Stomach- Cardia, cold biopsy - OXYNTIC MUCOSA WITH LAMINA PROPRIA EDEMA. - NEGATIVE FOR MALIGNANCY ON THE SECTIONS EXAMINED   10/11/2022 Imaging    IMPRESSION: 1. Gastric wall thickening, consistent with known gastric cancer. Perigastric fat stranding, concerning for local invasion. 2. New prominent subcentimeter gastrohepatic ligament and left para-aortic lymph nodes, concerning for nodal metastatic disease. 3. Mild aortic Atherosclerosis (ICD10-I70.0).     11/07/2022 -  Chemotherapy   Patient is on Treatment Plan : LUNG NSCLC Nivolumab (3) D1,15,29 + Ipilimumab (1) D1 q42d        Past Medical History:  Diagnosis Date   Anxiety    Benign prostatic hypertrophy    Diverticulosis of colon    GERD (gastroesophageal reflux disease)     Sullivan Lone syndrome    History of kidney stones    Hypertension    Microscopic hematuria    Motion sickness    ocean boats, curvy roads   Nephrolithiasis     X 2; Dr Aldean Ast   Sleep apnea    USES CPAP   Syncope 02/2007   NOCTURNAL POST MICTURTION   Urticaria     Past Surgical History:  Procedure Laterality Date   APPENDECTOMY     BIOPSY  10/16/2022   Procedure: BIOPSY;  Surgeon: Lemar Lofty., MD;  [facility]: [facility];  Service: Gastroenterology;;   COLONOSCOPY  2010   NORMAL(pt states 2 other colonoscopies in the past)   COLONOSCOPY W/ POLYPECTOMY  2002   Tics 2005 & 2010; due 2020, Dr Jarold Motto   ESOPHAGOGASTRODUODENOSCOPY (EGD) WITH PROPOFOL N/A 09/30/2022   Procedure: ESOPHAGOGASTRODUODENOSCOPY (EGD) WITH BIOPSY;  Surgeon: Midge Minium, MD;  Location: [facility];  Service: Endoscopy;  Laterality: N/A;   ESOPHAGOGASTRODUODENOSCOPY (EGD) WITH PROPOFOL N/A 10/16/2022   Procedure: ESOPHAGOGASTRODUODENOSCOPY (EGD) WITH PROPOFOL;  Surgeon: Meridee Score Netty Starring., MD;  Location: [facility];  Service: Gastroenterology;  Laterality: N/A;   EUS N/A 10/16/2022   Procedure: UPPER ENDOSCOPIC ULTRASOUND (EUS) RADIAL;  Surgeon: Lemar Lofty., MD;  Location: [facility];  Service: Gastroenterology;  Laterality: N/A;   LAPAROSCOPY N/A 10/16/2022   Procedure: DIAGNOSTIC LAPAROSCOPY, OPEN J TUBE PLACEMENT;  Surgeon: Almond Lint, MD;  Location: [facility];  Service: General;  Laterality: N/A;   LIVER BIOPSY     X 1 for elevated LFTs with NEGATIVE  hepatitis serologies   PORTACATH PLACEMENT N/A 10/16/2022   Procedure: INSERTION PORT-A-CATH;  Surgeon: Almond Lint, MD;  Location: [facility];  Service: General;  Laterality: N/A;   TONSILLECTOMY     TONSILLECTOMY     PT STATES HAS HAD IT TWICE   UPPER GASTROINTESTINAL ENDOSCOPY  1995    FAMILY HISTORY:  We obtained a detailed, 4-generation family history.  Significant diagnoses are listed below: Family History  Problem  Relation Age of Onset   Breast cancer Mother        dx 21s   Polycythemia Father    Polycythemia Paternal Uncle    Leukemia Paternal Uncle        dx >50   Heart attack Paternal Grandfather        > 36   Skin cancer Half-Sister        unknown type; excision only      Mr. Hussain is unaware of previous family history of genetic testing for hereditary cancer risks. There is no reported Ashkenazi Jewish ancestry. There is no known consanguinity.  GENETIC COUNSELING ASSESSMENT: Mr. Magaro is a 67 y.o. male with a personal history which is somewhat suggestive of a hereditary cancer syndrome and predisposition to cancer given his personal history of gastric cancer with signet ring features and abnormal MMR. We, therefore, discussed and recommended the following at today's visit.   DISCUSSION: We discussed that 5 - 10% of cancer is hereditary.  Hereditary gastric cancer associated with mutations in the Lynch syndrome genes.  We reviewed his abnormal MMR results (loss of PMS2).  We discussed that abnormal MMR can sometimes indicate a hereditary cancer syndrome and briefly reviewed the colon cancer and other risks and family implications for germline mutations in the Lynch syndrome genes. There are other genes that can be associated with hereditary gastric cancer syndrome, including but not limited to CDH1.  We discussed that testing is beneficial for several reasons including knowing how to follow individuals for their cancer risks and understanding if other family members could be at risk for cancer and allowing them to undergo genetic testing.   We reviewed the characteristics, features and inheritance patterns of hereditary cancer syndromes. We also discussed genetic testing, including the appropriate family members to test, the process of testing, insurance coverage and turn-around-time for results. We discussed the implications of a negative, positive, carrier and/or variant of uncertain  significant result. We recommended Mr. Newey pursue genetic testing for the Lynch syndrome genes as well as other genes associated with gastric cancer, breast cancer, and other cancers.    The [facility] Common Hereditary Cancers + RNA Panel includes sequencing, deletion/duplication, and RNA analysis of the following 48 genes: APC, ATM, AXIN2, BAP1, BARD1, BMPR1A, BRCA1, BRCA2, BRIP1, CDH1, CDK4*, CDKN2A*, CHEK2, CTNNA1, DICER1, EPCAM* (del/dup only), FH, GREM1* (promoter dup analysis only), HOXB13*, KIT*, MBD4*, MEN1, MLH1, MSH2, MSH3, MSH6, MUTYH, NF1, NTHL1, PALB2, PDGFRA*, PMS2, POLD1, POLE, PTEN, RAD51C, RAD51D, SDHA (sequencing only), SDHB, SDHC, SDHD, SMAD4, SMARCA4, STK11, TP53, TSC1, TSC2, VHL.  *Genes without RNA analysis.   Based on Mr. Biddy's personal history of gastric cancer with loss of PMS2, he meets medical criteria for genetic testing. Despite that he meets criteria, he may still have an out of pocket cost. We discussed that if his out of pocket cost for testing is over $100, the laboratory should contact him and discuss the self-pay prices and/or patient pay assistance programs.    PLAN: After considering the risks, benefits, and limitations, Mr. Barnette  provided informed consent to pursue genetic testing and the blood sample was sent to [facility] for analysis of the Common Hereditary Cancers +RNA Panel. Results should be available within approximately 3 weeks' time, at which point they will be disclosed by telephone to Mr. Surber, as will any additional recommendations warranted by these results. Mr. Mclarney will receive a summary of his genetic counseling visit and a copy of his results once available. This information will also be available in Epic.   Mr. Stoves questions were answered to his satisfaction today. Our contact information was provided should additional questions or concerns arise. Thank you for the referral and allowing [place] to share in the care of your  patient.   Garry Bochicchio M. Rennie Plowman, MS, [facility] Genetic Counselor [email or web address] (P) [phone]  The patient was seen for a total of 25 minutes in face-to-face genetic counseling.  He was accompanied by his wife, [name].  Dr. Mosetta Putt, Al Pimple, or Pamelia Hoit was available to discuss this case as needed.    _______________________________________________________________________ For Office Staff:  Number of people involved in session: 2 Was an Intern/ student involved with case: no

## 2022-11-19 NOTE — Progress Notes (Unsigned)
Virtual Visit via Video Note  I connected with Steven Wolf on 11/19/22 at 10:40 AM EDT by a video enabled telemedicine application and verified that I am speaking with the correct person using two identifiers.   I discussed the limitations of evaluation and management by telemedicine and the availability of in person appointments. The patient expressed understanding and agreed to proceed.  Present for the visit:  Myself, Dr Cheryll Cockayne, Steven Wolf.  The patient is currently at home and I am in the office.    No referring provider.    History of Present Illness: This is an acute visit for management of diabetes   Sugars elevated - started on novolog      ROS    Social History   Socioeconomic History   Marital status: Married    Spouse name: Not on file   Number of children: 0   Years of education: Not on file   Highest education level: Master's degree (e.g., MA, MS, MEng, MEd, MSW, MBA)  Occupational History   Not on file  Tobacco Use   Smoking status: Never   Smokeless tobacco: Never   Tobacco comments:    Second hand smoke  Vaping Use   Vaping status: Never Used  Substance and Sexual Activity   Alcohol use: No   Drug use: No   Sexual activity: Not on file  Other Topics Concern   Not on file  Social History Narrative   Right Handed   Lives in a two story home    Drinks some caffeine    Social Determinants of Health   Financial Resource Strain: Low Risk  (08/12/2022)   Overall Financial Resource Strain (CARDIA)    Difficulty of Paying Living Expenses: Not hard at all  Food Insecurity: No Food Insecurity (11/04/2022)   Hunger Vital Sign    Worried About Running Out of Food in the Last Year: Never true    Ran Out of Food in the Last Year: Never true  Transportation Needs: No Transportation Needs (11/04/2022)   PRAPARE - Administrator, Civil Service (Medical): No    Lack of Transportation (Non-Medical): No  Physical Activity:  Sufficiently Active (08/12/2022)   Exercise Vital Sign    Days of Exercise per Week: 6 days    Minutes of Exercise per Session: 50 min  Stress: Stress Concern Present (08/12/2022)   [employer] of Occupational Health - Occupational Stress Questionnaire    Feeling of Stress : To some extent  Social Connections: Unknown (08/12/2022)   Social Connection and Isolation Panel [NHANES]    Frequency of Communication with Friends and Family: Once a week    Frequency of Social Gatherings with Friends and Family: Patient declined    Attends Religious Services: Never    Diplomatic Services operational officer: Not on file    Attends Banker Meetings: Not on file    Marital Status: Married     Observations/Objective: Appears well in NAD   Assessment and Plan:  See Problem List for Assessment and Plan of chronic medical problems.   Follow Up Instructions:    I discussed the assessment and treatment plan with the patient. The patient was provided an opportunity to ask questions and all were answered. The patient agreed with the plan and demonstrated an understanding of the instructions.   The patient was advised to call back or seek an in-person evaluation if the symptoms worsen or if the condition fails to improve  as anticipated.    Pincus Sanes, MD

## 2022-11-20 ENCOUNTER — Encounter: Payer: Self-pay | Admitting: Internal Medicine

## 2022-11-20 ENCOUNTER — Other Ambulatory Visit (HOSPITAL_COMMUNITY): Payer: Self-pay

## 2022-11-20 ENCOUNTER — Telehealth: Payer: Medicare PPO | Admitting: Internal Medicine

## 2022-11-20 DIAGNOSIS — F419 Anxiety disorder, unspecified: Secondary | ICD-10-CM | POA: Diagnosis not present

## 2022-11-20 DIAGNOSIS — Z794 Long term (current) use of insulin: Secondary | ICD-10-CM | POA: Diagnosis not present

## 2022-11-20 DIAGNOSIS — E119 Type 2 diabetes mellitus without complications: Secondary | ICD-10-CM

## 2022-11-20 MED ORDER — ACCU-CHEK SOFTCLIX LANCETS MISC
3 refills | Status: DC
  Filled 2022-11-20: qty 100, fill #0
  Filled 2022-11-23: qty 100, 25d supply, fill #0

## 2022-11-20 MED ORDER — TECHLITE PLUS PEN NEEDLES 32G X 4 MM MISC
3 refills | Status: DC
  Filled 2022-11-20: qty 100, fill #0

## 2022-11-20 MED ORDER — INSULIN ASPART 100 UNIT/ML FLEXPEN
PEN_INJECTOR | SUBCUTANEOUS | 5 refills | Status: DC
  Filled 2022-11-20: qty 6, fill #0

## 2022-11-20 MED ORDER — FLUOXETINE HCL 20 MG PO TABS: 20.0000 mg | ORAL_TABLET | Freq: Every day | ORAL | Status: DC

## 2022-11-20 MED ORDER — ACCU-CHEK GUIDE VI STRP
1.0000 | ORAL_STRIP | Freq: Four times a day (QID) | 3 refills | Status: DC
  Filled 2022-11-20 – 2022-11-23 (×2): qty 100, 25d supply, fill #0

## 2022-11-20 NOTE — Assessment & Plan Note (Signed)
New Sugars have been elevated since being on TPN Sugars slightly elevated while on TPN, but when he is off TPN sugars are in the normal range Started on NovoLog sliding scale and this has been working well Taking minimal insulin once maybe twice a week Continue NovoLog sliding scale Prescriptions for NovoLog FlexPen and supplies sent to pharmacy

## 2022-11-20 NOTE — Assessment & Plan Note (Signed)
Chronic Has been off fluoxetine because he was not able to swallow or take anything by mouth Just restarted a couple of days ago, which I think will help 9 throughout this process Continue fluoxetine 20 mg daily

## 2022-11-21 ENCOUNTER — Other Ambulatory Visit: Payer: Medicare PPO

## 2022-11-21 ENCOUNTER — Encounter: Payer: Self-pay | Admitting: Hematology

## 2022-11-21 ENCOUNTER — Inpatient Hospital Stay: Payer: Medicare PPO | Admitting: Nutrition

## 2022-11-21 ENCOUNTER — Inpatient Hospital Stay: Payer: Medicare PPO

## 2022-11-21 ENCOUNTER — Encounter: Payer: Self-pay | Admitting: Genetic Counselor

## 2022-11-21 ENCOUNTER — Ambulatory Visit: Payer: Medicare PPO

## 2022-11-21 ENCOUNTER — Inpatient Hospital Stay (HOSPITAL_BASED_OUTPATIENT_CLINIC_OR_DEPARTMENT_OTHER): Payer: Medicare PPO | Admitting: Genetic Counselor

## 2022-11-21 ENCOUNTER — Inpatient Hospital Stay (HOSPITAL_BASED_OUTPATIENT_CLINIC_OR_DEPARTMENT_OTHER): Payer: Medicare PPO | Admitting: Hematology

## 2022-11-21 ENCOUNTER — Other Ambulatory Visit: Payer: Self-pay | Admitting: Genetic Counselor

## 2022-11-21 ENCOUNTER — Ambulatory Visit: Payer: Medicare PPO | Admitting: Nurse Practitioner

## 2022-11-21 VITALS — BP 115/97 | HR 137 | Temp 98.0°F | Resp 18

## 2022-11-21 VITALS — BP 122/92 | HR 138 | Temp 97.3°F | Resp 18 | Ht 72.0 in | Wt 177.5 lb

## 2022-11-21 DIAGNOSIS — C169 Malignant neoplasm of stomach, unspecified: Secondary | ICD-10-CM

## 2022-11-21 DIAGNOSIS — R000 Tachycardia, unspecified: Secondary | ICD-10-CM | POA: Diagnosis not present

## 2022-11-21 DIAGNOSIS — Z7189 Other specified counseling: Secondary | ICD-10-CM

## 2022-11-21 DIAGNOSIS — E43 Unspecified severe protein-calorie malnutrition: Secondary | ICD-10-CM

## 2022-11-21 DIAGNOSIS — C786 Secondary malignant neoplasm of retroperitoneum and peritoneum: Secondary | ICD-10-CM | POA: Diagnosis not present

## 2022-11-21 DIAGNOSIS — K311 Adult hypertrophic pyloric stenosis: Secondary | ICD-10-CM | POA: Diagnosis not present

## 2022-11-21 DIAGNOSIS — Z5112 Encounter for antineoplastic immunotherapy: Secondary | ICD-10-CM | POA: Diagnosis not present

## 2022-11-21 DIAGNOSIS — Z803 Family history of malignant neoplasm of breast: Secondary | ICD-10-CM

## 2022-11-21 DIAGNOSIS — Z95828 Presence of other vascular implants and grafts: Secondary | ICD-10-CM

## 2022-11-21 DIAGNOSIS — Z79899 Other long term (current) drug therapy: Secondary | ICD-10-CM | POA: Diagnosis not present

## 2022-11-21 DIAGNOSIS — E860 Dehydration: Secondary | ICD-10-CM | POA: Diagnosis not present

## 2022-11-21 DIAGNOSIS — D72829 Elevated white blood cell count, unspecified: Secondary | ICD-10-CM | POA: Diagnosis not present

## 2022-11-21 LAB — CBC WITH DIFFERENTIAL (CANCER CENTER ONLY)
Abs Immature Granulocytes: 0.53 10*3/uL — ABNORMAL HIGH (ref 0.00–0.07)
Basophils Absolute: 0.1 10*3/uL (ref 0.0–0.1)
Basophils Relative: 1 %
Eosinophils Absolute: 0 10*3/uL (ref 0.0–0.5)
Eosinophils Relative: 0 %
HCT: 34.5 % — ABNORMAL LOW (ref 39.0–52.0)
Hemoglobin: 11.8 g/dL — ABNORMAL LOW (ref 13.0–17.0)
Immature Granulocytes: 2 %
Lymphocytes Relative: 5 %
Lymphs Abs: 1.2 10*3/uL (ref 0.7–4.0)
MCH: 30.2 pg (ref 26.0–34.0)
MCHC: 34.2 g/dL (ref 30.0–36.0)
MCV: 88.2 fL (ref 80.0–100.0)
Monocytes Absolute: 1.9 10*3/uL — ABNORMAL HIGH (ref 0.1–1.0)
Monocytes Relative: 8 %
Neutro Abs: 20.5 10*3/uL — ABNORMAL HIGH (ref 1.7–7.7)
Neutrophils Relative %: 84 %
Platelet Count: 359 10*3/uL (ref 150–400)
RBC: 3.91 MIL/uL — ABNORMAL LOW (ref 4.22–5.81)
RDW: 13.8 % (ref 11.5–15.5)
WBC Count: 24.3 10*3/uL — ABNORMAL HIGH (ref 4.0–10.5)
nRBC: 0 % (ref 0.0–0.2)

## 2022-11-21 LAB — CMP (CANCER CENTER ONLY)
ALT: 9 U/L (ref 0–44)
AST: 16 U/L (ref 15–41)
Albumin: 2.7 g/dL — ABNORMAL LOW (ref 3.5–5.0)
Alkaline Phosphatase: 100 U/L (ref 38–126)
Anion gap: 5 (ref 5–15)
BUN: 37 mg/dL — ABNORMAL HIGH (ref 8–23)
CO2: 28 mmol/L (ref 22–32)
Calcium: 8.8 mg/dL — ABNORMAL LOW (ref 8.9–10.3)
Chloride: 103 mmol/L (ref 98–111)
Creatinine: 0.68 mg/dL (ref 0.61–1.24)
GFR, Estimated: >60 mL/min (ref >60)
Glucose, Bld: 136 mg/dL — ABNORMAL HIGH (ref 70–99)
Potassium: 4.5 mmol/L (ref 3.5–5.1)
Sodium: 136 mmol/L (ref 135–145)
Total Bilirubin: 1.2 mg/dL (ref 0.3–1.2)
Total Protein: 6.2 g/dL — ABNORMAL LOW (ref 6.5–8.1)

## 2022-11-21 LAB — GENETIC SCREENING ORDER

## 2022-11-21 MED ORDER — SODIUM CHLORIDE 0.9 % IV SOLN: Freq: Once | INTRAVENOUS | Status: AC

## 2022-11-21 MED ORDER — SODIUM CHLORIDE 0.9% FLUSH
10.0000 mL | INTRAVENOUS | Status: DC | PRN
  Administered 2022-11-21: 10 mL

## 2022-11-21 MED ORDER — SODIUM CHLORIDE 0.9% FLUSH
10.0000 mL | Freq: Once | INTRAVENOUS | Status: AC
  Administered 2022-11-21: 10 mL

## 2022-11-21 MED ORDER — SODIUM CHLORIDE 0.9 % IV SOLN
240.0000 mg | Freq: Once | INTRAVENOUS | Status: AC
  Administered 2022-11-21: 240 mg via INTRAVENOUS
  Filled 2022-11-21: qty 24

## 2022-11-21 NOTE — Assessment & Plan Note (Signed)
-  Adenocarcinoma with signet ring features, stage IV with diffuse peritoneal metastasis, G3, loss of PMS2 -Diagnosed in July 2024, he presented with dysphagia and 25 pound weight loss. 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 He started on 11/07/2022

## 2022-11-21 NOTE — Patient Instructions (Signed)
[facility]  Discharge Instructions: Thank you for choosing [facility] to provide your oncology and hematology care.   If you have a lab appointment with the Cancer Center, please go directly to the Cancer Center and check in at the registration area.   Wear comfortable clothing and clothing appropriate for easy access to any Portacath or PICC line.   We strive to give you quality time with your provider. You may need to reschedule your appointment if you arrive late (15 or more minutes).  Arriving late affects you and other patients whose appointments are after yours.  Also, if you miss three or more appointments without notifying the office, you may be dismissed from the clinic at the provider's discretion.      For prescription refill requests, have your pharmacy contact our office and allow 72 hours for refills to be completed.    Today you received the following chemotherapy and/or immunotherapy agents: nivolumab      To help prevent nausea and vomiting after your treatment, we encourage you to take your nausea medication as directed.  BELOW ARE SYMPTOMS THAT SHOULD BE REPORTED IMMEDIATELY: *FEVER GREATER THAN 100.4 F (38 C) OR HIGHER *CHILLS OR SWEATING *NAUSEA AND VOMITING THAT IS NOT CONTROLLED WITH YOUR NAUSEA MEDICATION *UNUSUAL SHORTNESS OF BREATH *UNUSUAL BRUISING OR BLEEDING *URINARY PROBLEMS (pain or burning when urinating, or frequent urination) *BOWEL PROBLEMS (unusual diarrhea, constipation, pain near the anus) TENDERNESS IN MOUTH AND THROAT WITH OR WITHOUT PRESENCE OF ULCERS (sore throat, sores in mouth, or a toothache) UNUSUAL RASH, SWELLING OR PAIN  UNUSUAL VAGINAL DISCHARGE OR ITCHING   Items with * indicate a potential emergency and should be followed up as soon as possible or go to the Emergency Department if any problems should occur.  Please show the CHEMOTHERAPY ALERT CARD or IMMUNOTHERAPY ALERT CARD at  check-in to the Emergency Department and triage nurse.  Should you have questions after your visit or need to cancel or reschedule your appointment, please contact [facility]  Dept: [phone]  and follow the prompts.  Office hours are 8:00 a.m. to 4:30 p.m. Monday - Friday. Please note that voicemails left after 4:00 p.m. may not be returned until the following business day.  We are closed weekends and major holidays. You have access to a nurse at all times for urgent questions. Please call the main number to the clinic Dept: [phone] and follow the prompts.   For any non-urgent questions, you may also contact your provider using MyChart. We now offer e-Visits for anyone 18 and older to request care online for non-urgent symptoms. For details visit [email or web address].   Also download the MyChart app! Go to the app store, search "MyChart", open the app, select [facility], and log in with your MyChart username and password.   

## 2022-11-21 NOTE — Progress Notes (Signed)
[facility]   Telephone:(336) [phone] Fax:(336) [phone]   Clinic Follow up Note   Patient Care Team: Pincus Sanes, MD as PCP - General (Internal Medicine) Glendale Chard, DO as Consulting Physician (Neurology) [name], MD as Consulting Physician (Hematology and Oncology) Glee Arvin, NP as Nurse Practitioner [facility] and Palliative Medicine)  Date of Service:  11/21/2022  CHIEF COMPLAINT: f/u of Gastric cancer   CURRENT THERAPY:  Nivolumab (3) D1,15,29 +Ipilimumab (1) D1 q42d    ASSESSMENT:  Steven Wolf is a 67 y.o. 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 -He open to DNR and hospice when it's time for him   J-tube leakage  -started about a few days ago -No clinical concern for bowel  obstruction -He has not been using the G-tube since it is placed in the hospital, due to the poor tolerance from ileus/partial bowel obstruction from peritoneal metastasis   PLAN: -lab reviewed,  -PET  scan schedule 9/5 -He will follow-up with Dr. Donell Beers for J-tube leakage tomorrow   SUMMARY OF ONCOLOGIC HISTORY: Oncology History  Adenocarcinoma of stomach (HCC)  10/01/2022 Initial Diagnosis   Adenocarcinoma of stomach (HCC)   10/01/2022 Pathology Results   Diagnosis 1. Stomach, biopsy, gastric mass, cold biopsy - POORLY DIFFERENTIATED ADENOCARCINOMA WITH SIGNET RING CELL FEATURES.  2. Stomach- Cardia, cold biopsy - OXYNTIC MUCOSA WITH LAMINA PROPRIA EDEMA. - NEGATIVE FOR MALIGNANCY ON THE SECTIONS EXAMINED   10/11/2022 Imaging    IMPRESSION: 1. Gastric wall thickening, consistent with known gastric cancer. Perigastric fat stranding, concerning for local invasion. 2. New prominent subcentimeter gastrohepatic ligament and left para-aortic lymph nodes, concerning for nodal metastatic disease. 3. Mild aortic Atherosclerosis (ICD10-I70.0).     11/07/2022 -  Chemotherapy   Patient is on Treatment Plan : LUNG NSCLC Nivolumab (3) D1,15,29 + Ipilimumab (1) D1 q42d        INTERVAL HISTORY:  Steven Wolf is here for a follow up of Gastric cancer. He was last seen by me one week ago. He noticed yellownish liquid coming out at the J-tube site, he has to use a towel to absorb the leakage this morning Still has intermittent nausea, but less vomiting and spiting   He has very small BM daily  No fever or chills  He gets IVF 2-3 times at home alone with TPN     All other systems were reviewed with the patient and are negative.  MEDICAL HISTORY:  Past Medical History:  Diagnosis Date   Anxiety    Benign prostatic hypertrophy    Diverticulosis of colon    GERD (gastroesophageal reflux disease)    Sullivan Lone syndrome    History of kidney stones    Hypertension    Microscopic  hematuria    Motion sickness    ocean boats, curvy roads   Nephrolithiasis     X 2; Dr Aldean Ast   Sleep apnea    USES CPAP   Syncope 02/2007   NOCTURNAL POST MICTURTION   Urticaria     SURGICAL HISTORY: Past Surgical History:  Procedure Laterality Date   APPENDECTOMY     BIOPSY  10/16/2022   Procedure: BIOPSY;  Surgeon: Lemar Lofty., MD;  Location: [facility];  Service: Gastroenterology;;   COLONOSCOPY  2010   NORMAL(pt states 2 other colonoscopies in the past)   COLONOSCOPY W/ POLYPECTOMY  2002   Tics 2005 & 2010; due 2020, Dr Jarold Motto   ESOPHAGOGASTRODUODENOSCOPY (EGD) WITH PROPOFOL N/A 09/30/2022   Procedure: ESOPHAGOGASTRODUODENOSCOPY (EGD) WITH BIOPSY;  Surgeon: Midge Minium, MD;  Location: [facility];  Service: Endoscopy;  Laterality: N/A;   ESOPHAGOGASTRODUODENOSCOPY (EGD) WITH PROPOFOL N/A 10/16/2022   Procedure: ESOPHAGOGASTRODUODENOSCOPY (EGD) WITH PROPOFOL;  Surgeon: Meridee Score Netty Starring., MD;  Location: [facility];  Service: Gastroenterology;  Laterality: N/A;   EUS N/A 10/16/2022   Procedure: UPPER ENDOSCOPIC ULTRASOUND (EUS) RADIAL;  Surgeon: Lemar Lofty., MD;  Location: [facility];  Service: Gastroenterology;  Laterality: N/A;   LAPAROSCOPY N/A 10/16/2022   Procedure: DIAGNOSTIC LAPAROSCOPY, OPEN J TUBE PLACEMENT;  Surgeon: Almond Lint, MD;  Location: [facility];  Service: General;  Laterality: N/A;   LIVER BIOPSY     X 1 for elevated LFTs with NEGATIVE  hepatitis serologies   PORTACATH PLACEMENT N/A 10/16/2022   Procedure: INSERTION PORT-A-CATH;  Surgeon: Almond Lint, MD;  Location: [facility];  Service: General;  Laterality: N/A;   TONSILLECTOMY     TONSILLECTOMY     PT STATES HAS HAD IT TWICE   UPPER GASTROINTESTINAL ENDOSCOPY  1995    I have reviewed the social history and family history with the patient and they are unchanged from previous note.  ALLERGIES:  is allergic to ramipril and penicillins.  MEDICATIONS:  Current  Outpatient Medications  Medication Sig Dispense Refill   Accu-Chek Softclix Lancets lancets Use to test blood sugar 4 times a day 100 each 3   blood glucose meter kit and supplies KIT Dispense based on patient and insurance preference. Use up to four times daily as directed. 1 each 0   Blood Glucose Monitoring Suppl (ACCU-CHEK GUIDE) w/Device KIT Use to test blood sugar 4 times a day 1 kit 0   FLUoxetine (PROZAC) 20 MG tablet Take 1 tablet (20 mg total) by mouth daily.     glucose blood (ACCU-CHEK GUIDE) test strip Use to test blood sugar 4 times a day 100 each 3   insulin aspart (NOVOLOG) 100 UNIT/ML FlexPen Inject  into the skin according to sliding scale: If CBG 121-150 inject 2 units,151-200 3 units,201-250 5 units,251-300 8 units,301-350 11 units,351-400 15 units,over 400 call MD 6 mL 5   Insulin Pen Needle (TECHLITE PLUS PEN NEEDLES) 32G X 4 MM MISC Use to inject insulin as directed 100 each 3   Omeprazole-Sodium Bicarbonate 2-84 MG/ML SUSR Take 20 mLs by mouth daily. 300 mL 1   ondansetron (ZOFRAN-ODT) 4 MG disintegrating tablet Dissolve 1-2 tablets (4-8 mg total) by mouth every 8 (eight)  hours as needed for nausea or vomiting. 30 tablet 2   pantoprazole (PROTONIX) 40 MG injection Inject 40 mg into the vein daily. (Patient not taking: Reported on 11/04/2022) 30 each 0   PRESCRIPTION MEDICATION CPAP- At bedtime as determined by the patient (Patient not taking: Reported on 11/04/2022)     No current facility-administered medications for this visit.    PHYSICAL EXAMINATION: ECOG PERFORMANCE STATUS: 3 - Symptomatic, >50% confined to bed  Vitals:   11/21/22 1217  BP: (!) 122/92  Pulse: (!) 138  Resp: 18  Temp: (!) 97.3 F (36.3 C)  SpO2: 98%   Wt Readings from Last 3 Encounters:  11/21/22 177 lb 8 oz (80.5 kg)  11/18/22 168 lb 14.4 oz (76.6 kg)  11/13/22 159 lb 6.4 oz (72.3 kg)     GENERAL:alert, no distress and comfortable SKIN: skin color normal, no rashes or significant  lesions EYES: normal, Conjunctiva are pink and non-injected, sclera clear  Abdomen: Slightly distended, incision has healed well.  J-tube site has yellowish secretion, bowel sound is sluggish. NEURO: alert & oriented x 3 with fluent speech  LABORATORY DATA:  I have reviewed the data as listed    Latest Ref Rng & Units 11/21/2022   11:59 AM 11/18/2022   12:03 PM 11/13/2022    8:29 AM  CBC  WBC 4.0 - 10.5 K/uL 24.3  20.5  18.8   Hemoglobin 13.0 - 17.0 g/dL 69.6  29.5  28.4   Hematocrit 39.0 - 52.0 % 34.5  36.2  38.2   Platelets 150 - 400 K/uL 359  321  283         Latest Ref Rng & Units 11/18/2022   12:03 PM 11/13/2022    8:29 AM 11/07/2022    7:37 AM  CMP  Glucose 70 - 99 mg/dL 132  440  88   BUN 8 - 23 mg/dL 34  32  29   Creatinine 0.61 - 1.24 mg/dL 1.02  7.25  3.66   Sodium 135 - 145 mmol/L 135  135  136   Potassium 3.5 - 5.1 mmol/L 4.6  4.5  4.5   Chloride 98 - 111 mmol/L 101  101  100   CO2 22 - 32 mmol/L 27  30  29    Calcium 8.9 - 10.3 mg/dL 8.6  8.6  8.3   Total Protein 6.5 - 8.1 g/dL 6.0  6.2  6.2   Total Bilirubin 0.3 - 1.2 mg/dL 1.0  1.2  1.1   Alkaline Phos 38 - 126 U/L 101  107  162   AST 15 - 41 U/L 15  13  17    ALT 0 - 44 U/L 9  12  22        RADIOGRAPHIC STUDIES: I have personally reviewed the radiological images as listed and agreed with the findings in the report. No results found.    No orders of the defined types were placed in this encounter.  All questions were answered. The patient knows to call the clinic with any problems, questions or concerns. No barriers to learning was detected. The total time spent in the appointment was 25 minutes.     [name], MD 11/21/2022

## 2022-11-21 NOTE — Assessment & Plan Note (Signed)
-  Secondary to underlying malignancy -Patient has J feeding tube, but is not able to tolerate likely due to the bowel obstruction -continue TPN

## 2022-11-21 NOTE — Progress Notes (Signed)
Brief nutrition follow-up completed with patient and wife.  Patient is followed by Dr. Mosetta Putt for gastric cancer.  Current weight documented as 177 pounds 8 ounces.   This is increased from 168 pounds 14.4 ounces August 26.   Patient weighed 159 pounds 6 ounces August 21.  Labs noted: Glucose 136, BUN 37, albumin 2.7.  Estimated nutrition needs: 2400-2600 cal, 110-130 g protein, greater than 2.5 L fluid.  Patient and wife concerned regarding ongoing leaking J-tube.  They have an appointment with his surgeon tomorrow to address this.  He continues to take sips of water by mouth but does not use his J-tube for feeding.  He is on TPN.  Noted weight fluctuations over the past week.  Abdomen is slightly distended.  Question accuracy of weights versus fluid shifts.  Reports a small bowel movement yesterday.  He has intermittent nausea.  Reports less vomiting.  Nutrition diagnosis: Unintended weight loss, improving, however questions accuracy due to wt fluctuations. Severe malnutrition, ongoing.  Intervention: Continue cleaning J-tube site as directed. Follow-up with surgeon regarding leaking J-tube. Diet per surgery.  Await decision to initiate tube feedings through jejunostomy tube.  TPN to continue to provide him 100% estimated nutrition needs. Monitor weight fluctuations.  Monitoring, evaluation, goals: Patient will tolerate adequate calories and protein to minimize weight loss and promote healing.  Transition to oral diet/tube feeding per surgery.  Next visit scheduled: Thursday, September 12 during infusion.  **Disclaimer: This note was dictated with voice recognition software. Similar sounding words can inadvertently be transcribed and this note may contain transcription errors which may not have been corrected upon publication of note.**

## 2022-11-21 NOTE — Progress Notes (Signed)
Per Dr Mosetta Putt, ok to treat today with HR 139.

## 2022-11-21 NOTE — Assessment & Plan Note (Signed)
-  Secondary to gastric cancer -Clear liquid by mouth only

## 2022-11-21 NOTE — Assessment & Plan Note (Signed)
-  We again discussed the incurable nature of his/her cancer, and the overall poor prognosis, especially if he does not have good response to chemotherapy or progress on chemo -The patient understands the goal of care is palliative. -He open to DNR and hospice when it's time for him

## 2022-11-22 ENCOUNTER — Encounter: Payer: Self-pay | Admitting: Hematology

## 2022-11-22 DIAGNOSIS — Z434 Encounter for attention to other artificial openings of digestive tract: Secondary | ICD-10-CM | POA: Diagnosis not present

## 2022-11-22 DIAGNOSIS — G893 Neoplasm related pain (acute) (chronic): Secondary | ICD-10-CM | POA: Diagnosis not present

## 2022-11-22 DIAGNOSIS — Z452 Encounter for adjustment and management of vascular access device: Secondary | ICD-10-CM | POA: Diagnosis not present

## 2022-11-22 DIAGNOSIS — K632 Fistula of intestine: Secondary | ICD-10-CM | POA: Diagnosis not present

## 2022-11-22 DIAGNOSIS — C169 Malignant neoplasm of stomach, unspecified: Secondary | ICD-10-CM | POA: Diagnosis not present

## 2022-11-22 DIAGNOSIS — K311 Adult hypertrophic pyloric stenosis: Secondary | ICD-10-CM | POA: Diagnosis not present

## 2022-11-22 DIAGNOSIS — I10 Essential (primary) hypertension: Secondary | ICD-10-CM | POA: Diagnosis not present

## 2022-11-22 DIAGNOSIS — E43 Unspecified severe protein-calorie malnutrition: Secondary | ICD-10-CM | POA: Diagnosis not present

## 2022-11-23 ENCOUNTER — Other Ambulatory Visit (HOSPITAL_COMMUNITY): Payer: Self-pay

## 2022-11-26 ENCOUNTER — Ambulatory Visit: Payer: Medicare PPO

## 2022-11-26 ENCOUNTER — Other Ambulatory Visit (HOSPITAL_COMMUNITY): Payer: Self-pay

## 2022-11-26 ENCOUNTER — Other Ambulatory Visit: Payer: Self-pay

## 2022-11-26 ENCOUNTER — Ambulatory Visit: Payer: Medicare PPO | Admitting: Hematology

## 2022-11-26 ENCOUNTER — Other Ambulatory Visit: Payer: Medicare PPO

## 2022-11-26 DIAGNOSIS — I10 Essential (primary) hypertension: Secondary | ICD-10-CM | POA: Diagnosis not present

## 2022-11-26 DIAGNOSIS — Z434 Encounter for attention to other artificial openings of digestive tract: Secondary | ICD-10-CM | POA: Diagnosis not present

## 2022-11-26 DIAGNOSIS — K311 Adult hypertrophic pyloric stenosis: Secondary | ICD-10-CM | POA: Diagnosis not present

## 2022-11-26 DIAGNOSIS — Z452 Encounter for adjustment and management of vascular access device: Secondary | ICD-10-CM | POA: Diagnosis not present

## 2022-11-26 DIAGNOSIS — E43 Unspecified severe protein-calorie malnutrition: Secondary | ICD-10-CM | POA: Diagnosis not present

## 2022-11-26 DIAGNOSIS — C169 Malignant neoplasm of stomach, unspecified: Secondary | ICD-10-CM | POA: Diagnosis not present

## 2022-11-26 DIAGNOSIS — K632 Fistula of intestine: Secondary | ICD-10-CM | POA: Diagnosis not present

## 2022-11-26 DIAGNOSIS — G893 Neoplasm related pain (acute) (chronic): Secondary | ICD-10-CM | POA: Diagnosis not present

## 2022-11-26 MED ORDER — FLUCONAZOLE 10 MG/ML PO SUSR
100.0000 mg | Freq: Every day | ORAL | 0 refills | Status: DC
  Filled 2022-11-26: qty 70, 7d supply, fill #0

## 2022-11-27 ENCOUNTER — Ambulatory Visit: Payer: Medicare PPO

## 2022-11-27 ENCOUNTER — Other Ambulatory Visit: Payer: Medicare PPO

## 2022-11-27 ENCOUNTER — Other Ambulatory Visit (HOSPITAL_COMMUNITY): Payer: Self-pay

## 2022-11-27 ENCOUNTER — Ambulatory Visit: Payer: Medicare PPO | Admitting: Hematology

## 2022-11-27 NOTE — Progress Notes (Deleted)
Palliative Medicine [facility]  Telephone:(336) [phone] Fax:(336) [phone]   Name: Steven Wolf Date: 11/27/2022 MRN: [id number]  DOB: 04-Jan-1956  Patient Care Team: Pincus Sanes, MD as PCP - General (Internal Medicine) Glendale Chard, DO as Consulting Physician (Neurology) [name], MD as Consulting Physician (Hematology and Oncology) Pickenpack-Cousar, Arty Baumgartner, NP as Nurse Practitioner [facility] and Palliative Medicine)    INTERVAL HISTORY: Steven Wolf is a 67 y.o. 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  Palliative ask to see for symptom management and goals of care.   SOCIAL HISTORY:     reports that he has never smoked. He has never used smokeless tobacco. He reports that he does not drink alcohol and does not use drugs.  ADVANCE DIRECTIVES:  Advanced directives on file naming Andhy Stitzel and Nisaiah Rybinski as patient's first and second medical decision makers respectively should the patient become unable to advocate for himself.   CODE STATUS: Full code  PAST MEDICAL HISTORY: Past Medical History:  Diagnosis Date   Anxiety    Benign prostatic hypertrophy    Diverticulosis of colon    GERD (gastroesophageal reflux disease)    Sullivan Lone syndrome    History of kidney stones    Hypertension    Microscopic hematuria    Motion sickness    ocean boats, curvy roads   Nephrolithiasis     X 2; Dr Aldean Ast   Sleep apnea    USES CPAP   Syncope 02/2007   NOCTURNAL POST MICTURTION   Urticaria     ALLERGIES:  is allergic to ramipril and penicillins.  MEDICATIONS:  Current Outpatient Medications  Medication Sig Dispense Refill   Accu-Chek Softclix Lancets lancets Use to test blood sugar 4 times a day 100 each 3   blood glucose meter kit and supplies KIT  Dispense based on patient and insurance preference. Use up to four times daily as directed. 1 each 0   Blood Glucose Monitoring Suppl (ACCU-CHEK GUIDE) w/Device KIT Use to test blood sugar 4 times a day 1 kit 0   fluconazole (DIFLUCAN) 10 MG/ML suspension Take 10 mLs (100 mg total) by mouth daily. 70 mL 0   FLUoxetine (PROZAC) 20 MG tablet Take 1 tablet (20 mg total) by mouth daily.     glucose blood (ACCU-CHEK GUIDE) test strip Use to test blood sugar four times daily as directed 100 each 3   insulin aspart (NOVOLOG) 100 UNIT/ML FlexPen Inject  into the skin according to sliding scale: If CBG 121-150 inject 2 units,151-200 3 units,201-250 5 units,251-300 8 units,301-350 11 units,351-400 15 units,over 400 call MD 6 mL 5   Insulin Pen Needle (TECHLITE PLUS PEN NEEDLES) 32G X 4 MM MISC Use to inject insulin as directed 100 each 3   Omeprazole-Sodium Bicarbonate 2-84 MG/ML SUSR Take 20 mLs by mouth daily. 300 mL 1   ondansetron (ZOFRAN-ODT) 4 MG disintegrating tablet Dissolve 1-2 tablets (4-8 mg total) by mouth every 8 (eight) hours as needed for nausea or vomiting. 30 tablet 2   pantoprazole (PROTONIX) 40 MG injection Inject 40 mg into the vein daily. (Patient not taking: Reported on 11/04/2022) 30 each 0   PRESCRIPTION MEDICATION CPAP- At bedtime as determined by the patient (Patient not taking: Reported on 11/04/2022)     No current facility-administered medications for this visit.  VITAL SIGNS: There were no vitals taken for this visit. There were no vitals filed for this visit.  Estimated body mass index is 24.07 kg/m as calculated from the following:   Height as of 11/21/22: 6' (1.829 m).   Weight as of 11/21/22: 177 lb 8 oz (80.5 kg).   PERFORMANCE STATUS (ECOG) : 3 - Symptomatic, >50% confined to bed   Physical Exam General: NAD Cardiovascular: regular rate and rhythm Pulmonary: clear ant fields Abdomen: soft, nontender, + bowel sounds Extremities: no edema, no joint  deformities Skin: no rashes Neurological:   IMPRESSION: ***  We discussed Her current illness and what it means in the larger context of Her on-going co-morbidities. Natural disease trajectory and expectations were discussed.  I discussed the importance of continued conversation with family and their medical providers regarding overall plan of care and treatment options, ensuring decisions are within the context of the patients values and GOCs.  PLAN: Established therapeutic relationship. Education provided on palliative's role in collaboration with their Oncology/Radiation team. Zofran-ODT for nausea Restart Prozac orally. Wife knows if unable to tolerate to contact office.  Restart Valsartan Dulcolax suppository for bowel regimen  May open omeprazole capsule and take orally versus liquid due to cost.  Ongoing goals of care support and symptom management 1L IVF in office today.   I will plan to see patient back in 1-2 weeks in collaboration to other oncology appointments.    Patient expressed understanding and was in agreement with this plan. He also understands that He can call the clinic at any time with any questions, concerns, or complaints.   Any controlled substances utilized were prescribed in the context of palliative care. PDMP has been reviewed.    Visit consisted of counseling and education dealing with the complex and emotionally intense issues of symptom management and palliative care in the setting of serious and potentially life-threatening illness.Greater than 50%  of this time was spent counseling and coordinating care related to the above assessment and plan.  Willette Alma, AGPCNP-BC  Palliative Medicine Team/[facility]  *Please note that this is a verbal dictation therefore any spelling or grammatical errors are due to the "[employer]agon [employer] One" system interpretation.

## 2022-11-28 ENCOUNTER — Encounter: Payer: Self-pay | Admitting: Hematology

## 2022-11-28 ENCOUNTER — Ambulatory Visit (HOSPITAL_COMMUNITY)
Admission: RE | Admit: 2022-11-28 | Discharge: 2022-11-28 | Disposition: A | Discharge status: Home / Self Care | Payer: Medicare PPO | Source: Ambulatory Visit | Attending: Hematology | Admitting: Hematology

## 2022-11-28 ENCOUNTER — Ambulatory Visit: Payer: Medicare PPO

## 2022-11-28 ENCOUNTER — Ambulatory Visit: Payer: Medicare PPO | Admitting: Nurse Practitioner

## 2022-11-28 ENCOUNTER — Other Ambulatory Visit: Payer: Self-pay

## 2022-11-28 ENCOUNTER — Inpatient Hospital Stay: Payer: Medicare PPO | Attending: Hematology

## 2022-11-28 ENCOUNTER — Other Ambulatory Visit: Payer: Medicare PPO

## 2022-11-28 DIAGNOSIS — R0600 Dyspnea, unspecified: Secondary | ICD-10-CM | POA: Diagnosis not present

## 2022-11-28 DIAGNOSIS — Z48813 Encounter for surgical aftercare following surgery on the respiratory system: Secondary | ICD-10-CM | POA: Diagnosis not present

## 2022-11-28 DIAGNOSIS — C482 Malignant neoplasm of peritoneum, unspecified: Secondary | ICD-10-CM | POA: Diagnosis not present

## 2022-11-28 DIAGNOSIS — Z1152 Encounter for screening for COVID-19: Secondary | ICD-10-CM | POA: Diagnosis not present

## 2022-11-28 DIAGNOSIS — K9423 Gastrostomy malfunction: Secondary | ICD-10-CM | POA: Diagnosis present

## 2022-11-28 DIAGNOSIS — Z7189 Other specified counseling: Secondary | ICD-10-CM | POA: Diagnosis not present

## 2022-11-28 DIAGNOSIS — R531 Weakness: Secondary | ICD-10-CM | POA: Diagnosis present

## 2022-11-28 DIAGNOSIS — E876 Hypokalemia: Secondary | ICD-10-CM | POA: Diagnosis present

## 2022-11-28 DIAGNOSIS — D72829 Elevated white blood cell count, unspecified: Secondary | ICD-10-CM | POA: Diagnosis present

## 2022-11-28 DIAGNOSIS — C772 Secondary and unspecified malignant neoplasm of intra-abdominal lymph nodes: Secondary | ICD-10-CM | POA: Diagnosis present

## 2022-11-28 DIAGNOSIS — Z794 Long term (current) use of insulin: Secondary | ICD-10-CM | POA: Diagnosis not present

## 2022-11-28 DIAGNOSIS — R188 Other ascites: Secondary | ICD-10-CM | POA: Diagnosis not present

## 2022-11-28 DIAGNOSIS — Y838 Other surgical procedures as the cause of abnormal reaction of the patient, or of later complication, without mention of misadventure at the time of the procedure: Secondary | ICD-10-CM | POA: Diagnosis present

## 2022-11-28 DIAGNOSIS — C169 Malignant neoplasm of stomach, unspecified: Secondary | ICD-10-CM | POA: Insufficient documentation

## 2022-11-28 DIAGNOSIS — I10 Essential (primary) hypertension: Secondary | ICD-10-CM | POA: Diagnosis present

## 2022-11-28 DIAGNOSIS — Z79899 Other long term (current) drug therapy: Secondary | ICD-10-CM | POA: Diagnosis not present

## 2022-11-28 DIAGNOSIS — C168 Malignant neoplasm of overlapping sites of stomach: Secondary | ICD-10-CM | POA: Diagnosis not present

## 2022-11-28 DIAGNOSIS — E43 Unspecified severe protein-calorie malnutrition: Secondary | ICD-10-CM | POA: Diagnosis present

## 2022-11-28 DIAGNOSIS — K632 Fistula of intestine: Secondary | ICD-10-CM | POA: Diagnosis not present

## 2022-11-28 DIAGNOSIS — R0602 Shortness of breath: Secondary | ICD-10-CM | POA: Diagnosis not present

## 2022-11-28 DIAGNOSIS — R846 Abnormal cytological findings in specimens from respiratory organs and thorax: Secondary | ICD-10-CM | POA: Diagnosis not present

## 2022-11-28 DIAGNOSIS — E785 Hyperlipidemia, unspecified: Secondary | ICD-10-CM | POA: Diagnosis present

## 2022-11-28 DIAGNOSIS — E8770 Fluid overload, unspecified: Secondary | ICD-10-CM | POA: Diagnosis present

## 2022-11-28 DIAGNOSIS — R918 Other nonspecific abnormal finding of lung field: Secondary | ICD-10-CM | POA: Diagnosis not present

## 2022-11-28 DIAGNOSIS — Z515 Encounter for palliative care: Secondary | ICD-10-CM | POA: Diagnosis not present

## 2022-11-28 DIAGNOSIS — C786 Secondary malignant neoplasm of retroperitoneum and peritoneum: Secondary | ICD-10-CM | POA: Diagnosis present

## 2022-11-28 DIAGNOSIS — J9811 Atelectasis: Secondary | ICD-10-CM | POA: Diagnosis not present

## 2022-11-28 DIAGNOSIS — R000 Tachycardia, unspecified: Secondary | ICD-10-CM | POA: Diagnosis not present

## 2022-11-28 DIAGNOSIS — C771 Secondary and unspecified malignant neoplasm of intrathoracic lymph nodes: Secondary | ICD-10-CM | POA: Diagnosis not present

## 2022-11-28 DIAGNOSIS — J90 Pleural effusion, not elsewhere classified: Secondary | ICD-10-CM | POA: Diagnosis present

## 2022-11-28 DIAGNOSIS — E119 Type 2 diabetes mellitus without complications: Secondary | ICD-10-CM | POA: Diagnosis present

## 2022-11-28 DIAGNOSIS — R180 Malignant ascites: Secondary | ICD-10-CM | POA: Diagnosis present

## 2022-11-28 DIAGNOSIS — R0989 Other specified symptoms and signs involving the circulatory and respiratory systems: Secondary | ICD-10-CM | POA: Diagnosis not present

## 2022-11-28 DIAGNOSIS — J984 Other disorders of lung: Secondary | ICD-10-CM | POA: Diagnosis not present

## 2022-11-28 DIAGNOSIS — R0609 Other forms of dyspnea: Secondary | ICD-10-CM | POA: Diagnosis not present

## 2022-11-28 DIAGNOSIS — M7989 Other specified soft tissue disorders: Secondary | ICD-10-CM | POA: Diagnosis not present

## 2022-11-28 DIAGNOSIS — B370 Candidal stomatitis: Secondary | ICD-10-CM | POA: Diagnosis present

## 2022-11-28 DIAGNOSIS — E8809 Other disorders of plasma-protein metabolism, not elsewhere classified: Secondary | ICD-10-CM | POA: Diagnosis present

## 2022-11-28 DIAGNOSIS — Z66 Do not resuscitate: Secondary | ICD-10-CM | POA: Diagnosis present

## 2022-11-28 DIAGNOSIS — D630 Anemia in neoplastic disease: Secondary | ICD-10-CM | POA: Diagnosis present

## 2022-11-28 DIAGNOSIS — Z452 Encounter for adjustment and management of vascular access device: Secondary | ICD-10-CM | POA: Diagnosis not present

## 2022-11-28 DIAGNOSIS — A419 Sepsis, unspecified organism: Secondary | ICD-10-CM | POA: Diagnosis not present

## 2022-11-28 DIAGNOSIS — C799 Secondary malignant neoplasm of unspecified site: Secondary | ICD-10-CM | POA: Diagnosis not present

## 2022-11-28 DIAGNOSIS — Z434 Encounter for attention to other artificial openings of digestive tract: Secondary | ICD-10-CM | POA: Diagnosis not present

## 2022-11-28 DIAGNOSIS — G893 Neoplasm related pain (acute) (chronic): Secondary | ICD-10-CM | POA: Diagnosis not present

## 2022-11-28 DIAGNOSIS — N179 Acute kidney failure, unspecified: Secondary | ICD-10-CM | POA: Diagnosis not present

## 2022-11-28 DIAGNOSIS — K311 Adult hypertrophic pyloric stenosis: Secondary | ICD-10-CM | POA: Diagnosis present

## 2022-11-28 DIAGNOSIS — Z95828 Presence of other vascular implants and grafts: Secondary | ICD-10-CM

## 2022-11-28 LAB — CBC WITH DIFFERENTIAL (CANCER CENTER ONLY)
Abs Immature Granulocytes: 0.49 10*3/uL — ABNORMAL HIGH (ref 0.00–0.07)
Basophils Absolute: 0.1 10*3/uL (ref 0.0–0.1)
Basophils Relative: 0 %
Eosinophils Absolute: 0 10*3/uL (ref 0.0–0.5)
Eosinophils Relative: 0 %
HCT: 31.8 % — ABNORMAL LOW (ref 39.0–52.0)
Hemoglobin: 10.6 g/dL — ABNORMAL LOW (ref 13.0–17.0)
Immature Granulocytes: 2 %
Lymphocytes Relative: 5 %
Lymphs Abs: 1.1 10*3/uL (ref 0.7–4.0)
MCH: 29.3 pg (ref 26.0–34.0)
MCHC: 33.3 g/dL (ref 30.0–36.0)
MCV: 87.8 fL (ref 80.0–100.0)
Monocytes Absolute: 2.1 10*3/uL — ABNORMAL HIGH (ref 0.1–1.0)
Monocytes Relative: 9 %
Neutro Abs: 19.5 10*3/uL — ABNORMAL HIGH (ref 1.7–7.7)
Neutrophils Relative %: 84 %
Platelet Count: 367 10*3/uL (ref 150–400)
RBC: 3.62 MIL/uL — ABNORMAL LOW (ref 4.22–5.81)
RDW: 13.9 % (ref 11.5–15.5)
WBC Count: 23.3 10*3/uL — ABNORMAL HIGH (ref 4.0–10.5)
nRBC: 0 % (ref 0.0–0.2)

## 2022-11-28 LAB — GLUCOSE, CAPILLARY: Glucose-Capillary: 116 mg/dL — ABNORMAL HIGH (ref 70–99)

## 2022-11-28 LAB — CMP (CANCER CENTER ONLY)
ALT: 13 U/L (ref 0–44)
AST: 20 U/L (ref 15–41)
Albumin: 2.8 g/dL — ABNORMAL LOW (ref 3.5–5.0)
Alkaline Phosphatase: 111 U/L (ref 38–126)
Anion gap: 7 (ref 5–15)
BUN: 39 mg/dL — ABNORMAL HIGH (ref 8–23)
CO2: 27 mmol/L (ref 22–32)
Calcium: 9.2 mg/dL (ref 8.9–10.3)
Chloride: 103 mmol/L (ref 98–111)
Creatinine: 0.63 mg/dL (ref 0.61–1.24)
GFR, Estimated: >60 mL/min (ref >60)
Glucose, Bld: 123 mg/dL — ABNORMAL HIGH (ref 70–99)
Potassium: 4.6 mmol/L (ref 3.5–5.1)
Sodium: 137 mmol/L (ref 135–145)
Total Bilirubin: 1.4 mg/dL — ABNORMAL HIGH (ref 0.3–1.2)
Total Protein: 6.4 g/dL — ABNORMAL LOW (ref 6.5–8.1)

## 2022-11-28 MED ORDER — SODIUM CHLORIDE 0.9% FLUSH
10.0000 mL | Freq: Once | INTRAVENOUS | Status: AC
  Administered 2022-11-28: 10 mL

## 2022-11-28 MED ORDER — FLUDEOXYGLUCOSE F - 18 (FDG) INJECTION
8.8000 | Freq: Once | INTRAVENOUS | Status: AC
  Administered 2022-11-28: 8.8 via INTRAVENOUS

## 2022-11-29 ENCOUNTER — Other Ambulatory Visit: Payer: Self-pay

## 2022-11-29 ENCOUNTER — Emergency Department (HOSPITAL_COMMUNITY): Payer: Medicare PPO

## 2022-11-29 ENCOUNTER — Inpatient Hospital Stay (HOSPITAL_COMMUNITY)
Admission: EM | Admit: 2022-11-29 | Discharge: 2022-12-24 | DRG: 374 | Disposition: E | Payer: Medicare PPO | Attending: Internal Medicine | Admitting: Internal Medicine

## 2022-11-29 ENCOUNTER — Encounter (HOSPITAL_COMMUNITY): Payer: Self-pay | Admitting: Emergency Medicine

## 2022-11-29 DIAGNOSIS — E785 Hyperlipidemia, unspecified: Secondary | ICD-10-CM | POA: Diagnosis present

## 2022-11-29 DIAGNOSIS — Z515 Encounter for palliative care: Secondary | ICD-10-CM

## 2022-11-29 DIAGNOSIS — I10 Essential (primary) hypertension: Secondary | ICD-10-CM | POA: Diagnosis not present

## 2022-11-29 DIAGNOSIS — R000 Tachycardia, unspecified: Secondary | ICD-10-CM | POA: Diagnosis not present

## 2022-11-29 DIAGNOSIS — C786 Secondary malignant neoplasm of retroperitoneum and peritoneum: Secondary | ICD-10-CM | POA: Diagnosis present

## 2022-11-29 DIAGNOSIS — L24B1 Irritant contact dermatitis related to digestive stoma or fistula: Secondary | ICD-10-CM | POA: Diagnosis present

## 2022-11-29 DIAGNOSIS — E8770 Fluid overload, unspecified: Secondary | ICD-10-CM | POA: Diagnosis present

## 2022-11-29 DIAGNOSIS — D72829 Elevated white blood cell count, unspecified: Secondary | ICD-10-CM | POA: Diagnosis present

## 2022-11-29 DIAGNOSIS — Z452 Encounter for adjustment and management of vascular access device: Secondary | ICD-10-CM | POA: Diagnosis not present

## 2022-11-29 DIAGNOSIS — J9811 Atelectasis: Secondary | ICD-10-CM | POA: Diagnosis not present

## 2022-11-29 DIAGNOSIS — Z8249 Family history of ischemic heart disease and other diseases of the circulatory system: Secondary | ICD-10-CM

## 2022-11-29 DIAGNOSIS — E8809 Other disorders of plasma-protein metabolism, not elsewhere classified: Secondary | ICD-10-CM | POA: Diagnosis present

## 2022-11-29 DIAGNOSIS — N400 Enlarged prostate without lower urinary tract symptoms: Secondary | ICD-10-CM | POA: Diagnosis present

## 2022-11-29 DIAGNOSIS — Z806 Family history of leukemia: Secondary | ICD-10-CM

## 2022-11-29 DIAGNOSIS — E876 Hypokalemia: Secondary | ICD-10-CM | POA: Diagnosis present

## 2022-11-29 DIAGNOSIS — K9423 Gastrostomy malfunction: Secondary | ICD-10-CM | POA: Diagnosis present

## 2022-11-29 DIAGNOSIS — R531 Weakness: Secondary | ICD-10-CM | POA: Diagnosis present

## 2022-11-29 DIAGNOSIS — C169 Malignant neoplasm of stomach, unspecified: Secondary | ICD-10-CM | POA: Diagnosis not present

## 2022-11-29 DIAGNOSIS — Z79899 Other long term (current) drug therapy: Secondary | ICD-10-CM | POA: Diagnosis not present

## 2022-11-29 DIAGNOSIS — A419 Sepsis, unspecified organism: Principal | ICD-10-CM | POA: Diagnosis present

## 2022-11-29 DIAGNOSIS — N179 Acute kidney failure, unspecified: Secondary | ICD-10-CM | POA: Diagnosis not present

## 2022-11-29 DIAGNOSIS — Z794 Long term (current) use of insulin: Secondary | ICD-10-CM

## 2022-11-29 DIAGNOSIS — K311 Adult hypertrophic pyloric stenosis: Secondary | ICD-10-CM | POA: Diagnosis present

## 2022-11-29 DIAGNOSIS — C772 Secondary and unspecified malignant neoplasm of intra-abdominal lymph nodes: Secondary | ICD-10-CM | POA: Diagnosis present

## 2022-11-29 DIAGNOSIS — E119 Type 2 diabetes mellitus without complications: Secondary | ICD-10-CM | POA: Diagnosis present

## 2022-11-29 DIAGNOSIS — C799 Secondary malignant neoplasm of unspecified site: Secondary | ICD-10-CM | POA: Diagnosis not present

## 2022-11-29 DIAGNOSIS — C482 Malignant neoplasm of peritoneum, unspecified: Secondary | ICD-10-CM | POA: Diagnosis not present

## 2022-11-29 DIAGNOSIS — Z7189 Other specified counseling: Secondary | ICD-10-CM

## 2022-11-29 DIAGNOSIS — Z434 Encounter for attention to other artificial openings of digestive tract: Secondary | ICD-10-CM | POA: Diagnosis not present

## 2022-11-29 DIAGNOSIS — Z803 Family history of malignant neoplasm of breast: Secondary | ICD-10-CM

## 2022-11-29 DIAGNOSIS — R0600 Dyspnea, unspecified: Secondary | ICD-10-CM | POA: Diagnosis not present

## 2022-11-29 DIAGNOSIS — D630 Anemia in neoplastic disease: Secondary | ICD-10-CM | POA: Diagnosis present

## 2022-11-29 DIAGNOSIS — Z66 Do not resuscitate: Secondary | ICD-10-CM | POA: Diagnosis present

## 2022-11-29 DIAGNOSIS — Z6823 Body mass index (BMI) 23.0-23.9, adult: Secondary | ICD-10-CM

## 2022-11-29 DIAGNOSIS — C168 Malignant neoplasm of overlapping sites of stomach: Secondary | ICD-10-CM | POA: Diagnosis present

## 2022-11-29 DIAGNOSIS — Z808 Family history of malignant neoplasm of other organs or systems: Secondary | ICD-10-CM

## 2022-11-29 DIAGNOSIS — R0989 Other specified symptoms and signs involving the circulatory and respiratory systems: Secondary | ICD-10-CM | POA: Diagnosis not present

## 2022-11-29 DIAGNOSIS — J90 Pleural effusion, not elsewhere classified: Secondary | ICD-10-CM | POA: Diagnosis present

## 2022-11-29 DIAGNOSIS — Z1152 Encounter for screening for COVID-19: Secondary | ICD-10-CM | POA: Diagnosis not present

## 2022-11-29 DIAGNOSIS — B370 Candidal stomatitis: Secondary | ICD-10-CM | POA: Diagnosis present

## 2022-11-29 DIAGNOSIS — F419 Anxiety disorder, unspecified: Secondary | ICD-10-CM | POA: Diagnosis present

## 2022-11-29 DIAGNOSIS — R0609 Other forms of dyspnea: Secondary | ICD-10-CM | POA: Diagnosis present

## 2022-11-29 DIAGNOSIS — E43 Unspecified severe protein-calorie malnutrition: Secondary | ICD-10-CM | POA: Diagnosis not present

## 2022-11-29 DIAGNOSIS — R0602 Shortness of breath: Principal | ICD-10-CM | POA: Diagnosis present

## 2022-11-29 DIAGNOSIS — Z888 Allergy status to other drugs, medicaments and biological substances status: Secondary | ICD-10-CM

## 2022-11-29 DIAGNOSIS — Z48813 Encounter for surgical aftercare following surgery on the respiratory system: Secondary | ICD-10-CM | POA: Diagnosis not present

## 2022-11-29 DIAGNOSIS — I9581 Postprocedural hypotension: Secondary | ICD-10-CM | POA: Diagnosis present

## 2022-11-29 DIAGNOSIS — G893 Neoplasm related pain (acute) (chronic): Secondary | ICD-10-CM | POA: Diagnosis not present

## 2022-11-29 DIAGNOSIS — Z880 Allergy status to penicillin: Secondary | ICD-10-CM

## 2022-11-29 DIAGNOSIS — M7989 Other specified soft tissue disorders: Secondary | ICD-10-CM | POA: Diagnosis not present

## 2022-11-29 DIAGNOSIS — R54 Age-related physical debility: Secondary | ICD-10-CM | POA: Diagnosis present

## 2022-11-29 DIAGNOSIS — K219 Gastro-esophageal reflux disease without esophagitis: Secondary | ICD-10-CM | POA: Diagnosis present

## 2022-11-29 DIAGNOSIS — Y838 Other surgical procedures as the cause of abnormal reaction of the patient, or of later complication, without mention of misadventure at the time of the procedure: Secondary | ICD-10-CM | POA: Diagnosis present

## 2022-11-29 DIAGNOSIS — R846 Abnormal cytological findings in specimens from respiratory organs and thorax: Secondary | ICD-10-CM | POA: Diagnosis not present

## 2022-11-29 DIAGNOSIS — K632 Fistula of intestine: Secondary | ICD-10-CM | POA: Diagnosis not present

## 2022-11-29 DIAGNOSIS — R180 Malignant ascites: Secondary | ICD-10-CM | POA: Diagnosis present

## 2022-11-29 DIAGNOSIS — R188 Other ascites: Secondary | ICD-10-CM | POA: Diagnosis not present

## 2022-11-29 DIAGNOSIS — J984 Other disorders of lung: Secondary | ICD-10-CM | POA: Diagnosis not present

## 2022-11-29 DIAGNOSIS — R918 Other nonspecific abnormal finding of lung field: Secondary | ICD-10-CM | POA: Diagnosis not present

## 2022-11-29 DIAGNOSIS — G4733 Obstructive sleep apnea (adult) (pediatric): Secondary | ICD-10-CM | POA: Diagnosis present

## 2022-11-29 LAB — CBC
HCT: 31.5 % — ABNORMAL LOW (ref 39.0–52.0)
Hemoglobin: 10 g/dL — ABNORMAL LOW (ref 13.0–17.0)
MCH: 28.7 pg (ref 26.0–34.0)
MCHC: 31.7 g/dL (ref 30.0–36.0)
MCV: 90.3 fL (ref 80.0–100.0)
Platelets: 307 10*3/uL (ref 150–400)
RBC: 3.49 MIL/uL — ABNORMAL LOW (ref 4.22–5.81)
RDW: 14.1 % (ref 11.5–15.5)
WBC: 20.8 10*3/uL — ABNORMAL HIGH (ref 4.0–10.5)
nRBC: 0 % (ref 0.0–0.2)

## 2022-11-29 LAB — CREATININE, SERUM
Creatinine, Ser: 0.63 mg/dL (ref 0.61–1.24)
GFR, Estimated: >60 mL/min (ref >60)

## 2022-11-29 LAB — RESP PANEL BY RT-PCR (RSV, FLU A&B, COVID)  RVPGX2
Influenza A by PCR: NEGATIVE
Influenza B by PCR: NEGATIVE
Resp Syncytial Virus by PCR: NEGATIVE
SARS Coronavirus 2 by RT PCR: NEGATIVE

## 2022-11-29 LAB — URINALYSIS, ROUTINE W REFLEX MICROSCOPIC
Bilirubin Urine: NEGATIVE
Glucose, UA: NEGATIVE mg/dL
Hgb urine dipstick: NEGATIVE
Ketones, ur: NEGATIVE mg/dL
Leukocytes,Ua: NEGATIVE
Nitrite: NEGATIVE
Protein, ur: NEGATIVE mg/dL
Specific Gravity, Urine: 1.015 (ref 1.005–1.030)
pH: 5 (ref 5.0–8.0)

## 2022-11-29 LAB — BRAIN NATRIURETIC PEPTIDE: B Natriuretic Peptide: 51.8 pg/mL (ref 0.0–100.0)

## 2022-11-29 LAB — COMPREHENSIVE METABOLIC PANEL
ALT: 18 U/L (ref 0–44)
AST: 27 U/L (ref 15–41)
Albumin: 2 g/dL — ABNORMAL LOW (ref 3.5–5.0)
Alkaline Phosphatase: 119 U/L (ref 38–126)
Anion gap: 10 (ref 5–15)
BUN: 41 mg/dL — ABNORMAL HIGH (ref 8–23)
CO2: 25 mmol/L (ref 22–32)
Calcium: 8.7 mg/dL — ABNORMAL LOW (ref 8.9–10.3)
Chloride: 102 mmol/L (ref 98–111)
Creatinine, Ser: 0.62 mg/dL (ref 0.61–1.24)
GFR, Estimated: >60 mL/min (ref >60)
Glucose, Bld: 122 mg/dL — ABNORMAL HIGH (ref 70–99)
Potassium: 4.3 mmol/L (ref 3.5–5.1)
Sodium: 137 mmol/L (ref 135–145)
Total Bilirubin: 1.7 mg/dL — ABNORMAL HIGH (ref 0.3–1.2)
Total Protein: 6.2 g/dL — ABNORMAL LOW (ref 6.5–8.1)

## 2022-11-29 LAB — MRSA NEXT GEN BY PCR, NASAL: MRSA by PCR Next Gen: NOT DETECTED

## 2022-11-29 LAB — CBC WITH DIFFERENTIAL/PLATELET
Abs Immature Granulocytes: 0.45 10*3/uL — ABNORMAL HIGH (ref 0.00–0.07)
Basophils Absolute: 0.1 10*3/uL (ref 0.0–0.1)
Basophils Relative: 0 %
Eosinophils Absolute: 0 10*3/uL (ref 0.0–0.5)
Eosinophils Relative: 0 %
HCT: 32.8 % — ABNORMAL LOW (ref 39.0–52.0)
Hemoglobin: 10.5 g/dL — ABNORMAL LOW (ref 13.0–17.0)
Immature Granulocytes: 2 %
Lymphocytes Relative: 5 %
Lymphs Abs: 1.1 10*3/uL (ref 0.7–4.0)
MCH: 28.9 pg (ref 26.0–34.0)
MCHC: 32 g/dL (ref 30.0–36.0)
MCV: 90.4 fL (ref 80.0–100.0)
Monocytes Absolute: 2.1 10*3/uL — ABNORMAL HIGH (ref 0.1–1.0)
Monocytes Relative: 9 %
Neutro Abs: 19.7 10*3/uL — ABNORMAL HIGH (ref 1.7–7.7)
Neutrophils Relative %: 84 %
Platelets: 346 10*3/uL (ref 150–400)
RBC: 3.63 MIL/uL — ABNORMAL LOW (ref 4.22–5.81)
RDW: 14.1 % (ref 11.5–15.5)
WBC: 23.5 10*3/uL — ABNORMAL HIGH (ref 4.0–10.5)
nRBC: 0 % (ref 0.0–0.2)

## 2022-11-29 LAB — I-STAT CG4 LACTIC ACID, ED: Lactic Acid, Venous: 1.8 mmol/L (ref 0.5–1.9)

## 2022-11-29 LAB — LACTATE DEHYDROGENASE: LDH: 121 U/L (ref 98–192)

## 2022-11-29 LAB — GLUCOSE, CAPILLARY
Glucose-Capillary: 99 mg/dL (ref 70–99)
Glucose-Capillary: 101 mg/dL — ABNORMAL HIGH (ref 70–99)
Glucose-Capillary: 90 mg/dL (ref 70–99)

## 2022-11-29 LAB — PROTEIN, TOTAL: Total Protein: 6.1 g/dL — ABNORMAL LOW (ref 6.5–8.1)

## 2022-11-29 LAB — PROTIME-INR
INR: 1.2 (ref 0.8–1.2)
Prothrombin Time: 15.3 s — ABNORMAL HIGH (ref 11.4–15.2)

## 2022-11-29 MED ORDER — INSULIN ASPART 100 UNIT/ML IJ SOLN
0.0000 [IU] | INTRAMUSCULAR | Status: DC
  Administered 2022-11-30 – 2022-12-01 (×2): 1 [IU] via SUBCUTANEOUS
  Administered 2022-12-01: 2 [IU] via SUBCUTANEOUS
  Administered 2022-12-01: 1 [IU] via SUBCUTANEOUS

## 2022-11-29 MED ORDER — VANCOMYCIN HCL IN DEXTROSE 1-5 GM/200ML-% IV SOLN
1000.0000 mg | Freq: Once | INTRAVENOUS | Status: DC
  Filled 2022-11-29: qty 200

## 2022-11-29 MED ORDER — FUROSEMIDE 10 MG/ML IJ SOLN
60.0000 mg | Freq: Once | INTRAMUSCULAR | Status: AC
  Administered 2022-11-29: 60 mg via INTRAVENOUS
  Filled 2022-11-29: qty 6

## 2022-11-29 MED ORDER — IPRATROPIUM BROMIDE 0.02 % IN SOLN
0.5000 mg | Freq: Four times a day (QID) | RESPIRATORY_TRACT | Status: DC | PRN
  Administered 2022-12-04: 0.5 mg via RESPIRATORY_TRACT
  Filled 2022-11-29: qty 2.5

## 2022-11-29 MED ORDER — METRONIDAZOLE 500 MG/100ML IV SOLN
500.0000 mg | Freq: Two times a day (BID) | INTRAVENOUS | Status: DC
  Administered 2022-11-29 – 2022-12-02 (×7): 500 mg via INTRAVENOUS
  Filled 2022-11-29 (×7): qty 100

## 2022-11-29 MED ORDER — ALBUMIN HUMAN 25 % IV SOLN
25.0000 g | Freq: Four times a day (QID) | INTRAVENOUS | Status: AC
  Administered 2022-11-29 – 2022-11-30 (×4): 25 g via INTRAVENOUS
  Filled 2022-11-29 (×4): qty 100

## 2022-11-29 MED ORDER — ORAL CARE MOUTH RINSE
15.0000 mL | OROMUCOSAL | Status: DC
  Administered 2022-11-29 – 2022-12-06 (×28): 15 mL via OROMUCOSAL

## 2022-11-29 MED ORDER — ENOXAPARIN SODIUM 40 MG/0.4ML IJ SOSY
40.0000 mg | PREFILLED_SYRINGE | INTRAMUSCULAR | Status: DC
  Administered 2022-11-29 – 2022-12-05 (×7): 40 mg via SUBCUTANEOUS
  Filled 2022-11-29 (×7): qty 0.4

## 2022-11-29 MED ORDER — FUROSEMIDE 10 MG/ML IJ SOLN
40.0000 mg | Freq: Once | INTRAMUSCULAR | Status: AC
  Administered 2022-11-29: 40 mg via INTRAVENOUS
  Filled 2022-11-29: qty 4

## 2022-11-29 MED ORDER — VANCOMYCIN HCL 1250 MG/250ML IV SOLN
1250.0000 mg | Freq: Two times a day (BID) | INTRAVENOUS | Status: DC
  Administered 2022-11-29: 1250 mg via INTRAVENOUS
  Filled 2022-11-29 (×2): qty 250

## 2022-11-29 MED ORDER — ONDANSETRON HCL 4 MG PO TABS: 4.0000 mg | ORAL_TABLET | Freq: Four times a day (QID) | ORAL | Status: DC | PRN

## 2022-11-29 MED ORDER — CHLORHEXIDINE GLUCONATE CLOTH 2 % EX PADS
6.0000 | MEDICATED_PAD | Freq: Every day | CUTANEOUS | Status: DC
  Administered 2022-11-29 – 2022-12-06 (×8): 6 via TOPICAL

## 2022-11-29 MED ORDER — SODIUM CHLORIDE 0.9% FLUSH
3.0000 mL | Freq: Two times a day (BID) | INTRAVENOUS | Status: DC
  Administered 2022-11-29 – 2022-12-06 (×13): 3 mL via INTRAVENOUS

## 2022-11-29 MED ORDER — ZINC OXIDE 40 % EX OINT
TOPICAL_OINTMENT | Freq: Three times a day (TID) | CUTANEOUS | Status: DC
  Administered 2022-11-30: 1 via TOPICAL
  Filled 2022-11-29 (×2): qty 57

## 2022-11-29 MED ORDER — SODIUM CHLORIDE 0.9 % IV SOLN
2.0000 g | Freq: Once | INTRAVENOUS | Status: AC
  Administered 2022-11-29: 2 g via INTRAVENOUS
  Filled 2022-11-29: qty 12.5

## 2022-11-29 MED ORDER — ORAL CARE MOUTH RINSE: 15.0000 mL | OROMUCOSAL | Status: DC | PRN

## 2022-11-29 MED ORDER — LEVALBUTEROL HCL 0.63 MG/3ML IN NEBU
0.6300 mg | INHALATION_SOLUTION | Freq: Four times a day (QID) | RESPIRATORY_TRACT | Status: DC | PRN
  Administered 2022-12-04: 0.63 mg via RESPIRATORY_TRACT
  Filled 2022-11-29: qty 3

## 2022-11-29 MED ORDER — ACETAMINOPHEN 325 MG PO TABS: 650.0000 mg | ORAL_TABLET | Freq: Four times a day (QID) | ORAL | Status: DC | PRN

## 2022-11-29 MED ORDER — SODIUM CHLORIDE 0.9 % IV SOLN
2.0000 g | Freq: Three times a day (TID) | INTRAVENOUS | Status: DC
  Administered 2022-11-29 – 2022-12-03 (×11): 2 g via INTRAVENOUS
  Filled 2022-11-29 (×11): qty 12.5

## 2022-11-29 MED ORDER — METRONIDAZOLE 500 MG/100ML IV SOLN
500.0000 mg | Freq: Once | INTRAVENOUS | Status: AC
  Administered 2022-11-29: 500 mg via INTRAVENOUS
  Filled 2022-11-29: qty 100

## 2022-11-29 MED ORDER — ACETAMINOPHEN 650 MG RE SUPP: 650.0000 mg | Freq: Four times a day (QID) | RECTAL | Status: DC | PRN

## 2022-11-29 MED ORDER — MORPHINE SULFATE (PF) 2 MG/ML IV SOLN: 1.0000 mg | INTRAVENOUS | Status: DC | PRN

## 2022-11-29 MED ORDER — VANCOMYCIN HCL 1500 MG/300ML IV SOLN
1500.0000 mg | Freq: Once | INTRAVENOUS | Status: AC
  Administered 2022-11-29: 1500 mg via INTRAVENOUS
  Filled 2022-11-29: qty 300

## 2022-11-29 MED ORDER — ONDANSETRON HCL 4 MG/2ML IJ SOLN: 4.0000 mg | Freq: Four times a day (QID) | INTRAMUSCULAR | Status: DC | PRN

## 2022-11-29 MED ORDER — NYSTATIN 100000 UNIT/ML MT SUSP
5.0000 mL | Freq: Four times a day (QID) | OROMUCOSAL | Status: DC
  Administered 2022-11-30 – 2022-12-05 (×20): 500000 [IU] via ORAL
  Filled 2022-11-29 (×21): qty 5

## 2022-11-29 NOTE — Progress Notes (Signed)
This nurse spoke with this patient/wife as the patient informed this nurse that he is absolutely miserable at this time. Patient stated he has spoken to Dr. Patterson Hammersmith in the past and would like to discuss with palliative his options to pursue comfort measures to have a better quality of the time he has left; wife was in agreement to this. Informed Dr. Marland Mcalpine (who stated he will attempt to get in contact with Dr. Mosetta Putt for possible follow up but we will still focus on his current SOB concern). Palliative consult placed and Dr. Linna Darner made aware of this discussion (per Dr. Linna Darner palliative team will try to see tomorrow on their rounds).

## 2022-11-29 NOTE — ED Triage Notes (Signed)
Pt reports SHOB, abdominal distention, and J-tube leaking. Denies fevers, and denies pain.

## 2022-11-29 NOTE — Progress Notes (Signed)
Pharmacy Antibiotic Note  Steven Wolf is a 67 y.o. male with PMH stage IV gastric adenocarcinoma with peritoneal mets, s/p J-G tube placement and chronic TPN, presents 9/6 w/ SOB and abdominal distention. Found to be volume overloaded with bilateral pleural effusions and abdominal ascites. Given immunosuppression, Pharmacy has been consulted for vancomycin and cefepime dosing   Plan: Vancomycin 1000 mg IV now, then 1250 mg IV q12 hr (est AUC 510 based on SCr 0.8; Vd 0.72) Measure vancomycin AUC at steady state as indicated SCr q48 while on vanc MRSA PCR ordered; f/u and narrow vanc as appropriate Cefepime 2 g IV q8 hr Flagyl per MD; dosing appropriate     Temp (24hrs), Avg:97.9 F (36.6 C), Min:97.9 F (36.6 C), Max:97.9 F (36.6 C)  Recent Labs  Lab 11/28/22 1012 12/16/2022 1130 12/23/2022 1228  WBC 23.3* 23.5*  --   CREATININE 0.63 0.62  --   LATICACIDVEN  --   --  1.8    Estimated Creatinine Clearance: 95.7 mL/min (by C-G formula based on SCr of 0.62 mg/dL).    Allergies  Allergen Reactions   Ramipril Other (See Comments)    Elevated liver enzymes   Penicillins Other (See Comments)    ? Reaction (as child)    Antimicrobials this admission: 9/6 vancomycin >>  9/6 cefepime >>  9/6 metronidazole >>   Dose adjustments this admission: N/a  Microbiology results: 9/6 BCx: sent 9/6 UCx: sent  9/6 MRSA PCR: needs collected  Thank you for allowing pharmacy to be a part of this patient's care.  Jordie Schreur A 12/23/2022 2:19 PM

## 2022-11-29 NOTE — Progress Notes (Signed)
Pharmacy Note   A consult was received from an ED physician for vancomycin and  per pharmacy dosing.    The patient's profile has been reviewed for ht/wt/allergies/indication/available labs.    A one time order has been placed for vancomycin 1500 mg IV x1 and cefepime 2 gr IV x1 .    Further antibiotics/pharmacy consults should be ordered by admitting physician if indicated.                       Thank you,  Adalberto Cole, PharmD, BCPS 11/30/2022 12:28 PM

## 2022-11-29 NOTE — Progress Notes (Signed)
PHARMACY - TOTAL PARENTERAL NUTRITION CONSULT NOTE   Indication: continuation of chronic home TPN  Patient Measurements:     There is no height or weight on file to calculate BMI. Usual Weight: 75-85 kg  Assessment: 89 yoM with stage IV gastric adenocarcinoma and peritoneal metastasis, on chronic TPN at home. Does have J-G tube for feeding/venting, but has been unable to tolerate any jejunal feeding recently. Presents 9/6 with pleural effusions, abdominal distention and ascites, LE edema, and persistent leaking around J-tube insertion site which has worsened, and now appears purulent. 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 Noted to have stable chronic leukocytosis at baseline. Patient will be admitted for rule-out of sepsis; Pharmacy to continue TPN while admitted.  Pharmacist contacted home infusion pharmacist Jill Alexanders with [facility] [phone]) to obtain most recent TPN formulation  Plan: Labs reviewed: T bili mildly elevated; Mg/Phos not available at this time No urgent repletion needed TPN consult received after the cut-off for new orders today. Will re-assess labs in AM and start TPN at 18:00 tomorrow Implanted port in place and available for TPN access   Ronasia Isola A 11/30/2022,4:28 PM

## 2022-11-29 NOTE — Procedures (Signed)
Thoracentesis  Procedure Note  KRISHAV LASSILA  [id number]  05-12-1955  Date:12/06/2022  Time:2:59 PM   Provider Performing:Dillinger Aston A Aspen Lawrance   Procedure: Thoracentesis with imaging guidance (54270)  Indication(s) Pleural Effusion  Consent Patients verbal consent  Anesthesia Topical only with 1% lidocaine    Time Out Verified patient identification, verified procedure, site/side was marked, verified correct patient position, special equipment/implants available, medications/allergies/relevant history reviewed, required imaging and test results available.   Sterile Technique Maximal sterile technique including full sterile barrier drape, hand hygiene, sterile gown, sterile gloves, mask, hair covering, sterile ultrasound probe cover (if used).  Procedure Description Ultrasound was used to identify appropriate pleural anatomy for placement and overlying skin marked.  Area of drainage cleaned and draped in sterile fashion. Lidocaine was used to anesthetize the skin and subcutaneous tissue.  1700 cc's of dark amber appearing fluid was drained from the left pleural space. Catheter then removed and bandaid applied to site.   Complications/Tolerance None; patient tolerated the procedure well. Chest X-ray is ordered to confirm no post-procedural complication.   EBL none   Specimen(s) Pleural fluidsent for analysis, large bottle to be sent to lab

## 2022-11-29 NOTE — Consult Note (Signed)
NAME:  Steven Wolf, MRN:  [id number], DOB:  Oct 25, 1955, LOS: 0 ADMISSION DATE:  12-19-22, CONSULTATION DATE: 12-19-2022 REFERRING MD: Dr. 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, peritoneal metastasis came in with worsening shortness of breath, leg edema  Asked to see for pleural effusions  Pertinent  Medical History   Past Medical History:  Diagnosis Date   Anxiety    Benign prostatic hypertrophy    Diverticulosis of colon    GERD (gastroesophageal reflux disease)    Sullivan Lone syndrome    History of kidney stones    Hypertension    Microscopic hematuria    Motion sickness    ocean boats, curvy roads   Nephrolithiasis     X 2; Dr Aldean Ast   Sleep apnea    USES CPAP   Syncope 02/2007   NOCTURNAL POST MICTURTION   Urticaria      Significant Hospital Events: Including procedures, antibiotic start and stop dates in addition to other pertinent events   Chest x-ray with large pleural effusion breathing 7 shortness of breath with every activity  Interim History / Subjective:  denies any chest pains or chest discomfort   Objective   Blood pressure 135/87, pulse (!) 139, temperature 97.9 F (36.6 C), temperature source Oral, resp. rate (!) 23, SpO2 93%.        Intake/Output Summary (Last 24 hours) at December 19, 2022 1430 Last data filed at 12/19/2022 1401 Gross per 24 hour  Intake 200 ml  Output --  Net 200 ml   There were no vitals filed for this visit.  Examination: General: Previously elderly, chronically ill-appearing HEENT T: Dry oral mucosa Lungs: Decreased air entry bilaterally at the bases Cardiovascular: S1-S2 appreciated Abdomen: Full, bowel sounds appreciated Extremities: edema Neuro: Awake alert interactive GU:   I reviewed notes  PET scan from 11/28/2022 reviewed Resolved Hospital Problem list     Assessment & Plan:  Metastatic gastric  cancer with gastric outlet obstruction Peritoneal carcinomatosis  Possible infection around his J-tube  Hypoalbuminemia  Malnutrition -Continue TPN  Third spacing from help albuminemia  Large left pleural effusion, small right pleural effusion  Patient will benefit from thoracentesis  Will perform thoracentesis and send for analysis  Concerns for sepsis -Follow cultures -Empiric antibiotics  Best Practice (right click and "Reselect all SmartList Selections" daily)   Diet/type: NPO DVT prophylaxis: SCD GI prophylaxis: N/A Lines: N/A Foley:  N/A Code Status:  DNR Last date of multidisciplinary goals of care discussion [Per primary]  Labs   CBC: Recent Labs  Lab 11/28/22 1012 12/19/22 1130  WBC 23.3* 23.5*  NEUTROABS 19.5* 19.7*  HGB 10.6* 10.5*  HCT 31.8* 32.8*  MCV 87.8 90.4  PLT 367 346    Basic Metabolic Panel: Recent Labs  Lab 11/28/22 1012 12-19-22 1130  NA 137 137  K 4.6 4.3  CL 103 102  CO2 27 25  GLUCOSE 123* 122*  BUN 39* 41*  CREATININE 0.63 0.62  CALCIUM 9.2 8.7*   GFR: Estimated Creatinine Clearance: 95.7 mL/min (by C-G formula based on SCr of 0.62 mg/dL). Recent Labs  Lab 11/28/22 1012 December 19, 2022 1130 12/19/2022 1228  WBC 23.3* 23.5*  --   LATICACIDVEN  --   --  1.8    Liver Function Tests: Recent Labs  Lab 11/28/22 1012 2022/12/19 1130  AST 20 27  ALT 13 18  ALKPHOS 111 119  BILITOT 1.4* 1.7*  PROT 6.4*  6.2*  ALBUMIN 2.8* 2.0*   No results for input(s): "LIPASE", "AMYLASE" in the last 168 hours. No results for input(s): "AMMONIA" in the last 168 hours.  ABG No results found for: "PHART", "PCO2ART", "PO2ART", "HCO3", "TCO2", "ACIDBASEDEF", "O2SAT"   Coagulation Profile: Recent Labs  Lab 2022-12-29 1130  INR 1.2    Cardiac Enzymes: No results for input(s): "CKTOTAL", "CKMB", "CKMBINDEX", "TROPONINI" in the last 168 hours.  HbA1C: Hgb A1c MFr Bld  Date/Time Value Ref Range Status  06/18/2022 09:23 AM 5.7 4.6 - 6.5  % Final    Comment:    Glycemic Control Guidelines for People with Diabetes:Non Diabetic:  <6%Goal of Therapy: <7%Additional Action Suggested:  >8%   06/14/2021 09:36 AM 5.6 4.6 - 6.5 % Final    Comment:    Glycemic Control Guidelines for People with Diabetes:Non Diabetic:  <6%Goal of Therapy: <7%Additional Action Suggested:  >8%     CBG: Recent Labs  Lab 11/28/22 1058  GLUCAP 116*    Review of Systems:   Shortness of breath  Past Medical History:  He,  has a past medical history of Anxiety, Benign prostatic hypertrophy, Diverticulosis of colon, GERD (gastroesophageal reflux disease), Sullivan Lone syndrome, History of kidney stones, Hypertension, Microscopic hematuria, Motion sickness, Nephrolithiasis, Sleep apnea, Syncope (02/2007), and Urticaria.   Surgical History:   Past Surgical History:  Procedure Laterality Date   APPENDECTOMY     BIOPSY  10/16/2022   Procedure: BIOPSY;  Surgeon: Lemar Lofty., MD;  Location: [facility];  Service: Gastroenterology;;   COLONOSCOPY  2010   NORMAL(pt states 2 other colonoscopies in the past)   COLONOSCOPY W/ POLYPECTOMY  2002   Tics 2005 & 2010; due 2020, Dr Jarold Motto   ESOPHAGOGASTRODUODENOSCOPY (EGD) WITH PROPOFOL N/A 09/30/2022   Procedure: ESOPHAGOGASTRODUODENOSCOPY (EGD) WITH BIOPSY;  Surgeon: Midge Minium, MD;  [facility];  Service: Endoscopy;  Laterality: N/A;   ESOPHAGOGASTRODUODENOSCOPY (EGD) WITH PROPOFOL N/A 10/16/2022   Procedure: ESOPHAGOGASTRODUODENOSCOPY (EGD) WITH PROPOFOL;  Surgeon: Meridee Score Netty Starring., MD;  Location: [facility];  Service: Gastroenterology;  Laterality: N/A;   EUS N/A 10/16/2022   Procedure: UPPER ENDOSCOPIC ULTRASOUND (EUS) RADIAL;  Surgeon: Lemar Lofty., MD;  Location: [facility];  Service: Gastroenterology;  Laterality: N/A;   LAPAROSCOPY N/A 10/16/2022   Procedure: DIAGNOSTIC LAPAROSCOPY, OPEN J TUBE PLACEMENT;  Surgeon: Almond Lint, MD;  Location: [facility];   Service: General;  Laterality: N/A;   LIVER BIOPSY     X 1 for elevated LFTs with NEGATIVE  hepatitis serologies   PORTACATH PLACEMENT N/A 10/16/2022   Procedure: INSERTION PORT-A-CATH;  Surgeon: Almond Lint, MD;  Location: [facility];  Service: General;  Laterality: N/A;   TONSILLECTOMY     TONSILLECTOMY     PT STATES HAS HAD IT TWICE   UPPER GASTROINTESTINAL ENDOSCOPY  1995     Social History:   reports that he has never smoked. He has never used smokeless tobacco. He reports that he does not drink alcohol and does not use drugs.   Family History:  His family history includes Breast cancer in his mother; Heart attack in his paternal grandfather; Heart disease in his father; Leukemia in his paternal uncle; Polycythemia in his father and paternal uncle; Rheumatologic disease in his mother; Skin cancer in his half-sister; Sleep apnea in his sister; Stroke in his maternal grandmother. There is no history of Diabetes, Colon cancer, Colon polyps, Esophageal cancer, Stomach cancer, or Rectal cancer.   Allergies Allergies  Allergen Reactions   Ramipril  Other (See Comments)    Elevated liver enzymes   Penicillins Other (See Comments)    ? Reaction (as child)     Virl Diamond, MD [place] [facility] Pager: See Loretha Stapler

## 2022-11-29 NOTE — ED Provider Notes (Signed)
[place] [facility]MENT AT [facility] Provider Note   CSN: [id number] Arrival date & time: December 20, 2022  1057     History  Chief Complaint  Patient presents with   Shortness of Breath    Steven Wolf is a 67 y.o. male.   Shortness of Breath 67 year old male history of gastric adenocarcinoma stage IV peritoneal metastasis status post J-tube placement and TPN dependence presenting for shortness of breath, abdominal distention, leg swelling.  Patient states he has had chronic issues with his G-tube which drains has not been able to use for feeding.  He feels exam is more distended lately, and he has more drainage from his J-tube.  He said a little bit of nausea and vomiting as well.  He said minimal bowel output given he does not take anything by mouth and only uses TPN.  He states he had leg swelling, shortness of breath, Donnell distention for the last few days which is worsened.  He felt worse this morning so presents for evaluation.  He had a PET scan done yesterday which has not been read yet.  No fevers or chills or sick contacts.  Denies any history of heart failure or blood clots.     Home Medications Prior to Admission medications   Medication Sig Start Date End Date Taking? Authorizing Provider  omeprazole (PRILOSEC) 20 MG capsule Take 20 mg by mouth. 08/13/22  Yes [provider]  sodium chloride 0.9 % infusion Inject into the vein. 11/26/22  Yes [provider]  Accu-Chek Softclix Lancets lancets Use to test blood sugar 4 times a day 11/20/22   Pincus Sanes, MD  blood glucose meter kit and supplies KIT Dispense based on patient and insurance preference. Use up to four times daily as directed. 11/03/22   Briant Cedar, MD  Blood Glucose Monitoring Suppl (ACCU-CHEK GUIDE) w/Device KIT Use to test blood sugar 4 times a day 11/04/22   Briant Cedar, MD  fluconazole (DIFLUCAN) 10 MG/ML suspension Take 10 mLs (100 mg total) by mouth  daily. 11/26/22     FLUoxetine (PROZAC) 20 MG tablet Take 1 tablet (20 mg total) by mouth daily. 11/20/22   Pincus Sanes, MD  glucose blood (ACCU-CHEK GUIDE) test strip Use to test blood sugar four times daily as directed 11/20/22   Pincus Sanes, MD  insulin aspart (NOVOLOG) 100 UNIT/ML FlexPen Inject  into the skin according to sliding scale: If CBG 121-150 inject 2 units,151-200 3 units,201-250 5 units,251-300 8 units,301-350 11 units,351-400 15 units,over 400 call MD 11/20/22   Pincus Sanes, MD  Insulin Pen Needle (TECHLITE PLUS PEN NEEDLES) 32G X 4 MM MISC Use to inject insulin as directed 11/20/22   Pincus Sanes, MD  Omeprazole-Sodium Bicarbonate 2-84 MG/ML SUSR Take 20 mLs by mouth daily. 11/07/22   [name], MD  ondansetron (ZOFRAN-ODT) 4 MG disintegrating tablet Dissolve 1-2 tablets (4-8 mg total) by mouth every 8 (eight) hours as needed for nausea or vomiting. 11/07/22   [name], MD  pantoprazole (PROTONIX) 40 MG injection Inject 40 mg into the vein daily. 11/03/22 12/03/22  Briant Cedar, MD  PRESCRIPTION MEDICATION CPAP- At bedtime as determined by the patient    [provider]      Allergies    Ramipril and Penicillins    Review of Systems   Review of Systems  Respiratory:  Positive for shortness of breath.   Review of systems completed and notable as per  HPI.  ROS otherwise negative.   Physical Exam Updated Vital Signs BP 97/69   Pulse (!) 129   Temp 97.7 F (36.5 C) (Oral)   Resp 16   Ht 6' (1.829 m)   SpO2 100%   BMI 24.07 kg/m  Physical Exam Vitals and nursing note reviewed.  Constitutional:      Appearance: He is ill-appearing.  HENT:     Head: Normocephalic and atraumatic.     Mouth/Throat:     Mouth: Mucous membranes are moist.     Pharynx: Oropharynx is clear.  Eyes:     Extraocular Movements: Extraocular movements intact.     Conjunctiva/sclera: Conjunctivae normal.     Pupils: Pupils are equal, round, and reactive to light.   Cardiovascular:     Rate and Rhythm: Regular rhythm. Tachycardia present.     Heart sounds: No murmur heard. Pulmonary:     Effort: Pulmonary effort is normal. Tachypnea present. No respiratory distress.     Breath sounds: Normal breath sounds.  Abdominal:     General: There is distension.     Palpations: Abdomen is soft.     Tenderness: There is no abdominal tenderness. There is no guarding or rebound.  Musculoskeletal:        General: No swelling.     Cervical back: Neck supple.     Right lower leg: Edema present.     Left lower leg: Edema present.  Skin:    General: Skin is warm and dry.     Capillary Refill: Capillary refill takes less than 2 seconds.  Neurological:     General: No focal deficit present.     Mental Status: He is alert and oriented to person, place, and time. Mental status is at baseline.  Psychiatric:        Mood and Affect: Mood normal.     ED Results / Procedures / Treatments   Labs (all labs ordered are listed, but only abnormal results are displayed) Labs Reviewed  COMPREHENSIVE METABOLIC PANEL - Abnormal; Notable for the following components:      Result Value   Glucose, Bld 122 (*)    BUN 41 (*)    Calcium 8.7 (*)    Total Protein 6.2 (*)    Albumin 2.0 (*)    Total Bilirubin 1.7 (*)    All other components within normal limits  CBC WITH DIFFERENTIAL/PLATELET - Abnormal; Notable for the following components:   WBC 23.5 (*)    RBC 3.63 (*)    Hemoglobin 10.5 (*)    HCT 32.8 (*)    Neutro Abs 19.7 (*)    Monocytes Absolute 2.1 (*)    Abs Immature Granulocytes 0.45 (*)    All other components within normal limits  PROTIME-INR - Abnormal; Notable for the following components:   Prothrombin Time 15.3 (*)    All other components within normal limits  PROTEIN, TOTAL - Abnormal; Notable for the following components:   Total Protein 6.1 (*)    All other components within normal limits  CBC - Abnormal; Notable for the following components:    WBC 20.8 (*)    RBC 3.49 (*)    Hemoglobin 10.0 (*)    HCT 31.5 (*)    All other components within normal limits  RESP PANEL BY RT-PCR (RSV, FLU A&B, COVID)  [id number]  MRSA NEXT GEN BY PCR, NASAL  CULTURE, BLOOD (ROUTINE X 2)  CULTURE, BLOOD (ROUTINE X 2)  BODY FLUID CULTURE W Romie Minus  STAIN  URINALYSIS, ROUTINE W REFLEX MICROSCOPIC  BRAIN NATRIURETIC PEPTIDE  LACTATE DEHYDROGENASE  CREATININE, SERUM  GLUCOSE, CAPILLARY  URINALYSIS, W/ REFLEX TO CULTURE (INFECTION SUSPECTED)  PROCALCITONIN  BODY FLUID CELL COUNT WITH DIFFERENTIAL  CHOLESTEROL, BODY FLUID  GLUCOSE, PLEURAL OR PERITONEAL FLUID  LACTATE DEHYDROGENASE, PLEURAL OR PERITONEAL FLUID  PROTEIN, PLEURAL OR PERITONEAL FLUID  COMPREHENSIVE METABOLIC PANEL  CBC  MAGNESIUM  PHOSPHORUS  I-STAT CG4 LACTIC ACID, ED  I-STAT CG4 LACTIC ACID, ED  CYTOLOGY - NON PAP    EKG None  Radiology DG CHEST PORT 1 VIEW  Result Date: Dec 26, 2022 CLINICAL DATA:  Shortness of breath with abdominal distension EXAM: PORTABLE CHEST 1 VIEW COMPARISON:  11/18/2022, CT 10/11/2022, PET CT 11/28/2022 FINDINGS: Left-sided central venous port tip over the cavoatrial region. Possible small right effusion with hazy right infrahilar atelectasis. Moderate left pleural effusion with possible loculation. Airspace disease at left base. IMPRESSION: 1. Moderate left pleural effusion with possible loculation. Airspace disease at left base may reflect atelectasis or pneumonia. 2. Possible small right effusion with right infrahilar atelectasis. Electronically Signed   By: Jasmine Pang M.D.   On: 26-Dec-2022 15:15   NM PET Image Initial (PI) Skull Base To Thigh  Result Date: 12/26/2022 CLINICAL DATA:  Initial treatment strategy for gastric adenocarcinoma diagnosed last month. EXAM: NUCLEAR MEDICINE PET SKULL BASE TO THIGH TECHNIQUE: 8.78 mCi F-18 FDG was injected intravenously. Full-ring PET imaging was performed from the skull base to thigh after the radiotracer. CT data  was obtained and used for attenuation correction and anatomic localization. Fasting blood glucose: 116 mg/dl COMPARISON:  Abdominopelvic CT 10/28/2022 and 10/23/2022. Chest CT 10/11/2022. FINDINGS: Mediastinal blood pool activity: SUV max 1.7 NECK: No hypermetabolic cervical lymph nodes are identified. No suspicious activity identified within the pharyngeal mucosal space. Incidental CT findings: none CHEST: Interval development of moderate left and small right pleural effusions with associated dependent atelectasis in both lungs. Anteriorly in the right upper lobe, there is a new hypermetabolic subpleural nodule measuring approximately 1.0 x 0.8 cm on image 26/7 (SUV max 3.4). This may be inflammatory as there was no abnormality in this area on CT performed just 6 weeks ago. No other hypermetabolic pulmonary activity or suspicious nodularity. 1.0 cm subcarinal node on image 83/4 is mildly hypermetabolic (SUV max 4.1). No other hypermetabolic mediastinal, hilar or axillary lymph nodes are identified. Possible small left internal mammary or anterior pleural-based hypermetabolic nodule (SUV max 3.2). Incidental CT findings: As above, new bilateral pleural effusions with associated compressive atelectasis in both lungs. Left subclavian Port-A-Cath extends to the mid SVC. Mild aortic atherosclerosis. ABDOMEN/PELVIS: There is no hypermetabolic activity within the liver, adrenal glands, spleen or pancreas. There is intense hypermetabolic activity and associated wall thickening of the gastric cardia (SUV max 14.6). There are poorly defined hypermetabolic lymph nodes within the gastrohepatic ligament (SUV max 9.8). In addition, there is extensive hypermetabolic activity throughout the peritoneal cavity associated with increased ascites and peritoneal nodularity, suspicious for carcinomatosis. Dependent hypermetabolic activity in the pelvis has an SUV max of 8.4. Hypermetabolic activity within the gastrocolic ligament has an  SUV max of 9.1. Incidental CT findings: Percutaneous jejunostomy tube remains in place. No evidence of bowel or ureteral obstruction. There are diverticular changes within the distal colon. Distended gallbladder, as before. As above, increasing ascites and peritoneal nodularity. SKELETON: There is no hypermetabolic activity to suggest osseous metastatic disease. Incidental CT findings: none IMPRESSION: 1. Intense hypermetabolic activity and associated wall thickening of the gastric cardia consistent  with known primary gastric malignancy. 2. 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 The peritoneal nodularity and hypermetabolic activity could be secondary to peritonitis in the appropriate clinical context although carcinomatosis favored based on previous imaging. Paracentesis performed 10/16/2022 demonstrated findings suspicious for malignancy on cytology. 3. New bilateral pleural effusions with associated dependent atelectasis in both lungs. Small foci of hypermetabolic activity in the chest as described, indeterminate for metastatic disease versus inflammation. 4.  Aortic Atherosclerosis (ICD10-I70.0). Electronically Signed   By: Carey Bullocks M.D.   On: 12/09/22 13:13    Procedures Procedures    Medications Ordered in ED Medications  metroNIDAZOLE (FLAGYL) IVPB 500 mg (has no administration in time range)  albumin human 25 % solution 25 g (25 g Intravenous New Bag/Given 09-Dec-2022 1645)  furosemide (LASIX) injection 60 mg (has no administration in time range)  ceFEPIme (MAXIPIME) 2 g in sodium chloride 0.9 % 100 mL IVPB (has no administration in time range)  vancomycin (VANCOREADY) IVPB 1250 mg/250 mL (has no administration in time range)  morphine (PF) 2 MG/ML injection 1 mg (has no administration in time range)  enoxaparin (LOVENOX) injection 40 mg (has no administration in time range)  sodium chloride  flush (NS) 0.9 % injection 3 mL (3 mLs Intravenous Given 12/09/2022 1530)  acetaminophen (TYLENOL) tablet 650 mg (has no administration in time range)    Or  acetaminophen (TYLENOL) suppository 650 mg (has no administration in time range)  ondansetron (ZOFRAN) tablet 4 mg (has no administration in time range)    Or  ondansetron (ZOFRAN) injection 4 mg (has no administration in time range)  Chlorhexidine Gluconate Cloth 2 % PADS 6 each (6 each Topical Given December 09, 2022 1630)  Oral care mouth rinse (has no administration in time range)  Oral care mouth rinse (has no administration in time range)  nystatin (MYCOSTATIN) 100000 UNIT/ML suspension 500,000 Units (500,000 Units Oral Patient Refused/Not Given 12-09-22 1830)  insulin aspart (novoLOG) injection 0-6 Units ( Subcutaneous Not Given 12/09/22 1756)  liver oil-zinc oxide (DESITIN) 40 % ointment ( Topical Given Dec 09, 2022 1830)  furosemide (LASIX) injection 40 mg (40 mg Intravenous Given 12/09/2022 1234)  ceFEPIme (MAXIPIME) 2 g in sodium chloride 0.9 % 100 mL IVPB (0 g Intravenous Stopped Dec 09, 2022 1327)  metroNIDAZOLE (FLAGYL) IVPB 500 mg (0 mg Intravenous Stopped December 09, 2022 1401)  vancomycin (VANCOREADY) IVPB 1500 mg/300 mL (1,500 mg Intravenous New Bag/Given 09-Dec-2022 1340)    ED Course/ Medical Decision Making/ A&P Clinical Course as of 09-Dec-2022 1841  Fri 12/09/22  1227 Discussed with rads, will read scan from yesterday [JD]    Clinical Course User Index [JD] Laurence Spates, MD                                 Medical Decision Making Amount and/or Complexity of Data Reviewed Labs: ordered. Radiology: ordered.  Risk Prescription drug management. Decision regarding hospitalization.   Medical Decision Making:   Steven Wolf is a 67 y.o. male who presented to the ED today with shortness of breath, abdominal distention, G-tube leakage.  He arrives tachycardic, tachypneic.  He has distended abdomen, though nontender.  On exam he appears grossly  volume overloaded.  EKG shows sinus tachycardia.  Performed a bedside ultrasound which showed good EF, no pericardial effusion.  However appears grossly volume overloaded.  I reviewed his PET scan from yesterday which shows right-sided  pleural effusion and large left-sided pleural effusion which appear new and I suspect this is causing his symptoms.  No appears to have some anasarca as well as ascites in his abdomen which is likely causing the distention.  He does not appear clinically obstructed.  I am concerned mostly for fluid retention leading to pleural effusion.  He does have significant tachycardia as well as leukocytosis although this appears chronic.  However he is immunosuppressed and will cover with antibiotics.  Given he appears volume overloaded will trial Lasix to see if this helps with his fluid retention.  Hold on fluids due to concern for volume overload.   Patient placed on continuous vitals and telemetry monitoring while in ED which was reviewed periodically.  Reviewed and confirmed nursing documentation for past medical history, family history, social history.  Reassessment and Plan:   Workup here notable for significant leukocytosis although stable.  CMP with baseline renal function.  COVID and flu are negative.  Lactic acid is normal.  Chest x-ray shows bilateral pleural effusions worse on the left, I was able to get his PET scan read which shows signs of possible spontaneous peritonitis for which he has been covered with antibiotics as well as bilateral pleural effusions.  I am concerned for both sepsis and volume overload.  Discussed with hospitalist and admitted.   Patient's presentation is most consistent with acute presentation with potential threat to life or bodily function.           Final Clinical Impression(s) / ED Diagnoses Final diagnoses:  SOB (shortness of breath)    Rx / DC Orders ED Discharge Orders     None         Laurence Spates,  MD Dec 23, 2022 [phone]

## 2022-11-29 NOTE — H&P (Signed)
Steven Mo, MD  glucose blood (ACCU-CHEK GUIDE) test strip Use to test blood sugar four times daily as directed 11/20/22   Pincus Sanes, MD  insulin aspart (NOVOLOG) 100 UNIT/ML FlexPen Inject  into the skin according to sliding scale: If CBG 121-150 inject 2 units,151-200 3 units,201-250 5 units,251-300 8 units,301-350 11 units,351-400 15 units,over 400 call MD 11/20/22   Pincus Sanes, MD  Insulin Pen Needle (TECHLITE PLUS PEN NEEDLES) 32G X 4 MM MISC Use to inject insulin as directed 11/20/22   Pincus Sanes, MD  Omeprazole-Sodium Bicarbonate 2-84 MG/ML SUSR Take 20 mLs by mouth daily. 11/07/22   [name], MD  ondansetron (ZOFRAN-ODT) 4 MG disintegrating tablet Dissolve 1-2 tablets (4-8 mg total) by mouth every 8 (eight) hours as needed for nausea or vomiting. 11/07/22   [name], MD  pantoprazole (PROTONIX) 40 MG injection Inject 40 mg into the vein daily. Patient not taking: Reported on 11/04/2022 11/03/22 12/03/22  Briant Cedar, MD  PRESCRIPTION MEDICATION CPAP- At bedtime as determined by the patient Patient not taking: Reported on 11/04/2022    [provider]    Physical Exam: Vitals:   12/01/2022 1107 12/03/2022 1215 12/15/2022 1230 12/09/2022 1345  BP: (!) 117/92 120/86 (!) 116/95 135/87  Pulse: (!) 138 (!) 133 (!) 133 (!) 139  Resp: (!) 24 20 (!) 25 (!) 23  Temp: 97.9  F (36.6 C)     TempSrc: Oral     SpO2: 97% 94% 94% 93%   Constitutional: NAD, calm, mildly uncomfortable secondary to ongoing dyspnea Respiratory: Posterior lung sounds mostly clear on the left noting patient has recently undergone bedside thoracentesis by pulmonary medicine.  Right lung sounds are with scattered crackles.  O2 at 2 L/min.  Patient remains tachypneic with respiratory rates in the mid to high 20s occasionally in the 30s.  No circumoral cyanosis Cardiovascular: Regular rate with significant tachycardia and rhythm, no murmurs / rubs / gallops.  Marked bilateral lower extremity edema 3+. 2+ pedal pulses.   Abdomen: no tenderness, no masses palpated. No hepatosplenomegaly. Bowel sounds positive.  Musculoskeletal: no clubbing / cyanosis. No joint deformity upper and lower extremities. Good ROM, no contractures. Normal muscle tone.  Skin: no rashes, lesions, ulcers.  Lower extremities are cool to the touch.  Patient has significant excoriation distal to and extending out about 6 to 8 cm from the J-tube insertion site Neurologic: CN 2-12 grossly intact. Sensation intact,  Strength 5/5 x all 4 extremities.  Psychiatric: Normal judgment and insight. Alert and oriented x 3. Normal mood.   Data Reviewed:  As per HPI  Assessment and Plan: Suspected sepsis Typical criteria may not be met in a patient undergoing immunotherapy Lactic acid is normal and patient has baseline chronic leukocytosis There was concern of possible peritonitis on PET scan therefore patient has been started on empiric broad-spectrum antibiotics with Maxipime, Flagyl and vancomycin dosed by pharmacy Follow-up on urinalysis, urine culture and blood cultures Check procalcitonin although may be elevated in setting of malignancy  Dyspnea secondary to bilateral pleural effusions Patient slightly improved after bedside diagnostic thoracentesis by pulmonary medicine.  1.7 L removed of clear bloody effluent Follow-up on  cytology pleural LDH, pleural protein-serum LDH and total protein also ordered Continue O2 Obtain follow-up chest x-ray PCR for COVID, RSV and influenza negative  Significant peripheral edema Likely related to profound hypoalbuminemia in context of severe protein calorie malnutrition Patient has been experiencing significant tachycardia for several weeks therefore he could have  History and Physical    Patient: Steven Wolf ZOX:[id number] DOB: 09-09-1955 DOA: 12/16/2022 DOS: the patient was seen and examined on 11/28/2022 PCP: Pincus Sanes, MD  Patient coming from: Home-lives with wife  Chief Complaint:  Chief Complaint  Patient presents with   Shortness of Breath   HPI: Steven Wolf is a 67 y.o. male with medical history significant of dyslipidemia, hypertension, BPH, diabetes mellitus 2, sleep apnea on CPAP.  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  He is also been found to have peritoneal metastasis.  He has been receiving his nutrition via TPN through Port-A-Cath.  He does have a J-tube in place but has been unable to tolerate any enteral feeds.  Patient presented to the hospital with complaints of shortness of breath, lower extremity edema, abdominal distention and persistent leaking around the J-tube.  He has not had any fevers or abdominal pain.  He reports shortness of breath for multiple weeks.  Lower extremity edema for a few days.  He has significant orthopnea.  If he walks to the bathroom in his home he has significant shortness of breath.  He has had chronic drainage from the insertion site of the J-tube and has become worse over the past 3 to 4 weeks and is now more purulent in appearance.  His last immunotherapy treatment with nivolumab and ipilimumab was 8 days ago.  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  He also was found to have new bilateral pleural effusions left greater than right.  Chest x-ray performed here in the ED also confirmed bilateral pleural effusions left greater than right.  At rest patient is quite tachypneic and does have difficulty speaking secondary to  shortness of breath.  He also remains tachycardic.  He was stable on room air at rest with sats dropping as low as 94%.  Since application of 2 L nasal cannula oxygen O2 sats increased to 98 to 99%.  Hospital service has been asked to evaluate the patient for admission.  In addition to the above patient was normotensive.  Patient has stable chronic leukocytosis with a white count 23,500.  Platelets are normal.  BNP normal.  Lactic acid normal.  Patient has been afebrile since arrival.  Given abnormal findings on PET scan and the fact that the patient is on immunotherapy for his cancer sepsis could not be excluded therefore patient has been given broad-spectrum antibiotics consisting of Maxipime, vancomycin and Flagyl.  Urinalysis, urine culture and blood cultures have been obtained.  Review of Systems: As mentioned in the history of present illness. All other systems reviewed and are negative. Past Medical History:  Diagnosis Date   Anxiety    Benign prostatic hypertrophy    Diverticulosis of colon    GERD (gastroesophageal reflux disease)    Sullivan Lone syndrome    History of kidney stones    Hypertension    Microscopic hematuria    Motion sickness    ocean boats, curvy roads   Nephrolithiasis     X 2; Dr Aldean Ast   Sleep apnea    USES CPAP   Syncope 02/2007   NOCTURNAL POST MICTURTION   Urticaria    Past Surgical History:  Procedure Laterality Date   APPENDECTOMY     BIOPSY  10/16/2022  History and Physical    Patient: Steven Wolf ZOX:[id number] DOB: 09-09-1955 DOA: 12/16/2022 DOS: the patient was seen and examined on 11/28/2022 PCP: Pincus Sanes, MD  Patient coming from: Home-lives with wife  Chief Complaint:  Chief Complaint  Patient presents with   Shortness of Breath   HPI: Steven Wolf is a 67 y.o. male with medical history significant of dyslipidemia, hypertension, BPH, diabetes mellitus 2, sleep apnea on CPAP.  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  He is also been found to have peritoneal metastasis.  He has been receiving his nutrition via TPN through Port-A-Cath.  He does have a J-tube in place but has been unable to tolerate any enteral feeds.  Patient presented to the hospital with complaints of shortness of breath, lower extremity edema, abdominal distention and persistent leaking around the J-tube.  He has not had any fevers or abdominal pain.  He reports shortness of breath for multiple weeks.  Lower extremity edema for a few days.  He has significant orthopnea.  If he walks to the bathroom in his home he has significant shortness of breath.  He has had chronic drainage from the insertion site of the J-tube and has become worse over the past 3 to 4 weeks and is now more purulent in appearance.  His last immunotherapy treatment with nivolumab and ipilimumab was 8 days ago.  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  He also was found to have new bilateral pleural effusions left greater than right.  Chest x-ray performed here in the ED also confirmed bilateral pleural effusions left greater than right.  At rest patient is quite tachypneic and does have difficulty speaking secondary to  shortness of breath.  He also remains tachycardic.  He was stable on room air at rest with sats dropping as low as 94%.  Since application of 2 L nasal cannula oxygen O2 sats increased to 98 to 99%.  Hospital service has been asked to evaluate the patient for admission.  In addition to the above patient was normotensive.  Patient has stable chronic leukocytosis with a white count 23,500.  Platelets are normal.  BNP normal.  Lactic acid normal.  Patient has been afebrile since arrival.  Given abnormal findings on PET scan and the fact that the patient is on immunotherapy for his cancer sepsis could not be excluded therefore patient has been given broad-spectrum antibiotics consisting of Maxipime, vancomycin and Flagyl.  Urinalysis, urine culture and blood cultures have been obtained.  Review of Systems: As mentioned in the history of present illness. All other systems reviewed and are negative. Past Medical History:  Diagnosis Date   Anxiety    Benign prostatic hypertrophy    Diverticulosis of colon    GERD (gastroesophageal reflux disease)    Sullivan Lone syndrome    History of kidney stones    Hypertension    Microscopic hematuria    Motion sickness    ocean boats, curvy roads   Nephrolithiasis     X 2; Dr Aldean Ast   Sleep apnea    USES CPAP   Syncope 02/2007   NOCTURNAL POST MICTURTION   Urticaria    Past Surgical History:  Procedure Laterality Date   APPENDECTOMY     BIOPSY  10/16/2022  History and Physical    Patient: Steven Wolf ZOX:[id number] DOB: 09-09-1955 DOA: 12/16/2022 DOS: the patient was seen and examined on 11/28/2022 PCP: Pincus Sanes, MD  Patient coming from: Home-lives with wife  Chief Complaint:  Chief Complaint  Patient presents with   Shortness of Breath   HPI: Steven Wolf is a 67 y.o. male with medical history significant of dyslipidemia, hypertension, BPH, diabetes mellitus 2, sleep apnea on CPAP.  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  He is also been found to have peritoneal metastasis.  He has been receiving his nutrition via TPN through Port-A-Cath.  He does have a J-tube in place but has been unable to tolerate any enteral feeds.  Patient presented to the hospital with complaints of shortness of breath, lower extremity edema, abdominal distention and persistent leaking around the J-tube.  He has not had any fevers or abdominal pain.  He reports shortness of breath for multiple weeks.  Lower extremity edema for a few days.  He has significant orthopnea.  If he walks to the bathroom in his home he has significant shortness of breath.  He has had chronic drainage from the insertion site of the J-tube and has become worse over the past 3 to 4 weeks and is now more purulent in appearance.  His last immunotherapy treatment with nivolumab and ipilimumab was 8 days ago.  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  He also was found to have new bilateral pleural effusions left greater than right.  Chest x-ray performed here in the ED also confirmed bilateral pleural effusions left greater than right.  At rest patient is quite tachypneic and does have difficulty speaking secondary to  shortness of breath.  He also remains tachycardic.  He was stable on room air at rest with sats dropping as low as 94%.  Since application of 2 L nasal cannula oxygen O2 sats increased to 98 to 99%.  Hospital service has been asked to evaluate the patient for admission.  In addition to the above patient was normotensive.  Patient has stable chronic leukocytosis with a white count 23,500.  Platelets are normal.  BNP normal.  Lactic acid normal.  Patient has been afebrile since arrival.  Given abnormal findings on PET scan and the fact that the patient is on immunotherapy for his cancer sepsis could not be excluded therefore patient has been given broad-spectrum antibiotics consisting of Maxipime, vancomycin and Flagyl.  Urinalysis, urine culture and blood cultures have been obtained.  Review of Systems: As mentioned in the history of present illness. All other systems reviewed and are negative. Past Medical History:  Diagnosis Date   Anxiety    Benign prostatic hypertrophy    Diverticulosis of colon    GERD (gastroesophageal reflux disease)    Sullivan Lone syndrome    History of kidney stones    Hypertension    Microscopic hematuria    Motion sickness    ocean boats, curvy roads   Nephrolithiasis     X 2; Dr Aldean Ast   Sleep apnea    USES CPAP   Syncope 02/2007   NOCTURNAL POST MICTURTION   Urticaria    Past Surgical History:  Procedure Laterality Date   APPENDECTOMY     BIOPSY  10/16/2022

## 2022-11-29 NOTE — Progress Notes (Signed)
Subjective: CC:  Known to our service. Last seen by the D[facility] service when in hospital on 8/10. Saw Dr. Donell Beers in the office on 8/30  Pt presented with new dx gastric cancer July 2024 after having dysphagia and weight loss. He was being set up for chemo. EUS showed diffuse cancer in stomach and linitis plastica.   He underwent port, dx lap, and J tube placement 10/16/2022. He was found to have peritoneal metastatic disease. He had a rough post op course and had trouble tolerating J Tube feeds. CT showed focal area of small bowel pneumatosis and some free air but no contrast extravasation. Feeds were held. Repeat scan was done to follow up and pneumatosis was resolved, but there was still free air. His abdominal exam was benign and WBCs were normal. Prior to d/c he started having some bilious drainage from his wound. Scan showed the J tube site to be just below the midline incision.  He was discharged to home on TPN with an Eakin's pouch on the fistula.Steven Wolf Dr. Donell Beers on 8/16 and 8/30. On 8/16 he was still having a scant amount of drainage in the Eakin's pouch. On 8/30 this had healed over but he was having some leaking around the J-tube and on exam he had some skin breakdown and irritation. Recommended for zinc oxide barrier cream around the tube. Outer dressing should be changed as needed.  Plan was to determine after the PET scan if we can begin utilizing the J-tube again to see if we can get him back up on tube feeds and get him off the TPN.  Dr. Donell Beers did not think that he could effectively have a surgical gastrojejunostomy given the fact that he had lienitis plastica. 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   He presented today, 9/6, with abdominal distention and persistent leaking around the J-tube that has become more purulent . No fever or abdominal pain. Also complains of sob and LE edema. His last immunotherapy treatment with nivolumab and ipilimumab was 8  days ago. No CT scan done here. 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  In the ED he was afebrile without hypotension but was noted to be tachycardic in the 120's - 130's. Lactic wnl. WBC 23.8 > 10.8. Admitted to [facility] and started on broad spectrum abx. We were asked to see.   Objective: Vital signs in last 24 hours: Temp:  [97.9 F (36.6 C)] 97.9 F (36.6 C) 12/21/2022 1107) Pulse Rate:  [127-139] 127 Dec 21, 2022 1600) Resp:  [16-25] 16 12/21/22 1600) BP: (111-135)/(83-95) 111/83 21-Dec-2022 1600) SpO2:  [93 %-99 %] 99 % 21-Dec-2022 1600)    Intake/Output from previous day: No intake/output data recorded. Intake/Output this shift: Total I/O In: 200 [IV Piggyback:200] Out: -   PE: Gen:  Alert, NAD, pleasant HEENT: EOM's intact, pupils equal and round Card:  RRR Pulm:  effort normal Abd: Soft, ND, tender around J tube site  where skin is excoriated with some purulent appearing drainage, incision with glue intact appears well and are without drainage or cellulitis  Ext:  No LE edema or calf tenderness Psych: A&Ox3  Skin: no rashes noted, warm and dry  Lab Results:  Recent Labs    12/14/2022 1130 12/21/2022 1530  WBC 23.5* 20.8*  HGB 10.5* 10.0*  HCT 32.8* 31.5*  PLT 346 307   BMET Recent Labs    11/28/22 1012 12/22/2022 1130   NA 137 137  K 4.6 4.3  CL 103 102  CO2 27 25  GLUCOSE 123* 122*  BUN 39* 41*  CREATININE 0.63 0.62  CALCIUM 9.2 8.7*   PT/INR Recent Labs    12/01/2022 1130  LABPROT 15.3*  INR 1.2   CMP     Component Value Date/Time   NA 137 11/25/2022 1130   K 4.3 11/25/2022 1130   CL 102 12/19/2022 1130   CO2 25 12/15/2022 1130   GLUCOSE 122 (H) 12/09/2022 1130   BUN 41 (H) 11/28/2022 1130   CREATININE 0.62 12/16/2022 1130   CREATININE 0.63 11/28/2022 1012   CREATININE 1.20 12/13/2019 1219   CALCIUM 8.7 (L) 12/15/2022 1130   PROT 6.2 (L) 12/06/2022 1130   ALBUMIN 2.0 (L) 12/13/2022 1130   AST 27 12/16/2022 1130   AST 20 11/28/2022 1012   ALT 18 12/08/2022 1130   ALT 13 11/28/2022 1012   ALKPHOS 119 12/20/2022 1130   BILITOT 1.7 (H) 12/22/2022 1130   BILITOT 1.4 (H) 11/28/2022 1012   GFRNONAA >60 12/21/2022 1130   GFRNONAA >60 11/28/2022 1012   GFRNONAA 63 12/13/2019 1219   GFRAA 73 12/13/2019 1219   Lipase  No results found for: "LIPASE"  Studies/Results: DG CHEST PORT 1 VIEW  Result Date: 12/22/2022 CLINICAL DATA:  Shortness of breath with abdominal distension EXAM: PORTABLE CHEST 1 VIEW COMPARISON:  11/18/2022, CT 10/11/2022, PET CT 11/28/2022 FINDINGS: Left-sided central venous port tip over the cavoatrial region. Possible small right effusion with hazy right infrahilar atelectasis. Moderate left pleural effusion with possible loculation. Airspace disease at left base. IMPRESSION: 1. Moderate left pleural effusion with possible loculation. Airspace disease at left base may reflect atelectasis or pneumonia. 2. Possible small right effusion with right infrahilar atelectasis. Electronically Signed   By: Jasmine Pang M.D.   On: 12/06/2022 15:15   NM PET Image Initial (PI) Skull Base To Thigh  Result Date: 12/05/2022 CLINICAL DATA:  Initial treatment strategy for gastric adenocarcinoma diagnosed last month. EXAM: NUCLEAR MEDICINE PET SKULL BASE TO THIGH TECHNIQUE: 8.78 mCi F-18  FDG was injected intravenously. Full-ring PET imaging was performed from the skull base to thigh after the radiotracer. CT data was obtained and used for attenuation correction and anatomic localization. Fasting blood glucose: 116 mg/dl COMPARISON:  Abdominopelvic CT 10/28/2022 and 10/23/2022. Chest CT 10/11/2022. FINDINGS: Mediastinal blood pool activity: SUV max 1.7 NECK: No hypermetabolic cervical lymph nodes are identified. No suspicious activity identified within the pharyngeal mucosal space. Incidental CT findings: none CHEST: Interval development of moderate left and small right pleural effusions with associated dependent atelectasis in both lungs. Anteriorly in the right upper lobe, there is a new hypermetabolic subpleural nodule measuring approximately 1.0 x 0.8 cm on image 26/7 (SUV max 3.4). This may be inflammatory as there was no abnormality in this area on CT performed just 6 weeks ago. No other hypermetabolic pulmonary activity or suspicious nodularity. 1.0 cm subcarinal node on image 83/4 is mildly hypermetabolic (SUV max 4.1). No other hypermetabolic  mediastinal, hilar or axillary lymph nodes are identified. Possible small left internal mammary or anterior pleural-based hypermetabolic nodule (SUV max 3.2). Incidental CT findings: As above, new bilateral pleural effusions with associated compressive atelectasis in both lungs. Left subclavian Port-A-Cath extends to the mid SVC. Mild aortic atherosclerosis. ABDOMEN/PELVIS: There is no hypermetabolic activity within the liver, adrenal glands, spleen or pancreas. There is intense hypermetabolic activity and associated wall thickening of the gastric cardia (SUV max 14.6). There are poorly defined hypermetabolic lymph nodes within the gastrohepatic ligament (SUV max 9.8). In addition, there is extensive hypermetabolic activity throughout the peritoneal cavity associated with increased ascites and peritoneal nodularity, suspicious for carcinomatosis.  Dependent hypermetabolic activity in the pelvis has an SUV max of 8.4. Hypermetabolic activity within the gastrocolic ligament has an SUV max of 9.1. Incidental CT findings: Percutaneous jejunostomy tube remains in place. No evidence of bowel or ureteral obstruction. There are diverticular changes within the distal colon. Distended gallbladder, as before. As above, increasing ascites and peritoneal nodularity. SKELETON: There is no hypermetabolic activity to suggest osseous metastatic disease. Incidental CT findings: none IMPRESSION: 1. Intense hypermetabolic activity and associated wall thickening of the gastric cardia consistent with known primary gastric malignancy. 2. 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 The peritoneal nodularity and hypermetabolic activity could be secondary to peritonitis in the appropriate clinical context although carcinomatosis favored based on previous imaging. Paracentesis performed 10/16/2022 demonstrated findings suspicious for malignancy on cytology. 3. New bilateral pleural effusions with associated dependent atelectasis in both lungs. Small foci of hypermetabolic activity in the chest as described, indeterminate for metastatic disease versus inflammation. 4.  Aortic Atherosclerosis (ICD10-I70.0). Electronically Signed   By: Carey Bullocks M.D.   On: 12/04/22 13:13    Anti-infectives: Anti-infectives (From admission, onward)    Start     Dose/Rate Route Frequency Ordered Stop   12-04-22 2200  metroNIDAZOLE (FLAGYL) IVPB 500 mg        500 mg 100 mL/hr over 60 Minutes Intravenous Every 12 hours 2022/12/04 1404     2022-12-04 2200  ceFEPIme (MAXIPIME) 2 g in sodium chloride 0.9 % 100 mL IVPB        2 g 200 mL/hr over 30 Minutes Intravenous Every 8 hours December 04, 2022 1418     04-Dec-2022 2200  vancomycin (VANCOREADY) IVPB 1250 mg/250 mL        1,250 mg 166.7 mL/hr over 90 Minutes  Intravenous Every 12 hours 12/04/2022 1418     2022-12-04 1230  ceFEPIme (MAXIPIME) 2 g in sodium chloride 0.9 % 100 mL IVPB        2 g 200 mL/hr over 30 Minutes Intravenous  Once 12-04-2022 1224 12/04/2022 1327   December 04, 2022 1230  metroNIDAZOLE (FLAGYL) IVPB 500 mg        500 mg 100 mL/hr over 60 Minutes Intravenous  Once 12-04-22 1224 12-04-2022 1401   04-Dec-2022 1230  vancomycin (VANCOCIN) IVPB 1000 mg/200 mL premix  Status:  Discontinued        1,000 mg 200 mL/hr over 60 Minutes Intravenous  Once 12-04-22 1224 2022/12/04 1228   Dec 04, 2022 1230  vancomycin (VANCOREADY) IVPB 1500 mg/300 mL        1,500 mg 150 mL/hr over 120 Minutes Intravenous  Once 12-04-22 1228 12-04-2022 1540        Assessment/Plan 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 He is currently on immunotherapy and followed  by Dr.Feng. Would recommend Oncology consult for recommendations. Do not recommend emergency surgery at this time. He has previously been seen by Palliative. Consider palliative conversation pending Oncology recs for further GOC conversations.  - Increased ascites noted on PET scan. Currently on broad spectrum abx. Consider IR consult for paracentesis to ensure this malignant ascites and not SBP. Do not recommend emergency surgery for this.  - J-tube leakage noted. Continue zinc oxide barrier cream around the tube. Can also consider trying ostomy pouch around J-tube to try to control leakage better. WOCN consult for any additional recommendations. As per Dr. Arita Miss last office visit - she did not think that he could effectively have a surgical gastrojejunostomy given the fact that he had lienitis plastica. 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  - if pursuing more comfort focused measures at this time then very reasonable to remove j-tube - need to establish GOC better   FEN - NPO, nothing per J tube.  VTE - SCDs, okay for chem  ppx ID - on Cefepime/Flagyl/Vanc  I reviewed ED provider notes, hospitalist notes, last 24 h vitals and pain scores, last 48 h intake and output, last 24 h labs and trends, last 24 h imaging results, and last several surgical notes    LOS: 0 days     Trixie Deis, [facility] 2022/12/02, 4:34 PM Please see Amion for pager number during day hours 7:00am-4:30pm

## 2022-11-29 NOTE — Sepsis Progress Note (Signed)
eLink is following this Code Sepsis. °

## 2022-11-30 ENCOUNTER — Inpatient Hospital Stay (HOSPITAL_COMMUNITY): Payer: Medicare PPO

## 2022-11-30 DIAGNOSIS — R000 Tachycardia, unspecified: Secondary | ICD-10-CM

## 2022-11-30 DIAGNOSIS — J90 Pleural effusion, not elsewhere classified: Secondary | ICD-10-CM | POA: Diagnosis not present

## 2022-11-30 DIAGNOSIS — Z515 Encounter for palliative care: Secondary | ICD-10-CM | POA: Diagnosis not present

## 2022-11-30 DIAGNOSIS — R0600 Dyspnea, unspecified: Secondary | ICD-10-CM

## 2022-11-30 DIAGNOSIS — Z7189 Other specified counseling: Secondary | ICD-10-CM | POA: Diagnosis not present

## 2022-11-30 DIAGNOSIS — R0602 Shortness of breath: Secondary | ICD-10-CM | POA: Diagnosis not present

## 2022-11-30 DIAGNOSIS — R0609 Other forms of dyspnea: Secondary | ICD-10-CM

## 2022-11-30 DIAGNOSIS — M7989 Other specified soft tissue disorders: Secondary | ICD-10-CM

## 2022-11-30 DIAGNOSIS — A419 Sepsis, unspecified organism: Secondary | ICD-10-CM | POA: Diagnosis not present

## 2022-11-30 DIAGNOSIS — R531 Weakness: Secondary | ICD-10-CM

## 2022-11-30 LAB — LACTATE DEHYDROGENASE, PLEURAL OR PERITONEAL FLUID
LD, Fluid: 123 U/L — ABNORMAL HIGH (ref 3–23)
LD, Fluid: 185 U/L — ABNORMAL HIGH (ref 3–23)
LD, Fluid: 118 U/L — ABNORMAL HIGH (ref 3–23)

## 2022-11-30 LAB — BODY FLUID CELL COUNT WITH DIFFERENTIAL
Lymphs, Fluid: 27 %
Lymphs, Fluid: 37 %
Monocyte-Macrophage-Serous Fluid: 65 % (ref 50–90)
Monocyte-Macrophage-Serous Fluid: 45 % — ABNORMAL LOW (ref 50–90)
Neutrophil Count, Fluid: 8 % (ref 0–25)
Neutrophil Count, Fluid: 18 % (ref 0–25)
Total Nucleated Cell Count, Fluid: 405 uL (ref 0–1000)
Total Nucleated Cell Count, Fluid: 237 uL (ref 0–1000)

## 2022-11-30 LAB — URINALYSIS, W/ REFLEX TO CULTURE (INFECTION SUSPECTED)
Bacteria, UA: NONE SEEN
Bilirubin Urine: NEGATIVE
Glucose, UA: NEGATIVE mg/dL
Hgb urine dipstick: NEGATIVE
Ketones, ur: NEGATIVE mg/dL
Leukocytes,Ua: NEGATIVE
Nitrite: NEGATIVE
Protein, ur: NEGATIVE mg/dL
Specific Gravity, Urine: 1.006 (ref 1.005–1.030)
pH: 5 (ref 5.0–8.0)

## 2022-11-30 LAB — PROCALCITONIN: Procalcitonin: 0.66 ng/mL

## 2022-11-30 LAB — GLUCOSE, CAPILLARY
Glucose-Capillary: 153 mg/dL — ABNORMAL HIGH (ref 70–99)
Glucose-Capillary: 177 mg/dL — ABNORMAL HIGH (ref 70–99)
Glucose-Capillary: 88 mg/dL (ref 70–99)
Glucose-Capillary: 93 mg/dL (ref 70–99)
Glucose-Capillary: 95 mg/dL (ref 70–99)

## 2022-11-30 LAB — CBC
HCT: 28.2 % — ABNORMAL LOW (ref 39.0–52.0)
Hemoglobin: 9.1 g/dL — ABNORMAL LOW (ref 13.0–17.0)
MCH: 28.4 pg (ref 26.0–34.0)
MCHC: 32.3 g/dL (ref 30.0–36.0)
MCV: 88.1 fL (ref 80.0–100.0)
Platelets: 256 10*3/uL (ref 150–400)
RBC: 3.2 MIL/uL — ABNORMAL LOW (ref 4.22–5.81)
RDW: 14.1 % (ref 11.5–15.5)
WBC: 18.1 10*3/uL — ABNORMAL HIGH (ref 4.0–10.5)
nRBC: 0 % (ref 0.0–0.2)

## 2022-11-30 LAB — COMPREHENSIVE METABOLIC PANEL
ALT: 18 U/L (ref 0–44)
AST: 26 U/L (ref 15–41)
Albumin: 3 g/dL — ABNORMAL LOW (ref 3.5–5.0)
Alkaline Phosphatase: 111 U/L (ref 38–126)
Anion gap: 14 (ref 5–15)
BUN: 40 mg/dL — ABNORMAL HIGH (ref 8–23)
CO2: 24 mmol/L (ref 22–32)
Calcium: 9 mg/dL (ref 8.9–10.3)
Chloride: 100 mmol/L (ref 98–111)
Creatinine, Ser: 0.69 mg/dL (ref 0.61–1.24)
GFR, Estimated: >60 mL/min (ref >60)
Glucose, Bld: 103 mg/dL — ABNORMAL HIGH (ref 70–99)
Potassium: 3.2 mmol/L — ABNORMAL LOW (ref 3.5–5.1)
Sodium: 138 mmol/L (ref 135–145)
Total Bilirubin: 2.8 mg/dL — ABNORMAL HIGH (ref 0.3–1.2)
Total Protein: 6.2 g/dL — ABNORMAL LOW (ref 6.5–8.1)

## 2022-11-30 LAB — ECHOCARDIOGRAM COMPLETE
AR max vel: 2.46 cm2
AV Area VTI: 2.47 cm2
AV Area mean vel: 2.38 cm2
AV Mean grad: 3 mmHg
AV Peak grad: 5.5 mmHg
Ao pk vel: 1.17 m/s
Height: 72 in
S' Lateral: 2.5 cm
Weight: 2709.01 [oz_av]

## 2022-11-30 LAB — ALBUMIN, PLEURAL OR PERITONEAL FLUID: Albumin, Fluid: <1.5 g/dL

## 2022-11-30 LAB — GLUCOSE, PLEURAL OR PERITONEAL FLUID
Glucose, Fluid: 120 mg/dL
Glucose, Fluid: 51 mg/dL
Glucose, Fluid: <20 mg/dL

## 2022-11-30 LAB — PROTEIN, PLEURAL OR PERITONEAL FLUID
Total protein, fluid: <3 g/dL
Total protein, fluid: 3.1 g/dL
Total protein, fluid: 3.2 g/dL

## 2022-11-30 LAB — LAB REPORT - SCANNED: EGFR: 109

## 2022-11-30 LAB — MAGNESIUM: Magnesium: 1.9 mg/dL (ref 1.7–2.4)

## 2022-11-30 LAB — PHOSPHORUS: Phosphorus: 6.5 mg/dL — ABNORMAL HIGH (ref 2.5–4.6)

## 2022-11-30 MED ORDER — LIDOCAINE HCL 1 % IJ SOLN
INTRAMUSCULAR | Status: AC
  Filled 2022-11-30: qty 20

## 2022-11-30 MED ORDER — FUROSEMIDE 10 MG/ML IJ SOLN
60.0000 mg | Freq: Once | INTRAMUSCULAR | Status: AC
  Administered 2022-11-30: 60 mg via INTRAVENOUS
  Filled 2022-11-30: qty 6

## 2022-11-30 MED ORDER — TRAVASOL 10 % IV SOLN
INTRAVENOUS | Status: AC
  Filled 2022-11-30: qty 1155

## 2022-11-30 MED ORDER — POTASSIUM CHLORIDE 10 MEQ/50ML IV SOLN
10.0000 meq | INTRAVENOUS | Status: AC
  Administered 2022-11-30 (×4): 10 meq via INTRAVENOUS
  Filled 2022-11-30 (×6): qty 50

## 2022-11-30 NOTE — Hospital Course (Addendum)
The patient is a 67 year old chronically ill-appearing Caucasian male with a past medical history significant for but not limited to dyslipidemia, hypertension, BPH, diabetes mellitus type 2, struct of sleep apnea on a CPAP fluids recently diagnosed with stage IV adenocarcinoma of the stomach earlier this year with peritoneal metastasis. Subsequently he has had multiple hospitalizations and he is now receiving nutrition via TPN to a Port-A-Cath. He does have a J-tube in place but was unable to tolerate any enteral feeds and is likely a venting J-tube. He presented to the hospital with acute worsening shortness of breath that is going on for several weeks but worsening in the last few days with associated worsening lower extremity edema over the few days. He continues to have significant orthopnea and states that whenever he walks to the bathroom at home he gets significant short of breath. He underwent a total body PET scan on 11/28/2022 which showed new findings of hypermetabolic nodule metastasis and probably progressive peritoneal carcinomatosis along with increased ascites although peritonitis could have similar finding. He was also noted to have bilateral new pleural effusions which was worse on the left compared to right. Chest x-ray also confirmed these and he was admitted for his shortness of breath and is currently being worked up. Pulmonary has been consulted for his pleural effusions and patient underwent a thoracentesis that has been sent for analysis.   Patient is likely third spacing from his hypoalbuminemia. General surgery was also consulted given his J tube excoriation and drainage and they are recommending possibly trying an ostomy pouch around the J-tube to control the leakage better. WOC nurse has been consulted for additional recommendations. Surgery does not feel that he needs surgical intervention at this time and recommends further goals of care and palliative goals of care discussion.  Surgery feels that if he focuses more on comfort focused measures and is reasonable to remove the J-tube   Care has been consulted and introduced the hospice philosophy but patient will like to speak to his primary oncologist prior to making further decisions.  We have ordered a paracentesis today and pulmonary has now done a thoracentesis on the right side and will continue diuresis with Lasix.  Assessment and Plan:  Acute dyspnea in the setting of bilateral pleural effusions in the setting of volume overload from hypoalbuminemia will need to rule out heart failure -Repeat chest x-ray after his thoracentesis and he underwent thoracentesis on the left which yielded 1.7 L yesterday and he underwent a thoracentesis on the right today which removed 900 cc of dark amber appearing fluid -Will need to follow-up on the cytology sent off on his pleural studies -His PCR was negative for COVID RSV and influenza -Checking echocardiogram -Initiating diuresis along with albumin and given Lasix again this morning with 60 mg -Repeat chest x-ray this afternoon still pending after his thoracentesis -Will add Xopenex, Atrovent, and add flutter valve, incentive spirometry -Repeat chest x-ray in the a.m. -Appreciate pulmonary recommendations the patient underwent a thoracentesis on the left and right and will need to monitor respiratory status carefully  Significant peripheral edema -Likely related to his hypoalbuminemia in the setting of severe protein calorie malnutrition -He has been tachycardic for several weeks and possibly could have induced tachycardic cardiomyopathy but he also has been getting immunotherapy -Echocardiogram pending to be done -Getting albumin 25 g every 6 hours x 4 today and given a dose of IV Lasix 60 mg this morning and will reconsider giving another dose this evening -Will check  lower extremity duplex to rule out DVT   Leukocytosis -Suspect this is chronic but will need to rule out  infection -Continue with empiric antibiotics for now Recent Labs  Lab 11/13/22 0829 11/18/22 1203 11/21/22 1159 11/28/22 1012 12/13/2022 1130 12/23/2022 1530 11/30/22 0500  WBC 18.8* 20.5* 24.3* 23.3* 23.5* 20.8* 18.1*  -Follow-up blood cultures, urinalysis, urine culture and repeat chest x-ray in a.m. and may need to CT scan of the chest however he just had recent PET scan   Normocytic Anemia/Anemia of chronic disease -Hgb/Hct Trend: Recent Labs  Lab 11/13/22 0829 11/18/22 1203 11/21/22 1159 11/28/22 1012 12/19/2022 1130 11/25/2022 1530 11/30/22 0500  HGB 12.6* 12.0* 11.8* 10.6* 10.5* 10.0* 9.1*  HCT 38.2* 36.2* 34.5* 31.8* 32.8* 31.5* 28.2*  MCV 90.5 88.7 88.2 87.8 90.4 90.3 88.1  -Check anemia panel in the a.m. and continue to monitor for signs and symptoms bleeding; no overt bleeding noted To repeat CBC in a.m.   Suspected sepsis with likely in the setting of his metastatic and possibly in the setting of peritonitis versus a lung etiology with underlying concomitant adenocarcinoma and gastric outlet obstruction -Placed on broad-spectrum antibiotics for possible concern of peritonitis -Follow blood cultures, urine culture and repeat chest x-ray -Lactic acid level was normal -Get Paracentesis and send for Fluid Analysis as yielded 1.7 L of clear yellow fluid from the right lateral abdomen however during the procedure patient had a leaking J-tube which appeared to be bilious and dark green that was inconsistent with typical appearance of ascitic fluid however it appeared that this was gastric contents from around his tract and the interventional radiology team recommended reducing gauze underneath the bumper and change -Patient was tachypneic, tachycardic and had a leukocytosis on admission -Procalcitonin level was 0.66 and WBCs are trending down -Tachypnea has improved but he continues to be extremely tachycardic  Hypokalemia -Patient's K+ Level Trend: Recent Labs  Lab  11/07/22 0737 11/13/22 0829 11/18/22 1203 11/21/22 1159 11/28/22 1012 12/10/2022 1130 11/30/22 0500  K 4.5 4.5 4.6 4.5 4.6 4.3 3.2*  -Replete with TPN -Continue to Monitor and Replete as Necessary -Repeat CMP in the AM   Stage IV adenocarcinoma with peritoneal metastatic disease and gastric outlet obstruction -He is nutrition through TPN and will continue for now and this has not been initiated -Surgery consulted given his leaking around his J-tube and recommending that if he goes to comfort measures to remove the J-tube  -Have notified his primary oncologist Dr. Mosetta Putt via epic as a courtesy -After the patient got to the floor he desired to discuss palliative options to pursue possible comfort measures to have a better quality of life so palliative care has been consulted for further goals of care discussion -His last dose of immunotherapy was about 8 days ago -He is now DO NOT RESUSCITATE and will need further goals of care discussion -Palliative care evaluated and patient believes he does not have adequate quality life and inquired about comfort focused care and discuss hospice philosophy.  Patient wants to continue current mode of care at this time but wants to discuss further with Dr. Mosetta Putt prior to making any decision about transitioning to hospice; see if Dr. 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 -Does have a J-tube in place but has not been able to tolerate enteral feedings -Continues to have significant leakage around J-tube and this is causing a large area of excoriation on the abdomen -wound care nurse  consulted Surgery consulted to see if at this time it is appropriate to remove J-tube IV morphine 1 mg every 2 hours as needed ordered for pain -Surgery feels that they would recommend continuing local wound care and consulting oncology and palliative for symptom management and they feel that he is not a  surgical candidate at this time and feels that if he pursues more comfort focused measures that is very reasonable to remove the J-tube but recommending considering an ostomy pouch around the J-tube to try and control the leakage better   Oral Thrush -Based on home medication list patient had been started on Diflucan suspension by mouth but unclear if patient able to tolerate -We will switch to swish and swallow nystatin   Diabetes Mellitus Type 2 -Check CBGs every 4 hours with very sensitive sliding scale insulin -CBG Trend: Recent Labs  Lab 11/07/22 0737 11/13/22 0829 11/18/22 1203 11/21/22 1159 11/28/22 1012 12/08/2022 1130 11/30/22 0500  GLUCOSE 88 116* 140* 136* 123* 122* 103*  -CBG Trend: Recent Labs  Lab 11/28/22 1058 2022/12/08 1737 12-08-2022 1928 08-Dec-2022 2324 11/30/22 0454 11/30/22 0757 11/30/22 1154  GLUCAP 116* 99 101* 90 88 93 95    Severe protein calorie malnutrition with associated Hypoalbuminemia Nutrition Status: Nutrition Problem: Inadequate oral intake 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, fistula) Signs/Symptoms: NPO status Interventions: Refer to RD note for recommendations, TPN -Patient's Albumin Trend: Recent Labs  Lab 11/07/22 0737 11/13/22 0829 11/18/22 1203 11/21/22 1159 11/28/22 1012 Dec 08, 2022 1130 11/30/22 0500  ALBUMIN 3.0* 2.8* 2.7* 2.7* 2.8* 2.0* 3.0*  -Continue to Monitor and Trend and repeat CMP in the AM -Given lack of ability to tolerate enteral feedings it is difficult to maintain appropriate serum protein levels with TPN alone   End-of-life care -Now that the patient and wife have decided on a DNR status patient and family may benefit from a formal palliative medicine consult to help facilitate end-of-life issues and this was done and hospice.  Fever has been introduced but patient would like to speak with his primary oncologist -Prognosis appears poor

## 2022-11-30 NOTE — Progress Notes (Signed)
Initial Nutrition Assessment  DOCUMENTATION CODES:   Not applicable, history of severe malnutrition which likely persists but unable to confirm without NFPE  INTERVENTION:   - TPN management per Pharmacy to meet 100% of estimated nutrition needs  NUTRITION DIAGNOSIS:   Inadequate oral intake related to altered GI function (gastric outlet obstruction, fistula) as evidenced by NPO status.  GOAL:   Patient will meet greater than or equal to 90% of their needs  MONITOR:   Labs, Weight trends, Skin, I & O's, Other (TPN regimen, GOC)  REASON FOR ASSESSMENT:   Consult New TPN/TNA  ASSESSMENT:   67 year old male who presented to the ED on 28-Dec-2022 with SOB, abdominal distention, and leaking J-tube. 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 Pt admitted with large L pleural effusion, small R pleural effusion, and concern for sepsis.  12/28/2022 - s/p thoracentesis with 1700 ml fluid drained from L pleural space  RD working remotely. Received consult for TPN. Unable to reach pt via phone call at this time.  Pt is currently on immunotherapy and followed by Dr. Mosetta Putt. Pt has been followed closely [facility].  Per Surgery notes, pt is not a candidate for surgical intervention at this time. Surgery has recommended local wound care for pt's J-tube and to consult Palliative Medicine Team for symptom management. Pt's most recent PET scan shows progression on his cancer. Palliative Medicine team has been consulted; reason for consult reads "patient requests discussion of comfort care."  Reviewed RD notes from recent previous admission as well as [facility] notes. Pt did not tolerate enteral nutrition via J-tube during previous admission and was diagnosed with EC fistula and gastric  outlet obstruction. Pt also diagnosed with severe malnutrition by RD during previous admission. Suspect severe malnutrition persists, but RD unable to confirm without updated NFPE.  Pt discharged home on TPN after recent admission. Pt with ongoing issues with leaking J-tube, which he has not been using for feeding. Pt has been taking sips of water by mouth.  Reviewed weight history in chart. Pt's weight has fluctuated between 72-85.5 kg since April 2024. Suspect weight fluctuates are due in part to volume status and fluid shifts. Overall, pt has experienced an 8.7 kg weight loss since 07/04/22. This is a 10.2% weight loss in less than 5 months which is severe and significant for timeframe. Per nursing edema assessment, pt with mild pitting generalized edema and moderate pitting edema to BLE. Unsure of pt's current dry weight. Pt's weight loss may be even more severe since dry weight is likely less than current measured weight.  Medications reviewed and include: SSI every 4 hours, nystatin suspension 5 mL QID, IV albumin 25 grams x 4, IV abx  Labs reviewed: potassium 3.2, BUN 40, phosphorus 6.5, WBC 18.1, hemoglobin 9.1 CBG's: 88-101  UOP: 2900 ml x 24 hours I/O's: -1.7 L since admit  NUTRITION - FOCUSED PHYSICAL EXAM:  Unable to complete. RD working remotely.  Diet Order:   Diet Order             Diet NPO time specified  Diet effective now                   EDUCATION NEEDS:   Not appropriate for education at this time  Skin:  Skin Assessment: Skin Integrity  Issues: Other: MASD around J-tube site due to persistent leaking  Last BM:  no documented BM  Height:   Ht Readings from Last 1 Encounters:  12/03/2022 6' (1.829 m)    Weight:   Wt Readings from Last 1 Encounters:  11/30/22 76.8 kg    Ideal Body Weight:  80.9 kg  BMI:  Body mass index is 22.96 kg/m.  Estimated Nutritional Needs:   Kcal:  2400-2600  Protein:  110-130 grams  Fluid:  >2.4 L    Mertie Clause, MS, RD, LDN Registered Dietitian II Please see AMiON for contact information.

## 2022-11-30 NOTE — Procedures (Signed)
PROCEDURE SUMMARY:  Successful US guided paracentesis from right lateral abdomen.  Yielded 1.7 liters of clear, yellow fluid.  No immediate complications.  Pt tolerated well.   Specimen was sent for labs.  EBL < 5mL  Patient with leaking J-tube during procedure.  Output from around tract appears bilious/dark green and is inconsistent with appearance of ascites fluid.  Hopeful reduced ascites burden will decrease J-tube leakage, however appears this is gastric contents from around the tract.  Recommended RN reduce gauze underneath bumper and cinch.   Hoyt Koch PA-C 11/30/2022 12:55 PM

## 2022-11-30 NOTE — Progress Notes (Signed)
NAME:  Steven Wolf, MRN:  [id number], DOB:  Feb 25, 1956, LOS: 1 ADMISSION DATE:  20-Dec-2022, CONSULTATION DATE: 12/20/2022 REFERRING MD: Dr. 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, peritoneal metastasis came in with worsening shortness of breath, leg edema   Asked to see for pleural effusions  Pertinent  Medical History   Past Medical History:  Diagnosis Date   Anxiety    Benign prostatic hypertrophy    Diverticulosis of colon    GERD (gastroesophageal reflux disease)    Sullivan Lone syndrome    History of kidney stones    Hypertension    Microscopic hematuria    Motion sickness    ocean boats, curvy roads   Nephrolithiasis     X 2; Dr Aldean Ast   Sleep apnea    USES CPAP   Syncope 02/2007   NOCTURNAL POST MICTURTION   Urticaria      Significant Hospital Events: Including procedures, antibiotic start and stop dates in addition to other pertinent events   X-ray with bilateral pleural effusion, will left worse than right  Interim History / Subjective:  Postthoracentesis for 1.7 L of fluid Breathing feels a little bit better  Objective   Blood pressure 117/79, pulse (!) 124, temperature 98 F (36.7 C), temperature source Oral, resp. rate 14, height 6' (1.829 m), weight 76.8 kg, SpO2 93%.        Intake/Output Summary (Last 24 hours) at 11/30/2022 1040 Last data filed at 11/30/2022 0308 Gross per 24 hour  Intake 1178.27 ml  Output 2900 ml  Net -1721.73 ml   Filed Weights   12-20-22 1600 11/30/22 0457  Weight: 79.1 kg 76.8 kg    Examination: General: Elderly, chronically ill-appearing HENT: Moist oral mucosa Lungs: Decreased air entry bilaterally with rales at the bases Cardiovascular: S1-S2 appreciated Abdomen: Full, bowel sounds appreciated Extremities: Warm and dry Neuro: Alert and oriented x 3 GU:   I reviewed nursing notes, last 24 h vitals and  pain scores, last 48 h intake and output, last 24 h labs and trends, and last 24 h imaging results.  Resolved Hospital Problem list     Assessment & Plan:   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 Possible infection around his J-tube Hypoalbuminemia Third spacing Bilateral pleural effusion s/p left thoracentesis yielding an exudative fluid by LDH criteria Cytology pending  Plan for right-sided thoracentesis  Arrangements being made for paracentesis  Continue empiric antibiotics  Will continue to follow  Virl Diamond, MD [facility] Pager: See Loretha Stapler

## 2022-11-30 NOTE — Procedures (Signed)
Thoracentesis  Procedure Note  KINGDON LENZINI  [id number]  1955-11-22  Date:11/30/22  Time:3:32 PM   Provider Performing:Jennefer Kopp A Anden Bartolo   Procedure: Thoracentesis with imaging guidance (40981)  Indication(s) Pleural Effusion  Consent Risks of the procedure as well as the alternatives and risks of each were explained to the patient and/or caregiver.  Consent for the procedure was obtained and is signed in the bedside chart  Anesthesia Topical only with 1% lidocaine    Time Out Verified patient identification, verified procedure, site/side was marked, verified correct patient position, special equipment/implants available, medications/allergies/relevant history reviewed, required imaging and test results available.   Sterile Technique Maximal sterile technique including full sterile barrier drape, hand hygiene, sterile gown, sterile gloves, mask, hair covering, sterile ultrasound probe cover (if used).  Procedure Description Ultrasound was used to identify appropriate pleural anatomy for placement and overlying skin marked.  Area of drainage cleaned and draped in sterile fashion. Lidocaine was used to anesthetize the skin and subcutaneous tissue.  900 cc's of dark amber appearing fluid was drained from the right pleural space. Catheter then removed and bandaid applied to site.   Complications/Tolerance None; patient tolerated the procedure well. Chest X-ray is ordered to confirm no post-procedural complication.   EBL Minimal   Specimen(s) Pleural fluid fluid sent for analysis

## 2022-11-30 NOTE — Progress Notes (Addendum)
Studies: US Paracentesis  Result Date: 11/30/2022 INDICATION: Patient with metastatic gastric cancer, new ascites. Request for paracentesis. EXAM: ULTRASOUND GUIDED DIAGNOSTIC AND THERAPEUTIC PARACENTESIS MEDICATIONS: 10 mL 1% lidocaine COMPLICATIONS:  None immediate. PROCEDURE: Informed written consent was obtained from the patient after a discussion of the risks, benefits and alternatives to treatment. A timeout was performed prior to the initiation of the procedure. Initial ultrasound scanning demonstrates a small amount of ascites within the right lower abdominal quadrant. The right lower abdomen was prepped and draped in the usual sterile fashion. 1% lidocaine was used for local anesthesia. Following this, a 19 gauge, 7-cm, Yueh catheter was introduced. An ultrasound image was saved for documentation purposes. The paracentesis was performed. The catheter was removed and a dressing was applied. The patient tolerated the procedure well without immediate post procedural complication. FINDINGS: A total of approximately 1.7 liters of clear, yellow fluid was removed. Samples were sent to the laboratory as requested by the clinical team. IMPRESSION: Successful ultrasound-guided paracentesis yielding 1.7 liters of peritoneal fluid. Performed by: Loyce Dys PA-C Electronically Signed   By: Simonne Come M.D.   On: 11/30/2022 13:09   DG Chest Port 1 View  Result Date: 11/30/2022 CLINICAL DATA:  67 year old male status post thoracentesis. EXAM: PORTABLE CHEST 1 VIEW COMPARISON:  Chest x-ray 12/21/2022. FINDINGS: Left-sided subclavian single-lumen Port-A-Cath with tip terminating at the superior cavoatrial junction. Lung volumes are low. Bibasilar opacities may reflect areas of atelectasis and/or consolidation with superimposed moderate right and small left pleural effusions. No pneumothorax. No evidence of pulmonary edema. Heart size appears normal. Upper mediastinal contours are within normal limits. IMPRESSION: 1. Support apparatus, as above. 2. Low lung volumes with extensive bibasilar areas of atelectasis and/or consolidation with superimposed moderate right and small left pleural effusions. Right pleural effusion has increased in size, while the left pleural  effusion appears decreased in size. No postprocedural pneumothorax appreciated. Electronically Signed   By: Trudie Reed M.D.   On: 11/30/2022 07:13   DG CHEST PORT 1 VIEW  Result Date: 12/03/2022 CLINICAL DATA:  Shortness of breath with abdominal distension EXAM: PORTABLE CHEST 1 VIEW COMPARISON:  11/18/2022, CT 10/11/2022, PET CT 11/28/2022 FINDINGS: Left-sided central venous port tip over the cavoatrial region. Possible small right effusion with hazy right infrahilar atelectasis. Moderate left pleural effusion with possible loculation. Airspace disease at left base. IMPRESSION: 1. Moderate left pleural effusion with possible loculation. Airspace disease at left base may reflect atelectasis or pneumonia. 2. Possible small right effusion with right infrahilar atelectasis. Electronically Signed   By: Jasmine Pang M.D.   On: 12/02/2022 15:15    Scheduled Meds:  Chlorhexidine Gluconate Cloth  6 each Topical Daily   enoxaparin (LOVENOX) injection  40 mg Subcutaneous Q24H   insulin aspart  0-6 Units Subcutaneous Q4H   liver oil-zinc oxide   Topical TID   nystatin  5 mL Oral QID   mouth rinse  15 mL Mouth Rinse 4 times per day   sodium chloride flush  3 mL Intravenous Q12H   Continuous Infusions:  ceFEPime (MAXIPIME) IV Stopped (11/30/22 1516)   metronidazole Stopped (11/30/22 1256)   potassium chloride 10 mEq (11/30/22 1547)   TPN ADULT (ION)      LOS: 1 day   Marguerita Merles, DO [facility] Available via Epic secure chat 7am-7pm After these hours, please refer to coverage provider listed on [email or web address] 11/30/2022, 3:52 PM  103*  BUN 39* 41*  --  40*  CREATININE 0.63 0.62 0.63 0.69  CALCIUM 9.2 8.7*  --  9.0  MG  --   --   --  1.9  PHOS  --   --   --  6.5*   GFR: Estimated Creatinine Clearance: 94.7 mL/min (by C-G formula based on SCr of 0.69 mg/dL). Liver Function Tests: Recent Labs  Lab 11/28/22 1012 12/01/2022 1130 11/27/2022 1625 11/30/22 0500  AST 20 27  --  26  ALT 13 18  --  18  ALKPHOS 111 119  --  111  BILITOT 1.4* 1.7*  --  2.8*  PROT 6.4* 6.2* 6.1* 6.2*  ALBUMIN 2.8* 2.0*  --  3.0*   No results for input(s): "LIPASE", "AMYLASE" in the last 168 hours. No results for input(s): "AMMONIA" in the last 168 hours. Coagulation Profile: Recent Labs  Lab 12/05/2022 1130  INR 1.2   Cardiac Enzymes: No results for input(s): "CKTOTAL", "CKMB", "CKMBINDEX", "TROPONINI" in the last 168 hours. BNP (last 3 results) No results for input(s): "PROBNP" in the last 8760 hours. HbA1C: No results for input(s): "HGBA1C" in the last 72 hours. CBG: Recent Labs  Lab 11/27/2022 1928 12/21/2022 2324 11/30/22 0454 11/30/22 0757 11/30/22 1154  GLUCAP 101* 90 88 93 95   Lipid Profile: No results for input(s): "CHOL", "HDL", "LDLCALC", "TRIG", "CHOLHDL", "LDLDIRECT" in the last 72 hours. Thyroid Function Tests: No results for input(s): "TSH", "T4TOTAL", "FREET4", "T3FREE", "THYROIDAB" in the last 72 hours. Anemia Panel: No results for input(s): "VITAMINB12", "FOLATE",  "FERRITIN", "TIBC", "IRON", "RETICCTPCT" in the last 72 hours. Sepsis Labs: Recent Labs  Lab 12/20/2022 1228 11/30/22 1123  PROCALCITON  --  0.66  LATICACIDVEN 1.8  --     Recent Results (from the past 240 hour(s))  Blood culture (routine x 2)     Status: None (Preliminary result)   Collection Time: 12/16/2022 11:20 AM   Specimen: BLOOD RIGHT HAND  Result Value Ref Range Status   Specimen Description   Final    BLOOD RIGHT HAND Performed at [facility], [address] [place], [place] [address]    Special Requests   Final    BOTTLES DRAWN AEROBIC AND ANAEROBIC Blood Culture adequate volume Performed at [facility], [address] [place], [place] [address]    Culture   Final    NO GROWTH < 24 HOURS Performed at [facility], [address], [place], [place] [address]    Report Status PENDING  Incomplete  Blood culture (routine x 2)     Status: None (Preliminary result)   Collection Time: 12/04/2022 11:20 AM   Specimen: BLOOD  Result Value Ref Range Status   Specimen Description   Final    BLOOD LEFT ANTECUBITAL Performed at [facility], [address] [place], [place] [address]    Special Requests   Final    BOTTLES DRAWN AEROBIC AND ANAEROBIC Blood Culture adequate volume Performed at [facility], [address] [place], [place] [address]    Culture   Final    NO GROWTH < 24 HOURS Performed at [facility], [address], [place], [place] [address]    Report Status PENDING  Incomplete  Resp panel by RT-PCR (RSV, Flu A&B, Covid) Anterior Nasal Swab     Status: None   Collection Time: 11/30/2022 12:00 PM   Specimen: Anterior Nasal Swab  Result Value Ref Range Status  enteral feedings -Continues to have significant leakage around J-tube and this is causing a large area of excoriation on the abdomen -wound care nurse consulted Surgery consulted to see if at this time it is appropriate to remove J-tube IV morphine 1 mg every 2 hours as needed ordered for pain -Surgery feels that they would recommend  continuing local wound care and consulting oncology and palliative for symptom management and they feel that he is not a surgical candidate at this time and feels that if he pursues more comfort focused measures that is very reasonable to remove the J-tube but recommending considering an ostomy pouch around the J-tube to try and control the leakage better   Oral Thrush -Based on home medication list patient had been started on Diflucan suspension by mouth but unclear if patient able to tolerate -We will switch to swish and swallow nystatin   Diabetes Mellitus Type 2 -Check CBGs every 4 hours with very sensitive sliding scale insulin -CBG Trend: Recent Labs  Lab 11/07/22 0737 11/13/22 0829 11/18/22 1203 11/21/22 1159 11/28/22 1012 12/09/2022 1130 11/30/22 0500  GLUCOSE 88 116* 140* 136* 123* 122* 103*  -CBG Trend: Recent Labs  Lab 11/28/22 1058 12/14/2022 1737 12/20/2022 1928 12/13/2022 2324 11/30/22 0454 11/30/22 0757 11/30/22 1154  GLUCAP 116* 99 101* 90 88 93 95    Severe protein calorie malnutrition with associated Hypoalbuminemia Nutrition Status: Nutrition Problem: Inadequate oral intake 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, fistula) Signs/Symptoms: NPO status Interventions: Refer to RD note for recommendations, TPN -Patient's Albumin Trend: Recent Labs  Lab 11/07/22 0737 11/13/22 0829 11/18/22 1203 11/21/22 1159 11/28/22 1012 11/25/2022 1130 11/30/22 0500  ALBUMIN 3.0* 2.8* 2.7* 2.7* 2.8* 2.0* 3.0*  -Continue to Monitor and Trend and repeat CMP in the AM -Given lack of ability to tolerate enteral feedings it is difficult to maintain appropriate serum protein levels with TPN alone   End-of-life care -Now that the patient and wife have decided on a DNR status patient and family may benefit from a formal palliative medicine consult to help facilitate end-of-life issues and this was done and hospice.  Fever has been introduced but patient would like to  speak with his primary oncologist -Prognosis appears poor   DVT prophylaxis: enoxaparin (LOVENOX) injection 40 mg Start: 12/01/2022 2200    Code Status: Limited: Do not attempt resuscitation (DNR) -DNR-LIMITED -Do Not Intubate/DNI  Family Communication: No family currently at bedside  Disposition Plan:  Level of care: Stepdown Status is: Inpatient Remains inpatient appropriate because: Needs further clinical improvement and further goals of care discussion and evaluation by specialists   Consultants:  Palliative Care Medicine Medical oncology General Surgery Pulmonary Interventional radiology for paracentesis  Procedures:  Left and right thoracentesis and a paracentesis done  Antimicrobials:  Anti-infectives (From admission, onward)    Start     Dose/Rate Route Frequency Ordered Stop   12/22/2022 2200  metroNIDAZOLE (FLAGYL) IVPB 500 mg        500 mg 100 mL/hr over 60 Minutes Intravenous Every 12 hours 12/17/2022 1404     11/30/2022 2200  ceFEPIme (MAXIPIME) 2 g in sodium chloride 0.9 % 100 mL IVPB        2 g 200 mL/hr over 30 Minutes Intravenous Every 8 hours 11/25/2022 1418     12/17/2022 2200  vancomycin (VANCOREADY) IVPB 1250 mg/250 mL  Status:  Discontinued        1,250 mg 166.7 mL/hr over 90 Minutes Intravenous Every 12 hours 12/09/2022 1418 11/30/22 0956  enteral feedings -Continues to have significant leakage around J-tube and this is causing a large area of excoriation on the abdomen -wound care nurse consulted Surgery consulted to see if at this time it is appropriate to remove J-tube IV morphine 1 mg every 2 hours as needed ordered for pain -Surgery feels that they would recommend  continuing local wound care and consulting oncology and palliative for symptom management and they feel that he is not a surgical candidate at this time and feels that if he pursues more comfort focused measures that is very reasonable to remove the J-tube but recommending considering an ostomy pouch around the J-tube to try and control the leakage better   Oral Thrush -Based on home medication list patient had been started on Diflucan suspension by mouth but unclear if patient able to tolerate -We will switch to swish and swallow nystatin   Diabetes Mellitus Type 2 -Check CBGs every 4 hours with very sensitive sliding scale insulin -CBG Trend: Recent Labs  Lab 11/07/22 0737 11/13/22 0829 11/18/22 1203 11/21/22 1159 11/28/22 1012 12/21/2022 1130 11/30/22 0500  GLUCOSE 88 116* 140* 136* 123* 122* 103*  -CBG Trend: Recent Labs  Lab 11/28/22 1058 12/13/2022 1737 11/30/2022 1928 12/10/2022 2324 11/30/22 0454 11/30/22 0757 11/30/22 1154  GLUCAP 116* 99 101* 90 88 93 95    Severe protein calorie malnutrition with associated Hypoalbuminemia Nutrition Status: Nutrition Problem: Inadequate oral intake 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, fistula) Signs/Symptoms: NPO status Interventions: Refer to RD note for recommendations, TPN -Patient's Albumin Trend: Recent Labs  Lab 11/07/22 0737 11/13/22 0829 11/18/22 1203 11/21/22 1159 11/28/22 1012 12/18/2022 1130 11/30/22 0500  ALBUMIN 3.0* 2.8* 2.7* 2.7* 2.8* 2.0* 3.0*  -Continue to Monitor and Trend and repeat CMP in the AM -Given lack of ability to tolerate enteral feedings it is difficult to maintain appropriate serum protein levels with TPN alone   End-of-life care -Now that the patient and wife have decided on a DNR status patient and family may benefit from a formal palliative medicine consult to help facilitate end-of-life issues and this was done and hospice.  Fever has been introduced but patient would like to  speak with his primary oncologist -Prognosis appears poor   DVT prophylaxis: enoxaparin (LOVENOX) injection 40 mg Start: 12/09/2022 2200    Code Status: Limited: Do not attempt resuscitation (DNR) -DNR-LIMITED -Do Not Intubate/DNI  Family Communication: No family currently at bedside  Disposition Plan:  Level of care: Stepdown Status is: Inpatient Remains inpatient appropriate because: Needs further clinical improvement and further goals of care discussion and evaluation by specialists   Consultants:  Palliative Care Medicine Medical oncology General Surgery Pulmonary Interventional radiology for paracentesis  Procedures:  Left and right thoracentesis and a paracentesis done  Antimicrobials:  Anti-infectives (From admission, onward)    Start     Dose/Rate Route Frequency Ordered Stop   12/09/2022 2200  metroNIDAZOLE (FLAGYL) IVPB 500 mg        500 mg 100 mL/hr over 60 Minutes Intravenous Every 12 hours 11/27/2022 1404     11/25/2022 2200  ceFEPIme (MAXIPIME) 2 g in sodium chloride 0.9 % 100 mL IVPB        2 g 200 mL/hr over 30 Minutes Intravenous Every 8 hours 12/10/2022 1418     12/17/2022 2200  vancomycin (VANCOREADY) IVPB 1250 mg/250 mL  Status:  Discontinued        1,250 mg 166.7 mL/hr over 90 Minutes Intravenous Every 12 hours 12/21/2022 1418 11/30/22 0956  Studies: US Paracentesis  Result Date: 11/30/2022 INDICATION: Patient with metastatic gastric cancer, new ascites. Request for paracentesis. EXAM: ULTRASOUND GUIDED DIAGNOSTIC AND THERAPEUTIC PARACENTESIS MEDICATIONS: 10 mL 1% lidocaine COMPLICATIONS:  None immediate. PROCEDURE: Informed written consent was obtained from the patient after a discussion of the risks, benefits and alternatives to treatment. A timeout was performed prior to the initiation of the procedure. Initial ultrasound scanning demonstrates a small amount of ascites within the right lower abdominal quadrant. The right lower abdomen was prepped and draped in the usual sterile fashion. 1% lidocaine was used for local anesthesia. Following this, a 19 gauge, 7-cm, Yueh catheter was introduced. An ultrasound image was saved for documentation purposes. The paracentesis was performed. The catheter was removed and a dressing was applied. The patient tolerated the procedure well without immediate post procedural complication. FINDINGS: A total of approximately 1.7 liters of clear, yellow fluid was removed. Samples were sent to the laboratory as requested by the clinical team. IMPRESSION: Successful ultrasound-guided paracentesis yielding 1.7 liters of peritoneal fluid. Performed by: Loyce Dys PA-C Electronically Signed   By: Simonne Come M.D.   On: 11/30/2022 13:09   DG Chest Port 1 View  Result Date: 11/30/2022 CLINICAL DATA:  67 year old male status post thoracentesis. EXAM: PORTABLE CHEST 1 VIEW COMPARISON:  Chest x-ray 12/21/2022. FINDINGS: Left-sided subclavian single-lumen Port-A-Cath with tip terminating at the superior cavoatrial junction. Lung volumes are low. Bibasilar opacities may reflect areas of atelectasis and/or consolidation with superimposed moderate right and small left pleural effusions. No pneumothorax. No evidence of pulmonary edema. Heart size appears normal. Upper mediastinal contours are within normal limits. IMPRESSION: 1. Support apparatus, as above. 2. Low lung volumes with extensive bibasilar areas of atelectasis and/or consolidation with superimposed moderate right and small left pleural effusions. Right pleural effusion has increased in size, while the left pleural  effusion appears decreased in size. No postprocedural pneumothorax appreciated. Electronically Signed   By: Trudie Reed M.D.   On: 11/30/2022 07:13   DG CHEST PORT 1 VIEW  Result Date: 12/03/2022 CLINICAL DATA:  Shortness of breath with abdominal distension EXAM: PORTABLE CHEST 1 VIEW COMPARISON:  11/18/2022, CT 10/11/2022, PET CT 11/28/2022 FINDINGS: Left-sided central venous port tip over the cavoatrial region. Possible small right effusion with hazy right infrahilar atelectasis. Moderate left pleural effusion with possible loculation. Airspace disease at left base. IMPRESSION: 1. Moderate left pleural effusion with possible loculation. Airspace disease at left base may reflect atelectasis or pneumonia. 2. Possible small right effusion with right infrahilar atelectasis. Electronically Signed   By: Jasmine Pang M.D.   On: 12/02/2022 15:15    Scheduled Meds:  Chlorhexidine Gluconate Cloth  6 each Topical Daily   enoxaparin (LOVENOX) injection  40 mg Subcutaneous Q24H   insulin aspart  0-6 Units Subcutaneous Q4H   liver oil-zinc oxide   Topical TID   nystatin  5 mL Oral QID   mouth rinse  15 mL Mouth Rinse 4 times per day   sodium chloride flush  3 mL Intravenous Q12H   Continuous Infusions:  ceFEPime (MAXIPIME) IV Stopped (11/30/22 1516)   metronidazole Stopped (11/30/22 1256)   potassium chloride 10 mEq (11/30/22 1547)   TPN ADULT (ION)      LOS: 1 day   Marguerita Merles, DO [facility] Available via Epic secure chat 7am-7pm After these hours, please refer to coverage provider listed on [email or web address] 11/30/2022, 3:52 PM  103*  BUN 39* 41*  --  40*  CREATININE 0.63 0.62 0.63 0.69  CALCIUM 9.2 8.7*  --  9.0  MG  --   --   --  1.9  PHOS  --   --   --  6.5*   GFR: Estimated Creatinine Clearance: 94.7 mL/min (by C-G formula based on SCr of 0.69 mg/dL). Liver Function Tests: Recent Labs  Lab 11/28/22 1012 12/01/2022 1130 11/27/2022 1625 11/30/22 0500  AST 20 27  --  26  ALT 13 18  --  18  ALKPHOS 111 119  --  111  BILITOT 1.4* 1.7*  --  2.8*  PROT 6.4* 6.2* 6.1* 6.2*  ALBUMIN 2.8* 2.0*  --  3.0*   No results for input(s): "LIPASE", "AMYLASE" in the last 168 hours. No results for input(s): "AMMONIA" in the last 168 hours. Coagulation Profile: Recent Labs  Lab 12/05/2022 1130  INR 1.2   Cardiac Enzymes: No results for input(s): "CKTOTAL", "CKMB", "CKMBINDEX", "TROPONINI" in the last 168 hours. BNP (last 3 results) No results for input(s): "PROBNP" in the last 8760 hours. HbA1C: No results for input(s): "HGBA1C" in the last 72 hours. CBG: Recent Labs  Lab 11/27/2022 1928 12/21/2022 2324 11/30/22 0454 11/30/22 0757 11/30/22 1154  GLUCAP 101* 90 88 93 95   Lipid Profile: No results for input(s): "CHOL", "HDL", "LDLCALC", "TRIG", "CHOLHDL", "LDLDIRECT" in the last 72 hours. Thyroid Function Tests: No results for input(s): "TSH", "T4TOTAL", "FREET4", "T3FREE", "THYROIDAB" in the last 72 hours. Anemia Panel: No results for input(s): "VITAMINB12", "FOLATE",  "FERRITIN", "TIBC", "IRON", "RETICCTPCT" in the last 72 hours. Sepsis Labs: Recent Labs  Lab 12/20/2022 1228 11/30/22 1123  PROCALCITON  --  0.66  LATICACIDVEN 1.8  --     Recent Results (from the past 240 hour(s))  Blood culture (routine x 2)     Status: None (Preliminary result)   Collection Time: 12/16/2022 11:20 AM   Specimen: BLOOD RIGHT HAND  Result Value Ref Range Status   Specimen Description   Final    BLOOD RIGHT HAND Performed at [facility], [address] [place], [place] [address]    Special Requests   Final    BOTTLES DRAWN AEROBIC AND ANAEROBIC Blood Culture adequate volume Performed at [facility], [address] [place], [place] [address]    Culture   Final    NO GROWTH < 24 HOURS Performed at [facility], [address], [place], [place] [address]    Report Status PENDING  Incomplete  Blood culture (routine x 2)     Status: None (Preliminary result)   Collection Time: 12/04/2022 11:20 AM   Specimen: BLOOD  Result Value Ref Range Status   Specimen Description   Final    BLOOD LEFT ANTECUBITAL Performed at [facility], [address] [place], [place] [address]    Special Requests   Final    BOTTLES DRAWN AEROBIC AND ANAEROBIC Blood Culture adequate volume Performed at [facility], [address] [place], [place] [address]    Culture   Final    NO GROWTH < 24 HOURS Performed at [facility], [address], [place], [place] [address]    Report Status PENDING  Incomplete  Resp panel by RT-PCR (RSV, Flu A&B, Covid) Anterior Nasal Swab     Status: None   Collection Time: 11/30/2022 12:00 PM   Specimen: Anterior Nasal Swab  Result Value Ref Range Status  Studies: US Paracentesis  Result Date: 11/30/2022 INDICATION: Patient with metastatic gastric cancer, new ascites. Request for paracentesis. EXAM: ULTRASOUND GUIDED DIAGNOSTIC AND THERAPEUTIC PARACENTESIS MEDICATIONS: 10 mL 1% lidocaine COMPLICATIONS:  None immediate. PROCEDURE: Informed written consent was obtained from the patient after a discussion of the risks, benefits and alternatives to treatment. A timeout was performed prior to the initiation of the procedure. Initial ultrasound scanning demonstrates a small amount of ascites within the right lower abdominal quadrant. The right lower abdomen was prepped and draped in the usual sterile fashion. 1% lidocaine was used for local anesthesia. Following this, a 19 gauge, 7-cm, Yueh catheter was introduced. An ultrasound image was saved for documentation purposes. The paracentesis was performed. The catheter was removed and a dressing was applied. The patient tolerated the procedure well without immediate post procedural complication. FINDINGS: A total of approximately 1.7 liters of clear, yellow fluid was removed. Samples were sent to the laboratory as requested by the clinical team. IMPRESSION: Successful ultrasound-guided paracentesis yielding 1.7 liters of peritoneal fluid. Performed by: Loyce Dys PA-C Electronically Signed   By: Simonne Come M.D.   On: 11/30/2022 13:09   DG Chest Port 1 View  Result Date: 11/30/2022 CLINICAL DATA:  67 year old male status post thoracentesis. EXAM: PORTABLE CHEST 1 VIEW COMPARISON:  Chest x-ray 12/09/2022. FINDINGS: Left-sided subclavian single-lumen Port-A-Cath with tip terminating at the superior cavoatrial junction. Lung volumes are low. Bibasilar opacities may reflect areas of atelectasis and/or consolidation with superimposed moderate right and small left pleural effusions. No pneumothorax. No evidence of pulmonary edema. Heart size appears normal. Upper mediastinal contours are within normal limits. IMPRESSION: 1. Support apparatus, as above. 2. Low lung volumes with extensive bibasilar areas of atelectasis and/or consolidation with superimposed moderate right and small left pleural effusions. Right pleural effusion has increased in size, while the left pleural  effusion appears decreased in size. No postprocedural pneumothorax appreciated. Electronically Signed   By: Trudie Reed M.D.   On: 11/30/2022 07:13   DG CHEST PORT 1 VIEW  Result Date: 12/05/2022 CLINICAL DATA:  Shortness of breath with abdominal distension EXAM: PORTABLE CHEST 1 VIEW COMPARISON:  11/18/2022, CT 10/11/2022, PET CT 11/28/2022 FINDINGS: Left-sided central venous port tip over the cavoatrial region. Possible small right effusion with hazy right infrahilar atelectasis. Moderate left pleural effusion with possible loculation. Airspace disease at left base. IMPRESSION: 1. Moderate left pleural effusion with possible loculation. Airspace disease at left base may reflect atelectasis or pneumonia. 2. Possible small right effusion with right infrahilar atelectasis. Electronically Signed   By: Jasmine Pang M.D.   On: 12/08/2022 15:15    Scheduled Meds:  Chlorhexidine Gluconate Cloth  6 each Topical Daily   enoxaparin (LOVENOX) injection  40 mg Subcutaneous Q24H   insulin aspart  0-6 Units Subcutaneous Q4H   liver oil-zinc oxide   Topical TID   nystatin  5 mL Oral QID   mouth rinse  15 mL Mouth Rinse 4 times per day   sodium chloride flush  3 mL Intravenous Q12H   Continuous Infusions:  ceFEPime (MAXIPIME) IV Stopped (11/30/22 1516)   metronidazole Stopped (11/30/22 1256)   potassium chloride 10 mEq (11/30/22 1547)   TPN ADULT (ION)      LOS: 1 day   Marguerita Merles, DO [facility] Available via Epic secure chat 7am-7pm After these hours, please refer to coverage provider listed on [email or web address] 11/30/2022, 3:52 PM

## 2022-11-30 NOTE — Plan of Care (Signed)

## 2022-11-30 NOTE — Consult Note (Signed)
Consultation Note Date: 11/30/2022   Patient Name: Steven Wolf  DOB: 07-Apr-1955  MRN: [id number]  Age / Sex: 67 y.o., male  PCP: Pincus Sanes, MD Referring Physician: Merlene Laughter, DO  Reason for Consultation: Establishing goals of care  HPI/Patient Profile: 67 y.o. male    admitted on 2022-12-05    Clinical Assessment and Goals of Care: 67 year old gentleman who lives at home [place] his wife in [place], [place], known to palliative medicine service from recent previous hospitalization. 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 Patient admitted with worsening shortness of breath and lower extremity edema.  Patient admitted to stepdown under hospital medicine service.  Additionally pulmonary colleagues also consulted for pleural effusions. Patient has possible infection around his J-tube.  He has third spacing.  He has been having a lot of discharge around his G-tube site. Patient underwent thoracenteses both the left and the right side. Interventional radiology also following. Palliative consult continues. Palliative medicine is specialized medical care for people living with serious illness. It focuses on providing relief from the symptoms and stress of a serious illness. The goal is to improve quality of life for both the patient and the family. Goals of care: Broad aims of medical therapy in relation to the patient's values and preferences. Our aim is to provide medical care aimed at enabling patients to achieve the goals that matter most to them, given the circumstances of their particular medical situation and their constraints.     NEXT OF KIN  Lives at home with wife Gardner Candle son in [place], [place].    SUMMARY OF RECOMMENDATIONS   Goals of care discussions undertaken with the patient and his wife [name]  was also present at bedside.  Patient and his wife have met with this provider as well as with my colleague Dr. Patterson Hammersmith from palliative medicine in recent previous hospitalization.  They discussed that the patient remains on palliative immunotherapy and is under the care of Dr. Mosetta Putt.  Patient has a had a lot of discomfort and leakage around his G-tube site.  He remains on TPN.  Patient states that he feels he is having slow but certainly progressive ongoing decline.  He believes that he does not have adequate quality of life.  He inquires about comfort-focused care, he inquires about hospice philosophy of care and wishes to know what hospice care will realistically look like.  Introduced hospice philosophy of care, discussed about comfort measures, discussed about differences between home with hospice versus what care typically looks like inside a residential hospice facility setting.  All of the patient and wife's questions addressed to the best of my ability. For now, they wish to continue with current mode of care, they wish to discuss further with Dr. Mosetta Putt on 12-02-2022 prior to making any further decisions.  Palliative will continue to follow along. Thank you for the consult.  Code Status/Advance Care Planning: DNR   Symptom Management:   Denies pain, denies  shortness of breath currently.   Palliative Prophylaxis:  Delirium Protocol    Psycho-social/Spiritual:  Desire for further Chaplaincy support:yes Additional Recommendations: Caregiving  Support/Resources  Prognosis:  Unable to determine  Discharge Planning: To Be Determined      Primary Diagnoses: Present on Admission:  Sepsis (HCC)   I have reviewed the medical record, interviewed the patient and family, and examined the patient. The following aspects are pertinent.  Past Medical History:  Diagnosis Date   Anxiety    Benign prostatic hypertrophy    Diverticulosis of colon    GERD (gastroesophageal reflux disease)     Sullivan Lone syndrome    History of kidney stones    Hypertension    Microscopic hematuria    Motion sickness    ocean boats, curvy roads   Nephrolithiasis     X 2; Dr Aldean Ast   Sleep apnea    USES CPAP   Syncope 02/2007   NOCTURNAL POST MICTURTION   Urticaria    Social History   Socioeconomic History   Marital status: Married    Spouse name: Not on file   Number of children: 0   Years of education: Not on file   Highest education level: Master's degree (e.g., MA, MS, MEng, MEd, MSW, MBA)  Occupational History   Not on file  Tobacco Use   Smoking status: Never   Smokeless tobacco: Never   Tobacco comments:    Second hand smoke  Vaping Use   Vaping status: Never Used  Substance and Sexual Activity   Alcohol use: No   Drug use: No   Sexual activity: Not on file  Other Topics Concern   Not on file  Social History Narrative   Right Handed   Lives in a two story home    Drinks some caffeine    Social Determinants of Health   Financial Resource Strain: Low Risk  (08/12/2022)   Overall Financial Resource Strain (CARDIA)    Difficulty of Paying Living Expenses: Not hard at all  Food Insecurity: No Food Insecurity (11/04/2022)   Hunger Vital Sign    Worried About Running Out of Food in the Last Year: Never true    Ran Out of Food in the Last Year: Never true  Transportation Needs: No Transportation Needs (11/04/2022)   PRAPARE - Administrator, Civil Service (Medical): No    Lack of Transportation (Non-Medical): No  Physical Activity: Sufficiently Active (08/12/2022)   Exercise Vital Sign    Days of Exercise per Week: 6 days    Minutes of Exercise per Session: 50 min  Stress: Stress Concern Present (08/12/2022)   [employer] of Occupational Health - Occupational Stress Questionnaire    Feeling of Stress : To some extent  Social Connections: Unknown (08/12/2022)   Social Connection and Isolation Panel [NHANES]    Frequency of Communication with Friends  and Family: Once a week    Frequency of Social Gatherings with Friends and Family: Patient declined    Attends Religious Services: Never    Diplomatic Services operational officer: Not on file    Attends Banker Meetings: Not on file    Marital Status: Married   Family History  Problem Relation Age of Onset   Breast cancer Mother        dx 27s   Rheumatologic disease Mother        RA   Heart disease Father        PCV;MI ? 61  Polycythemia Father    Sleep apnea Sister    Polycythemia Paternal Uncle    Leukemia Paternal Uncle        dx >50   Stroke Maternal Grandmother        . 65   Heart attack Paternal Grandfather        > 55   Skin cancer Half-Sister        unknown type; excision only   Diabetes Neg Hx    Colon cancer Neg Hx    Colon polyps Neg Hx    Esophageal cancer Neg Hx    Stomach cancer Neg Hx    Rectal cancer Neg Hx    Scheduled Meds:  Chlorhexidine Gluconate Cloth  6 each Topical Daily   enoxaparin (LOVENOX) injection  40 mg Subcutaneous Q24H   insulin aspart  0-6 Units Subcutaneous Q4H   liver oil-zinc oxide   Topical TID   nystatin  5 mL Oral QID   mouth rinse  15 mL Mouth Rinse 4 times per day   sodium chloride flush  3 mL Intravenous Q12H   Continuous Infusions:  ceFEPime (MAXIPIME) IV Stopped (11/30/22 0700)   metronidazole 500 mg (11/30/22 1156)   potassium chloride 10 mEq (11/30/22 1258)   TPN ADULT (ION)     PRN Meds:.acetaminophen **OR** acetaminophen, ipratropium, levalbuterol, morphine injection, ondansetron **OR** ondansetron (ZOFRAN) IV, mouth rinse Medications Prior to Admission:  Prior to Admission medications   Medication Sig Start Date End Date Taking? Authorizing Provider  omeprazole (PRILOSEC) 20 MG capsule Take 20 mg by mouth. 08/13/22  Yes [provider]  sodium chloride 0.9 % infusion Inject into the vein. 11/26/22  Yes [provider]  Accu-Chek Softclix Lancets lancets Use to test blood sugar 4 times  a day 11/20/22   Pincus Sanes, MD  blood glucose meter kit and supplies KIT Dispense based on patient and insurance preference. Use up to four times daily as directed. 11/03/22   Briant Cedar, MD  Blood Glucose Monitoring Suppl (ACCU-CHEK GUIDE) w/Device KIT Use to test blood sugar 4 times a day 11/04/22   Briant Cedar, MD  fluconazole (DIFLUCAN) 10 MG/ML suspension Take 10 mLs (100 mg total) by mouth daily. 11/26/22     FLUoxetine (PROZAC) 20 MG tablet Take 1 tablet (20 mg total) by mouth daily. 11/20/22   Pincus Sanes, MD  glucose blood (ACCU-CHEK GUIDE) test strip Use to test blood sugar four times daily as directed 11/20/22   Pincus Sanes, MD  insulin aspart (NOVOLOG) 100 UNIT/ML FlexPen Inject  into the skin according to sliding scale: If CBG 121-150 inject 2 units,151-200 3 units,201-250 5 units,251-300 8 units,301-350 11 units,351-400 15 units,over 400 call MD 11/20/22   Pincus Sanes, MD  Insulin Pen Needle (TECHLITE PLUS PEN NEEDLES) 32G X 4 MM MISC Use to inject insulin as directed 11/20/22   Pincus Sanes, MD  Omeprazole-Sodium Bicarbonate 2-84 MG/ML SUSR Take 20 mLs by mouth daily. 11/07/22   [name], MD  ondansetron (ZOFRAN-ODT) 4 MG disintegrating tablet Dissolve 1-2 tablets (4-8 mg total) by mouth every 8 (eight) hours as needed for nausea or vomiting. 11/07/22   [name], MD  pantoprazole (PROTONIX) 40 MG injection Inject 40 mg into the vein daily. 11/03/22 12/03/22  Briant Cedar, MD  PRESCRIPTION MEDICATION CPAP- At bedtime as determined by the patient    [provider]   Allergies  Allergen Reactions   Ramipril Other (See Comments)  Elevated liver enzymes   Penicillins Other (See Comments)    ? Reaction (as child)   Review of Systems Denies pain Physical Exam  generalized weakness Appears chronically ill [place] to have regular work of breathing Abdomen not distended Awake alert oriented Monitor noted  Vital Signs: BP 99/69 (BP Location:  Left Arm)   Pulse (!) 124   Temp 98.5 F (36.9 C) (Oral)   Resp 14   Ht 6' (1.829 m)   Wt 76.8 kg   SpO2 93%   BMI 22.96 kg/m  Pain Scale: 0-10   Pain Score: 0-No pain   SpO2: SpO2: 93 % O2 Device:SpO2: 93 % O2 Flow Rate: .   IO: Intake/output summary:  Intake/Output Summary (Last 24 hours) at 11/30/2022 1430 Last data filed at 11/30/2022 0308 Gross per 24 hour  Intake 978.27 ml  Output 2900 ml  Net -1921.73 ml    LBM: Last BM Date :  (PTA) Baseline Weight: Weight: 79.1 kg Most recent weight: Weight: 76.8 kg     Palliative Assessment/Data:   Palliative performance scale 60%.  Time In: 1320 Time Out: 1420 Time Total: 60 Greater than 50%  of this time was spent counseling and coordinating care related to the above assessment and plan.  Signed by: Rosalin Hawking, MD   Please contact Palliative Medicine Team phone at [phone] for questions and concerns.  For individual provider: See Loretha Stapler

## 2022-11-30 NOTE — Progress Notes (Signed)
PHARMACY - TOTAL PARENTERAL NUTRITION CONSULT NOTE   Indication: continuation of chronic home TPN  Patient Measurements: Height: 6' (182.9 cm) Weight: 76.8 kg (169 lb 5 oz) IBW/kg (Calculated) : 77.6 TPN AdjBW (KG): 79.1 Body mass index is 22.96 kg/m. Usual Weight: 75-85 kg  Assessment: 64 yoM with stage IV gastric adenocarcinoma and peritoneal metastasis, on chronic TPN at home. Does have J-G tube for feeding/venting, but has been unable to tolerate any jejunal feeding recently. Presents 14-Dec-2022 with pleural effusions, abdominal distention and ascites, LE edema, and persistent leaking around J-tube insertion site which has worsened, and now appears purulent. 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 Noted to have stable chronic leukocytosis at baseline. Patient will be admitted for rule-out of sepsis; Pharmacy to continue TPN while admitted.  Pharmacist contacted home infusion pharmacist Jill Alexanders with [facility] [phone])  Patient Measurements: Estimated body mass index is 22.96 kg/m as calculated from the following:   Height as of this encounter: 6' (1.829 m).   Weight as of this encounter: 76.8 kg (169 lb 5 oz).  Intake/Output: Net I/O - 1721 UOP 2900 mls Dec 14, 2022 S/p L  thoracentesis w/ 1700 mls out 12/14/2022 lasix 100 mg total IV x 1; 9/7 lasix 60 IV x 1 9/6 albumin 25 gm q6h x 4 doses  Estimated Nutritional Needs - Per RD recommendations on 11/30/22 kCal: 2400-2600 Protein: 110-130 grams Fluid: > 2.4 L  [facility] TPN formula:  2100 mls over 12 hours providing 2012 total Kcals, 115 gm protein Lytes per 24 hrs: Na 105: K 107; CL 110: ace 168, Phos 36.5; Ca 10, Mag 10  TPN @ 87.5 mls/hr will provide ~ 115.5 gm protein & ~ 2306 Kcals for 100% support  Recent Labs    Dec 14, 2022 1130 12-14-22 1625 11/30/22 0500  NA 137  --  138  K 4.3  --  3.2*  CL 102  --  100  CO2 25  --  24  GLUCOSE 122*  --  103*  BUN 41*  --  40*  CREATININE 0.62 0.63 0.69   CALCIUM 8.7*  --  9.0  PHOS  --   --  6.5*  MG  --   --  1.9  ALBUMIN 2.0*  --  3.0*  ALKPHOS 119  --  111  AST 27  --  26  ALT 18  --  18  BILITOT 1.7*  --  2.8*   Insulin Requirements: no hx DM, no insulin in TPN PTA. CBGs 88-103 off TPN Lytes: K 3.2 - low; phos elevated at 6.5. all others WNL  Current Nutrition: NPO, cyclic TPN PTA  IVF: none  Central access: PAC  TPN start date: PTA   PLAN                                                                                                                         Now: 6 runs KCL  At 1800 today: Start/resume TPN at 87.5 ml/hr. Per  d/w MD will change from 12 hour cycle to continuous infusion TPN @ 87.5 mls/hr will provide ~ 115.5 gm protein & ~ 2306 Kcals for 100% support Electrolytes in TPN: Standard except no phos, Cl:Ac ratio 1:1 TPN to contain standard multivitamins and trace elements. Very sensitive SSI q4h ordered by MD  TPN lab panels on Mondays & Thursdays. BMET, Mg & Phos in AM  Herby Abraham, Pharm.D Use secure chat for questions 11/30/2022 9:45 AM

## 2022-12-01 ENCOUNTER — Inpatient Hospital Stay (HOSPITAL_COMMUNITY): Payer: Medicare PPO

## 2022-12-01 DIAGNOSIS — M7989 Other specified soft tissue disorders: Secondary | ICD-10-CM

## 2022-12-01 DIAGNOSIS — A419 Sepsis, unspecified organism: Secondary | ICD-10-CM | POA: Diagnosis not present

## 2022-12-01 DIAGNOSIS — J90 Pleural effusion, not elsewhere classified: Secondary | ICD-10-CM | POA: Diagnosis not present

## 2022-12-01 DIAGNOSIS — Z7189 Other specified counseling: Secondary | ICD-10-CM | POA: Diagnosis not present

## 2022-12-01 DIAGNOSIS — Z515 Encounter for palliative care: Secondary | ICD-10-CM | POA: Diagnosis not present

## 2022-12-01 LAB — COMPREHENSIVE METABOLIC PANEL
ALT: 25 U/L (ref 0–44)
AST: 35 U/L (ref 15–41)
Albumin: 2.4 g/dL — ABNORMAL LOW (ref 3.5–5.0)
Alkaline Phosphatase: 100 U/L (ref 38–126)
Anion gap: 10 (ref 5–15)
BUN: 48 mg/dL — ABNORMAL HIGH (ref 8–23)
CO2: 25 mmol/L (ref 22–32)
Calcium: 8.6 mg/dL — ABNORMAL LOW (ref 8.9–10.3)
Chloride: 105 mmol/L (ref 98–111)
Creatinine, Ser: 0.95 mg/dL (ref 0.61–1.24)
GFR, Estimated: >60 mL/min (ref >60)
Glucose, Bld: 228 mg/dL — ABNORMAL HIGH (ref 70–99)
Potassium: 3.4 mmol/L — ABNORMAL LOW (ref 3.5–5.1)
Sodium: 140 mmol/L (ref 135–145)
Total Bilirubin: 1.7 mg/dL — ABNORMAL HIGH (ref 0.3–1.2)
Total Protein: 5.5 g/dL — ABNORMAL LOW (ref 6.5–8.1)

## 2022-12-01 LAB — CBC WITH DIFFERENTIAL/PLATELET
Abs Immature Granulocytes: 0.29 10*3/uL — ABNORMAL HIGH (ref 0.00–0.07)
Basophils Absolute: 0.1 10*3/uL (ref 0.0–0.1)
Basophils Relative: 0 %
Eosinophils Absolute: 0.2 10*3/uL (ref 0.0–0.5)
Eosinophils Relative: 1 %
HCT: 31.2 % — ABNORMAL LOW (ref 39.0–52.0)
Hemoglobin: 10.1 g/dL — ABNORMAL LOW (ref 13.0–17.0)
Immature Granulocytes: 2 %
Lymphocytes Relative: 2 %
Lymphs Abs: 0.3 10*3/uL — ABNORMAL LOW (ref 0.7–4.0)
MCH: 28.8 pg (ref 26.0–34.0)
MCHC: 32.4 g/dL (ref 30.0–36.0)
MCV: 88.9 fL (ref 80.0–100.0)
Monocytes Absolute: 1.4 10*3/uL — ABNORMAL HIGH (ref 0.1–1.0)
Monocytes Relative: 7 %
Neutro Abs: 16.5 10*3/uL — ABNORMAL HIGH (ref 1.7–7.7)
Neutrophils Relative %: 88 %
Platelets: 303 10*3/uL (ref 150–400)
RBC: 3.51 MIL/uL — ABNORMAL LOW (ref 4.22–5.81)
RDW: 14.2 % (ref 11.5–15.5)
WBC: 18.7 10*3/uL — ABNORMAL HIGH (ref 4.0–10.5)
nRBC: 0 % (ref 0.0–0.2)

## 2022-12-01 LAB — GLUCOSE, CAPILLARY
Glucose-Capillary: 172 mg/dL — ABNORMAL HIGH (ref 70–99)
Glucose-Capillary: 185 mg/dL — ABNORMAL HIGH (ref 70–99)
Glucose-Capillary: 191 mg/dL — ABNORMAL HIGH (ref 70–99)
Glucose-Capillary: 194 mg/dL — ABNORMAL HIGH (ref 70–99)
Glucose-Capillary: 202 mg/dL — ABNORMAL HIGH (ref 70–99)
Glucose-Capillary: 205 mg/dL — ABNORMAL HIGH (ref 70–99)

## 2022-12-01 LAB — MAGNESIUM: Magnesium: 2.1 mg/dL (ref 1.7–2.4)

## 2022-12-01 LAB — PHOSPHORUS: Phosphorus: 3.1 mg/dL (ref 2.5–4.6)

## 2022-12-01 LAB — TOTAL BILIRUBIN, BODY FLUID

## 2022-12-01 MED ORDER — INSULIN ASPART 100 UNIT/ML IJ SOLN
0.0000 [IU] | INTRAMUSCULAR | Status: DC
  Administered 2022-12-01 (×2): 2 [IU] via SUBCUTANEOUS
  Administered 2022-12-01: 3 [IU] via SUBCUTANEOUS
  Administered 2022-12-02 (×3): 2 [IU] via SUBCUTANEOUS

## 2022-12-01 MED ORDER — POTASSIUM CHLORIDE 10 MEQ/50ML IV SOLN
10.0000 meq | INTRAVENOUS | Status: DC
  Filled 2022-12-01: qty 50

## 2022-12-01 MED ORDER — POTASSIUM CHLORIDE 10 MEQ/100ML IV SOLN
10.0000 meq | INTRAVENOUS | Status: AC
  Administered 2022-12-01 (×4): 10 meq via INTRAVENOUS
  Filled 2022-12-01: qty 100

## 2022-12-01 MED ORDER — TRAVASOL 10 % IV SOLN
INTRAVENOUS | Status: AC
  Filled 2022-12-01: qty 1155

## 2022-12-01 MED ORDER — ZINC OXIDE 12.8 % EX OINT: TOPICAL_OINTMENT | CUTANEOUS | Status: DC | PRN

## 2022-12-01 NOTE — Progress Notes (Signed)
NAME:  Steven Wolf, MRN:  [id number], DOB:  07/27/1955, LOS: 2 ADMISSION DATE:  November 30, 2022, CONSULTATION DATE: 30-Nov-2022 REFERRING MD: Dr. 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, peritoneal metastasis came in with worsening shortness of breath, leg edema   Asked to see for pleural effusions  Pertinent  Medical History   Past Medical History:  Diagnosis Date   Anxiety    Benign prostatic hypertrophy    Diverticulosis of colon    GERD (gastroesophageal reflux disease)    Sullivan Lone syndrome    History of kidney stones    Hypertension    Microscopic hematuria    Motion sickness    ocean boats, curvy roads   Nephrolithiasis     X 2; Dr Aldean Ast   Sleep apnea    USES CPAP   Syncope 02/2007   NOCTURNAL POST MICTURTION   Urticaria    Significant Hospital Events: Including procedures, antibiotic start and stop dates in addition to other pertinent events   X-ray with bilateral pleural effusion, will left worse than right  Interim History / Subjective:  Postthoracentesis for 1.7 L of fluid on left Post paracentesis for 1.7 L of fluid Postthoracentesis on the right for 900 cc Breathing feels a little bit better  Objective   Blood pressure 107/79, pulse (!) 122, temperature 97.8 F (36.6 C), temperature source Axillary, resp. rate (!) 23, height 6' (1.829 m), weight 76.4 kg, SpO2 94%.        Intake/Output Summary (Last 24 hours) at 12/01/2022 0837 Last data filed at 12/01/2022 0800 Gross per 24 hour  Intake 1267.22 ml  Output 2550 ml  Net -1282.78 ml   Filed Weights   11-30-22 1600 11/30/22 0457 12/01/22 0500  Weight: 79.1 kg 76.8 kg 76.4 kg    Examination: General: Elderly, chronically ill-appearing HENT: Moist oral mucosa Lungs: Fair air entry bilaterally with few rales at the bases Cardiovascular: S1-S2 appreciated without Abdomen: Full, bowel sounds  appreciated Extremities: Warm and dry Neuro: Alert and oriented x 3 GU:   I reviewed nursing notes, hospitalist notes, last 24 h vitals and pain scores, last 48 h intake and output, last 24 h labs and trends, and last 24 h imaging results.  Resolved Hospital Problem list     Assessment & Plan:   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 Concern for infection around his G-tube Hypoalbuminemia Third spacing Bilateral pleural effusion s/p left and right thoracentesis -Cytology pending on both fluids -Both fluid exudative by LDH criteria  Continue empiric antibiotics  Chest x-ray postthoracentesis shows resolution of bilateral pleural effusions  Virl Diamond, MD [facility] Pager: See Loretha Stapler

## 2022-12-01 NOTE — Progress Notes (Signed)
PHARMACY - TOTAL PARENTERAL NUTRITION CONSULT NOTE   Indication: continuation of chronic home TPN  Patient Measurements: Height: 6' (182.9 cm) Weight: 76.4 kg (168 lb 6.9 oz) IBW/kg (Calculated) : 77.6 TPN AdjBW (KG): 79.1 Body mass index is 22.84 kg/m. Usual Weight: 75-85 kg  Assessment: 69 yoM with stage IV gastric adenocarcinoma and peritoneal metastasis, on chronic TPN at home. Does have J-G tube for feeding/venting, but has been unable to tolerate any jejunal feeding recently. Presents 9/6 with pleural effusions, abdominal distention and ascites, LE edema, and persistent leaking around J-tube insertion site which has worsened, and now appears purulent. 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 Noted to have stable chronic leukocytosis at baseline. Patient will be admitted for rule-out of sepsis; Pharmacy to continue TPN while admitted.  Pharmacist contacted home infusion pharmacist Jill Alexanders with [facility] [phone])  Patient Measurements: Estimated body mass index is 22.84 kg/m as calculated from the following:   Height as of this encounter: 6' (1.829 m).   Weight as of this encounter: 76.4 kg (168 lb 6.9 oz).  Intake/Output: Net I/O - 1083 UOP 2350 mls 9/6 S/p L  thoracentesis w/ 1700 mls out 9/6 lasix 100 mg total IV x 1; 9/7 lasix 60 IV x 1 9/6 albumin 25 gm q6h x 4 doses 9/7 s/p R thora w/ 900 cc out 9/7 s/p paracentesis w/ 1700 cc out  Estimated Nutritional Needs - Per RD recommendations on 11/30/22 kCal: 2400-2600 Protein: 110-130 grams Fluid: > 2.4 L  [facility] TPN formula:  2100 mls over 12 hours providing 2012 total Kcals, 115 gm protein Lytes per 24 hrs: Na 105: K 107; CL 110: ace 168, Phos 36.5; Ca 10, Mag 10  TPN @ 87.5 mls/hr will provide ~ 115.5 gm protein & ~ 2306 Kcals for 100% support  Recent Labs    11/30/22 0500 12/01/22 0414  NA 138 140  K 3.2* 3.4*  CL 100 105  CO2 24 25  GLUCOSE 103* 228*  BUN 40* 48*   CREATININE 0.69 0.95  CALCIUM 9.0 8.6*  PHOS 6.5* 3.1  MG 1.9 2.1  ALBUMIN 3.0* 2.4*  ALKPHOS 111 100  AST 26 35  ALT 18 25  BILITOT 2.8* 1.7*   Insulin Requirements: no hx DM, no insulin in TPN PTA.  CBGs 153 - 202 on TPN @ 87.5 ml/hr ( Goal < 180) 5 units SSI/24 hrs  Lytes: K 3.24 - after 4 runs given yesterday ( 6 runs ordered but unable to complete due to IV access issues)  phos down to 3.1 w/ none in TPN. All other WNL Renal:  BUN elevated at 48. SCr WNL 9/7 Lasix 60 x 1, completed 4 doses IV albumin LFTs: albumin low T bili 1.7 Current Nutrition: NPO, cyclic TPN PTA 9/8 TPN 87.5 ml/hr>> IVF: none Central access: PAC  TPN start date: PTA   PLAN  Now: 4 runs KCL via peripheral as has TPN running through PAC At 1800 today: continue TPN at 87.5 ml/hr. TPN @ 87.5 mls/hr will provide ~ 115.5 gm protein & ~ 2306 Kcals for 100% support Electrolytes in TPN: Standard: add back phos 15; increase K from 50 to 60, Cl:Ac ratio 1:1 TPN to contain standard multivitamins and trace elements. Change Very sensitive SSI q4h to sensitive SSI q4h TPN lab panels on Mondays & Thursdays.    Herby Abraham, Pharm.D Use secure chat for questions 12/01/2022 7:10 AM

## 2022-12-01 NOTE — Progress Notes (Signed)
Labs  Lab 11/28/22 1012 12/12/2022 1130 12/11/2022 1530 11/30/22 0500 12/01/22 0414  WBC 23.3* 23.5* 20.8* 18.1* 18.7*    Normocytic Anemia Anemia of chronic disease: Hemoglobin remains stable in 9 to 10 g Recent Labs  Lab 11/28/22 1012 12/23/2022 1130 12/16/2022 1530 11/30/22 0500 12/01/22 0414  HGB 10.6* 10.5* 10.0* 9.1* 10.1*  HCT 31.8* 32.8* 31.5* 28.2* 31.2*     Hypokalemia -replete w/ TPN  Oral Thrush Cont nystatin   T2DM: SSI Q4HR  DVT prophylaxis: enoxaparin (LOVENOX) injection 40 mg Start: 12/11/2022 2200 Code Status:   Code Status: Limited: Do not attempt resuscitation (DNR) -DNR-LIMITED -Do Not Intubate/DNI  Family Communication: plan of care discussed with patient/no family  at bedside. Patient status is: Inpatient because of pleural effusions symptomatic Level of care: Stepdown   Dispo: The patient is from: home            Anticipated disposition: TBD Objective: Vitals last 24 hrs: Vitals:   12/01/22 0800 12/01/22 0900 12/01/22 0941 12/01/22 1000  BP: 107/79 103/73  95/65  Pulse: (!) 122 (!) 119  (!) 116  Resp: (!) 23 15  17   Temp:   97.8 F (36.6 C)   TempSrc:   Oral   SpO2: 94% 94%  95%  Weight:      Height:       Weight change: -2.7 kg  Physical Examination: General exam: alert awake, older than stated age HEENT:Oral mucosa moist, Ear/Nose WNL grossly Respiratory system: bilaterally clear BS, no use of accessory muscle Cardiovascular system: S1 & S2 +, No JVD. Gastrointestinal system: Abdomen soft, J tube in place w/ leak, mildly tender, BS+ Nervous System:Alert, awake, moving extremities. Extremities: LE edema mild,distal peripheral pulses palpable.  Skin: No rashes,no icterus. MSK: Normal muscle bulk,tone, power  Medications reviewed:  Scheduled Meds:  Chlorhexidine Gluconate Cloth  6 each Topical Daily   enoxaparin (LOVENOX) injection  40 mg  Subcutaneous Q24H   insulin aspart  0-9 Units Subcutaneous Q4H   liver oil-zinc oxide   Topical TID   nystatin  5 mL Oral QID   mouth rinse  15 mL Mouth Rinse 4 times per day   sodium chloride flush  3 mL Intravenous Q12H   Continuous Infusions:  ceFEPime (MAXIPIME) IV Stopped (12/01/22 2536)   metronidazole Stopped (11/30/22 2251)   potassium chloride 100 mL/hr at 12/01/22 1016   TPN ADULT (ION) 87.5 mL/hr at 12/01/22 1016   TPN ADULT (ION)        Diet Order             Diet NPO time specified  Diet effective now                    Nutrition Problem: Inadequate oral intake 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, fistula) Signs/Symptoms: NPO status Interventions: Refer to RD note for recommendations, TPN   Intake/Output Summary (Last 24 hours) at 12/01/2022 1104 Last data filed at 12/01/2022 1001 Gross per 24 hour  Intake 2234.49 ml  Output 2650 ml  Net -415.51 ml   Net IO Since Admission: -2,137.24 mL [12/01/22 1104]  Wt Readings from Last 3 Encounters:  12/01/22 76.4 kg  11/21/22 80.5 kg  11/18/22 76.6 kg     Unresulted Labs (From admission, onward)     Start     Ordered   12/06/22 0500  Creatinine, serum  (enoxaparin (LOVENOX)    CrCl >/= 30 ml/min)  Weekly,   R     Comments:  Labs  Lab 11/28/22 1012 12/12/2022 1130 12/11/2022 1530 11/30/22 0500 12/01/22 0414  WBC 23.3* 23.5* 20.8* 18.1* 18.7*    Normocytic Anemia Anemia of chronic disease: Hemoglobin remains stable in 9 to 10 g Recent Labs  Lab 11/28/22 1012 12/23/2022 1130 12/16/2022 1530 11/30/22 0500 12/01/22 0414  HGB 10.6* 10.5* 10.0* 9.1* 10.1*  HCT 31.8* 32.8* 31.5* 28.2* 31.2*     Hypokalemia -replete w/ TPN  Oral Thrush Cont nystatin   T2DM: SSI Q4HR  DVT prophylaxis: enoxaparin (LOVENOX) injection 40 mg Start: 12/11/2022 2200 Code Status:   Code Status: Limited: Do not attempt resuscitation (DNR) -DNR-LIMITED -Do Not Intubate/DNI  Family Communication: plan of care discussed with patient/no family  at bedside. Patient status is: Inpatient because of pleural effusions symptomatic Level of care: Stepdown   Dispo: The patient is from: home            Anticipated disposition: TBD Objective: Vitals last 24 hrs: Vitals:   12/01/22 0800 12/01/22 0900 12/01/22 0941 12/01/22 1000  BP: 107/79 103/73  95/65  Pulse: (!) 122 (!) 119  (!) 116  Resp: (!) 23 15  17   Temp:   97.8 F (36.6 C)   TempSrc:   Oral   SpO2: 94% 94%  95%  Weight:      Height:       Weight change: -2.7 kg  Physical Examination: General exam: alert awake, older than stated age HEENT:Oral mucosa moist, Ear/Nose WNL grossly Respiratory system: bilaterally clear BS, no use of accessory muscle Cardiovascular system: S1 & S2 +, No JVD. Gastrointestinal system: Abdomen soft, J tube in place w/ leak, mildly tender, BS+ Nervous System:Alert, awake, moving extremities. Extremities: LE edema mild,distal peripheral pulses palpable.  Skin: No rashes,no icterus. MSK: Normal muscle bulk,tone, power  Medications reviewed:  Scheduled Meds:  Chlorhexidine Gluconate Cloth  6 each Topical Daily   enoxaparin (LOVENOX) injection  40 mg  Subcutaneous Q24H   insulin aspart  0-9 Units Subcutaneous Q4H   liver oil-zinc oxide   Topical TID   nystatin  5 mL Oral QID   mouth rinse  15 mL Mouth Rinse 4 times per day   sodium chloride flush  3 mL Intravenous Q12H   Continuous Infusions:  ceFEPime (MAXIPIME) IV Stopped (12/01/22 2536)   metronidazole Stopped (11/30/22 2251)   potassium chloride 100 mL/hr at 12/01/22 1016   TPN ADULT (ION) 87.5 mL/hr at 12/01/22 1016   TPN ADULT (ION)        Diet Order             Diet NPO time specified  Diet effective now                    Nutrition Problem: Inadequate oral intake 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, fistula) Signs/Symptoms: NPO status Interventions: Refer to RD note for recommendations, TPN   Intake/Output Summary (Last 24 hours) at 12/01/2022 1104 Last data filed at 12/01/2022 1001 Gross per 24 hour  Intake 2234.49 ml  Output 2650 ml  Net -415.51 ml   Net IO Since Admission: -2,137.24 mL [12/01/22 1104]  Wt Readings from Last 3 Encounters:  12/01/22 76.4 kg  11/21/22 80.5 kg  11/18/22 76.6 kg     Unresulted Labs (From admission, onward)     Start     Ordered   12/06/22 0500  Creatinine, serum  (enoxaparin (LOVENOX)    CrCl >/= 30 ml/min)  Weekly,   R     Comments:  Labs  Lab 11/28/22 1012 12/12/2022 1130 12/11/2022 1530 11/30/22 0500 12/01/22 0414  WBC 23.3* 23.5* 20.8* 18.1* 18.7*    Normocytic Anemia Anemia of chronic disease: Hemoglobin remains stable in 9 to 10 g Recent Labs  Lab 11/28/22 1012 12/23/2022 1130 12/16/2022 1530 11/30/22 0500 12/01/22 0414  HGB 10.6* 10.5* 10.0* 9.1* 10.1*  HCT 31.8* 32.8* 31.5* 28.2* 31.2*     Hypokalemia -replete w/ TPN  Oral Thrush Cont nystatin   T2DM: SSI Q4HR  DVT prophylaxis: enoxaparin (LOVENOX) injection 40 mg Start: 12/11/2022 2200 Code Status:   Code Status: Limited: Do not attempt resuscitation (DNR) -DNR-LIMITED -Do Not Intubate/DNI  Family Communication: plan of care discussed with patient/no family  at bedside. Patient status is: Inpatient because of pleural effusions symptomatic Level of care: Stepdown   Dispo: The patient is from: home            Anticipated disposition: TBD Objective: Vitals last 24 hrs: Vitals:   12/01/22 0800 12/01/22 0900 12/01/22 0941 12/01/22 1000  BP: 107/79 103/73  95/65  Pulse: (!) 122 (!) 119  (!) 116  Resp: (!) 23 15  17   Temp:   97.8 F (36.6 C)   TempSrc:   Oral   SpO2: 94% 94%  95%  Weight:      Height:       Weight change: -2.7 kg  Physical Examination: General exam: alert awake, older than stated age HEENT:Oral mucosa moist, Ear/Nose WNL grossly Respiratory system: bilaterally clear BS, no use of accessory muscle Cardiovascular system: S1 & S2 +, No JVD. Gastrointestinal system: Abdomen soft, J tube in place w/ leak, mildly tender, BS+ Nervous System:Alert, awake, moving extremities. Extremities: LE edema mild,distal peripheral pulses palpable.  Skin: No rashes,no icterus. MSK: Normal muscle bulk,tone, power  Medications reviewed:  Scheduled Meds:  Chlorhexidine Gluconate Cloth  6 each Topical Daily   enoxaparin (LOVENOX) injection  40 mg  Subcutaneous Q24H   insulin aspart  0-9 Units Subcutaneous Q4H   liver oil-zinc oxide   Topical TID   nystatin  5 mL Oral QID   mouth rinse  15 mL Mouth Rinse 4 times per day   sodium chloride flush  3 mL Intravenous Q12H   Continuous Infusions:  ceFEPime (MAXIPIME) IV Stopped (12/01/22 2536)   metronidazole Stopped (11/30/22 2251)   potassium chloride 100 mL/hr at 12/01/22 1016   TPN ADULT (ION) 87.5 mL/hr at 12/01/22 1016   TPN ADULT (ION)        Diet Order             Diet NPO time specified  Diet effective now                    Nutrition Problem: Inadequate oral intake 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, fistula) Signs/Symptoms: NPO status Interventions: Refer to RD note for recommendations, TPN   Intake/Output Summary (Last 24 hours) at 12/01/2022 1104 Last data filed at 12/01/2022 1001 Gross per 24 hour  Intake 2234.49 ml  Output 2650 ml  Net -415.51 ml   Net IO Since Admission: -2,137.24 mL [12/01/22 1104]  Wt Readings from Last 3 Encounters:  12/01/22 76.4 kg  11/21/22 80.5 kg  11/18/22 76.6 kg     Unresulted Labs (From admission, onward)     Start     Ordered   12/06/22 0500  Creatinine, serum  (enoxaparin (LOVENOX)    CrCl >/= 30 ml/min)  Weekly,   R     Comments:  PROGRESS NOTE Steven Wolf  WJX:[id number] DOB: 28-Mar-1955 DOA: 12/02/2022 PCP: Pincus Sanes, MD  Brief Narrative/Hospital Course: 71 yom who is chronically ill-appearing w/ pmh of dyslipidemia, hypertension, BPH, diabetes mellitus type 2, struct of sleep apnea on a CPAP,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  Recent PET scan 11/28/2022-showed new findings of hypermetabolic nodule metastasis and probably progressive peritoneal carcinomatosis along with increased ascites although peritonitis could have similar finding. He was also noted to have bilateral new pleural effusions which was worse on the left compared to right.  Chest x-ray showed pleural effusion, patient was admitted for further management Underwent thoracentesis 1.7 L from left and 0.9 from right. Pulmonary critical care following Neurosurgery consulted given his J tube excoriation and drainage and they are recommending possibly trying an ostomy pouch around the J-tube to control the leakage better. WOC nurse has been consulted .Surgery does not feel that he needs surgical intervention at this time and recommends further goals of care and palliative goals of care discussion and that if he focuses more on comfort focused measures and it is reasonable to remove the J-tube. 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  Patient leaning towards comfort measures/hospice pending further discussion with primary oncologist on Monday palliative care following continue DNR. Cont pain control w/ morphine prn.  Shortness of breath Pleural effusion bilateral Peripheral edema Hypoalbuminemia with third spacing: Workup with negative for COVID RSV influenza.Received IV albumin plus Lasix. Patient symptomatic with shortness of breath in the setting of pleural effusion with hypoalbuminemia, PCCM following, underwent thoracentesis 1.7 L from left and 0.9 from right.>  Gram stain no organism culture pending.  TTE, LV grossly normal function, LE duplex pending  Continue empiric antibiotics,bronchodilators, lasix prn.  Follow-up cytology on both pleural fluid   Suspected sepsis initially Leukocytosis Suspect chronic leukocytosis, patient is afebrile, although procalcitonin borderline 0.6. Concern for peritonitis, possible infection around his J-tube, Patient getting empiric cefepime.  Follow-up pleural fluid culture.  Blood culture from 9/6 no growth so far.  UA on admission WBC 85 unremarkable. WBC count downtrending. Got Paracentesis and send for Fluid Analysis as yielded 1.7 L of clear yellow fluid from the right lateral abdomen however during the procedure patient had a leaking J-tube which appeared to be bilious and dark green that was inconsistent with typical appearance of ascitic fluid however it appeared that this was gastric contents from around his tract and the interventional radiology team recommended reducing gauze underneath the bumper and change -Patient was tachypneic, tachycardic and had a  leukocytosis on admission -Procalcitonin level was 0.66 and WBCs are trending down Recent  26 35  ALT 13 18  --  18 25  ALKPHOS 111 119  --  111 100  BILITOT 1.4*  1.7*  --  2.8* 1.7*  PROT 6.4* 6.2* 6.1* 6.2* 5.5*  ALBUMIN 2.8* 2.0*  --  3.0* 2.4*  No results for input(s): "LIPASE", "AMYLASE" in the last 168 hours. No results for input(s): "AMMONIA" in the last 168 hours. Coagulation Profile: Recent Labs  Lab 12/09/2022 1130  INR 1.2   Recent Labs  Lab 11/30/22 1154 11/30/22 2010 11/30/22 2336 12/01/22 0341 12/01/22 0754  GLUCAP 95 153* 177* 194* 202*   Recent Labs  Lab 12/22/2022 1228 11/30/22 1123  PROCALCITON  --  0.66  LATICACIDVEN 1.8  --     Recent Results (from the past 240 hour(s))  Blood culture (routine x 2)     Status: None (Preliminary result)   Collection Time: 12/18/2022 11:20 AM   Specimen: BLOOD RIGHT HAND  Result Value Ref Range Status   Specimen Description   Final    BLOOD RIGHT HAND Performed at [facility], [address] [place], [place] [address]    Special Requests   Final    BOTTLES DRAWN AEROBIC AND ANAEROBIC Blood Culture adequate volume Performed at [facility], [address], [place], [place] [address]    Culture   Final    NO GROWTH 2 DAYS Performed at [facility], [address] [place], [place] [address]    Report Status PENDING  Incomplete  Blood culture (routine x 2)     Status: None (Preliminary result)   Collection Time: 12/05/2022 11:20 AM   Specimen: BLOOD  Result Value Ref Range Status   Specimen Description   Final    BLOOD LEFT ANTECUBITAL Performed at [facility], [address], [place], [place] [address]    Special Requests   Final    BOTTLES DRAWN AEROBIC AND ANAEROBIC Blood Culture adequate volume Performed at [facility], [address], [place], [place] [address]    Culture   Final    NO GROWTH 2 DAYS Performed at [facility], [address], [place], [place] [address]    Report Status PENDING  Incomplete  Resp panel by RT-PCR (RSV, Flu A&B, Covid) Anterior Nasal Swab     Status: None    Collection Time: 11/24/2022 12:00 PM   Specimen: Anterior Nasal Swab  Result Value Ref Range Status   SARS Coronavirus 2 by RT PCR NEGATIVE NEGATIVE Final    Comment: (NOTE) SARS-CoV-2 target nucleic acids are NOT DETECTED.  The SARS-CoV-2 RNA is generally detectable in upper respiratory specimens during the acute phase of infection. The lowest concentration of SARS-CoV-2 viral copies this assay can detect is 138 copies/mL. A negative result does not preclude SARS-Cov-2 infection and should not be used as the sole basis for treatment or other patient management decisions. A negative result may occur with  improper specimen collection/handling, submission of specimen other than nasopharyngeal swab, presence of viral mutation(s) within the areas targeted by this assay, and inadequate number of viral copies(<138 copies/mL). A negative result must be combined with clinical observations, patient history, and epidemiological information. The expected result is Negative.  Fact Sheet for Patients:  BloggerCourse.com  Fact Sheet for Healthcare Providers:  SeriousBroker.it  This test is no t yet approved or cleared by the [place] FDA and  has been authorized for detection and/or diagnosis of SARS-CoV-2 by FDA under  Labs  Lab 11/28/22 1012 12/12/2022 1130 12/11/2022 1530 11/30/22 0500 12/01/22 0414  WBC 23.3* 23.5* 20.8* 18.1* 18.7*    Normocytic Anemia Anemia of chronic disease: Hemoglobin remains stable in 9 to 10 g Recent Labs  Lab 11/28/22 1012 12/23/2022 1130 12/16/2022 1530 11/30/22 0500 12/01/22 0414  HGB 10.6* 10.5* 10.0* 9.1* 10.1*  HCT 31.8* 32.8* 31.5* 28.2* 31.2*     Hypokalemia -replete w/ TPN  Oral Thrush Cont nystatin   T2DM: SSI Q4HR  DVT prophylaxis: enoxaparin (LOVENOX) injection 40 mg Start: 12/11/2022 2200 Code Status:   Code Status: Limited: Do not attempt resuscitation (DNR) -DNR-LIMITED -Do Not Intubate/DNI  Family Communication: plan of care discussed with patient/no family  at bedside. Patient status is: Inpatient because of pleural effusions symptomatic Level of care: Stepdown   Dispo: The patient is from: home            Anticipated disposition: TBD Objective: Vitals last 24 hrs: Vitals:   12/01/22 0800 12/01/22 0900 12/01/22 0941 12/01/22 1000  BP: 107/79 103/73  95/65  Pulse: (!) 122 (!) 119  (!) 116  Resp: (!) 23 15  17   Temp:   97.8 F (36.6 C)   TempSrc:   Oral   SpO2: 94% 94%  95%  Weight:      Height:       Weight change: -2.7 kg  Physical Examination: General exam: alert awake, older than stated age HEENT:Oral mucosa moist, Ear/Nose WNL grossly Respiratory system: bilaterally clear BS, no use of accessory muscle Cardiovascular system: S1 & S2 +, No JVD. Gastrointestinal system: Abdomen soft, J tube in place w/ leak, mildly tender, BS+ Nervous System:Alert, awake, moving extremities. Extremities: LE edema mild,distal peripheral pulses palpable.  Skin: No rashes,no icterus. MSK: Normal muscle bulk,tone, power  Medications reviewed:  Scheduled Meds:  Chlorhexidine Gluconate Cloth  6 each Topical Daily   enoxaparin (LOVENOX) injection  40 mg  Subcutaneous Q24H   insulin aspart  0-9 Units Subcutaneous Q4H   liver oil-zinc oxide   Topical TID   nystatin  5 mL Oral QID   mouth rinse  15 mL Mouth Rinse 4 times per day   sodium chloride flush  3 mL Intravenous Q12H   Continuous Infusions:  ceFEPime (MAXIPIME) IV Stopped (12/01/22 2536)   metronidazole Stopped (11/30/22 2251)   potassium chloride 100 mL/hr at 12/01/22 1016   TPN ADULT (ION) 87.5 mL/hr at 12/01/22 1016   TPN ADULT (ION)        Diet Order             Diet NPO time specified  Diet effective now                    Nutrition Problem: Inadequate oral intake 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, fistula) Signs/Symptoms: NPO status Interventions: Refer to RD note for recommendations, TPN   Intake/Output Summary (Last 24 hours) at 12/01/2022 1104 Last data filed at 12/01/2022 1001 Gross per 24 hour  Intake 2234.49 ml  Output 2650 ml  Net -415.51 ml   Net IO Since Admission: -2,137.24 mL [12/01/22 1104]  Wt Readings from Last 3 Encounters:  12/01/22 76.4 kg  11/21/22 80.5 kg  11/18/22 76.6 kg     Unresulted Labs (From admission, onward)     Start     Ordered   12/06/22 0500  Creatinine, serum  (enoxaparin (LOVENOX)    CrCl >/= 30 ml/min)  Weekly,   R     Comments:  26 35  ALT 13 18  --  18 25  ALKPHOS 111 119  --  111 100  BILITOT 1.4*  1.7*  --  2.8* 1.7*  PROT 6.4* 6.2* 6.1* 6.2* 5.5*  ALBUMIN 2.8* 2.0*  --  3.0* 2.4*  No results for input(s): "LIPASE", "AMYLASE" in the last 168 hours. No results for input(s): "AMMONIA" in the last 168 hours. Coagulation Profile: Recent Labs  Lab 12/09/2022 1130  INR 1.2   Recent Labs  Lab 11/30/22 1154 11/30/22 2010 11/30/22 2336 12/01/22 0341 12/01/22 0754  GLUCAP 95 153* 177* 194* 202*   Recent Labs  Lab 12/22/2022 1228 11/30/22 1123  PROCALCITON  --  0.66  LATICACIDVEN 1.8  --     Recent Results (from the past 240 hour(s))  Blood culture (routine x 2)     Status: None (Preliminary result)   Collection Time: 12/18/2022 11:20 AM   Specimen: BLOOD RIGHT HAND  Result Value Ref Range Status   Specimen Description   Final    BLOOD RIGHT HAND Performed at [facility], [address] [place], [place] [address]    Special Requests   Final    BOTTLES DRAWN AEROBIC AND ANAEROBIC Blood Culture adequate volume Performed at [facility], [address], [place], [place] [address]    Culture   Final    NO GROWTH 2 DAYS Performed at [facility], [address] [place], [place] [address]    Report Status PENDING  Incomplete  Blood culture (routine x 2)     Status: None (Preliminary result)   Collection Time: 12/05/2022 11:20 AM   Specimen: BLOOD  Result Value Ref Range Status   Specimen Description   Final    BLOOD LEFT ANTECUBITAL Performed at [facility], [address], [place], [place] [address]    Special Requests   Final    BOTTLES DRAWN AEROBIC AND ANAEROBIC Blood Culture adequate volume Performed at [facility], [address], [place], [place] [address]    Culture   Final    NO GROWTH 2 DAYS Performed at [facility], [address], [place], [place] [address]    Report Status PENDING  Incomplete  Resp panel by RT-PCR (RSV, Flu A&B, Covid) Anterior Nasal Swab     Status: None    Collection Time: 11/24/2022 12:00 PM   Specimen: Anterior Nasal Swab  Result Value Ref Range Status   SARS Coronavirus 2 by RT PCR NEGATIVE NEGATIVE Final    Comment: (NOTE) SARS-CoV-2 target nucleic acids are NOT DETECTED.  The SARS-CoV-2 RNA is generally detectable in upper respiratory specimens during the acute phase of infection. The lowest concentration of SARS-CoV-2 viral copies this assay can detect is 138 copies/mL. A negative result does not preclude SARS-Cov-2 infection and should not be used as the sole basis for treatment or other patient management decisions. A negative result may occur with  improper specimen collection/handling, submission of specimen other than nasopharyngeal swab, presence of viral mutation(s) within the areas targeted by this assay, and inadequate number of viral copies(<138 copies/mL). A negative result must be combined with clinical observations, patient history, and epidemiological information. The expected result is Negative.  Fact Sheet for Patients:  BloggerCourse.com  Fact Sheet for Healthcare Providers:  SeriousBroker.it  This test is no t yet approved or cleared by the [place] FDA and  has been authorized for detection and/or diagnosis of SARS-CoV-2 by FDA under  26 35  ALT 13 18  --  18 25  ALKPHOS 111 119  --  111 100  BILITOT 1.4*  1.7*  --  2.8* 1.7*  PROT 6.4* 6.2* 6.1* 6.2* 5.5*  ALBUMIN 2.8* 2.0*  --  3.0* 2.4*  No results for input(s): "LIPASE", "AMYLASE" in the last 168 hours. No results for input(s): "AMMONIA" in the last 168 hours. Coagulation Profile: Recent Labs  Lab 12/09/2022 1130  INR 1.2   Recent Labs  Lab 11/30/22 1154 11/30/22 2010 11/30/22 2336 12/01/22 0341 12/01/22 0754  GLUCAP 95 153* 177* 194* 202*   Recent Labs  Lab 12/22/2022 1228 11/30/22 1123  PROCALCITON  --  0.66  LATICACIDVEN 1.8  --     Recent Results (from the past 240 hour(s))  Blood culture (routine x 2)     Status: None (Preliminary result)   Collection Time: 12/18/2022 11:20 AM   Specimen: BLOOD RIGHT HAND  Result Value Ref Range Status   Specimen Description   Final    BLOOD RIGHT HAND Performed at [facility], [address] [place], [place] [address]    Special Requests   Final    BOTTLES DRAWN AEROBIC AND ANAEROBIC Blood Culture adequate volume Performed at [facility], [address], [place], [place] [address]    Culture   Final    NO GROWTH 2 DAYS Performed at [facility], [address] [place], [place] [address]    Report Status PENDING  Incomplete  Blood culture (routine x 2)     Status: None (Preliminary result)   Collection Time: 12/05/2022 11:20 AM   Specimen: BLOOD  Result Value Ref Range Status   Specimen Description   Final    BLOOD LEFT ANTECUBITAL Performed at [facility], [address], [place], [place] [address]    Special Requests   Final    BOTTLES DRAWN AEROBIC AND ANAEROBIC Blood Culture adequate volume Performed at [facility], [address], [place], [place] [address]    Culture   Final    NO GROWTH 2 DAYS Performed at [facility], [address], [place], [place] [address]    Report Status PENDING  Incomplete  Resp panel by RT-PCR (RSV, Flu A&B, Covid) Anterior Nasal Swab     Status: None    Collection Time: 11/24/2022 12:00 PM   Specimen: Anterior Nasal Swab  Result Value Ref Range Status   SARS Coronavirus 2 by RT PCR NEGATIVE NEGATIVE Final    Comment: (NOTE) SARS-CoV-2 target nucleic acids are NOT DETECTED.  The SARS-CoV-2 RNA is generally detectable in upper respiratory specimens during the acute phase of infection. The lowest concentration of SARS-CoV-2 viral copies this assay can detect is 138 copies/mL. A negative result does not preclude SARS-Cov-2 infection and should not be used as the sole basis for treatment or other patient management decisions. A negative result may occur with  improper specimen collection/handling, submission of specimen other than nasopharyngeal swab, presence of viral mutation(s) within the areas targeted by this assay, and inadequate number of viral copies(<138 copies/mL). A negative result must be combined with clinical observations, patient history, and epidemiological information. The expected result is Negative.  Fact Sheet for Patients:  BloggerCourse.com  Fact Sheet for Healthcare Providers:  SeriousBroker.it  This test is no t yet approved or cleared by the [place] FDA and  has been authorized for detection and/or diagnosis of SARS-CoV-2 by FDA under  Labs  Lab 11/28/22 1012 12/12/2022 1130 12/11/2022 1530 11/30/22 0500 12/01/22 0414  WBC 23.3* 23.5* 20.8* 18.1* 18.7*    Normocytic Anemia Anemia of chronic disease: Hemoglobin remains stable in 9 to 10 g Recent Labs  Lab 11/28/22 1012 12/23/2022 1130 12/16/2022 1530 11/30/22 0500 12/01/22 0414  HGB 10.6* 10.5* 10.0* 9.1* 10.1*  HCT 31.8* 32.8* 31.5* 28.2* 31.2*     Hypokalemia -replete w/ TPN  Oral Thrush Cont nystatin   T2DM: SSI Q4HR  DVT prophylaxis: enoxaparin (LOVENOX) injection 40 mg Start: 12/11/2022 2200 Code Status:   Code Status: Limited: Do not attempt resuscitation (DNR) -DNR-LIMITED -Do Not Intubate/DNI  Family Communication: plan of care discussed with patient/no family  at bedside. Patient status is: Inpatient because of pleural effusions symptomatic Level of care: Stepdown   Dispo: The patient is from: home            Anticipated disposition: TBD Objective: Vitals last 24 hrs: Vitals:   12/01/22 0800 12/01/22 0900 12/01/22 0941 12/01/22 1000  BP: 107/79 103/73  95/65  Pulse: (!) 122 (!) 119  (!) 116  Resp: (!) 23 15  17   Temp:   97.8 F (36.6 C)   TempSrc:   Oral   SpO2: 94% 94%  95%  Weight:      Height:       Weight change: -2.7 kg  Physical Examination: General exam: alert awake, older than stated age HEENT:Oral mucosa moist, Ear/Nose WNL grossly Respiratory system: bilaterally clear BS, no use of accessory muscle Cardiovascular system: S1 & S2 +, No JVD. Gastrointestinal system: Abdomen soft, J tube in place w/ leak, mildly tender, BS+ Nervous System:Alert, awake, moving extremities. Extremities: LE edema mild,distal peripheral pulses palpable.  Skin: No rashes,no icterus. MSK: Normal muscle bulk,tone, power  Medications reviewed:  Scheduled Meds:  Chlorhexidine Gluconate Cloth  6 each Topical Daily   enoxaparin (LOVENOX) injection  40 mg  Subcutaneous Q24H   insulin aspart  0-9 Units Subcutaneous Q4H   liver oil-zinc oxide   Topical TID   nystatin  5 mL Oral QID   mouth rinse  15 mL Mouth Rinse 4 times per day   sodium chloride flush  3 mL Intravenous Q12H   Continuous Infusions:  ceFEPime (MAXIPIME) IV Stopped (12/01/22 2536)   metronidazole Stopped (11/30/22 2251)   potassium chloride 100 mL/hr at 12/01/22 1016   TPN ADULT (ION) 87.5 mL/hr at 12/01/22 1016   TPN ADULT (ION)        Diet Order             Diet NPO time specified  Diet effective now                    Nutrition Problem: Inadequate oral intake 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, fistula) Signs/Symptoms: NPO status Interventions: Refer to RD note for recommendations, TPN   Intake/Output Summary (Last 24 hours) at 12/01/2022 1104 Last data filed at 12/01/2022 1001 Gross per 24 hour  Intake 2234.49 ml  Output 2650 ml  Net -415.51 ml   Net IO Since Admission: -2,137.24 mL [12/01/22 1104]  Wt Readings from Last 3 Encounters:  12/01/22 76.4 kg  11/21/22 80.5 kg  11/18/22 76.6 kg     Unresulted Labs (From admission, onward)     Start     Ordered   12/06/22 0500  Creatinine, serum  (enoxaparin (LOVENOX)    CrCl >/= 30 ml/min)  Weekly,   R     Comments:

## 2022-12-01 NOTE — Progress Notes (Signed)
Daily Progress Note   Patient Name: Steven Wolf       Date: 12/01/2022 DOB: [birth year]  Age: 67 y.o. MRN#: [id number] Attending Physician: Lanae Boast, MD Primary Care Physician: Pincus Sanes, MD Admit Date: December 09, 2022  Reason for Consultation/Follow-up: Establishing goals of care  Subjective: Patient states that he continues to have skin irritation from leaking G-tube.  Wound and ostomy nurses are also following for irritant contact dermatitis.  They are applying thicker moisture barrier ointment and have advised the use of silicone foam used as split gauze for drainage and tube stabilization. Patient is awake alert resting in bed Wife currently not at bedside Re discussed goals of care with patient-see below   Length of Stay: 2  Current Medications: Scheduled Meds:   Chlorhexidine Gluconate Cloth  6 each Topical Daily   enoxaparin (LOVENOX) injection  40 mg Subcutaneous Q24H   insulin aspart  0-9 Units Subcutaneous Q4H   nystatin  5 mL Oral QID   mouth rinse  15 mL Mouth Rinse 4 times per day   sodium chloride flush  3 mL Intravenous Q12H    Continuous Infusions:  ceFEPime (MAXIPIME) IV Stopped (12/01/22 4098)   metronidazole Stopped (12/01/22 1220)   TPN ADULT (ION) 87.5 mL/hr at 12/01/22 1016   TPN ADULT (ION)      PRN Meds: acetaminophen **OR** acetaminophen, ipratropium, levalbuterol, morphine injection, ondansetron **OR** ondansetron (ZOFRAN) IV, mouth rinse, Zinc Oxide  Physical Exam         Awake alert Sitting up in bed Appears with generalized weakness Monitor noted S1-S2 Has J-tube Some lower extremity edema No focal deficits Oriented  Vital Signs: BP 102/69   Pulse (!) 119   Temp (!) 97.5 F (36.4 C) (Oral)   Resp 16   Ht 6' (1.829 m)   Wt 76.4 kg    SpO2 94%   BMI 22.84 kg/m  SpO2: SpO2: 94 % O2 Device: O2 Device: Room Air O2 Flow Rate:    Intake/output summary:  Intake/Output Summary (Last 24 hours) at 12/01/2022 1517 Last data filed at 12/01/2022 1001 Gross per 24 hour  Intake 1903.05 ml  Output 725 ml  Net 1178.05 ml   LBM: Last BM Date :  (PTA) Baseline Weight: Weight: 79.1 kg Most recent weight: Weight: 76.4 kg  Palliative Assessment/Data:      Patient Active Problem List   Diagnosis Date Noted   Sepsis (HCC) 2022-12-25   Port-A-Cath in place 11/21/2022   Medication management 10/23/2022   Palliative care encounter 10/22/2022   Nausea and vomiting 10/22/2022   Constipation 10/22/2022   Cancer associated pain 10/22/2022   Need for emotional support 10/22/2022   Goals of care, counseling/discussion 10/22/2022   High risk medication use 10/22/2022   Protein-calorie malnutrition, severe 10/17/2022   Gastric outlet obstruction 10/15/2022   Partial gastric outlet obstruction 10/14/2022   Adenocarcinoma of stomach (HCC) 10/01/2022   Abnormal weight loss 09/30/2022   Neoplasm of digestive system 09/30/2022   GERD (gastroesophageal reflux disease) 08/13/2022   Agatston coronary artery calcium score of 0,    2023 07/27/2021   Other allergic rhinitis 12/28/2019   Drug reaction 12/28/2019   Pruritus 12/13/2019   Pain in finger of left hand 11/13/2018   OSA on CPAP 06/28/2016   Anxiety 04/07/2015   Diabetes (HCC) 11/11/2013   DIVERTICULOSIS, COLON 08/11/2009   BPH (benign prostatic hyperplasia) 06/15/2008   HEMATURIA 05/06/2008   Hyperlipidemia 09/10/2007   Essential hypertension, benign 04/17/2007   COLONIC POLYPS, HX OF 04/17/2007   SYNCOPE 04/03/2007    Palliative Care Assessment & Plan   Patient Profile:    Assessment: 67 year old gentleman who used to work as an Art gallery manager, diagnosis of 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 With multiple  hospitalizations, now receiving TPN, J-tube in place but having a lot of leakage around the G-tube and not being utilized for enteral feeds. Also has J-tube excoriation and drainage. 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  Remains DNR.  Remains on TPN for now.  He wishes to discuss with medical oncology further.  Patient asks today about full scope of comfort measures and what care inside a residential hospice facility looks like.  Described to him about comfort-focused care and the type of care that is given at hospice house.  He states that his wife is having a lot of caregiver burden and that he will discuss with oncology about having J-tube removed.  [facility] will continue to follow along and be available for further goals of care discussions.     Code Status:    Code Status Orders  (From admission, onward)           Start     Ordered   12/25/2022 1639  Do not attempt resuscitation (DNR)- Limited -Do Not Intubate (DNI)  (Code Status)  Continuous       Question Answer Comment  If pulseless and not breathing No CPR or chest compressions.   In Pre-Arrest Conditions (Patient Is Breathing and Has A Pulse) Do not intubate. Provide all appropriate non-invasive medical interventions. Avoid ICU transfer unless indicated or required.   Consent: Discussion documented in EHR or advanced directives reviewed      2022/12/25 1639           Code Status History     Date Active Date Inactive Code Status Order ID Comments User Context   12/25/22 1514 December 25, 2022 1639 Do not attempt resuscitation (DNR) PRE-ARREST INTERVENTIONS DESIRED [id number]  Russella Dar, NP ED   12-25-2022 1509  25-Dec-2022 1514 Do not attempt  resuscitation (DNR) PRE-ARREST INTERVENTIONS DESIRED [id number]  Russella Dar, NP ED   10/14/2022 1635 11/03/2022 1743 Full Code [id number]  Maryln Gottron, MD Inpatient      Advance Directive Documentation    Flowsheet Row Most Recent Value  Type of Advance Directive Healthcare Power of Attorney, Living will  Pre-existing out of facility DNR order (yellow form or pink MOST form) --  "MOST" Form in Place? --       Prognosis:  Unable to determine  Discharge Planning: To Be Determined  Care plan was discussed with patient in detail.  Thank you for allowing the Palliative Medicine Team to assist in the care of this patient.  Mod MDM.      Greater than 50%  of this time was spent counseling and coordinating care related to the above assessment and plan.  Rosalin Hawking, MD  Please contact Palliative Medicine Team phone at [phone] for questions and concerns.

## 2022-12-01 NOTE — Progress Notes (Deleted)
Lower extremity venous duplex completed. Please see CV Procedures for preliminary results.  Shona Simpson, RVT 12/01/22 12:06 PM

## 2022-12-01 NOTE — Consult Note (Signed)
WOC Nurse Consult Note: Reason for Consult: skin irritation from leaking Gtube Wound type: irritant contact dermatitis ICD-10 CM Codes for Irritant Dermatitis L24B1 - Related to digestive stoma or fistula Pressure Injury POA: NA Measurement:NA Wound bed: red Drainage (amount, consistency, odor) moderate, green (gastric) Periwound:intact  Dressing procedure/placement/frequency: Added thicker moisture barrier ointment and instructions for the use of silicone foam used as split gauze for drainage and tube stabilization.   Re consult if needed, will not follow at this time. Thanks  Daneya Hartgrove [employer], RN,CWOCN, CNS, CWON-AP [phone])

## 2022-12-01 NOTE — Plan of Care (Signed)

## 2022-12-01 NOTE — Progress Notes (Signed)
Lower extremity venous duplex completed. Please see CV Procedures for preliminary results.  Shona Simpson, RVT 12/01/22 11:42 AM

## 2022-12-02 ENCOUNTER — Other Ambulatory Visit: Payer: Self-pay

## 2022-12-02 DIAGNOSIS — C168 Malignant neoplasm of overlapping sites of stomach: Secondary | ICD-10-CM

## 2022-12-02 DIAGNOSIS — Z515 Encounter for palliative care: Secondary | ICD-10-CM | POA: Diagnosis not present

## 2022-12-02 DIAGNOSIS — R0602 Shortness of breath: Principal | ICD-10-CM

## 2022-12-02 DIAGNOSIS — J90 Pleural effusion, not elsewhere classified: Secondary | ICD-10-CM | POA: Diagnosis not present

## 2022-12-02 DIAGNOSIS — A419 Sepsis, unspecified organism: Secondary | ICD-10-CM | POA: Diagnosis not present

## 2022-12-02 LAB — TRIGLYCERIDES: Triglycerides: 133 mg/dL (ref <150)

## 2022-12-02 LAB — GLUCOSE, CAPILLARY
Glucose-Capillary: 106 mg/dL — ABNORMAL HIGH (ref 70–99)
Glucose-Capillary: 165 mg/dL — ABNORMAL HIGH (ref 70–99)
Glucose-Capillary: 173 mg/dL — ABNORMAL HIGH (ref 70–99)
Glucose-Capillary: 182 mg/dL — ABNORMAL HIGH (ref 70–99)
Glucose-Capillary: 188 mg/dL — ABNORMAL HIGH (ref 70–99)
Glucose-Capillary: 204 mg/dL — ABNORMAL HIGH (ref 70–99)
Glucose-Capillary: 208 mg/dL — ABNORMAL HIGH (ref 70–99)

## 2022-12-02 LAB — PHOSPHORUS: Phosphorus: 2.3 mg/dL — ABNORMAL LOW (ref 2.5–4.6)

## 2022-12-02 LAB — COMPREHENSIVE METABOLIC PANEL
ALT: 28 U/L (ref 0–44)
AST: 29 U/L (ref 15–41)
Albumin: 2.1 g/dL — ABNORMAL LOW (ref 3.5–5.0)
Alkaline Phosphatase: 112 U/L (ref 38–126)
Anion gap: 8 (ref 5–15)
BUN: 60 mg/dL — ABNORMAL HIGH (ref 8–23)
CO2: 24 mmol/L (ref 22–32)
Calcium: 8.8 mg/dL — ABNORMAL LOW (ref 8.9–10.3)
Chloride: 107 mmol/L (ref 98–111)
Creatinine, Ser: 0.87 mg/dL (ref 0.61–1.24)
GFR, Estimated: >60 mL/min (ref >60)
Glucose, Bld: 214 mg/dL — ABNORMAL HIGH (ref 70–99)
Potassium: 4.5 mmol/L (ref 3.5–5.1)
Sodium: 139 mmol/L (ref 135–145)
Total Bilirubin: 1.1 mg/dL (ref 0.3–1.2)
Total Protein: 5.4 g/dL — ABNORMAL LOW (ref 6.5–8.1)

## 2022-12-02 LAB — MAGNESIUM: Magnesium: 2.3 mg/dL (ref 1.7–2.4)

## 2022-12-02 MED ORDER — CARMEX CLASSIC LIP BALM EX OINT: TOPICAL_OINTMENT | CUTANEOUS | Status: DC | PRN

## 2022-12-02 MED ORDER — SODIUM CHLORIDE 0.9% FLUSH: 10.0000 mL | INTRAVENOUS | Status: DC | PRN

## 2022-12-02 MED ORDER — INSULIN ASPART 100 UNIT/ML IJ SOLN
0.0000 [IU] | INTRAMUSCULAR | Status: DC
  Administered 2022-12-02 (×2): 3 [IU] via SUBCUTANEOUS
  Administered 2022-12-02: 5 [IU] via SUBCUTANEOUS
  Administered 2022-12-03 – 2022-12-05 (×17): 3 [IU] via SUBCUTANEOUS
  Administered 2022-12-05: 5 [IU] via SUBCUTANEOUS
  Administered 2022-12-06 (×2): 3 [IU] via SUBCUTANEOUS

## 2022-12-02 MED ORDER — POTASSIUM PHOSPHATES 15 MMOLE/5ML IV SOLN
15.0000 mmol | Freq: Once | INTRAVENOUS | Status: AC
  Administered 2022-12-02: 15 mmol via INTRAVENOUS
  Filled 2022-12-02: qty 5

## 2022-12-02 MED ORDER — TRAVASOL 10 % IV SOLN
INTRAVENOUS | Status: AC
  Filled 2022-12-02: qty 1155

## 2022-12-02 NOTE — Progress Notes (Signed)
NAME:  Steven Wolf, MRN:  [id number], DOB:  03/18/1956, LOS: 3 ADMISSION DATE:  12/22/2022, CONSULTATION DATE: 12/16/2022 REFERRING MD: Dr. 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, peritoneal metastasis came in with worsening shortness of breath, leg edema   Asked to see for pleural effusions  Pertinent  Medical History   Past Medical History:  Diagnosis Date   Anxiety    Benign prostatic hypertrophy    Diverticulosis of colon    GERD (gastroesophageal reflux disease)    Sullivan Lone syndrome    History of kidney stones    Hypertension    Microscopic hematuria    Motion sickness    ocean boats, curvy roads   Nephrolithiasis     X 2; Dr Aldean Ast   Sleep apnea    USES CPAP   Syncope 02/2007   NOCTURNAL POST MICTURTION   Urticaria    Significant Hospital Events: Including procedures, antibiotic start and stop dates in addition to other pertinent events   X-ray with bilateral pleural effusion, will left worse than right  Interim History / Subjective:  Postthoracentesis for 1.7 L of fluid on left Post paracentesis for 1.7 L of fluid Postthoracentesis on the right for 900 cc  Breathing feels stable Denies any significant pain or discomfort today  Objective   Blood pressure 107/76, pulse (!) 123, temperature 98.4 F (36.9 C), temperature source Oral, resp. rate 20, height 6' (1.829 m), weight 77.2 kg, SpO2 96%.        Intake/Output Summary (Last 24 hours) at 12/02/2022 1009 Last data filed at 12/02/2022 2952 Gross per 24 hour  Intake 799.7 ml  Output 250 ml  Net 549.7 ml   Filed Weights   11/30/22 0457 12/01/22 0500 12/02/22 0545  Weight: 76.8 kg 76.4 kg 77.2 kg    Examination: General: Elderly, chronically ill-appearing HENT: Moist oral mucosa Lungs: Fair air entry bilaterally with few rales at the bases Cardiovascular: S1-S2 appreciated Abdomen: Full,  bowel sounds appreciated Extremities: Warm and dry Neuro: Alert and oriented x 3 GU:   I reviewed nursing notes, hospitalist notes, last 24 h vitals and pain scores, last 48 h intake and output, last 24 h labs and trends, and last 24 h imaging results.  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

## 2022-12-02 NOTE — Progress Notes (Signed)
Collection Time: 11/30/22  1:02 PM   Specimen: PATH Cytology Peritoneal fluid  Result Value Ref Range Status   Specimen Description   Final    PERITONEAL Performed at [facility], [address] [place], [place] [address]    Special Requests   Final    NONE Performed at [facility], [address] [place], [place] [address]    Gram Stain   Final    RARE WBC PRESENT, PREDOMINANTLY PMN NO ORGANISMS SEEN    Culture   Final    NO GROWTH 2 DAYS NO ANAEROBES ISOLATED; CULTURE IN PROGRESS FOR 5 DAYS Performed at [facility], [address], [place], [place] [address]    Report Status PENDING  Incomplete  Body fluid culture w Gram Stain     Status: None (Preliminary result)   Collection Time: 11/30/22  3:37 PM   Specimen: Pleural Fluid  Result Value Ref Range Status   Specimen Description   Final    PLEURAL Performed at [facility], [address] [place], [place] [address]    Special Requests   Final    NONE Performed at [facility], [address], [place], [place] [address]    Gram Stain   Final    RARE WBC PRESENT, PREDOMINANTLY PMN NO ORGANISMS SEEN    Culture   Final    NO GROWTH 2 DAYS Performed at [facility], [address], [place], [place] [address]    Report Status PENDING  Incomplete    Antimicrobials: Anti-infectives (From admission, onward)    Start     Dose/Rate Route Frequency Ordered Stop   12/10/2022 2200  metroNIDAZOLE (FLAGYL) IVPB 500 mg        500 mg 100 mL/hr over 60 Minutes Intravenous Every 12 hours 12/10/2022 1404     12/02/2022 2200  ceFEPIme (MAXIPIME) 2 g in sodium chloride 0.9 % 100 mL IVPB        2 g 200 mL/hr over 30 Minutes Intravenous Every 8 hours 12/21/2022 1418     12/13/2022 2200  vancomycin (VANCOREADY) IVPB 1250 mg/250 mL  Status:  Discontinued        1,250 mg 166.7 mL/hr over 90 Minutes Intravenous Every 12 hours 12/16/2022 1418 11/30/22 0956   11/30/2022 1230  ceFEPIme (MAXIPIME) 2 g in sodium chloride 0.9 % 100 mL IVPB        2 g 200 mL/hr over 30 Minutes Intravenous  Once 11/28/2022 1224 12/17/2022 1327   12/01/2022 1230  metroNIDAZOLE (FLAGYL) IVPB 500 mg        500 mg 100 mL/hr over 60 Minutes Intravenous  Once 11/30/2022 1224 12/09/2022 1401   11/28/2022 1230  vancomycin (VANCOCIN) IVPB 1000 mg/200 mL premix  Status:  Discontinued        1,000 mg 200 mL/hr over 60 Minutes Intravenous  Once 12/15/2022 1224 11/25/2022 1228   11/28/2022 1230  vancomycin (VANCOREADY) IVPB 1500 mg/300 mL        1,500 mg 150 mL/hr over 120 Minutes Intravenous  Once 12/16/2022 1228 12/23/2022 1600      Culture/Microbiology    Component Value Date/Time   SDES  11/30/2022 1537    PLEURAL Performed at [facility], [address], [place], [place] [address]    SPECREQUEST  11/30/2022 1537    NONE Performed at [facility],  [address] [place], [place] [address]    CULT  Collection Time: 11/30/22  1:02 PM   Specimen: PATH Cytology Peritoneal fluid  Result Value Ref Range Status   Specimen Description   Final    PERITONEAL Performed at [facility], [address] [place], [place] [address]    Special Requests   Final    NONE Performed at [facility], [address] [place], [place] [address]    Gram Stain   Final    RARE WBC PRESENT, PREDOMINANTLY PMN NO ORGANISMS SEEN    Culture   Final    NO GROWTH 2 DAYS NO ANAEROBES ISOLATED; CULTURE IN PROGRESS FOR 5 DAYS Performed at [facility], [address], [place], [place] [address]    Report Status PENDING  Incomplete  Body fluid culture w Gram Stain     Status: None (Preliminary result)   Collection Time: 11/30/22  3:37 PM   Specimen: Pleural Fluid  Result Value Ref Range Status   Specimen Description   Final    PLEURAL Performed at [facility], [address] [place], [place] [address]    Special Requests   Final    NONE Performed at [facility], [address], [place], [place] [address]    Gram Stain   Final    RARE WBC PRESENT, PREDOMINANTLY PMN NO ORGANISMS SEEN    Culture   Final    NO GROWTH 2 DAYS Performed at [facility], [address], [place], [place] [address]    Report Status PENDING  Incomplete    Antimicrobials: Anti-infectives (From admission, onward)    Start     Dose/Rate Route Frequency Ordered Stop   12/14/2022 2200  metroNIDAZOLE (FLAGYL) IVPB 500 mg        500 mg 100 mL/hr over 60 Minutes Intravenous Every 12 hours 12/13/2022 1404     11/28/2022 2200  ceFEPIme (MAXIPIME) 2 g in sodium chloride 0.9 % 100 mL IVPB        2 g 200 mL/hr over 30 Minutes Intravenous Every 8 hours 12/01/2022 1418     12/20/2022 2200  vancomycin (VANCOREADY) IVPB 1250 mg/250 mL  Status:  Discontinued        1,250 mg 166.7 mL/hr over 90 Minutes Intravenous Every 12 hours 11/25/2022 1418 11/30/22 0956   12/05/2022 1230  ceFEPIme (MAXIPIME) 2 g in sodium chloride 0.9 % 100 mL IVPB        2 g 200 mL/hr over 30 Minutes Intravenous  Once 12/18/2022 1224 12/04/2022 1327   12/10/2022 1230  metroNIDAZOLE (FLAGYL) IVPB 500 mg        500 mg 100 mL/hr over 60 Minutes Intravenous  Once 11/24/2022 1224 12/22/2022 1401   12/10/2022 1230  vancomycin (VANCOCIN) IVPB 1000 mg/200 mL premix  Status:  Discontinued        1,000 mg 200 mL/hr over 60 Minutes Intravenous  Once 12/04/2022 1224 11/27/2022 1228   12/10/2022 1230  vancomycin (VANCOREADY) IVPB 1500 mg/300 mL        1,500 mg 150 mL/hr over 120 Minutes Intravenous  Once 11/25/2022 1228 11/27/2022 1600      Culture/Microbiology    Component Value Date/Time   SDES  11/30/2022 1537    PLEURAL Performed at [facility], [address], [place], [place] [address]    SPECREQUEST  11/30/2022 1537    NONE Performed at [facility],  [address] [place], [place] [address]    CULT  Collection Time: 11/30/22  1:02 PM   Specimen: PATH Cytology Peritoneal fluid  Result Value Ref Range Status   Specimen Description   Final    PERITONEAL Performed at [facility], [address] [place], [place] [address]    Special Requests   Final    NONE Performed at [facility], [address] [place], [place] [address]    Gram Stain   Final    RARE WBC PRESENT, PREDOMINANTLY PMN NO ORGANISMS SEEN    Culture   Final    NO GROWTH 2 DAYS NO ANAEROBES ISOLATED; CULTURE IN PROGRESS FOR 5 DAYS Performed at [facility], [address], [place], [place] [address]    Report Status PENDING  Incomplete  Body fluid culture w Gram Stain     Status: None (Preliminary result)   Collection Time: 11/30/22  3:37 PM   Specimen: Pleural Fluid  Result Value Ref Range Status   Specimen Description   Final    PLEURAL Performed at [facility], [address] [place], [place] [address]    Special Requests   Final    NONE Performed at [facility], [address], [place], [place] [address]    Gram Stain   Final    RARE WBC PRESENT, PREDOMINANTLY PMN NO ORGANISMS SEEN    Culture   Final    NO GROWTH 2 DAYS Performed at [facility], [address], [place], [place] [address]    Report Status PENDING  Incomplete    Antimicrobials: Anti-infectives (From admission, onward)    Start     Dose/Rate Route Frequency Ordered Stop   12/14/2022 2200  metroNIDAZOLE (FLAGYL) IVPB 500 mg        500 mg 100 mL/hr over 60 Minutes Intravenous Every 12 hours 12/13/2022 1404     11/28/2022 2200  ceFEPIme (MAXIPIME) 2 g in sodium chloride 0.9 % 100 mL IVPB        2 g 200 mL/hr over 30 Minutes Intravenous Every 8 hours 12/01/2022 1418     12/20/2022 2200  vancomycin (VANCOREADY) IVPB 1250 mg/250 mL  Status:  Discontinued        1,250 mg 166.7 mL/hr over 90 Minutes Intravenous Every 12 hours 11/25/2022 1418 11/30/22 0956   12/05/2022 1230  ceFEPIme (MAXIPIME) 2 g in sodium chloride 0.9 % 100 mL IVPB        2 g 200 mL/hr over 30 Minutes Intravenous  Once 12/18/2022 1224 12/04/2022 1327   12/10/2022 1230  metroNIDAZOLE (FLAGYL) IVPB 500 mg        500 mg 100 mL/hr over 60 Minutes Intravenous  Once 11/24/2022 1224 12/22/2022 1401   12/10/2022 1230  vancomycin (VANCOCIN) IVPB 1000 mg/200 mL premix  Status:  Discontinued        1,000 mg 200 mL/hr over 60 Minutes Intravenous  Once 12/04/2022 1224 11/27/2022 1228   12/10/2022 1230  vancomycin (VANCOREADY) IVPB 1500 mg/300 mL        1,500 mg 150 mL/hr over 120 Minutes Intravenous  Once 11/25/2022 1228 11/27/2022 1600      Culture/Microbiology    Component Value Date/Time   SDES  11/30/2022 1537    PLEURAL Performed at [facility], [address], [place], [place] [address]    SPECREQUEST  11/30/2022 1537    NONE Performed at [facility],  [address] [place], [place] [address]    CULT  yellow fluid from the right lateral abdomen however during the procedure patient had a leaking J-tube which appeared to be bilious and dark green that was inconsistent with typical appearance of ascitic fluid however it appeared that this was gastric contents from around his tract and the interventional radiology team recommended reducing gauze underneath the bumper and change Mildly tachypneic at times, Recent Labs  Lab 11/28/22 1012 12/16/2022 1130 12/08/2022 1530 11/30/22 0500 12/01/22 0414  WBC 23.3* 23.5* 20.8* 18.1* 18.7*    Normocytic Anemia Anemia of chronic disease: Stable.  No indication for transfusion.      Hypokalemia -replete w/ TPN  Oral Thrush Cont nystatin   T2DM: SSI Q4HR  DVT prophylaxis: enoxaparin (LOVENOX) injection 40 mg Start: 12/11/2022 2200 Code Status:   Code Status: Limited: Do not attempt resuscitation (DNR) -DNR-LIMITED -Do Not Intubate/DNI  Family Communication: plan of care discussed with patient/no family  at bedside. Patient status is: Inpatient because of pleural effusions symptomatic Level of care: Med-Surg   Dispo: The patient is from: home            Anticipated disposition: Likely hospice after discussion with palliative care /oncology  Objective: Vitals last 24 hrs: Vitals:   12/02/22 0545 12/02/22 0842 12/02/22 1246 12/02/22 1447  BP:  107/76 101/76 117/78  Pulse:  (!) 123 (!) 128 (!) 127  Resp:  20 14 20   Temp:  98.4 F (36.9 C) 97.9 F (36.6 C) 97.8 F (36.6 C)  TempSrc:  Oral  Oral  SpO2:  96% 96% 97%  Weight: 77.2 kg     Height:       Weight change: 0.8 kg  Physical Examination: General exam: alert awake, orientedx3, pleasant, comfortable HEENT:Oral mucosa moist, Ear/Nose WNL grossly Respiratory system: Bilaterally clear BS,no use of accessory muscle Cardiovascular system: S1 & S2 +, No JVD. Gastrointestinal system: Abdomen soft,J tube in place with draiange, tender, BS present   Nervous System: Alert, awake, moving all extremities,and following commands. Extremities: LE edema neg,distal peripheral pulses palpable and warm.  Skin: No rashes,no icterus. MSK: Normal muscle bulk,tone, power   Medications reviewed:  Scheduled Meds:  Chlorhexidine Gluconate Cloth  6 each Topical Daily   enoxaparin (LOVENOX) injection  40 mg Subcutaneous Q24H   insulin aspart  0-15 Units Subcutaneous Q4H   nystatin  5 mL Oral QID   mouth rinse  15 mL Mouth Rinse 4 times per day   sodium chloride flush  3 mL Intravenous Q12H   Continuous Infusions:  ceFEPime (MAXIPIME) IV 2 g (12/02/22 1440)   metronidazole 500 mg (12/02/22 1107)   potassium PHOSPHATE IVPB (in mmol) 15 mmol (12/02/22 1101)   TPN ADULT (ION) 87.5 mL/hr at 12/01/22 1802   TPN ADULT (ION)        Diet Order             Diet NPO time specified  Diet effective now                    Nutrition Problem: Inadequate oral intake 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 [place], [place] [address]    Special Requests   Final    NONE Performed at [facility], [address] [place], [place] [address]    Gram Stain   Final    RARE WBC PRESENT, PREDOMINANTLY PMN NO ORGANISMS SEEN    Culture   Final    NO GROWTH 2 DAYS NO ANAEROBES ISOLATED; CULTURE IN PROGRESS FOR 5 DAYS Performed at [facility], [address], [place], [place] [address]    Report Status PENDING  Incomplete  Body fluid culture w Gram Stain     Status: None (Preliminary result)   Collection Time: 11/30/22  3:37 PM   Specimen: Pleural Fluid  Result Value Ref Range Status   Specimen Description   Final    PLEURAL Performed at [facility], [address] [place], [place] [address]    Special Requests   Final    NONE Performed at [facility], [address], [place], [place] [address]    Gram Stain   Final    RARE WBC PRESENT, PREDOMINANTLY PMN NO ORGANISMS SEEN    Culture   Final    NO GROWTH 2 DAYS Performed at [facility], [address], [place], [place] [address]    Report Status PENDING  Incomplete    Antimicrobials: Anti-infectives (From admission, onward)    Start     Dose/Rate Route Frequency Ordered Stop   12/10/2022 2200  metroNIDAZOLE (FLAGYL) IVPB 500 mg        500 mg 100 mL/hr over 60 Minutes Intravenous Every 12 hours 12/10/2022 1404     12/02/2022 2200  ceFEPIme (MAXIPIME) 2 g in sodium chloride 0.9 % 100 mL IVPB        2 g 200 mL/hr over 30 Minutes Intravenous Every 8 hours 12/21/2022 1418     12/13/2022 2200  vancomycin (VANCOREADY) IVPB 1250 mg/250 mL  Status:  Discontinued        1,250 mg 166.7 mL/hr over 90 Minutes Intravenous Every 12 hours 12/16/2022 1418 11/30/22 0956   11/30/2022 1230  ceFEPIme (MAXIPIME) 2 g in sodium chloride 0.9 % 100 mL IVPB        2 g 200 mL/hr over 30 Minutes Intravenous  Once 11/28/2022 1224 12/17/2022 1327   12/01/2022 1230  metroNIDAZOLE (FLAGYL) IVPB 500 mg        500 mg 100 mL/hr over 60 Minutes Intravenous  Once 11/30/2022 1224 12/09/2022 1401   11/28/2022 1230  vancomycin (VANCOCIN) IVPB 1000 mg/200 mL premix  Status:  Discontinued        1,000 mg 200 mL/hr over 60 Minutes Intravenous  Once 12/15/2022 1224 11/25/2022 1228   11/28/2022 1230  vancomycin (VANCOREADY) IVPB 1500 mg/300 mL        1,500 mg 150 mL/hr over 120 Minutes Intravenous  Once 12/16/2022 1228 12/23/2022 1600      Culture/Microbiology    Component Value Date/Time   SDES  11/30/2022 1537    PLEURAL Performed at [facility], [address], [place], [place] [address]    SPECREQUEST  11/30/2022 1537    NONE Performed at [facility],  [address] [place], [place] [address]    CULT  Collection Time: 11/30/22  1:02 PM   Specimen: PATH Cytology Peritoneal fluid  Result Value Ref Range Status   Specimen Description   Final    PERITONEAL Performed at [facility], [address] [place], [place] [address]    Special Requests   Final    NONE Performed at [facility], [address] [place], [place] [address]    Gram Stain   Final    RARE WBC PRESENT, PREDOMINANTLY PMN NO ORGANISMS SEEN    Culture   Final    NO GROWTH 2 DAYS NO ANAEROBES ISOLATED; CULTURE IN PROGRESS FOR 5 DAYS Performed at [facility], [address], [place], [place] [address]    Report Status PENDING  Incomplete  Body fluid culture w Gram Stain     Status: None (Preliminary result)   Collection Time: 11/30/22  3:37 PM   Specimen: Pleural Fluid  Result Value Ref Range Status   Specimen Description   Final    PLEURAL Performed at [facility], [address] [place], [place] [address]    Special Requests   Final    NONE Performed at [facility], [address], [place], [place] [address]    Gram Stain   Final    RARE WBC PRESENT, PREDOMINANTLY PMN NO ORGANISMS SEEN    Culture   Final    NO GROWTH 2 DAYS Performed at [facility], [address], [place], [place] [address]    Report Status PENDING  Incomplete    Antimicrobials: Anti-infectives (From admission, onward)    Start     Dose/Rate Route Frequency Ordered Stop   12/14/2022 2200  metroNIDAZOLE (FLAGYL) IVPB 500 mg        500 mg 100 mL/hr over 60 Minutes Intravenous Every 12 hours 12/13/2022 1404     11/28/2022 2200  ceFEPIme (MAXIPIME) 2 g in sodium chloride 0.9 % 100 mL IVPB        2 g 200 mL/hr over 30 Minutes Intravenous Every 8 hours 12/01/2022 1418     12/20/2022 2200  vancomycin (VANCOREADY) IVPB 1250 mg/250 mL  Status:  Discontinued        1,250 mg 166.7 mL/hr over 90 Minutes Intravenous Every 12 hours 11/25/2022 1418 11/30/22 0956   12/05/2022 1230  ceFEPIme (MAXIPIME) 2 g in sodium chloride 0.9 % 100 mL IVPB        2 g 200 mL/hr over 30 Minutes Intravenous  Once 12/18/2022 1224 12/04/2022 1327   12/10/2022 1230  metroNIDAZOLE (FLAGYL) IVPB 500 mg        500 mg 100 mL/hr over 60 Minutes Intravenous  Once 11/24/2022 1224 12/22/2022 1401   12/10/2022 1230  vancomycin (VANCOCIN) IVPB 1000 mg/200 mL premix  Status:  Discontinued        1,000 mg 200 mL/hr over 60 Minutes Intravenous  Once 12/04/2022 1224 11/27/2022 1228   12/10/2022 1230  vancomycin (VANCOREADY) IVPB 1500 mg/300 mL        1,500 mg 150 mL/hr over 120 Minutes Intravenous  Once 11/25/2022 1228 11/27/2022 1600      Culture/Microbiology    Component Value Date/Time   SDES  11/30/2022 1537    PLEURAL Performed at [facility], [address], [place], [place] [address]    SPECREQUEST  11/30/2022 1537    NONE Performed at [facility],  [address] [place], [place] [address]    CULT  Collection Time: 11/30/22  1:02 PM   Specimen: PATH Cytology Peritoneal fluid  Result Value Ref Range Status   Specimen Description   Final    PERITONEAL Performed at [facility], [address] [place], [place] [address]    Special Requests   Final    NONE Performed at [facility], [address] [place], [place] [address]    Gram Stain   Final    RARE WBC PRESENT, PREDOMINANTLY PMN NO ORGANISMS SEEN    Culture   Final    NO GROWTH 2 DAYS NO ANAEROBES ISOLATED; CULTURE IN PROGRESS FOR 5 DAYS Performed at [facility], [address], [place], [place] [address]    Report Status PENDING  Incomplete  Body fluid culture w Gram Stain     Status: None (Preliminary result)   Collection Time: 11/30/22  3:37 PM   Specimen: Pleural Fluid  Result Value Ref Range Status   Specimen Description   Final    PLEURAL Performed at [facility], [address] [place], [place] [address]    Special Requests   Final    NONE Performed at [facility], [address], [place], [place] [address]    Gram Stain   Final    RARE WBC PRESENT, PREDOMINANTLY PMN NO ORGANISMS SEEN    Culture   Final    NO GROWTH 2 DAYS Performed at [facility], [address], [place], [place] [address]    Report Status PENDING  Incomplete    Antimicrobials: Anti-infectives (From admission, onward)    Start     Dose/Rate Route Frequency Ordered Stop   12/10/2022 2200  metroNIDAZOLE (FLAGYL) IVPB 500 mg        500 mg 100 mL/hr over 60 Minutes Intravenous Every 12 hours 12/10/2022 1404     12/02/2022 2200  ceFEPIme (MAXIPIME) 2 g in sodium chloride 0.9 % 100 mL IVPB        2 g 200 mL/hr over 30 Minutes Intravenous Every 8 hours 12/21/2022 1418     12/13/2022 2200  vancomycin (VANCOREADY) IVPB 1250 mg/250 mL  Status:  Discontinued        1,250 mg 166.7 mL/hr over 90 Minutes Intravenous Every 12 hours 12/16/2022 1418 11/30/22 0956   11/30/2022 1230  ceFEPIme (MAXIPIME) 2 g in sodium chloride 0.9 % 100 mL IVPB        2 g 200 mL/hr over 30 Minutes Intravenous  Once 11/28/2022 1224 12/17/2022 1327   12/01/2022 1230  metroNIDAZOLE (FLAGYL) IVPB 500 mg        500 mg 100 mL/hr over 60 Minutes Intravenous  Once 11/30/2022 1224 12/09/2022 1401   11/28/2022 1230  vancomycin (VANCOCIN) IVPB 1000 mg/200 mL premix  Status:  Discontinued        1,000 mg 200 mL/hr over 60 Minutes Intravenous  Once 12/15/2022 1224 11/25/2022 1228   11/28/2022 1230  vancomycin (VANCOREADY) IVPB 1500 mg/300 mL        1,500 mg 150 mL/hr over 120 Minutes Intravenous  Once 12/16/2022 1228 12/23/2022 1600      Culture/Microbiology    Component Value Date/Time   SDES  11/30/2022 1537    PLEURAL Performed at [facility], [address], [place], [place] [address]    SPECREQUEST  11/30/2022 1537    NONE Performed at [facility],  [address] [place], [place] [address]    CULT  Collection Time: 11/30/22  1:02 PM   Specimen: PATH Cytology Peritoneal fluid  Result Value Ref Range Status   Specimen Description   Final    PERITONEAL Performed at [facility], [address] [place], [place] [address]    Special Requests   Final    NONE Performed at [facility], [address] [place], [place] [address]    Gram Stain   Final    RARE WBC PRESENT, PREDOMINANTLY PMN NO ORGANISMS SEEN    Culture   Final    NO GROWTH 2 DAYS NO ANAEROBES ISOLATED; CULTURE IN PROGRESS FOR 5 DAYS Performed at [facility], [address], [place], [place] [address]    Report Status PENDING  Incomplete  Body fluid culture w Gram Stain     Status: None (Preliminary result)   Collection Time: 11/30/22  3:37 PM   Specimen: Pleural Fluid  Result Value Ref Range Status   Specimen Description   Final    PLEURAL Performed at [facility], [address] [place], [place] [address]    Special Requests   Final    NONE Performed at [facility], [address], [place], [place] [address]    Gram Stain   Final    RARE WBC PRESENT, PREDOMINANTLY PMN NO ORGANISMS SEEN    Culture   Final    NO GROWTH 2 DAYS Performed at [facility], [address], [place], [place] [address]    Report Status PENDING  Incomplete    Antimicrobials: Anti-infectives (From admission, onward)    Start     Dose/Rate Route Frequency Ordered Stop   12/10/2022 2200  metroNIDAZOLE (FLAGYL) IVPB 500 mg        500 mg 100 mL/hr over 60 Minutes Intravenous Every 12 hours 12/10/2022 1404     12/02/2022 2200  ceFEPIme (MAXIPIME) 2 g in sodium chloride 0.9 % 100 mL IVPB        2 g 200 mL/hr over 30 Minutes Intravenous Every 8 hours 12/21/2022 1418     12/13/2022 2200  vancomycin (VANCOREADY) IVPB 1250 mg/250 mL  Status:  Discontinued        1,250 mg 166.7 mL/hr over 90 Minutes Intravenous Every 12 hours 12/16/2022 1418 11/30/22 0956   11/30/2022 1230  ceFEPIme (MAXIPIME) 2 g in sodium chloride 0.9 % 100 mL IVPB        2 g 200 mL/hr over 30 Minutes Intravenous  Once 11/28/2022 1224 12/17/2022 1327   12/01/2022 1230  metroNIDAZOLE (FLAGYL) IVPB 500 mg        500 mg 100 mL/hr over 60 Minutes Intravenous  Once 11/30/2022 1224 12/09/2022 1401   11/28/2022 1230  vancomycin (VANCOCIN) IVPB 1000 mg/200 mL premix  Status:  Discontinued        1,000 mg 200 mL/hr over 60 Minutes Intravenous  Once 12/15/2022 1224 11/25/2022 1228   11/28/2022 1230  vancomycin (VANCOREADY) IVPB 1500 mg/300 mL        1,500 mg 150 mL/hr over 120 Minutes Intravenous  Once 12/16/2022 1228 12/23/2022 1600      Culture/Microbiology    Component Value Date/Time   SDES  11/30/2022 1537    PLEURAL Performed at [facility], [address], [place], [place] [address]    SPECREQUEST  11/30/2022 1537    NONE Performed at [facility],  [address] [place], [place] [address]    CULT  yellow fluid from the right lateral abdomen however during the procedure patient had a leaking J-tube which appeared to be bilious and dark green that was inconsistent with typical appearance of ascitic fluid however it appeared that this was gastric contents from around his tract and the interventional radiology team recommended reducing gauze underneath the bumper and change Mildly tachypneic at times, Recent Labs  Lab 11/28/22 1012 12/16/2022 1130 12/08/2022 1530 11/30/22 0500 12/01/22 0414  WBC 23.3* 23.5* 20.8* 18.1* 18.7*    Normocytic Anemia Anemia of chronic disease: Stable.  No indication for transfusion.      Hypokalemia -replete w/ TPN  Oral Thrush Cont nystatin   T2DM: SSI Q4HR  DVT prophylaxis: enoxaparin (LOVENOX) injection 40 mg Start: 12/11/2022 2200 Code Status:   Code Status: Limited: Do not attempt resuscitation (DNR) -DNR-LIMITED -Do Not Intubate/DNI  Family Communication: plan of care discussed with patient/no family  at bedside. Patient status is: Inpatient because of pleural effusions symptomatic Level of care: Med-Surg   Dispo: The patient is from: home            Anticipated disposition: Likely hospice after discussion with palliative care /oncology  Objective: Vitals last 24 hrs: Vitals:   12/02/22 0545 12/02/22 0842 12/02/22 1246 12/02/22 1447  BP:  107/76 101/76 117/78  Pulse:  (!) 123 (!) 128 (!) 127  Resp:  20 14 20   Temp:  98.4 F (36.9 C) 97.9 F (36.6 C) 97.8 F (36.6 C)  TempSrc:  Oral  Oral  SpO2:  96% 96% 97%  Weight: 77.2 kg     Height:       Weight change: 0.8 kg  Physical Examination: General exam: alert awake, orientedx3, pleasant, comfortable HEENT:Oral mucosa moist, Ear/Nose WNL grossly Respiratory system: Bilaterally clear BS,no use of accessory muscle Cardiovascular system: S1 & S2 +, No JVD. Gastrointestinal system: Abdomen soft,J tube in place with draiange, tender, BS present   Nervous System: Alert, awake, moving all extremities,and following commands. Extremities: LE edema neg,distal peripheral pulses palpable and warm.  Skin: No rashes,no icterus. MSK: Normal muscle bulk,tone, power   Medications reviewed:  Scheduled Meds:  Chlorhexidine Gluconate Cloth  6 each Topical Daily   enoxaparin (LOVENOX) injection  40 mg Subcutaneous Q24H   insulin aspart  0-15 Units Subcutaneous Q4H   nystatin  5 mL Oral QID   mouth rinse  15 mL Mouth Rinse 4 times per day   sodium chloride flush  3 mL Intravenous Q12H   Continuous Infusions:  ceFEPime (MAXIPIME) IV 2 g (12/02/22 1440)   metronidazole 500 mg (12/02/22 1107)   potassium PHOSPHATE IVPB (in mmol) 15 mmol (12/02/22 1101)   TPN ADULT (ION) 87.5 mL/hr at 12/01/22 1802   TPN ADULT (ION)        Diet Order             Diet NPO time specified  Diet effective now                    Nutrition Problem: Inadequate oral intake 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

## 2022-12-02 NOTE — TOC Progression Note (Signed)
Transition of Care [facility]) - Progression Note    Patient Details  Name: KARIN PISANO MRN: [id number] Date of Birth: 1956/01/21  Transition of [facility]) CM/SW Contact  Geni Bers, RN Phone Number: 12/02/2022, 12:50 PM  Clinical Narrative:    Spoke with pt's wife  Mrs. Innocent concerning discharge plans. Mrs. Stege asked about Hospice. Hospice was explained to spouse and choice was given. Mrs. Christiansen had no preference. Referral given to [facility].    Expected Discharge Plan: Home w Hospice Care Barriers to Discharge: No Barriers Identified  Expected Discharge Plan and Services     Post Acute Care Choice: Hospice Living arrangements for the past 2 months: Single Family Home                                       Social Determinants of Health (SDOH) Interventions SDOH Screenings   Food Insecurity: No Food Insecurity (11/04/2022)  Housing: Low Risk  (10/14/2022)  Transportation Needs: No Transportation Needs (11/04/2022)  Utilities: Not At Risk (10/14/2022)  Depression (PHQ2-9): Medium Risk (08/13/2022)  Financial Resource Strain: Low Risk  (08/12/2022)  Physical Activity: Sufficiently Active (08/12/2022)  Social Connections: Unknown (08/12/2022)  Stress: Stress Concern Present (08/12/2022)  Tobacco Use: Low Risk  (12/22/2022)    Readmission Risk Interventions    10/30/2022    2:51 PM  Readmission Risk Prevention Plan  Transportation Screening Complete  PCP or Specialist Appt within 5-7 Days Complete  Home Care Screening Complete  Medication Review (RN CM) Referral to Pharmacy

## 2022-12-02 NOTE — Progress Notes (Signed)
[place] [employer] trader and  has been authorized for detection and/or diagnosis of SARS-CoV-2 by FDA under an TEFL teacher (EUA). This EUA will remain  in effect (meaning this test can be used) for the duration of the COVID-19 declaration under Section 564(b)(1) of the Act, 21 U.S.C.section 360bbb-3(b)(1), unless the authorization is terminated  or revoked sooner.       Influenza A by PCR NEGATIVE NEGATIVE Final   Influenza B by PCR NEGATIVE NEGATIVE Final    Comment: (NOTE) The Xpert Xpress SARS-CoV-2/FLU/RSV plus assay is intended as an aid in the diagnosis of influenza from Nasopharyngeal swab specimens and should not be used as a sole basis for treatment. Nasal washings and aspirates are unacceptable for Xpert Xpress SARS-CoV-2/FLU/RSV testing.  Fact Sheet for Patients: BloggerCourse.com  Fact Sheet for Healthcare Providers: SeriousBroker.it  This test is not yet approved or cleared by the [place] FDA and has been authorized for detection and/or diagnosis of SARS-CoV-2 by FDA under an Emergency Use Authorization (EUA). This EUA will remain in effect (meaning this test can be used) for the duration of the COVID-19 declaration under Section 564(b)(1) of the Act, 21 U.S.C. section 360bbb-3(b)(1), unless the authorization  is terminated or revoked.     Resp Syncytial Virus by PCR NEGATIVE NEGATIVE Final    Comment: (NOTE) Fact Sheet for Patients: BloggerCourse.com  Fact Sheet for Healthcare Providers: SeriousBroker.it  This test is not yet approved or cleared by the [place] FDA and has been authorized for detection and/or diagnosis of SARS-CoV-2 by FDA under an Emergency Use Authorization (EUA). This EUA will remain in effect (meaning this test can be used) for the duration of the COVID-19 declaration under Section 564(b)(1) of the Act, 21 U.S.C. section 360bbb-3(b)(1), unless the authorization is terminated or revoked.  Performed at [facility], [address] [place], [place] [address]   MRSA Next Gen by PCR, Nasal     Status: None   Collection Time: 12/17/2022  3:45 PM   Specimen: Nasal Mucosa; Nasal Swab  Result Value Ref Range Status   MRSA by PCR Next Gen NOT DETECTED NOT DETECTED Final    Comment: (NOTE) The GeneXpert MRSA Assay (FDA approved for NASAL specimens only), is one component of a comprehensive MRSA colonization surveillance program. It is not intended to diagnose MRSA infection nor to guide or monitor treatment for MRSA infections. Test performance is not FDA approved in patients less than 14 years old. Performed at [facility], [address] [place], [place] [address]   Aerobic/Anaerobic Culture w Gram Stain (surgical/deep wound)     Status: None (Preliminary result)   Collection Time: 11/30/22  1:02 PM   Specimen: PATH Cytology Peritoneal fluid  Result Value Ref Range Status   Specimen Description   Final    PERITONEAL Performed at [facility], [address], [place], [place] [address]    Special Requests   Final    NONE Performed at [facility], [address], [place], [place] [address]    Gram Stain   Final    RARE WBC PRESENT,  PREDOMINANTLY PMN NO ORGANISMS SEEN    Culture   Final    NO GROWTH 2 DAYS NO ANAEROBES ISOLATED; CULTURE IN PROGRESS FOR 5 DAYS Performed at [facility], [address], [place], [place] [address]    Report Status PENDING  Incomplete  Body fluid culture w Gram Stain     Status:  Steven Wolf   DOB:31-Aug-1955   VO#:[id number]   VQQ#:[id number]  Medical oncology follow-up  Subjective: Patient is well-known to me, under my care for his metastatic gastric cancer.  He is on first-line immunotherapy, which started about 3-4 weeks ago.  He presented with worsening leakage from the J-tube, shortness of breath on exertion, and leg edema.  He underwent bilateral thoracentesis.  His main concern is the leakage from his feeding tube, which has been packed with gauze and towel, but is gown and pad are still wet from the leakage.   Objective:  Vitals:   12/02/22 1246 12/02/22 1447  BP: 101/76 117/78  Pulse: (!) 128 (!) 127  Resp: 14 20  Temp: 97.9 F (36.6 C) 97.8 F (36.6 C)  SpO2: 96% 97%    Body mass index is 23.08 kg/m.  Intake/Output Summary (Last 24 hours) at 12/02/2022 1823 Last data filed at 12/02/2022 1447 Gross per 24 hour  Intake 0 ml  Output 400 ml  Net -400 ml     Sclerae unicteric  Oropharynx clear  No peripheral adenopathy  Lungs clear -- no rales or rhonchi  Heart regular rate and rhythm  Abdomen slightly distended, the J feeding tube on the left side abdomen is covered by large amount of gauze in a towel, with yellowish discharge  MSK no focal spinal tenderness, no peripheral edema  Neuro nonfocal    CBG (last 3)  Recent Labs    12/02/22 0736 12/02/22 1123 12/02/22 1617  GLUCAP 208* 204* 182*     Labs:   Urine Studies No results for input(s): "UHGB", "CRYS" in the last 72 hours.  Invalid input(s): "UACOL", "UAPR", "USPG", "UPH", "UTP", "UGL", "UKET", "UBIL", "UNIT", "UROB", "ULEU", "UEPI", "UWBC", "URBC", "UBAC", "CAST", "UCOM", "BILUA"  Basic Metabolic Panel: Recent Labs  Lab 11/28/22 1012 12/21/2022 1130 12/19/2022 1625 11/30/22 0500 12/01/22 0414 12/02/22 0758  NA 137 137  --  138 140 139  K 4.6 4.3  --  3.2* 3.4* 4.5  CL 103 102  --  100 105 107  CO2 27 25  --  24 25 24   GLUCOSE 123* 122*  --  103* 228* 214*  BUN 39* 41*   --  40* 48* 60*  CREATININE 0.63 0.62 0.63 0.69 0.95 0.87  CALCIUM 9.2 8.7*  --  9.0 8.6* 8.8*  MG  --   --   --  1.9 2.1 2.3  PHOS  --   --   --  6.5* 3.1 2.3*   GFR Estimated Creatinine Clearance: 87.5 mL/min (by C-G formula based on SCr of 0.87 mg/dL). Liver Function Tests: Recent Labs  Lab 11/28/22 1012 12/08/2022 1130 12/16/2022 1625 11/30/22 0500 12/01/22 0414 12/02/22 0758  AST 20 27  --  26 35 29  ALT 13 18  --  18 25 28   ALKPHOS 111 119  --  111 100 112  BILITOT 1.4* 1.7*  --  2.8* 1.7* 1.1  PROT 6.4* 6.2* 6.1* 6.2* 5.5* 5.4*  ALBUMIN 2.8* 2.0*  --  3.0* 2.4* 2.1*   No results for input(s): "LIPASE", "AMYLASE" in the last 168 hours. No results for input(s): "AMMONIA" in the last 168 hours. Coagulation profile Recent Labs  Lab 12/13/2022 1130  INR 1.2    CBC: Recent Labs  Lab 11/28/22 1012 12/16/2022 1130 12/11/2022 1530 11/30/22 0500 12/01/22 0414  WBC 23.3* 23.5* 20.8* 18.1* 18.7*  NEUTROABS 19.5* 19.7*  --   --  16.5*  HGB 10.6* 10.5* 10.0* 9.1*  Steven Wolf   DOB:31-Aug-1955   VO#:[id number]   VQQ#:[id number]  Medical oncology follow-up  Subjective: Patient is well-known to me, under my care for his metastatic gastric cancer.  He is on first-line immunotherapy, which started about 3-4 weeks ago.  He presented with worsening leakage from the J-tube, shortness of breath on exertion, and leg edema.  He underwent bilateral thoracentesis.  His main concern is the leakage from his feeding tube, which has been packed with gauze and towel, but is gown and pad are still wet from the leakage.   Objective:  Vitals:   12/02/22 1246 12/02/22 1447  BP: 101/76 117/78  Pulse: (!) 128 (!) 127  Resp: 14 20  Temp: 97.9 F (36.6 C) 97.8 F (36.6 C)  SpO2: 96% 97%    Body mass index is 23.08 kg/m.  Intake/Output Summary (Last 24 hours) at 12/02/2022 1823 Last data filed at 12/02/2022 1447 Gross per 24 hour  Intake 0 ml  Output 400 ml  Net -400 ml     Sclerae unicteric  Oropharynx clear  No peripheral adenopathy  Lungs clear -- no rales or rhonchi  Heart regular rate and rhythm  Abdomen slightly distended, the J feeding tube on the left side abdomen is covered by large amount of gauze in a towel, with yellowish discharge  MSK no focal spinal tenderness, no peripheral edema  Neuro nonfocal    CBG (last 3)  Recent Labs    12/02/22 0736 12/02/22 1123 12/02/22 1617  GLUCAP 208* 204* 182*     Labs:   Urine Studies No results for input(s): "UHGB", "CRYS" in the last 72 hours.  Invalid input(s): "UACOL", "UAPR", "USPG", "UPH", "UTP", "UGL", "UKET", "UBIL", "UNIT", "UROB", "ULEU", "UEPI", "UWBC", "URBC", "UBAC", "CAST", "UCOM", "BILUA"  Basic Metabolic Panel: Recent Labs  Lab 11/28/22 1012 12/21/2022 1130 12/19/2022 1625 11/30/22 0500 12/01/22 0414 12/02/22 0758  NA 137 137  --  138 140 139  K 4.6 4.3  --  3.2* 3.4* 4.5  CL 103 102  --  100 105 107  CO2 27 25  --  24 25 24   GLUCOSE 123* 122*  --  103* 228* 214*  BUN 39* 41*   --  40* 48* 60*  CREATININE 0.63 0.62 0.63 0.69 0.95 0.87  CALCIUM 9.2 8.7*  --  9.0 8.6* 8.8*  MG  --   --   --  1.9 2.1 2.3  PHOS  --   --   --  6.5* 3.1 2.3*   GFR Estimated Creatinine Clearance: 87.5 mL/min (by C-G formula based on SCr of 0.87 mg/dL). Liver Function Tests: Recent Labs  Lab 11/28/22 1012 12/08/2022 1130 12/16/2022 1625 11/30/22 0500 12/01/22 0414 12/02/22 0758  AST 20 27  --  26 35 29  ALT 13 18  --  18 25 28   ALKPHOS 111 119  --  111 100 112  BILITOT 1.4* 1.7*  --  2.8* 1.7* 1.1  PROT 6.4* 6.2* 6.1* 6.2* 5.5* 5.4*  ALBUMIN 2.8* 2.0*  --  3.0* 2.4* 2.1*   No results for input(s): "LIPASE", "AMYLASE" in the last 168 hours. No results for input(s): "AMMONIA" in the last 168 hours. Coagulation profile Recent Labs  Lab 12/13/2022 1130  INR 1.2    CBC: Recent Labs  Lab 11/28/22 1012 12/16/2022 1130 12/11/2022 1530 11/30/22 0500 12/01/22 0414  WBC 23.3* 23.5* 20.8* 18.1* 18.7*  NEUTROABS 19.5* 19.7*  --   --  16.5*  HGB 10.6* 10.5* 10.0* 9.1*  [place] [employer] trader and  has been authorized for detection and/or diagnosis of SARS-CoV-2 by FDA under an TEFL teacher (EUA). This EUA will remain  in effect (meaning this test can be used) for the duration of the COVID-19 declaration under Section 564(b)(1) of the Act, 21 U.S.C.section 360bbb-3(b)(1), unless the authorization is terminated  or revoked sooner.       Influenza A by PCR NEGATIVE NEGATIVE Final   Influenza B by PCR NEGATIVE NEGATIVE Final    Comment: (NOTE) The Xpert Xpress SARS-CoV-2/FLU/RSV plus assay is intended as an aid in the diagnosis of influenza from Nasopharyngeal swab specimens and should not be used as a sole basis for treatment. Nasal washings and aspirates are unacceptable for Xpert Xpress SARS-CoV-2/FLU/RSV testing.  Fact Sheet for Patients: BloggerCourse.com  Fact Sheet for Healthcare Providers: SeriousBroker.it  This test is not yet approved or cleared by the [place] FDA and has been authorized for detection and/or diagnosis of SARS-CoV-2 by FDA under an Emergency Use Authorization (EUA). This EUA will remain in effect (meaning this test can be used) for the duration of the COVID-19 declaration under Section 564(b)(1) of the Act, 21 U.S.C. section 360bbb-3(b)(1), unless the authorization  is terminated or revoked.     Resp Syncytial Virus by PCR NEGATIVE NEGATIVE Final    Comment: (NOTE) Fact Sheet for Patients: BloggerCourse.com  Fact Sheet for Healthcare Providers: SeriousBroker.it  This test is not yet approved or cleared by the [place] FDA and has been authorized for detection and/or diagnosis of SARS-CoV-2 by FDA under an Emergency Use Authorization (EUA). This EUA will remain in effect (meaning this test can be used) for the duration of the COVID-19 declaration under Section 564(b)(1) of the Act, 21 U.S.C. section 360bbb-3(b)(1), unless the authorization is terminated or revoked.  Performed at [facility], [address] [place], [place] [address]   MRSA Next Gen by PCR, Nasal     Status: None   Collection Time: 12/17/2022  3:45 PM   Specimen: Nasal Mucosa; Nasal Swab  Result Value Ref Range Status   MRSA by PCR Next Gen NOT DETECTED NOT DETECTED Final    Comment: (NOTE) The GeneXpert MRSA Assay (FDA approved for NASAL specimens only), is one component of a comprehensive MRSA colonization surveillance program. It is not intended to diagnose MRSA infection nor to guide or monitor treatment for MRSA infections. Test performance is not FDA approved in patients less than 14 years old. Performed at [facility], [address] [place], [place] [address]   Aerobic/Anaerobic Culture w Gram Stain (surgical/deep wound)     Status: None (Preliminary result)   Collection Time: 11/30/22  1:02 PM   Specimen: PATH Cytology Peritoneal fluid  Result Value Ref Range Status   Specimen Description   Final    PERITONEAL Performed at [facility], [address], [place], [place] [address]    Special Requests   Final    NONE Performed at [facility], [address], [place], [place] [address]    Gram Stain   Final    RARE WBC PRESENT,  PREDOMINANTLY PMN NO ORGANISMS SEEN    Culture   Final    NO GROWTH 2 DAYS NO ANAEROBES ISOLATED; CULTURE IN PROGRESS FOR 5 DAYS Performed at [facility], [address], [place], [place] [address]    Report Status PENDING  Incomplete  Body fluid culture w Gram Stain     Status:  Steven Wolf   DOB:31-Aug-1955   VO#:[id number]   VQQ#:[id number]  Medical oncology follow-up  Subjective: Patient is well-known to me, under my care for his metastatic gastric cancer.  He is on first-line immunotherapy, which started about 3-4 weeks ago.  He presented with worsening leakage from the J-tube, shortness of breath on exertion, and leg edema.  He underwent bilateral thoracentesis.  His main concern is the leakage from his feeding tube, which has been packed with gauze and towel, but is gown and pad are still wet from the leakage.   Objective:  Vitals:   12/02/22 1246 12/02/22 1447  BP: 101/76 117/78  Pulse: (!) 128 (!) 127  Resp: 14 20  Temp: 97.9 F (36.6 C) 97.8 F (36.6 C)  SpO2: 96% 97%    Body mass index is 23.08 kg/m.  Intake/Output Summary (Last 24 hours) at 12/02/2022 1823 Last data filed at 12/02/2022 1447 Gross per 24 hour  Intake 0 ml  Output 400 ml  Net -400 ml     Sclerae unicteric  Oropharynx clear  No peripheral adenopathy  Lungs clear -- no rales or rhonchi  Heart regular rate and rhythm  Abdomen slightly distended, the J feeding tube on the left side abdomen is covered by large amount of gauze in a towel, with yellowish discharge  MSK no focal spinal tenderness, no peripheral edema  Neuro nonfocal    CBG (last 3)  Recent Labs    12/02/22 0736 12/02/22 1123 12/02/22 1617  GLUCAP 208* 204* 182*     Labs:   Urine Studies No results for input(s): "UHGB", "CRYS" in the last 72 hours.  Invalid input(s): "UACOL", "UAPR", "USPG", "UPH", "UTP", "UGL", "UKET", "UBIL", "UNIT", "UROB", "ULEU", "UEPI", "UWBC", "URBC", "UBAC", "CAST", "UCOM", "BILUA"  Basic Metabolic Panel: Recent Labs  Lab 11/28/22 1012 12/21/2022 1130 12/19/2022 1625 11/30/22 0500 12/01/22 0414 12/02/22 0758  NA 137 137  --  138 140 139  K 4.6 4.3  --  3.2* 3.4* 4.5  CL 103 102  --  100 105 107  CO2 27 25  --  24 25 24   GLUCOSE 123* 122*  --  103* 228* 214*  BUN 39* 41*   --  40* 48* 60*  CREATININE 0.63 0.62 0.63 0.69 0.95 0.87  CALCIUM 9.2 8.7*  --  9.0 8.6* 8.8*  MG  --   --   --  1.9 2.1 2.3  PHOS  --   --   --  6.5* 3.1 2.3*   GFR Estimated Creatinine Clearance: 87.5 mL/min (by C-G formula based on SCr of 0.87 mg/dL). Liver Function Tests: Recent Labs  Lab 11/28/22 1012 12/08/2022 1130 12/16/2022 1625 11/30/22 0500 12/01/22 0414 12/02/22 0758  AST 20 27  --  26 35 29  ALT 13 18  --  18 25 28   ALKPHOS 111 119  --  111 100 112  BILITOT 1.4* 1.7*  --  2.8* 1.7* 1.1  PROT 6.4* 6.2* 6.1* 6.2* 5.5* 5.4*  ALBUMIN 2.8* 2.0*  --  3.0* 2.4* 2.1*   No results for input(s): "LIPASE", "AMYLASE" in the last 168 hours. No results for input(s): "AMMONIA" in the last 168 hours. Coagulation profile Recent Labs  Lab 12/13/2022 1130  INR 1.2    CBC: Recent Labs  Lab 11/28/22 1012 12/16/2022 1130 12/11/2022 1530 11/30/22 0500 12/01/22 0414  WBC 23.3* 23.5* 20.8* 18.1* 18.7*  NEUTROABS 19.5* 19.7*  --   --  16.5*  HGB 10.6* 10.5* 10.0* 9.1*  Steven Wolf   DOB:31-Aug-1955   VO#:[id number]   VQQ#:[id number]  Medical oncology follow-up  Subjective: Patient is well-known to me, under my care for his metastatic gastric cancer.  He is on first-line immunotherapy, which started about 3-4 weeks ago.  He presented with worsening leakage from the J-tube, shortness of breath on exertion, and leg edema.  He underwent bilateral thoracentesis.  His main concern is the leakage from his feeding tube, which has been packed with gauze and towel, but is gown and pad are still wet from the leakage.   Objective:  Vitals:   12/02/22 1246 12/02/22 1447  BP: 101/76 117/78  Pulse: (!) 128 (!) 127  Resp: 14 20  Temp: 97.9 F (36.6 C) 97.8 F (36.6 C)  SpO2: 96% 97%    Body mass index is 23.08 kg/m.  Intake/Output Summary (Last 24 hours) at 12/02/2022 1823 Last data filed at 12/02/2022 1447 Gross per 24 hour  Intake 0 ml  Output 400 ml  Net -400 ml     Sclerae unicteric  Oropharynx clear  No peripheral adenopathy  Lungs clear -- no rales or rhonchi  Heart regular rate and rhythm  Abdomen slightly distended, the J feeding tube on the left side abdomen is covered by large amount of gauze in a towel, with yellowish discharge  MSK no focal spinal tenderness, no peripheral edema  Neuro nonfocal    CBG (last 3)  Recent Labs    12/02/22 0736 12/02/22 1123 12/02/22 1617  GLUCAP 208* 204* 182*     Labs:   Urine Studies No results for input(s): "UHGB", "CRYS" in the last 72 hours.  Invalid input(s): "UACOL", "UAPR", "USPG", "UPH", "UTP", "UGL", "UKET", "UBIL", "UNIT", "UROB", "ULEU", "UEPI", "UWBC", "URBC", "UBAC", "CAST", "UCOM", "BILUA"  Basic Metabolic Panel: Recent Labs  Lab 11/28/22 1012 12/21/2022 1130 12/19/2022 1625 11/30/22 0500 12/01/22 0414 12/02/22 0758  NA 137 137  --  138 140 139  K 4.6 4.3  --  3.2* 3.4* 4.5  CL 103 102  --  100 105 107  CO2 27 25  --  24 25 24   GLUCOSE 123* 122*  --  103* 228* 214*  BUN 39* 41*   --  40* 48* 60*  CREATININE 0.63 0.62 0.63 0.69 0.95 0.87  CALCIUM 9.2 8.7*  --  9.0 8.6* 8.8*  MG  --   --   --  1.9 2.1 2.3  PHOS  --   --   --  6.5* 3.1 2.3*   GFR Estimated Creatinine Clearance: 87.5 mL/min (by C-G formula based on SCr of 0.87 mg/dL). Liver Function Tests: Recent Labs  Lab 11/28/22 1012 12/08/2022 1130 12/16/2022 1625 11/30/22 0500 12/01/22 0414 12/02/22 0758  AST 20 27  --  26 35 29  ALT 13 18  --  18 25 28   ALKPHOS 111 119  --  111 100 112  BILITOT 1.4* 1.7*  --  2.8* 1.7* 1.1  PROT 6.4* 6.2* 6.1* 6.2* 5.5* 5.4*  ALBUMIN 2.8* 2.0*  --  3.0* 2.4* 2.1*   No results for input(s): "LIPASE", "AMYLASE" in the last 168 hours. No results for input(s): "AMMONIA" in the last 168 hours. Coagulation profile Recent Labs  Lab 12/13/2022 1130  INR 1.2    CBC: Recent Labs  Lab 11/28/22 1012 12/16/2022 1130 12/11/2022 1530 11/30/22 0500 12/01/22 0414  WBC 23.3* 23.5* 20.8* 18.1* 18.7*  NEUTROABS 19.5* 19.7*  --   --  16.5*  HGB 10.6* 10.5* 10.0* 9.1*

## 2022-12-02 NOTE — Plan of Care (Signed)

## 2022-12-02 NOTE — Progress Notes (Signed)
Chaplain met with Steven Wolf to provide support.  He was clear on his goal to prioritize the quality of his time over the quantity; and he is at peace with a focus on comfort even if it reduces the time he has. His wife is also at peace with this decision.  He is very bothered by the leaking of his J-tube and feels that no one has yet been able to tell him how having a leaky j-tube is going to improve the quality of his life. Chaplain encouraged him to be specific with his questions for the medical team and to communicate his goals clearly with each team member.    [address], Bcc Pager, [phone]

## 2022-12-02 NOTE — Inpatient Diabetes Management (Signed)
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 Possible infection around his J-tube leakage J-tube 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 Shortness of breath Pleural effusion bilateral Peripheral edema Hypoalbuminemia with third spacing  History: DM2  Home DM Meds: Novolog 2-15 units per SSI  Current Orders: Novolog Moderate Correction Scale/ SSI (0-15 units) Q4 hours     MD- Note Novolog SSi increased to the 0-15 unit scale this AM  Pt getting TPN 87.5 cc/hr  If pt continues to have CBGs >200, and if within goals of care, may also consider starting low dose weight based basal insulin while pt getting TPN: Semglee 7 units Daily (0.1 units/kg)     --Will follow patient during hospitalization--  Ambrose Finland RN, MSN, CDCES Diabetes Coordinator Inpatient Glycemic Control Team Team Pager: [phone] (8a-5p)

## 2022-12-02 NOTE — Progress Notes (Signed)
PHARMACY - TOTAL PARENTERAL NUTRITION CONSULT NOTE   Indication: continuation of chronic home TPN  Patient Measurements: Height: 6' (182.9 cm) Weight: 77.2 kg (170 lb 3.1 oz) IBW/kg (Calculated) : 77.6 TPN AdjBW (KG): 79.1 Body mass index is 23.08 kg/m. 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 Does have J-G tube for feeding/venting, but has been unable to tolerate any jejunal feeding recently. Presents 9/6 with pleural effusions, abdominal distention and ascites, LE edema, and persistent leaking around J-tube insertion site which has worsened, and now appears purulent. 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 Noted to have stable chronic leukocytosis at baseline. Patient will be admitted for rule-out of sepsis; Pharmacy to continue TPN while admitted.  Pharmacist contacted home infusion pharmacist Jill Alexanders with [facility] [phone])  Patient Measurements: Estimated body mass index is 23.08 kg/m as calculated from the following:   Height as of this encounter: 6' (1.829 m).   Weight as of this encounter: 77.2 kg (170 lb 3.1 oz).  Intake/Output: Net I/O: +799.7 mL/24 hrs UOP: -550 mL/24 hrs 9/6 S/p L  thoracentesis w/ 1700 mls out 9/6 lasix 100 mg total IV x 1; 9/7 lasix 60 IV x 1 9/6 albumin 25 gm q6h x 4 doses 9/7 s/p R thora w/ 900 cc out 9/7 s/p paracentesis w/ 1700 cc out  Estimated Nutritional Needs - Per RD recommendations on 11/30/22 kCal: 2400-2600 Protein: 110-130 grams Fluid: > 2.4 L  [facility] TPN formula:  2100 mls over 12 hours providing 2012 total Kcals, 115 gm protein Lytes per 24 hrs: Na 105: K 107; CL 110: ace 168, Phos 36.5; Ca 10, Mag 10  TPN @ 87.5 mls/hr will provide ~ 115.5 gm protein & ~ 2306 Kcals for 100% support  Recent Labs    12/01/22 0414 12/02/22 0758  NA 140 139  K 3.4* 4.5  CL 105 107  CO2 25 24  GLUCOSE 228* 214*  BUN  48* 60*  CREATININE 0.95 0.87  CALCIUM 8.6* 8.8*  PHOS 3.1 2.3*  MG 2.1 2.3  ALBUMIN 2.4* 2.1*  ALKPHOS 100 112  AST 35 29  ALT 25 28  BILITOT 1.7* 1.1  TRIG  --  133   Insulin Requirements: no hx DM, no insulin in TPN PTA.  -CBGs 153 - 214 on TPN @ 87.5 ml/hr ( Goal < 180) -13 units SSI/24 hrs  Lytes:  -Phos 2.3 (low) -Corrected Ca 10.3 (stable) Renal:   -BUN elevated at 60 -SCr WNL 9/7 Lasix 60 x 1, completed 4 doses IV albumin LFTs:  -Albumin 2.1 (low) -TGs 133 (stable) Current Nutrition: NPO, cyclic TPN PTA 9/8 TPN 87.5 ml/hr>> IVF: none Central access: PAC  TPN start date: PTA   PLAN  Now:  IV Kphos x1   At 1800 today: continue TPN at 87.5 ml/hr. TPN @ 87.5 mls/hr will provide ~ 115.5 gm protein & ~ 2306 Kcals for 100% support Electrolytes in TPN: Standard: decrease K back to 50, Cl:Ac ratio 1:1 TPN to contain standard multivitamins and trace elements. Change sensitive SSI q4h to moderate SSI q4h F/u CMP, Mg, phos 9/10 AM TPN lab panels on Mondays & Thursdays.    Cherylin Mylar, PharmD Clinical Pharmacist  9/9/20249:12 AM

## 2022-12-02 NOTE — Progress Notes (Signed)
Pharmacy Antibiotic Note  Steven Wolf is a 67 y.o. male with PMH stage IV gastric adenocarcinoma with peritoneal mets, s/p J-G tube placement and chronic TPN, presents 9/6 w/ SOB and abdominal distention. Found to be volume overloaded with bilateral pleural effusions and abdominal ascites. Given immunosuppression, Pharmacy has been consulted for cefepime dosing.  Thoracentesis from 9/6 yielded 1700 mL fluid. Thoracentesis from 9/7 yielded 900 mL fluid. All cultures with results are no growth. Patient to follow-up with oncology today. Surgery recommending to remove J-tube if patient pursues hospice/comfort measures.   Plan: Continue cefepime 2 g IV q8 hr Flagyl per MD; dosing appropriate F/u TOC plans and antibiotic de-escalation/LOT  Height: 6' (182.9 cm) Weight: 77.2 kg (170 lb 3.1 oz) IBW/kg (Calculated) : 77.6  Temp (24hrs), Avg:97.6 F (36.4 C), Min:97.4 F (36.3 C), Max:98.2 F (36.8 C)  Recent Labs  Lab 11/28/22 1012 12/13/2022 1130 12/09/2022 1228 12/21/2022 1530 12/11/2022 1625 11/30/22 0500 12/01/22 0414  WBC 23.3* 23.5*  --  20.8*  --  18.1* 18.7*  CREATININE 0.63 0.62  --   --  0.63 0.69 0.95  LATICACIDVEN  --   --  1.8  --   --   --   --     Estimated Creatinine Clearance: 80.1 mL/min (by C-G formula based on SCr of 0.95 mg/dL).    Allergies  Allergen Reactions   Ramipril Other (See Comments)    Elevated liver enzymes   Penicillins Other (See Comments)    ? Reaction (as child)    Antimicrobials this admission: 9/6 vancomycin >> 9/7 9/6 cefepime >>  9/6 metronidazole >>   Dose adjustments this admission: N/A  Microbiology results: 9/6 BCx: NG x2 days 9/6 UCx: ngF 9/6 MRSA PCR: negative 9/7 pleural fluid: gram stain neg 9/7 pleural fluid: AFB, Fungus: in process 9/7 peritoneal fluid: NG <24 hrs 9/7 pleural fluid: NG <24 hrs   Thank you for allowing pharmacy to be a part of this patient's care.  Cherylin Mylar, PharmD Clinical Pharmacist   9/9/20247:29 AM

## 2022-12-02 NOTE — Progress Notes (Signed)
Daily Progress Note   Patient Name: Steven Wolf       Date: 12/02/2022 DOB: 1955-05-20  Age: 67 y.o. MRN#: [id number] Attending Physician: Lanae Boast, MD Primary Care Physician: Pincus Sanes, MD Admit Date: 11/26/2022  Reason for Consultation/Follow-up: Establishing goals of care  Subjective: Continues to endorse preference for hospice approach.    Length of Stay: 3  Current Medications: Scheduled Meds:   Chlorhexidine Gluconate Cloth  6 each Topical Daily   enoxaparin (LOVENOX) injection  40 mg Subcutaneous Q24H   insulin aspart  0-15 Units Subcutaneous Q4H   nystatin  5 mL Oral QID   mouth rinse  15 mL Mouth Rinse 4 times per day   sodium chloride flush  3 mL Intravenous Q12H    Continuous Infusions:  ceFEPime (MAXIPIME) IV 2 g (12/02/22 0603)   metronidazole 500 mg (12/02/22 1107)   potassium PHOSPHATE IVPB (in mmol) 15 mmol (12/02/22 1101)   TPN ADULT (ION) 87.5 mL/hr at 12/01/22 1802   TPN ADULT (ION)      PRN Meds: acetaminophen **OR** acetaminophen, ipratropium, levalbuterol, lip balm, morphine injection, ondansetron **OR** ondansetron (ZOFRAN) IV, mouth rinse, Zinc Oxide  Physical Exam         Awake alert Sitting up in bed Appears with generalized weakness S1-S2 Has J-tube Some lower extremity edema No focal deficits Oriented  Vital Signs: BP 101/76 (BP Location: Right Arm)   Pulse (!) 128   Temp 97.9 F (36.6 C)   Resp 14   Ht 6' (1.829 m)   Wt 77.2 kg   SpO2 96%   BMI 23.08 kg/m  SpO2: SpO2: 96 % O2 Device: O2 Device: Nasal Cannula O2 Flow Rate: O2 Flow Rate (L/min): 2 L/min  Intake/output summary:  Intake/Output Summary (Last 24 hours) at 12/02/2022 1437 Last data filed at 12/02/2022 7829 Gross per 24 hour  Intake 182.68 ml  Output 400 ml   Net -217.32 ml   LBM: Last BM Date :  (PTA) Baseline Weight: Weight: 79.1 kg Most recent weight: Weight: 77.2 kg       Palliative Assessment/Data:      Patient Active Problem List   Diagnosis Date Noted   Sepsis (HCC) 12/13/2022   Port-A-Cath in place 11/21/2022   Medication management 10/23/2022   Palliative care by specialist 10/22/2022  Daily Progress Note   Patient Name: Steven Wolf       Date: 12/02/2022 DOB: 1955-05-20  Age: 67 y.o. MRN#: [id number] Attending Physician: Lanae Boast, MD Primary Care Physician: Pincus Sanes, MD Admit Date: 11/26/2022  Reason for Consultation/Follow-up: Establishing goals of care  Subjective: Continues to endorse preference for hospice approach.    Length of Stay: 3  Current Medications: Scheduled Meds:   Chlorhexidine Gluconate Cloth  6 each Topical Daily   enoxaparin (LOVENOX) injection  40 mg Subcutaneous Q24H   insulin aspart  0-15 Units Subcutaneous Q4H   nystatin  5 mL Oral QID   mouth rinse  15 mL Mouth Rinse 4 times per day   sodium chloride flush  3 mL Intravenous Q12H    Continuous Infusions:  ceFEPime (MAXIPIME) IV 2 g (12/02/22 0603)   metronidazole 500 mg (12/02/22 1107)   potassium PHOSPHATE IVPB (in mmol) 15 mmol (12/02/22 1101)   TPN ADULT (ION) 87.5 mL/hr at 12/01/22 1802   TPN ADULT (ION)      PRN Meds: acetaminophen **OR** acetaminophen, ipratropium, levalbuterol, lip balm, morphine injection, ondansetron **OR** ondansetron (ZOFRAN) IV, mouth rinse, Zinc Oxide  Physical Exam         Awake alert Sitting up in bed Appears with generalized weakness S1-S2 Has J-tube Some lower extremity edema No focal deficits Oriented  Vital Signs: BP 101/76 (BP Location: Right Arm)   Pulse (!) 128   Temp 97.9 F (36.6 C)   Resp 14   Ht 6' (1.829 m)   Wt 77.2 kg   SpO2 96%   BMI 23.08 kg/m  SpO2: SpO2: 96 % O2 Device: O2 Device: Nasal Cannula O2 Flow Rate: O2 Flow Rate (L/min): 2 L/min  Intake/output summary:  Intake/Output Summary (Last 24 hours) at 12/02/2022 1437 Last data filed at 12/02/2022 7829 Gross per 24 hour  Intake 182.68 ml  Output 400 ml   Net -217.32 ml   LBM: Last BM Date :  (PTA) Baseline Weight: Weight: 79.1 kg Most recent weight: Weight: 77.2 kg       Palliative Assessment/Data:      Patient Active Problem List   Diagnosis Date Noted   Sepsis (HCC) 12/13/2022   Port-A-Cath in place 11/21/2022   Medication management 10/23/2022   Palliative care by specialist 10/22/2022  Daily Progress Note   Patient Name: Steven Wolf       Date: 12/02/2022 DOB: 1955-05-20  Age: 67 y.o. MRN#: [id number] Attending Physician: Lanae Boast, MD Primary Care Physician: Pincus Sanes, MD Admit Date: 12/15/2022  Reason for Consultation/Follow-up: Establishing goals of care  Subjective: Continues to endorse preference for hospice approach.    Length of Stay: 3  Current Medications: Scheduled Meds:   Chlorhexidine Gluconate Cloth  6 each Topical Daily   enoxaparin (LOVENOX) injection  40 mg Subcutaneous Q24H   insulin aspart  0-15 Units Subcutaneous Q4H   nystatin  5 mL Oral QID   mouth rinse  15 mL Mouth Rinse 4 times per day   sodium chloride flush  3 mL Intravenous Q12H    Continuous Infusions:  ceFEPime (MAXIPIME) IV 2 g (12/02/22 0603)   metronidazole 500 mg (12/02/22 1107)   potassium PHOSPHATE IVPB (in mmol) 15 mmol (12/02/22 1101)   TPN ADULT (ION) 87.5 mL/hr at 12/01/22 1802   TPN ADULT (ION)      PRN Meds: acetaminophen **OR** acetaminophen, ipratropium, levalbuterol, lip balm, morphine injection, ondansetron **OR** ondansetron (ZOFRAN) IV, mouth rinse, Zinc Oxide  Physical Exam         Awake alert Sitting up in bed Appears with generalized weakness S1-S2 Has J-tube Some lower extremity edema No focal deficits Oriented  Vital Signs: BP 101/76 (BP Location: Right Arm)   Pulse (!) 128   Temp 97.9 F (36.6 C)   Resp 14   Ht 6' (1.829 m)   Wt 77.2 kg   SpO2 96%   BMI 23.08 kg/m  SpO2: SpO2: 96 % O2 Device: O2 Device: Nasal Cannula O2 Flow Rate: O2 Flow Rate (L/min): 2 L/min  Intake/output summary:  Intake/Output Summary (Last 24 hours) at 12/02/2022 1437 Last data filed at 12/02/2022 7829 Gross per 24 hour  Intake 182.68 ml  Output 400 ml   Net -217.32 ml   LBM: Last BM Date :  (PTA) Baseline Weight: Weight: 79.1 kg Most recent weight: Weight: 77.2 kg       Palliative Assessment/Data:      Patient Active Problem List   Diagnosis Date Noted   Sepsis (HCC) 12/01/2022   Port-A-Cath in place 11/21/2022   Medication management 10/23/2022   Palliative care by specialist 10/22/2022

## 2022-12-03 ENCOUNTER — Other Ambulatory Visit: Payer: Self-pay

## 2022-12-03 DIAGNOSIS — Z66 Do not resuscitate: Secondary | ICD-10-CM

## 2022-12-03 DIAGNOSIS — Z515 Encounter for palliative care: Secondary | ICD-10-CM | POA: Diagnosis not present

## 2022-12-03 DIAGNOSIS — C168 Malignant neoplasm of overlapping sites of stomach: Secondary | ICD-10-CM | POA: Diagnosis not present

## 2022-12-03 DIAGNOSIS — Z7189 Other specified counseling: Secondary | ICD-10-CM | POA: Diagnosis not present

## 2022-12-03 DIAGNOSIS — A419 Sepsis, unspecified organism: Secondary | ICD-10-CM | POA: Diagnosis not present

## 2022-12-03 LAB — GLUCOSE, CAPILLARY
Glucose-Capillary: 168 mg/dL — ABNORMAL HIGH (ref 70–99)
Glucose-Capillary: 170 mg/dL — ABNORMAL HIGH (ref 70–99)
Glucose-Capillary: 175 mg/dL — ABNORMAL HIGH (ref 70–99)
Glucose-Capillary: 181 mg/dL — ABNORMAL HIGH (ref 70–99)
Glucose-Capillary: 188 mg/dL — ABNORMAL HIGH (ref 70–99)
Glucose-Capillary: 189 mg/dL — ABNORMAL HIGH (ref 70–99)

## 2022-12-03 LAB — COMPREHENSIVE METABOLIC PANEL
ALT: 28 U/L (ref 0–44)
AST: 33 U/L (ref 15–41)
Albumin: 2.3 g/dL — ABNORMAL LOW (ref 3.5–5.0)
Alkaline Phosphatase: 143 U/L — ABNORMAL HIGH (ref 38–126)
Anion gap: 10 (ref 5–15)
BUN: 76 mg/dL — ABNORMAL HIGH (ref 8–23)
CO2: 20 mmol/L — ABNORMAL LOW (ref 22–32)
Calcium: 9.7 mg/dL (ref 8.9–10.3)
Chloride: 110 mmol/L (ref 98–111)
Creatinine, Ser: 1.12 mg/dL (ref 0.61–1.24)
GFR, Estimated: >60 mL/min (ref >60)
Glucose, Bld: 196 mg/dL — ABNORMAL HIGH (ref 70–99)
Potassium: 5 mmol/L (ref 3.5–5.1)
Sodium: 140 mmol/L (ref 135–145)
Total Bilirubin: 1.1 mg/dL (ref 0.3–1.2)
Total Protein: 5.9 g/dL — ABNORMAL LOW (ref 6.5–8.1)

## 2022-12-03 LAB — PHOSPHORUS: Phosphorus: 3.4 mg/dL (ref 2.5–4.6)

## 2022-12-03 LAB — ACID FAST SMEAR (AFB, MYCOBACTERIA): Acid Fast Smear: NEGATIVE

## 2022-12-03 LAB — MAGNESIUM: Magnesium: 2.5 mg/dL — ABNORMAL HIGH (ref 1.7–2.4)

## 2022-12-03 MED ORDER — METRONIDAZOLE 500 MG/100ML IV SOLN
500.0000 mg | Freq: Two times a day (BID) | INTRAVENOUS | Status: DC
  Administered 2022-12-03 – 2022-12-06 (×6): 500 mg via INTRAVENOUS
  Filled 2022-12-03 (×6): qty 100

## 2022-12-03 MED ORDER — SODIUM CHLORIDE 0.9 % IV SOLN
2.0000 g | Freq: Three times a day (TID) | INTRAVENOUS | Status: DC
  Administered 2022-12-03 – 2022-12-04 (×4): 2 g via INTRAVENOUS
  Filled 2022-12-03 (×4): qty 12.5

## 2022-12-03 MED ORDER — TRAVASOL 10 % IV SOLN
INTRAVENOUS | Status: AC
  Filled 2022-12-03: qty 1155

## 2022-12-03 NOTE — Progress Notes (Signed)
Chaplain followed up with Steven Wolf today.  He was grateful to have the J tube gone and feels comfortable and at peace with his decisions and the direction of his care.  He did not have any spiritual or emotional needs at this time.  Chaplain will continue to follow as able, but please page as needs arise.  [address] Bcc Pager, [phone]

## 2022-12-03 NOTE — Progress Notes (Signed)
Subjective/Chief Complaint: Ongoing leakage from J tube site with extensive skin excoriation   Objective: Vital signs in last 24 hours: Temp:  [97.3 F (36.3 C)-98.4 F (36.9 C)] 97.8 F (36.6 C) (09/10 0413) Pulse Rate:  [122-137] 122 (09/10 0413) Resp:  [14-20] 18 (09/10 0413) BP: (83-117)/(64-80) 108/80 (09/10 0413) SpO2:  [96 %-97 %] 97 % (09/10 0413) Last BM Date :  (PTA)  Intake/Output from previous day: 09/09 0701 - 09/10 0700 In: 0  Out: 625 [Urine:625] Intake/Output this shift: No intake/output data recorded.  Alert, calm Abd s/nt, mildly distended. J tube removed.   Lab Results:  Recent Labs    12/01/22 0414  WBC 18.7*  HGB 10.1*  HCT 31.2*  PLT 303   BMET Recent Labs    12/02/22 0758 12/03/22 0522  NA 139 140  K 4.5 5.0  CL 107 110  CO2 24 20*  GLUCOSE 214* 196*  BUN 60* 76*  CREATININE 0.87 1.12  CALCIUM 8.8* 9.7   PT/INR No results for input(s): "LABPROT", "INR" in the last 72 hours. ABG No results for input(s): "PHART", "HCO3" in the last 72 hours.  Invalid input(s): "PCO2", "PO2"  Studies/Results: VAS [place] LOWER EXTREMITY VENOUS (DVT)  Result Date: 12/02/2022  Lower Venous DVT Study Patient Name:  Steven Wolf  Date of Exam:   12/01/2022 Medical Rec #: [id number]          Accession #:    [id number] Date of Birth: Oct 15, 1955           Patient Gender: M Patient Age:   67 years Exam Location[facility]  [facility] Procedure:      VAS [place] LOWER EXTREMITY VENOUS (DVT) Referring Phys: Marguerita Merles --------------------------------------------------------------------------------  Indications: Swelling, and Edema.  Risk Factors: Surgery 11/22/22. Comparison Study: No prior study Performing Technologist: Shona Simpson  Examination Guidelines: A complete evaluation includes B-mode imaging, spectral Doppler, color Doppler, and power Doppler as needed of all accessible portions of each vessel. Bilateral testing is considered an integral part of a complete  examination. Limited examinations for reoccurring indications may be performed as noted. The reflux portion of the exam is performed with the patient in reverse Trendelenburg.  +---------+---------------+---------+-----------+----------+--------------+ RIGHT    CompressibilityPhasicitySpontaneityPropertiesThrombus Aging +---------+---------------+---------+-----------+----------+--------------+ CFV      Full           Yes      Yes                                 +---------+---------------+---------+-----------+----------+--------------+ SFJ      Full                                                        +---------+---------------+---------+-----------+----------+--------------+ FV Prox  Full                                                        +---------+---------------+---------+-----------+----------+--------------+ FV Mid   Full                                                        +---------+---------------+---------+-----------+----------+--------------+  FV DistalFull                                                        +---------+---------------+---------+-----------+----------+--------------+ PFV      Full                                                        +---------+---------------+---------+-----------+----------+--------------+ POP      Full           Yes      Yes                                 +---------+---------------+---------+-----------+----------+--------------+ PTV      Full                                                        +---------+---------------+---------+-----------+----------+--------------+ PERO     Full                                                        +---------+---------------+---------+-----------+----------+--------------+   +---------+---------------+---------+-----------+----------+--------------+ LEFT     CompressibilityPhasicitySpontaneityPropertiesThrombus Aging  +---------+---------------+---------+-----------+----------+--------------+ CFV      Full           Yes      Yes                                 +---------+---------------+---------+-----------+----------+--------------+ SFJ      Full                                                        +---------+---------------+---------+-----------+----------+--------------+ FV Prox  Full                                                        +---------+---------------+---------+-----------+----------+--------------+ FV Mid   Full                                                        +---------+---------------+---------+-----------+----------+--------------+ FV DistalFull                                                        +---------+---------------+---------+-----------+----------+--------------+  PFV      Full                                                        +---------+---------------+---------+-----------+----------+--------------+ POP      Full           Yes      Yes                                 +---------+---------------+---------+-----------+----------+--------------+ PTV      Full                                                        +---------+---------------+---------+-----------+----------+--------------+ PERO     Full                                                        +---------+---------------+---------+-----------+----------+--------------+     Summary: BILATERAL: - No evidence of deep vein thrombosis seen in the lower extremities, bilaterally. -No evidence of popliteal cyst, bilaterally.   *See table(s) above for measurements and observations. Electronically signed by Coral Else MD on 12/02/2022 at 12:04:16 PM.    Final     Anti-infectives: Anti-infectives (From admission, onward)    Start     Dose/Rate Route Frequency Ordered Stop   12/17/2022 2200  metroNIDAZOLE (FLAGYL) IVPB 500 mg        500 mg 100 mL/hr over 60 Minutes  Intravenous Every 12 hours 12/23/2022 1404     12/16/2022 2200  ceFEPIme (MAXIPIME) 2 g in sodium chloride 0.9 % 100 mL IVPB        2 g 200 mL/hr over 30 Minutes Intravenous Every 8 hours 11/25/2022 1418     12/05/2022 2200  vancomycin (VANCOREADY) IVPB 1250 mg/250 mL  Status:  Discontinued        1,250 mg 166.7 mL/hr over 90 Minutes Intravenous Every 12 hours 12/09/2022 1418 11/30/22 0956   11/30/2022 1230  ceFEPIme (MAXIPIME) 2 g in sodium chloride 0.9 % 100 mL IVPB        2 g 200 mL/hr over 30 Minutes Intravenous  Once 12/09/2022 1224 12/06/2022 1327   12/11/2022 1230  metroNIDAZOLE (FLAGYL) IVPB 500 mg        500 mg 100 mL/hr over 60 Minutes Intravenous  Once 12/23/2022 1224 12/09/2022 1401   12/04/2022 1230  vancomycin (VANCOCIN) IVPB 1000 mg/200 mL premix  Status:  Discontinued        1,000 mg 200 mL/hr over 60 Minutes Intravenous  Once 12/13/2022 1224 11/25/2022 1228   12/11/2022 1230  vancomycin (VANCOREADY) IVPB 1500 mg/300 mL        1,500 mg 150 mL/hr over 120 Minutes Intravenous  Once 12/23/2022 1228 12/19/2022 1600       Assessment/Plan:  J tube removed this AM. WOC c/s for eakins, continue local skin care   LOS: 4 days    Berna Bue 12/03/2022

## 2022-12-03 NOTE — Progress Notes (Addendum)
PHARMACY - TOTAL PARENTERAL NUTRITION CONSULT NOTE   Indication: continuation of chronic home TPN  Patient Measurements: Height: 6' (182.9 cm) Weight: 77.2 kg (170 lb 3.1 oz) IBW/kg (Calculated) : 77.6 TPN AdjBW (KG): 79.1 Body mass index is 23.08 kg/m. 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 Does have J-G tube for feeding/venting, but has been unable to tolerate any jejunal feeding recently. Presents 9/6 with pleural effusions, abdominal distention and ascites, LE edema, and persistent leaking around J-tube insertion site which has worsened, and now appears purulent. 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 Noted to have stable chronic leukocytosis at baseline. Patient will be admitted for rule-out of sepsis; Pharmacy to continue TPN while admitted.  Pharmacist contacted home infusion pharmacist Jill Alexanders with [facility] [phone])  Patient Measurements: Estimated body mass index is 23.08 kg/m as calculated from the following:   Height as of this encounter: 6' (1.829 m).   Weight as of this encounter: 77.2 kg (170 lb 3.1 oz).  Intake/Output: Net I/O: -625 mL/24 hrs (intake not recorded) UOP: -625 mL/24 hrs 9/6 S/p L  thoracentesis w/ 1700 mls out 9/6 lasix 100 mg total IV x 1; 9/7 lasix 60 IV x 1 9/6 albumin 25 gm q6h x 4 doses 9/7 s/p R thora w/ 900 cc out 9/7 s/p paracentesis w/ 1700 cc out  Estimated Nutritional Needs - Per RD recommendations on 11/30/22 kCal: 2400-2600 Protein: 110-130 grams Fluid: > 2.4 L  [facility] TPN formula:  2100 mls over 12 hours providing 2012 total Kcals, 115 gm protein Lytes per 24 hrs: Na 105: K 107; CL 110: ace 168, Phos 36.5; Ca 10, Mag 10  TPN @ 87.5 mls/hr will provide ~ 115.5 gm protein & ~ 2306 Kcals for 100% support  Recent Labs    12/02/22 0758 12/03/22 0522  NA 139 140  K 4.5 5.0  CL 107 110  CO2 24 20*   GLUCOSE 214* 196*  BUN 60* 76*  CREATININE 0.87 1.12  CALCIUM 8.8* 9.7  PHOS 2.3* 3.4  MG 2.3 2.5*  ALBUMIN 2.1* 2.3*  ALKPHOS 112 143*  AST 29 33  ALT 28 28  BILITOT 1.1 1.1  TRIG 133  --    Insulin Requirements: no hx DM, no insulin in TPN PTA.  -CBGs 165-196 on TPN @ 87.5 ml/hr ( Goal < 180) since increasing to moderate SSI 9/9 -20 units SSI/24 hrs  Lytes:  -K 5 (higher end of normal range) -CO2 20 (slightly low) - will follow-up again tomorrow  -Mg 2.5 (slightly elevated) -Corrected Ca 11.1 (elevated) Renal:   -BUN elevated at 76 -Scr slightly elevated at 1.12 LFTs:  -Albumin 2.3 (low) -TGs 133 (stable) Current Nutrition: NPO, cyclic TPN PTA 9/8 TPN 87.5 ml/hr>> IVF: none Central access: PAC  TPN start date: PTA   PLAN  At 1800 today: continue TPN at 87.5 ml/hr. TPN @ 87.5 mls/hr will provide ~ 115.5 gm protein & ~ 2306 Kcals for 100% support Electrolytes in TPN: Standard: decrease K to 40 mEq/L, decrease Ca to 4 mEq/L, decrease Mg to 4 mEq/L, Cl:Ac ratio 1:1 TPN to contain standard multivitamins and trace elements. Continue moderate SSI q4h F/u CMP, Mg, phos 9/11 AM TPN lab panels on Mondays & Thursdays.    Cherylin Mylar, PharmD Clinical Pharmacist  9/10/20247:07 AM

## 2022-12-03 NOTE — Progress Notes (Signed)
Steven Wolf   DOB:Aug 26, 1955   GN#:[id number]   HQI#:[id number]  Medical oncology follow-up  Subjective: Patient feels much better after his J-tube was removed.  He has a ostomy bag covers the J-tube insertion site, with significant dark green liquid output.  No other new complaints.  Objective:  Vitals:   12/03/22 1506 12/03/22 1855  BP: 109/81 108/74  Pulse: (!) 126 (!) 126  Resp: 14   Temp: (!) 97.4 F (36.3 C)   SpO2: 97% 96%    Body mass index is 23.08 kg/m.  Intake/Output Summary (Last 24 hours) at 12/03/2022 1900 Last data filed at 12/03/2022 0813 Gross per 24 hour  Intake --  Output 400 ml  Net -400 ml     Sclerae unicteric  Oropharynx clear  No peripheral adenopathy  Lungs clear -- no rales or rhonchi  Heart regular rate and rhythm  Abdomen slightly distended, (+) pouch bag at left side abdomen with dark green liquid  MSK no focal spinal tenderness, no peripheral edema  Neuro nonfocal    CBG (last 3)  Recent Labs    12/03/22 0752 12/03/22 1229 12/03/22 1614  GLUCAP 189* 181* 168*     Labs:   Urine Studies No results for input(s): "UHGB", "CRYS" in the last 72 hours.  Invalid input(s): "UACOL", "UAPR", "USPG", "UPH", "UTP", "UGL", "UKET", "UBIL", "UNIT", "UROB", "ULEU", "UEPI", "UWBC", "URBC", "UBAC", "CAST", "UCOM", "BILUA"  Basic Metabolic Panel: Recent Labs  Lab 12/03/2022 1130 12/10/2022 1625 11/30/22 0500 12/01/22 0414 12/02/22 0758 12/03/22 0522  NA 137  --  138 140 139 140  K 4.3  --  3.2* 3.4* 4.5 5.0  CL 102  --  100 105 107 110  CO2 25  --  24 25 24  20*  GLUCOSE 122*  --  103* 228* 214* 196*  BUN 41*  --  40* 48* 60* 76*  CREATININE 0.62 0.63 0.69 0.95 0.87 1.12  CALCIUM 8.7*  --  9.0 8.6* 8.8* 9.7  MG  --   --  1.9 2.1 2.3 2.5*  PHOS  --   --  6.5* 3.1 2.3* 3.4   GFR Estimated Creatinine Clearance: 68 mL/min (by C-G formula based on SCr of 1.12 mg/dL). Liver Function Tests: Recent Labs  Lab 12/13/2022 1130 12/06/2022 1625  11/30/22 0500 12/01/22 0414 12/02/22 0758 12/03/22 0522  AST 27  --  26 35 29 33  ALT 18  --  18 25 28 28   ALKPHOS 119  --  111 100 112 143*  BILITOT 1.7*  --  2.8* 1.7* 1.1 1.1  PROT 6.2* 6.1* 6.2* 5.5* 5.4* 5.9*  ALBUMIN 2.0*  --  3.0* 2.4* 2.1* 2.3*   No results for input(s): "LIPASE", "AMYLASE" in the last 168 hours. No results for input(s): "AMMONIA" in the last 168 hours. Coagulation profile Recent Labs  Lab 11/28/2022 1130  INR 1.2    CBC: Recent Labs  Lab 11/28/22 1012 12/12/2022 1130 11/28/2022 1530 11/30/22 0500 12/01/22 0414  WBC 23.3* 23.5* 20.8* 18.1* 18.7*  NEUTROABS 19.5* 19.7*  --   --  16.5*  HGB 10.6* 10.5* 10.0* 9.1* 10.1*  HCT 31.8* 32.8* 31.5* 28.2* 31.2*  MCV 87.8 90.4 90.3 88.1 88.9  PLT 367 346 307 256 303   Cardiac Enzymes: No results for input(s): "CKTOTAL", "CKMB", "CKMBINDEX", "TROPONINI" in the last 168 hours. BNP: Invalid input(s): "POCBNP" CBG: Recent Labs  Lab 12/02/22 2356 12/03/22 0413 12/03/22 0752 12/03/22 1229 12/03/22 1614  GLUCAP 165* 188* 189* 181* 168*  A by PCR NEGATIVE NEGATIVE Final   Influenza B by PCR NEGATIVE NEGATIVE Final    Comment: (NOTE) The Xpert Xpress SARS-CoV-2/FLU/RSV plus assay is intended as an aid in the diagnosis of influenza from Nasopharyngeal swab specimens and should not be used as a sole basis for treatment. Nasal washings and aspirates are unacceptable for Xpert Xpress SARS-CoV-2/FLU/RSV testing.  Fact Sheet for Patients: BloggerCourse.com  Fact Sheet for Healthcare Providers: SeriousBroker.it  This test is not yet approved or cleared by the [place] FDA and has been authorized for detection and/or diagnosis of SARS-CoV-2 by FDA under an Emergency Use Authorization (EUA). This EUA will remain in effect (meaning this test can be used) for the duration of the COVID-19 declaration under Section 564(b)(1) of the Act, 21 U.S.C. section 360bbb-3(b)(1), unless the authorization is terminated or revoked.     Resp Syncytial Virus by PCR NEGATIVE NEGATIVE Final    Comment: (NOTE) Fact Sheet for Patients: BloggerCourse.com  Fact Sheet for Healthcare Providers: SeriousBroker.it  This test is not yet approved or cleared by the [place] FDA and has been  authorized for detection and/or diagnosis of SARS-CoV-2 by FDA under an Emergency Use Authorization (EUA). This EUA will remain in effect (meaning this test can be used) for the duration of the COVID-19 declaration under Section 564(b)(1) of the Act, 21 U.S.C. section 360bbb-3(b)(1), unless the authorization is terminated or revoked.  Performed at [facility], [address] [place], [place] [address]   MRSA Next Gen by PCR, Nasal     Status: None   Collection Time: 12/14/2022  3:45 PM   Specimen: Nasal Mucosa; Nasal Swab  Result Value Ref Range Status   MRSA by PCR Next Gen NOT DETECTED NOT DETECTED Final    Comment: (NOTE) The GeneXpert MRSA Assay (FDA approved for NASAL specimens only), is one component of a comprehensive MRSA colonization surveillance program. It is not intended to diagnose MRSA infection nor to guide or monitor treatment for MRSA infections. Test performance is not FDA approved in patients less than 73 years old. Performed at [facility], [address] [place], [place] [address]   Aerobic/Anaerobic Culture w Gram Stain (surgical/deep wound)     Status: None (Preliminary result)   Collection Time: 11/30/22  1:02 PM   Specimen: PATH Cytology Peritoneal fluid  Result Value Ref Range Status   Specimen Description   Final    PERITONEAL Performed at [facility], [address], [place], [place] [address]    Special Requests   Final    NONE Performed at [facility], [address], [place], [place] [address]    Gram Stain   Final    RARE WBC PRESENT, PREDOMINANTLY PMN NO ORGANISMS SEEN    Culture   Final    NO GROWTH 3 DAYS NO ANAEROBES ISOLATED; CULTURE IN PROGRESS FOR 5 DAYS Performed at [facility], [address], [place], [place] [address]    Report Status PENDING  Incomplete  Body fluid culture w Gram Stain     Status: None (Preliminary result)   Collection Time:  11/30/22  3:37 PM   Specimen: Pleural Fluid  Result Value Ref Range Status   Specimen Description   Final    PLEURAL Performed at [facility], [address] [place], [place] [address]    Special Requests   Final    NONE Performed at [facility], [address]  Steven Wolf   DOB:Aug 26, 1955   GN#:[id number]   HQI#:[id number]  Medical oncology follow-up  Subjective: Patient feels much better after his J-tube was removed.  He has a ostomy bag covers the J-tube insertion site, with significant dark green liquid output.  No other new complaints.  Objective:  Vitals:   12/03/22 1506 12/03/22 1855  BP: 109/81 108/74  Pulse: (!) 126 (!) 126  Resp: 14   Temp: (!) 97.4 F (36.3 C)   SpO2: 97% 96%    Body mass index is 23.08 kg/m.  Intake/Output Summary (Last 24 hours) at 12/03/2022 1900 Last data filed at 12/03/2022 0813 Gross per 24 hour  Intake --  Output 400 ml  Net -400 ml     Sclerae unicteric  Oropharynx clear  No peripheral adenopathy  Lungs clear -- no rales or rhonchi  Heart regular rate and rhythm  Abdomen slightly distended, (+) pouch bag at left side abdomen with dark green liquid  MSK no focal spinal tenderness, no peripheral edema  Neuro nonfocal    CBG (last 3)  Recent Labs    12/03/22 0752 12/03/22 1229 12/03/22 1614  GLUCAP 189* 181* 168*     Labs:   Urine Studies No results for input(s): "UHGB", "CRYS" in the last 72 hours.  Invalid input(s): "UACOL", "UAPR", "USPG", "UPH", "UTP", "UGL", "UKET", "UBIL", "UNIT", "UROB", "ULEU", "UEPI", "UWBC", "URBC", "UBAC", "CAST", "UCOM", "BILUA"  Basic Metabolic Panel: Recent Labs  Lab 12/03/2022 1130 12/10/2022 1625 11/30/22 0500 12/01/22 0414 12/02/22 0758 12/03/22 0522  NA 137  --  138 140 139 140  K 4.3  --  3.2* 3.4* 4.5 5.0  CL 102  --  100 105 107 110  CO2 25  --  24 25 24  20*  GLUCOSE 122*  --  103* 228* 214* 196*  BUN 41*  --  40* 48* 60* 76*  CREATININE 0.62 0.63 0.69 0.95 0.87 1.12  CALCIUM 8.7*  --  9.0 8.6* 8.8* 9.7  MG  --   --  1.9 2.1 2.3 2.5*  PHOS  --   --  6.5* 3.1 2.3* 3.4   GFR Estimated Creatinine Clearance: 68 mL/min (by C-G formula based on SCr of 1.12 mg/dL). Liver Function Tests: Recent Labs  Lab 12/13/2022 1130 12/06/2022 1625  11/30/22 0500 12/01/22 0414 12/02/22 0758 12/03/22 0522  AST 27  --  26 35 29 33  ALT 18  --  18 25 28 28   ALKPHOS 119  --  111 100 112 143*  BILITOT 1.7*  --  2.8* 1.7* 1.1 1.1  PROT 6.2* 6.1* 6.2* 5.5* 5.4* 5.9*  ALBUMIN 2.0*  --  3.0* 2.4* 2.1* 2.3*   No results for input(s): "LIPASE", "AMYLASE" in the last 168 hours. No results for input(s): "AMMONIA" in the last 168 hours. Coagulation profile Recent Labs  Lab 11/28/2022 1130  INR 1.2    CBC: Recent Labs  Lab 11/28/22 1012 12/12/2022 1130 11/28/2022 1530 11/30/22 0500 12/01/22 0414  WBC 23.3* 23.5* 20.8* 18.1* 18.7*  NEUTROABS 19.5* 19.7*  --   --  16.5*  HGB 10.6* 10.5* 10.0* 9.1* 10.1*  HCT 31.8* 32.8* 31.5* 28.2* 31.2*  MCV 87.8 90.4 90.3 88.1 88.9  PLT 367 346 307 256 303   Cardiac Enzymes: No results for input(s): "CKTOTAL", "CKMB", "CKMBINDEX", "TROPONINI" in the last 168 hours. BNP: Invalid input(s): "POCBNP" CBG: Recent Labs  Lab 12/02/22 2356 12/03/22 0413 12/03/22 0752 12/03/22 1229 12/03/22 1614  GLUCAP 165* 188* 189* 181* 168*  Steven Wolf   DOB:Aug 26, 1955   GN#:[id number]   HQI#:[id number]  Medical oncology follow-up  Subjective: Patient feels much better after his J-tube was removed.  He has a ostomy bag covers the J-tube insertion site, with significant dark green liquid output.  No other new complaints.  Objective:  Vitals:   12/03/22 1506 12/03/22 1855  BP: 109/81 108/74  Pulse: (!) 126 (!) 126  Resp: 14   Temp: (!) 97.4 F (36.3 C)   SpO2: 97% 96%    Body mass index is 23.08 kg/m.  Intake/Output Summary (Last 24 hours) at 12/03/2022 1900 Last data filed at 12/03/2022 0813 Gross per 24 hour  Intake --  Output 400 ml  Net -400 ml     Sclerae unicteric  Oropharynx clear  No peripheral adenopathy  Lungs clear -- no rales or rhonchi  Heart regular rate and rhythm  Abdomen slightly distended, (+) pouch bag at left side abdomen with dark green liquid  MSK no focal spinal tenderness, no peripheral edema  Neuro nonfocal    CBG (last 3)  Recent Labs    12/03/22 0752 12/03/22 1229 12/03/22 1614  GLUCAP 189* 181* 168*     Labs:   Urine Studies No results for input(s): "UHGB", "CRYS" in the last 72 hours.  Invalid input(s): "UACOL", "UAPR", "USPG", "UPH", "UTP", "UGL", "UKET", "UBIL", "UNIT", "UROB", "ULEU", "UEPI", "UWBC", "URBC", "UBAC", "CAST", "UCOM", "BILUA"  Basic Metabolic Panel: Recent Labs  Lab 12/03/2022 1130 12/10/2022 1625 11/30/22 0500 12/01/22 0414 12/02/22 0758 12/03/22 0522  NA 137  --  138 140 139 140  K 4.3  --  3.2* 3.4* 4.5 5.0  CL 102  --  100 105 107 110  CO2 25  --  24 25 24  20*  GLUCOSE 122*  --  103* 228* 214* 196*  BUN 41*  --  40* 48* 60* 76*  CREATININE 0.62 0.63 0.69 0.95 0.87 1.12  CALCIUM 8.7*  --  9.0 8.6* 8.8* 9.7  MG  --   --  1.9 2.1 2.3 2.5*  PHOS  --   --  6.5* 3.1 2.3* 3.4   GFR Estimated Creatinine Clearance: 68 mL/min (by C-G formula based on SCr of 1.12 mg/dL). Liver Function Tests: Recent Labs  Lab 12/13/2022 1130 12/06/2022 1625  11/30/22 0500 12/01/22 0414 12/02/22 0758 12/03/22 0522  AST 27  --  26 35 29 33  ALT 18  --  18 25 28 28   ALKPHOS 119  --  111 100 112 143*  BILITOT 1.7*  --  2.8* 1.7* 1.1 1.1  PROT 6.2* 6.1* 6.2* 5.5* 5.4* 5.9*  ALBUMIN 2.0*  --  3.0* 2.4* 2.1* 2.3*   No results for input(s): "LIPASE", "AMYLASE" in the last 168 hours. No results for input(s): "AMMONIA" in the last 168 hours. Coagulation profile Recent Labs  Lab 11/28/2022 1130  INR 1.2    CBC: Recent Labs  Lab 11/28/22 1012 12/12/2022 1130 11/28/2022 1530 11/30/22 0500 12/01/22 0414  WBC 23.3* 23.5* 20.8* 18.1* 18.7*  NEUTROABS 19.5* 19.7*  --   --  16.5*  HGB 10.6* 10.5* 10.0* 9.1* 10.1*  HCT 31.8* 32.8* 31.5* 28.2* 31.2*  MCV 87.8 90.4 90.3 88.1 88.9  PLT 367 346 307 256 303   Cardiac Enzymes: No results for input(s): "CKTOTAL", "CKMB", "CKMBINDEX", "TROPONINI" in the last 168 hours. BNP: Invalid input(s): "POCBNP" CBG: Recent Labs  Lab 12/02/22 2356 12/03/22 0413 12/03/22 0752 12/03/22 1229 12/03/22 1614  GLUCAP 165* 188* 189* 181* 168*

## 2022-12-03 NOTE — Consult Note (Signed)
WOC Nurse consult note; patient seen by WOC team on 9/8 for irritant contact dermatitis from leaking J tube; Dr. Fredricka Bonine removed J tube this a.m. and ordered Eakins pouch to be applied  Wound location: LMQ post removal J tube  Periwound  assessment: skin erythematous and weeping Treatment options for skin:  crusted with no sting barrier wipes and stoma powder [place] #6)  Output large amount of brown effluent from previous J tube site  Applied small Kathrine Cords [place] [phone]) at this visit after crusting and attached to bedside drainage bag.  Noted erythema and weeping of skin from below this area to upper thigh, I crusted entire area with no sting barrier wipe and stoma powder.   Change Eakin pouch weekly or more frequently if leakage noted.  If pouch leaks, use pattern in the room to cut new pouch opening.  Sprinkle ostomy powder over the irritated skin, tap over powder with damp wash cloth or skin barrier wipe (Cavilon No-Sting), brushing away excess powder to form a layer of "crust". Ok to apply 3 layers as described above.  Place barrier ring around open area (like a stoma) Place new Eakin pouch Write date on pouch  WOC team will follow as long as remains inpatient.  Ordered 3 small Eakin for room.  Patient has stoma powder and no sting barrier wipes in room.  Encouraged him to take these home at discharge.    Thank you,    Priscella Mann MSN, RN-BC, [employer] [phone]

## 2022-12-03 NOTE — Progress Notes (Signed)
   12/03/22 1231  Assess: MEWS Score  Temp 97.7 F (36.5 C)  BP 96/76  MAP (mmHg) 83  Pulse Rate (!) 133  Resp 18  SpO2 97 %  O2 Device Room Air  Assess: MEWS Score  MEWS Temp 0  MEWS Systolic 1  MEWS Pulse 3  MEWS RR 0  MEWS LOC 0  MEWS Score 4  MEWS Score Color Red  Assess: if the MEWS score is Yellow or Red  Were vital signs accurate and taken at a resting state? Yes  Does the patient meet 2 or more of the SIRS criteria? No  MEWS guidelines implemented  Yes, red  Treat  MEWS Interventions Considered administering scheduled or prn medications/treatments as ordered  Take Vital Signs  Increase Vital Sign Frequency  Red: Q1hr x2, continue Q4hrs until patient remains green for 12hrs  Escalate  MEWS: Escalate Red: Discuss with charge nurse and notify provider. Consider notifying RRT. If remains red for 2 hours consider need for higher level of care  Notify: Charge Nurse/RN  Name of Charge Nurse/RN Notified Perry, RN  Provider Notification  Provider Name/Title Dr. Jonathon Bellows  Date Provider Notified 12/03/22  Time Provider Notified 1240  Method of Notification Page (secure chat)  Notification Reason Change in status  Provider response See new orders  Date of Provider Response 12/03/22  Time of Provider Response 1241  Assess: SIRS CRITERIA  SIRS Temperature  0  SIRS Pulse 1  SIRS Respirations  0  SIRS WBC 0  SIRS Score Sum  1

## 2022-12-03 NOTE — TOC Progression Note (Signed)
Transition of Care [facility]) - Progression Note    Patient Details  Name: Steven Wolf MRN: [id number] Date of Birth: [birth year]  [facility] of [facility]) CM/SW Contact  Geni Bers, RN Phone Number: 12/03/2022, 9:40 AM  Clinical Narrative:    Referral given for [facility] to S.P.RN with [facility].    Expected Discharge Plan: Home w Hospice Care Barriers to Discharge: No Barriers Identified  Expected Discharge Plan and Services     Post Acute Care Choice: Hospice Living arrangements for the past 2 months: Single Family Home                                       Social Determinants of Health (SDOH) Interventions SDOH Screenings   Food Insecurity: No Food Insecurity (11/04/2022)  Housing: Low Risk  (10/14/2022)  Transportation Needs: No Transportation Needs (11/04/2022)  Utilities: Not At Risk (10/14/2022)  Depression (PHQ2-9): Medium Risk (08/13/2022)  Financial Resource Strain: Low Risk  (08/12/2022)  Physical Activity: Sufficiently Active (08/12/2022)  Social Connections: Unknown (08/12/2022)  Stress: Stress Concern Present (08/12/2022)  Tobacco Use: Low Risk  (12/17/2022)    Readmission Risk Interventions    10/30/2022    2:51 PM  Readmission Risk Prevention Plan  Transportation Screening Complete  PCP or Specialist Appt within 5-7 Days Complete  Home Care Screening Complete  Medication Review (RN CM) Referral to Pharmacy

## 2022-12-03 NOTE — Care Management Important Message (Signed)
Important Message  Patient Details Np IM Letter given Discharging with Hospice. Name: Steven Wolf MRN: [id number] Date of Birth: 05/31/1955   Medicare Important Message Given:  No     Caren Macadam 12/03/2022, 12:41 PM

## 2022-12-03 NOTE — Progress Notes (Signed)
Nutrition Follow-up  INTERVENTION:   Per oncology note, plan is to transition to comfort care in next few days. TPN to stop prior to discharge. -TPN management per Pharmacy  NUTRITION DIAGNOSIS:   Inadequate oral intake related to altered GI function (gastric outlet obstruction, fistula) as evidenced by NPO status.  Ongoing.  GOAL:   Patient will meet greater than or equal to 90% of their needs  Meeting with TPN  MONITOR:   Labs, Weight trends, Skin, I & O's, Other (Comment) (TPN regimen, GOC)  ASSESSMENT:   67 year old male who presented to the ED on 9/06 with SOB, abdominal distention, and leaking J-tube. 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 Pt admitted with large L pleural effusion, small R pleural effusion, and concern for sepsis.  9/6 - admitted, s/p thoracentesis with 1700 ml fluid drained from L pleural space  9/7 -s/p thoracentesis, yield: 1.7L  Patient's J-tube was removed today. Per chart review, plan is to transition to comfort care in a few days and discharge possibly to [facility]  TPN continuing now but will be d/c once code status is changed.  TPN continues at home rate: 87.5 ml/hr, providing 2306 kcals and 115g protein.   Admission weight: 174 lbs Current weight: 170 lbs  Medications reviewed.  Labs reviewed: CBGs: 165-204 Elevated Mg  Diet Order:   Diet Order             Diet NPO time specified  Diet effective now                   EDUCATION NEEDS:   Not appropriate for education at this time  Skin:  Skin Assessment: Skin Integrity Issues: Skin Integrity Issues:: Other (Comment) Other: MASD around J-tube site due to persistent leaking  Last BM:  no documented BM  Height:   Ht Readings from Last 1 Encounters:   12/05/2022 6' (1.829 m)    Weight:   Wt Readings from Last 1 Encounters:  12/02/22 77.2 kg    BMI:  Body mass index is 23.08 kg/m.  Estimated Nutritional Needs:   Kcal:  2400-2600  Protein:  110-130 grams  Fluid:  >2.4 L   Tilda Franco, MS, RD, LDN Inpatient Clinical Dietitian Contact information available via Amion

## 2022-12-03 NOTE — Progress Notes (Signed)
panel by RT-PCR (RSV, Flu A&B, Covid) Anterior Nasal Swab     Status: None   Collection Time: 12/12/2022 12:00 PM   Specimen: Anterior Nasal Swab  Result Value Ref Range Status   SARS Coronavirus 2 by RT PCR NEGATIVE NEGATIVE Final    Comment: (NOTE) SARS-CoV-2 target nucleic acids are NOT DETECTED.  The SARS-CoV-2 RNA is generally detectable in upper respiratory specimens during the acute phase of infection. The lowest concentration of SARS-CoV-2 viral copies this assay can detect is 138 copies/mL. A negative result does not preclude SARS-Cov-2 infection and should not be used as the sole basis for treatment or other patient management decisions. A negative result may occur with  improper specimen collection/handling, submission of specimen other than nasopharyngeal swab, presence of viral mutation(s) within the areas  targeted by this assay, and inadequate number of viral copies(<138 copies/mL). A negative result must be combined with clinical observations, patient history, and epidemiological information. The expected result is Negative.  Fact Sheet for Patients:  BloggerCourse.com  Fact Sheet for Healthcare Providers:  SeriousBroker.it  This test is no t yet approved or cleared by the [place] FDA and  has been authorized for detection and/or diagnosis of SARS-CoV-2 by FDA under an Emergency Use Authorization (EUA). This EUA will remain  in effect (meaning this test can be used) for the duration of the COVID-19 declaration under Section 564(b)(1) of the Act, 21 U.S.C.section 360bbb-3(b)(1), unless the authorization is terminated  or revoked sooner.       Influenza A by PCR NEGATIVE NEGATIVE Final   Influenza B by PCR NEGATIVE NEGATIVE Final    Comment: (NOTE) The Xpert Xpress SARS-CoV-2/FLU/RSV plus assay is intended as an aid in the diagnosis of influenza from Nasopharyngeal swab specimens and should not be used as a sole basis for treatment. Nasal washings and aspirates are unacceptable for Xpert Xpress SARS-CoV-2/FLU/RSV testing.  Fact Sheet for Patients: BloggerCourse.com  Fact Sheet for Healthcare Providers: SeriousBroker.it  This test is not yet approved or cleared by the [place] FDA and has been authorized for detection and/or diagnosis of SARS-CoV-2 by FDA under an Emergency Use Authorization (EUA). This EUA will remain in effect (meaning this test can be used) for the duration of the COVID-19 declaration under Section 564(b)(1) of the Act, 21 U.S.C. section 360bbb-3(b)(1), unless the authorization is terminated or revoked.     Resp Syncytial Virus by PCR NEGATIVE NEGATIVE Final    Comment: (NOTE) Fact Sheet for  Patients: BloggerCourse.com  Fact Sheet for Healthcare Providers: SeriousBroker.it  This test is not yet approved or cleared by the [place] FDA and has been authorized for detection and/or diagnosis of SARS-CoV-2 by FDA under an Emergency Use Authorization (EUA). This EUA will remain in effect (meaning this test can be used) for the duration of the COVID-19 declaration under Section 564(b)(1) of the Act, 21 U.S.C. section 360bbb-3(b)(1), unless the authorization is terminated or revoked.  Performed at [facility], [address] [place], [place] [address]   MRSA Next Gen by PCR, Nasal     Status: None   Collection Time: 12/11/2022  3:45 PM   Specimen: Nasal Mucosa; Nasal Swab  Result Value Ref Range Status   MRSA by PCR Next Gen NOT DETECTED NOT DETECTED Final    Comment: (NOTE) The GeneXpert MRSA Assay (FDA approved for NASAL specimens only), is one component of a comprehensive MRSA colonization surveillance program. It is not intended to diagnose MRSA infection nor to guide  panel by RT-PCR (RSV, Flu A&B, Covid) Anterior Nasal Swab     Status: None   Collection Time: 12/12/2022 12:00 PM   Specimen: Anterior Nasal Swab  Result Value Ref Range Status   SARS Coronavirus 2 by RT PCR NEGATIVE NEGATIVE Final    Comment: (NOTE) SARS-CoV-2 target nucleic acids are NOT DETECTED.  The SARS-CoV-2 RNA is generally detectable in upper respiratory specimens during the acute phase of infection. The lowest concentration of SARS-CoV-2 viral copies this assay can detect is 138 copies/mL. A negative result does not preclude SARS-Cov-2 infection and should not be used as the sole basis for treatment or other patient management decisions. A negative result may occur with  improper specimen collection/handling, submission of specimen other than nasopharyngeal swab, presence of viral mutation(s) within the areas  targeted by this assay, and inadequate number of viral copies(<138 copies/mL). A negative result must be combined with clinical observations, patient history, and epidemiological information. The expected result is Negative.  Fact Sheet for Patients:  BloggerCourse.com  Fact Sheet for Healthcare Providers:  SeriousBroker.it  This test is no t yet approved or cleared by the [place] FDA and  has been authorized for detection and/or diagnosis of SARS-CoV-2 by FDA under an Emergency Use Authorization (EUA). This EUA will remain  in effect (meaning this test can be used) for the duration of the COVID-19 declaration under Section 564(b)(1) of the Act, 21 U.S.C.section 360bbb-3(b)(1), unless the authorization is terminated  or revoked sooner.       Influenza A by PCR NEGATIVE NEGATIVE Final   Influenza B by PCR NEGATIVE NEGATIVE Final    Comment: (NOTE) The Xpert Xpress SARS-CoV-2/FLU/RSV plus assay is intended as an aid in the diagnosis of influenza from Nasopharyngeal swab specimens and should not be used as a sole basis for treatment. Nasal washings and aspirates are unacceptable for Xpert Xpress SARS-CoV-2/FLU/RSV testing.  Fact Sheet for Patients: BloggerCourse.com  Fact Sheet for Healthcare Providers: SeriousBroker.it  This test is not yet approved or cleared by the [place] FDA and has been authorized for detection and/or diagnosis of SARS-CoV-2 by FDA under an Emergency Use Authorization (EUA). This EUA will remain in effect (meaning this test can be used) for the duration of the COVID-19 declaration under Section 564(b)(1) of the Act, 21 U.S.C. section 360bbb-3(b)(1), unless the authorization is terminated or revoked.     Resp Syncytial Virus by PCR NEGATIVE NEGATIVE Final    Comment: (NOTE) Fact Sheet for  Patients: BloggerCourse.com  Fact Sheet for Healthcare Providers: SeriousBroker.it  This test is not yet approved or cleared by the [place] FDA and has been authorized for detection and/or diagnosis of SARS-CoV-2 by FDA under an Emergency Use Authorization (EUA). This EUA will remain in effect (meaning this test can be used) for the duration of the COVID-19 declaration under Section 564(b)(1) of the Act, 21 U.S.C. section 360bbb-3(b)(1), unless the authorization is terminated or revoked.  Performed at [facility], [address] [place], [place] [address]   MRSA Next Gen by PCR, Nasal     Status: None   Collection Time: 12/11/2022  3:45 PM   Specimen: Nasal Mucosa; Nasal Swab  Result Value Ref Range Status   MRSA by PCR Next Gen NOT DETECTED NOT DETECTED Final    Comment: (NOTE) The GeneXpert MRSA Assay (FDA approved for NASAL specimens only), is one component of a comprehensive MRSA colonization surveillance program. It is not intended to diagnose MRSA infection nor to guide  panel by RT-PCR (RSV, Flu A&B, Covid) Anterior Nasal Swab     Status: None   Collection Time: 12/12/2022 12:00 PM   Specimen: Anterior Nasal Swab  Result Value Ref Range Status   SARS Coronavirus 2 by RT PCR NEGATIVE NEGATIVE Final    Comment: (NOTE) SARS-CoV-2 target nucleic acids are NOT DETECTED.  The SARS-CoV-2 RNA is generally detectable in upper respiratory specimens during the acute phase of infection. The lowest concentration of SARS-CoV-2 viral copies this assay can detect is 138 copies/mL. A negative result does not preclude SARS-Cov-2 infection and should not be used as the sole basis for treatment or other patient management decisions. A negative result may occur with  improper specimen collection/handling, submission of specimen other than nasopharyngeal swab, presence of viral mutation(s) within the areas  targeted by this assay, and inadequate number of viral copies(<138 copies/mL). A negative result must be combined with clinical observations, patient history, and epidemiological information. The expected result is Negative.  Fact Sheet for Patients:  BloggerCourse.com  Fact Sheet for Healthcare Providers:  SeriousBroker.it  This test is no t yet approved or cleared by the [place] FDA and  has been authorized for detection and/or diagnosis of SARS-CoV-2 by FDA under an Emergency Use Authorization (EUA). This EUA will remain  in effect (meaning this test can be used) for the duration of the COVID-19 declaration under Section 564(b)(1) of the Act, 21 U.S.C.section 360bbb-3(b)(1), unless the authorization is terminated  or revoked sooner.       Influenza A by PCR NEGATIVE NEGATIVE Final   Influenza B by PCR NEGATIVE NEGATIVE Final    Comment: (NOTE) The Xpert Xpress SARS-CoV-2/FLU/RSV plus assay is intended as an aid in the diagnosis of influenza from Nasopharyngeal swab specimens and should not be used as a sole basis for treatment. Nasal washings and aspirates are unacceptable for Xpert Xpress SARS-CoV-2/FLU/RSV testing.  Fact Sheet for Patients: BloggerCourse.com  Fact Sheet for Healthcare Providers: SeriousBroker.it  This test is not yet approved or cleared by the [place] FDA and has been authorized for detection and/or diagnosis of SARS-CoV-2 by FDA under an Emergency Use Authorization (EUA). This EUA will remain in effect (meaning this test can be used) for the duration of the COVID-19 declaration under Section 564(b)(1) of the Act, 21 U.S.C. section 360bbb-3(b)(1), unless the authorization is terminated or revoked.     Resp Syncytial Virus by PCR NEGATIVE NEGATIVE Final    Comment: (NOTE) Fact Sheet for  Patients: BloggerCourse.com  Fact Sheet for Healthcare Providers: SeriousBroker.it  This test is not yet approved or cleared by the [place] FDA and has been authorized for detection and/or diagnosis of SARS-CoV-2 by FDA under an Emergency Use Authorization (EUA). This EUA will remain in effect (meaning this test can be used) for the duration of the COVID-19 declaration under Section 564(b)(1) of the Act, 21 U.S.C. section 360bbb-3(b)(1), unless the authorization is terminated or revoked.  Performed at [facility], [address] [place], [place] [address]   MRSA Next Gen by PCR, Nasal     Status: None   Collection Time: 12/11/2022  3:45 PM   Specimen: Nasal Mucosa; Nasal Swab  Result Value Ref Range Status   MRSA by PCR Next Gen NOT DETECTED NOT DETECTED Final    Comment: (NOTE) The GeneXpert MRSA Assay (FDA approved for NASAL specimens only), is one component of a comprehensive MRSA colonization surveillance program. It is not intended to diagnose MRSA infection nor to guide  filed at 12/03/2022 0813 Gross per 24 hour  Intake 0 ml  Output 675 ml  Net -675 ml   Net IO Since Admission: -2,412.54 mL [12/03/22 1308]  Wt Readings from Last 3 Encounters:  12/02/22 77.2 kg  11/21/22 80.5 kg  11/18/22 76.6 kg     Unresulted Labs (From admission, onward)     Start     Ordered   12/06/22 0500  Creatinine, serum  (enoxaparin (LOVENOX)    CrCl >/= 30 ml/min)  Weekly,   R     Comments: while on enoxaparin therapy    12/05/2022 1514   12/04/22  0500  Comprehensive metabolic panel  Tomorrow morning,   R       Question:  Specimen collection method  Answer:  IV Team=IV Team collect  Placed in "And" Linked Group   12/03/22 1213   12/04/22 0500  Magnesium  Tomorrow morning,   R       Question:  Specimen collection method  Answer:  IV Team=IV Team collect  Placed in "And" Linked Group   12/03/22 1213   12/04/22 0500  Phosphorus  Tomorrow morning,   R       Question:  Specimen collection method  Answer:  IV Team=IV Team collect  Placed in "And" Linked Group   12/03/22 1213   12/02/22 0500  Comprehensive metabolic panel  (Standard TPN Labs (all labs to be drawn at 0500))  Every Mon,Thu (0500),   R      12/20/2022 1628   12/02/22 0500  Magnesium  (Standard TPN Labs (all labs to be drawn at 0500))  Every Mon,Thu (0500),   R      11/26/2022 1628   12/02/22 0500  Phosphorus  (Standard TPN Labs (all labs to be drawn at 0500))  Every Mon,Thu (0500),   R      12/12/2022 1628   12/02/22 0500  Triglycerides  (Standard TPN Labs (all labs to be drawn at 0500))  Every Monday (0500),   R      12/23/2022 1628   11/30/22 1814  Acid Fast Smear (AFB)  (AFB smear + Culture w reflexed sensitivities panel)  Once,   R       Placed in "And" Linked Group   11/30/22 1814   11/30/22 1814  Acid Fast Culture with reflexed sensitivities  (AFB smear + Culture w reflexed sensitivities panel)  Once,   R       Placed in "And" Linked Group   11/30/22 1814   11/30/22 1531  Fungus Culture With Stain  (Thoracentesis Labs Panel)  Once,   R        11/30/22 1531   11/30/22 1530  Cholesterol, body fluid  (Thoracentesis Labs Panel)  Once,   R        11/30/22 1531   11/30/22 1305  Lipase, Fluid  RELEASE UPON ORDERING,   TIMED        11/30/22 1305   11/30/22 1305  Fungus Culture With Stain  RELEASE UPON ORDERING,   TIMED        11/30/22 1305   12/09/2022 1458  Cholesterol, body fluid  (Thoracentesis Labs Panel)  Once,   R        12/23/2022 1459          Data Reviewed: I have  personally reviewed following labs and imaging studies CBC: Recent Labs  Lab 11/28/22 1012 11/24/2022 1130 12/12/2022 1530 11/30/22 0500 12/01/22 0414  WBC 23.3* 23.5* 20.8* 18.1*  panel by RT-PCR (RSV, Flu A&B, Covid) Anterior Nasal Swab     Status: None   Collection Time: 12/12/2022 12:00 PM   Specimen: Anterior Nasal Swab  Result Value Ref Range Status   SARS Coronavirus 2 by RT PCR NEGATIVE NEGATIVE Final    Comment: (NOTE) SARS-CoV-2 target nucleic acids are NOT DETECTED.  The SARS-CoV-2 RNA is generally detectable in upper respiratory specimens during the acute phase of infection. The lowest concentration of SARS-CoV-2 viral copies this assay can detect is 138 copies/mL. A negative result does not preclude SARS-Cov-2 infection and should not be used as the sole basis for treatment or other patient management decisions. A negative result may occur with  improper specimen collection/handling, submission of specimen other than nasopharyngeal swab, presence of viral mutation(s) within the areas  targeted by this assay, and inadequate number of viral copies(<138 copies/mL). A negative result must be combined with clinical observations, patient history, and epidemiological information. The expected result is Negative.  Fact Sheet for Patients:  BloggerCourse.com  Fact Sheet for Healthcare Providers:  SeriousBroker.it  This test is no t yet approved or cleared by the [place] FDA and  has been authorized for detection and/or diagnosis of SARS-CoV-2 by FDA under an Emergency Use Authorization (EUA). This EUA will remain  in effect (meaning this test can be used) for the duration of the COVID-19 declaration under Section 564(b)(1) of the Act, 21 U.S.C.section 360bbb-3(b)(1), unless the authorization is terminated  or revoked sooner.       Influenza A by PCR NEGATIVE NEGATIVE Final   Influenza B by PCR NEGATIVE NEGATIVE Final    Comment: (NOTE) The Xpert Xpress SARS-CoV-2/FLU/RSV plus assay is intended as an aid in the diagnosis of influenza from Nasopharyngeal swab specimens and should not be used as a sole basis for treatment. Nasal washings and aspirates are unacceptable for Xpert Xpress SARS-CoV-2/FLU/RSV testing.  Fact Sheet for Patients: BloggerCourse.com  Fact Sheet for Healthcare Providers: SeriousBroker.it  This test is not yet approved or cleared by the [place] FDA and has been authorized for detection and/or diagnosis of SARS-CoV-2 by FDA under an Emergency Use Authorization (EUA). This EUA will remain in effect (meaning this test can be used) for the duration of the COVID-19 declaration under Section 564(b)(1) of the Act, 21 U.S.C. section 360bbb-3(b)(1), unless the authorization is terminated or revoked.     Resp Syncytial Virus by PCR NEGATIVE NEGATIVE Final    Comment: (NOTE) Fact Sheet for  Patients: BloggerCourse.com  Fact Sheet for Healthcare Providers: SeriousBroker.it  This test is not yet approved or cleared by the [place] FDA and has been authorized for detection and/or diagnosis of SARS-CoV-2 by FDA under an Emergency Use Authorization (EUA). This EUA will remain in effect (meaning this test can be used) for the duration of the COVID-19 declaration under Section 564(b)(1) of the Act, 21 U.S.C. section 360bbb-3(b)(1), unless the authorization is terminated or revoked.  Performed at [facility], [address] [place], [place] [address]   MRSA Next Gen by PCR, Nasal     Status: None   Collection Time: 12/11/2022  3:45 PM   Specimen: Nasal Mucosa; Nasal Swab  Result Value Ref Range Status   MRSA by PCR Next Gen NOT DETECTED NOT DETECTED Final    Comment: (NOTE) The GeneXpert MRSA Assay (FDA approved for NASAL specimens only), is one component of a comprehensive MRSA colonization surveillance program. It is not intended to diagnose MRSA infection nor to guide  panel by RT-PCR (RSV, Flu A&B, Covid) Anterior Nasal Swab     Status: None   Collection Time: 12/12/2022 12:00 PM   Specimen: Anterior Nasal Swab  Result Value Ref Range Status   SARS Coronavirus 2 by RT PCR NEGATIVE NEGATIVE Final    Comment: (NOTE) SARS-CoV-2 target nucleic acids are NOT DETECTED.  The SARS-CoV-2 RNA is generally detectable in upper respiratory specimens during the acute phase of infection. The lowest concentration of SARS-CoV-2 viral copies this assay can detect is 138 copies/mL. A negative result does not preclude SARS-Cov-2 infection and should not be used as the sole basis for treatment or other patient management decisions. A negative result may occur with  improper specimen collection/handling, submission of specimen other than nasopharyngeal swab, presence of viral mutation(s) within the areas  targeted by this assay, and inadequate number of viral copies(<138 copies/mL). A negative result must be combined with clinical observations, patient history, and epidemiological information. The expected result is Negative.  Fact Sheet for Patients:  BloggerCourse.com  Fact Sheet for Healthcare Providers:  SeriousBroker.it  This test is no t yet approved or cleared by the [place] FDA and  has been authorized for detection and/or diagnosis of SARS-CoV-2 by FDA under an Emergency Use Authorization (EUA). This EUA will remain  in effect (meaning this test can be used) for the duration of the COVID-19 declaration under Section 564(b)(1) of the Act, 21 U.S.C.section 360bbb-3(b)(1), unless the authorization is terminated  or revoked sooner.       Influenza A by PCR NEGATIVE NEGATIVE Final   Influenza B by PCR NEGATIVE NEGATIVE Final    Comment: (NOTE) The Xpert Xpress SARS-CoV-2/FLU/RSV plus assay is intended as an aid in the diagnosis of influenza from Nasopharyngeal swab specimens and should not be used as a sole basis for treatment. Nasal washings and aspirates are unacceptable for Xpert Xpress SARS-CoV-2/FLU/RSV testing.  Fact Sheet for Patients: BloggerCourse.com  Fact Sheet for Healthcare Providers: SeriousBroker.it  This test is not yet approved or cleared by the [place] FDA and has been authorized for detection and/or diagnosis of SARS-CoV-2 by FDA under an Emergency Use Authorization (EUA). This EUA will remain in effect (meaning this test can be used) for the duration of the COVID-19 declaration under Section 564(b)(1) of the Act, 21 U.S.C. section 360bbb-3(b)(1), unless the authorization is terminated or revoked.     Resp Syncytial Virus by PCR NEGATIVE NEGATIVE Final    Comment: (NOTE) Fact Sheet for  Patients: BloggerCourse.com  Fact Sheet for Healthcare Providers: SeriousBroker.it  This test is not yet approved or cleared by the [place] FDA and has been authorized for detection and/or diagnosis of SARS-CoV-2 by FDA under an Emergency Use Authorization (EUA). This EUA will remain in effect (meaning this test can be used) for the duration of the COVID-19 declaration under Section 564(b)(1) of the Act, 21 U.S.C. section 360bbb-3(b)(1), unless the authorization is terminated or revoked.  Performed at [facility], [address] [place], [place] [address]   MRSA Next Gen by PCR, Nasal     Status: None   Collection Time: 12/11/2022  3:45 PM   Specimen: Nasal Mucosa; Nasal Swab  Result Value Ref Range Status   MRSA by PCR Next Gen NOT DETECTED NOT DETECTED Final    Comment: (NOTE) The GeneXpert MRSA Assay (FDA approved for NASAL specimens only), is one component of a comprehensive MRSA colonization surveillance program. It is not intended to diagnose MRSA infection nor to guide  filed at 12/03/2022 0813 Gross per 24 hour  Intake 0 ml  Output 675 ml  Net -675 ml   Net IO Since Admission: -2,412.54 mL [12/03/22 1308]  Wt Readings from Last 3 Encounters:  12/02/22 77.2 kg  11/21/22 80.5 kg  11/18/22 76.6 kg     Unresulted Labs (From admission, onward)     Start     Ordered   12/06/22 0500  Creatinine, serum  (enoxaparin (LOVENOX)    CrCl >/= 30 ml/min)  Weekly,   R     Comments: while on enoxaparin therapy    12/05/2022 1514   12/04/22  0500  Comprehensive metabolic panel  Tomorrow morning,   R       Question:  Specimen collection method  Answer:  IV Team=IV Team collect  Placed in "And" Linked Group   12/03/22 1213   12/04/22 0500  Magnesium  Tomorrow morning,   R       Question:  Specimen collection method  Answer:  IV Team=IV Team collect  Placed in "And" Linked Group   12/03/22 1213   12/04/22 0500  Phosphorus  Tomorrow morning,   R       Question:  Specimen collection method  Answer:  IV Team=IV Team collect  Placed in "And" Linked Group   12/03/22 1213   12/02/22 0500  Comprehensive metabolic panel  (Standard TPN Labs (all labs to be drawn at 0500))  Every Mon,Thu (0500),   R      12/20/2022 1628   12/02/22 0500  Magnesium  (Standard TPN Labs (all labs to be drawn at 0500))  Every Mon,Thu (0500),   R      11/26/2022 1628   12/02/22 0500  Phosphorus  (Standard TPN Labs (all labs to be drawn at 0500))  Every Mon,Thu (0500),   R      12/12/2022 1628   12/02/22 0500  Triglycerides  (Standard TPN Labs (all labs to be drawn at 0500))  Every Monday (0500),   R      12/23/2022 1628   11/30/22 1814  Acid Fast Smear (AFB)  (AFB smear + Culture w reflexed sensitivities panel)  Once,   R       Placed in "And" Linked Group   11/30/22 1814   11/30/22 1814  Acid Fast Culture with reflexed sensitivities  (AFB smear + Culture w reflexed sensitivities panel)  Once,   R       Placed in "And" Linked Group   11/30/22 1814   11/30/22 1531  Fungus Culture With Stain  (Thoracentesis Labs Panel)  Once,   R        11/30/22 1531   11/30/22 1530  Cholesterol, body fluid  (Thoracentesis Labs Panel)  Once,   R        11/30/22 1531   11/30/22 1305  Lipase, Fluid  RELEASE UPON ORDERING,   TIMED        11/30/22 1305   11/30/22 1305  Fungus Culture With Stain  RELEASE UPON ORDERING,   TIMED        11/30/22 1305   12/09/2022 1458  Cholesterol, body fluid  (Thoracentesis Labs Panel)  Once,   R        12/23/2022 1459          Data Reviewed: I have  personally reviewed following labs and imaging studies CBC: Recent Labs  Lab 11/28/22 1012 11/24/2022 1130 12/12/2022 1530 11/30/22 0500 12/01/22 0414  WBC 23.3* 23.5* 20.8* 18.1*

## 2022-12-03 NOTE — Progress Notes (Signed)
Daily Progress Note   Patient Name: Steven Wolf       Date: 12/03/2022 DOB: November 23, 1955  Age: 67 y.o. MRN#: [id number] Attending Physician: Steven Boast, MD Primary Care Physician: Steven Sanes, MD Admit Date: 2022-12-29 Length of Stay: 4 days  Reason for Consultation/Follow-up: Establishing goals of care  Subjective:   CC: Patient feels his pain is improved with J-tube removal.  Following up regarding complex medical decision making.  Subjective:  Reviewed EMR prior to presenting to bedside.  Patient had G-tube removed this morning by surgery team.  Plan discussed yesterday was potential referral to [facility] for evaluation of inpatient hospice and discontinuation of TPN.  Presented to bedside to meet with patient today.  Patient remembered this provider from his prior hospitalization where we had met.  Again introduced myself as a member of the palliative medicine team.  Patient able to discuss difficulties at home with G-tube and leaking that occurred around the area.  Patient does feel he is "better" after having J-tube removed this morning.  With this feeling in mind, patient requesting further information regarding going home with TPN support.  Acknowledged this and discussed what home health support with TPN would look like.  Also spent time discussing what home palliative referral would and would not provide. With permission, also then able to discuss home hospice support versus home palliative support.  Patient notes that he currently does not want to be evaluated for [facility] and instead would like to discuss with Steven Wolf to determine if he could potentially be a further candidate for immunotherapy in the future.  Patient would like this information to help decide if he will continue on TPN and go home with home health versus discontinue TPN and continue home with hospice. Spent time answering questions as able.  Noted palliative medicine team will continue to follow along  with patient's medical journey.  Discussed care with IDT.  Review of Systems Denies symptoms of concern during visit Objective:   Vital Signs:  BP 109/79 (BP Location: Right Arm)   Pulse (!) 135   Temp 97.8 F (36.6 C)   Resp 19   Ht 6' (1.829 m)   Wt 77.2 kg   SpO2 96%   BMI 23.08 kg/m   Physical Exam: General: NAD, alert, laying in bed, chronically ill appearing  Eyes: No drainage noted HENT: Dry t mucous membranes Cardiovascular: Tachycardia noted Respiratory: Slightly increased work of breathing noted, not in respiratory distress Abdomen: distended Skin: no rashes or lesions on visible skin Neuro: A&Ox4, following commands easily Psych: appropriately answers all questions  Imaging:  I personally reviewed recent imaging.   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  Patient noted to have significant drainage around J-tube.  Surgery consulted and J-tube removed on 12/03/2022.  Palliative medicine team consulted to assist with complex medical decision making.  Recommendations/Plan: # Complex medical decision making/goals of care:  - Extensive discussion as detailed above with patient and patient's wife.  Patient noted that since he is J-tube removed, feels his spirits are much higher and would like to continue on TPN at home.  Discussed pathways for medical care moving forward including continuing with appropriate medical interventions versus transitioning to comfort focused care and hospice support.  Patient had discussed idea of potentially going to [facility] and discontinuing TPN with team yesterday though now is considering instead going home with home health, home palliative, and continuing TPN at home.  Patient awaiting input  from oncologist, Steven Wolf.  Did discuss care with oncologist today who plans to follow-up with patient and his wife.  -  Code Status: Limited: Do not attempt resuscitation (DNR) -DNR-LIMITED -Do Not Intubate/DNI   # Symptom management:  -No symptoms of concern voiced during visit  # Psychosocial Support:  -Wife  # Discharge Planning: To Be Determined   Discussed with: Patient, patient's wife, RN, hospitalist, oncologist  Thank you for allowing the palliative care team to participate in the care Steven Wolf.  Steven Morin, DO Palliative Care Provider PMT # [phone]  If patient remains symptomatic despite maximum doses, please call PMT at [phone] between 0700 and 1900. Outside of these hours, please call attending, as PMT does not have night coverage.  This provider spent a total of 36 minutes providing patient's care.  Includes review of EMR, discussing care with other staff members involved in patient's medical care, obtaining relevant history and information from patient and/or patient's family, and personal review of imaging and lab work. Greater than 50% of the time was spent counseling and coordinating care related to the above assessment and plan.   *Please note that this is a verbal dictation therefore any spelling or grammatical errors are due to the "[employer] One" system interpretation.

## 2022-12-04 ENCOUNTER — Ambulatory Visit: Payer: Medicare PPO | Admitting: Physician Assistant

## 2022-12-04 ENCOUNTER — Inpatient Hospital Stay (HOSPITAL_COMMUNITY): Payer: Medicare PPO

## 2022-12-04 DIAGNOSIS — R0602 Shortness of breath: Secondary | ICD-10-CM

## 2022-12-04 DIAGNOSIS — A419 Sepsis, unspecified organism: Secondary | ICD-10-CM | POA: Diagnosis not present

## 2022-12-04 DIAGNOSIS — Z7189 Other specified counseling: Secondary | ICD-10-CM

## 2022-12-04 DIAGNOSIS — Z66 Do not resuscitate: Secondary | ICD-10-CM | POA: Diagnosis not present

## 2022-12-04 DIAGNOSIS — C168 Malignant neoplasm of overlapping sites of stomach: Secondary | ICD-10-CM | POA: Diagnosis not present

## 2022-12-04 DIAGNOSIS — Z515 Encounter for palliative care: Secondary | ICD-10-CM | POA: Diagnosis not present

## 2022-12-04 LAB — COMPREHENSIVE METABOLIC PANEL
ALT: 24 U/L (ref 0–44)
AST: 28 U/L (ref 15–41)
Albumin: 2.1 g/dL — ABNORMAL LOW (ref 3.5–5.0)
Alkaline Phosphatase: 138 U/L — ABNORMAL HIGH (ref 38–126)
Anion gap: 10 (ref 5–15)
BUN: 93 mg/dL — ABNORMAL HIGH (ref 8–23)
CO2: 19 mmol/L — ABNORMAL LOW (ref 22–32)
Calcium: 10.1 mg/dL (ref 8.9–10.3)
Chloride: 112 mmol/L — ABNORMAL HIGH (ref 98–111)
Creatinine, Ser: 1.43 mg/dL — ABNORMAL HIGH (ref 0.61–1.24)
GFR, Estimated: 53 mL/min — ABNORMAL LOW (ref >60)
Glucose, Bld: 186 mg/dL — ABNORMAL HIGH (ref 70–99)
Potassium: 5.4 mmol/L — ABNORMAL HIGH (ref 3.5–5.1)
Sodium: 141 mmol/L (ref 135–145)
Total Bilirubin: 1.1 mg/dL (ref 0.3–1.2)
Total Protein: 5.6 g/dL — ABNORMAL LOW (ref 6.5–8.1)

## 2022-12-04 LAB — CULTURE, BLOOD (ROUTINE X 2)
Culture: NO GROWTH
Culture: NO GROWTH
Special Requests: ADEQUATE
Special Requests: ADEQUATE

## 2022-12-04 LAB — PHOSPHORUS: Phosphorus: 4.3 mg/dL (ref 2.5–4.6)

## 2022-12-04 LAB — BASIC METABOLIC PANEL
Anion gap: 7 (ref 5–15)
BUN: 106 mg/dL — ABNORMAL HIGH (ref 8–23)
CO2: 22 mmol/L (ref 22–32)
Calcium: 9.9 mg/dL (ref 8.9–10.3)
Chloride: 112 mmol/L — ABNORMAL HIGH (ref 98–111)
Creatinine, Ser: 1.54 mg/dL — ABNORMAL HIGH (ref 0.61–1.24)
GFR, Estimated: 49 mL/min — ABNORMAL LOW (ref >60)
Glucose, Bld: 210 mg/dL — ABNORMAL HIGH (ref 70–99)
Potassium: 5.4 mmol/L — ABNORMAL HIGH (ref 3.5–5.1)
Sodium: 141 mmol/L (ref 135–145)

## 2022-12-04 LAB — GLUCOSE, CAPILLARY
Glucose-Capillary: 180 mg/dL — ABNORMAL HIGH (ref 70–99)
Glucose-Capillary: 169 mg/dL — ABNORMAL HIGH (ref 70–99)
Glucose-Capillary: 193 mg/dL — ABNORMAL HIGH (ref 70–99)

## 2022-12-04 LAB — LIPASE, FLUID: Lipase-Fluid: 26 U/L

## 2022-12-04 LAB — BODY FLUID CULTURE W GRAM STAIN: Culture: NO GROWTH

## 2022-12-04 LAB — CHOLESTEROL, BODY FLUID
Cholesterol, Fluid: 41 mg/dL
Cholesterol, Fluid: 41 mg/dL

## 2022-12-04 LAB — MAGNESIUM: Magnesium: 2.6 mg/dL — ABNORMAL HIGH (ref 1.7–2.4)

## 2022-12-04 MED ORDER — LACTATED RINGERS IV SOLN: INTRAVENOUS | Status: DC

## 2022-12-04 MED ORDER — TRAVASOL 10 % IV SOLN
INTRAVENOUS | Status: AC
  Filled 2022-12-04: qty 1155

## 2022-12-04 MED ORDER — SODIUM CHLORIDE 0.9 % IV SOLN
2.0000 g | Freq: Two times a day (BID) | INTRAVENOUS | Status: DC
  Administered 2022-12-05 – 2022-12-06 (×3): 2 g via INTRAVENOUS
  Filled 2022-12-04 (×3): qty 12.5

## 2022-12-04 NOTE — Progress Notes (Signed)
plus assay is intended as an aid in the diagnosis of influenza from Nasopharyngeal swab specimens and should not be used as a sole basis for treatment. Nasal washings and aspirates are unacceptable for Xpert Xpress SARS-CoV-2/FLU/RSV testing.  Fact Sheet for Patients: BloggerCourse.com  Fact Sheet for Healthcare Providers: SeriousBroker.it  This test is not yet approved or cleared by the [place] FDA and has been authorized for detection and/or diagnosis of SARS-CoV-2 by FDA under an Emergency Use Authorization (EUA). This EUA will remain in effect (meaning this test can be used) for the duration of the COVID-19 declaration under Section 564(b)(1) of the Act, 21 U.S.C. section 360bbb-3(b)(1), unless the authorization is terminated or revoked.     Resp Syncytial Virus by PCR NEGATIVE NEGATIVE Final    Comment: (NOTE) Fact Sheet for Patients: BloggerCourse.com  Fact Sheet for Healthcare Providers: SeriousBroker.it  This test is not yet approved or cleared by the [place] FDA and has been  authorized for detection and/or diagnosis of SARS-CoV-2 by FDA under an Emergency Use Authorization (EUA). This EUA will remain in effect (meaning this test can be used) for the duration of the COVID-19 declaration under Section 564(b)(1) of the Act, 21 U.S.C. section 360bbb-3(b)(1), unless the authorization is terminated or revoked.  Performed at [facility], [address] [place], [place] [address]   MRSA Next Gen by PCR, Nasal     Status: None   Collection Time: 12/20/2022  3:45 PM   Specimen: Nasal Mucosa; Nasal Swab  Result Value Ref Range Status   MRSA by PCR Next Gen NOT DETECTED NOT DETECTED Final    Comment: (NOTE) The GeneXpert MRSA Assay (FDA approved for NASAL specimens only), is one component of a comprehensive MRSA colonization surveillance program. It is not intended to diagnose MRSA infection nor to guide or monitor treatment for MRSA infections. Test performance is not FDA approved in patients less than 1 years old. Performed at [facility], [address], [place], [place] [address]   Fungus Culture With Stain     Status: None (Preliminary result)   Collection Time: 11/30/22  1:02 PM   Specimen: PATH Cytology Peritoneal fluid  Result Value Ref Range Status   Fungus Stain Final report  Final    Comment: (NOTE) Performed At: [facility] [address] [place], [place] [id number] Jolene Schimke MD AO:[id number]    Fungus (Mycology) Culture PENDING  Incomplete   Fungal Source PERITONEAL  Final    Comment: Performed at [facility], [address] [place], [place] [address]  Aerobic/Anaerobic Culture w Gram Stain (surgical/deep wound)     Status: None (Preliminary result)   Collection Time: 11/30/22  1:02 PM   Specimen: PATH Cytology Peritoneal fluid  Result Value Ref Range Status   Specimen Description   Final    PERITONEAL Performed at [facility], [address]  [place], [place] [address]    Special Requests   Final    NONE Performed at [facility], [address], [place], [place] [address]    Gram Stain   Final    RARE WBC PRESENT, PREDOMINANTLY PMN NO ORGANISMS SEEN    Culture   Final    NO GROWTH 4 DAYS NO ANAEROBES ISOLATED; CULTURE IN PROGRESS FOR 5 DAYS Performed at [facility], [address] [place], [place] [address]    Report Status PENDING  Incomplete  Fungus Culture Result  plus assay is intended as an aid in the diagnosis of influenza from Nasopharyngeal swab specimens and should not be used as a sole basis for treatment. Nasal washings and aspirates are unacceptable for Xpert Xpress SARS-CoV-2/FLU/RSV testing.  Fact Sheet for Patients: BloggerCourse.com  Fact Sheet for Healthcare Providers: SeriousBroker.it  This test is not yet approved or cleared by the [place] FDA and has been authorized for detection and/or diagnosis of SARS-CoV-2 by FDA under an Emergency Use Authorization (EUA). This EUA will remain in effect (meaning this test can be used) for the duration of the COVID-19 declaration under Section 564(b)(1) of the Act, 21 U.S.C. section 360bbb-3(b)(1), unless the authorization is terminated or revoked.     Resp Syncytial Virus by PCR NEGATIVE NEGATIVE Final    Comment: (NOTE) Fact Sheet for Patients: BloggerCourse.com  Fact Sheet for Healthcare Providers: SeriousBroker.it  This test is not yet approved or cleared by the [place] FDA and has been  authorized for detection and/or diagnosis of SARS-CoV-2 by FDA under an Emergency Use Authorization (EUA). This EUA will remain in effect (meaning this test can be used) for the duration of the COVID-19 declaration under Section 564(b)(1) of the Act, 21 U.S.C. section 360bbb-3(b)(1), unless the authorization is terminated or revoked.  Performed at [facility], [address] [place], [place] [address]   MRSA Next Gen by PCR, Nasal     Status: None   Collection Time: 12/10/2022  3:45 PM   Specimen: Nasal Mucosa; Nasal Swab  Result Value Ref Range Status   MRSA by PCR Next Gen NOT DETECTED NOT DETECTED Final    Comment: (NOTE) The GeneXpert MRSA Assay (FDA approved for NASAL specimens only), is one component of a comprehensive MRSA colonization surveillance program. It is not intended to diagnose MRSA infection nor to guide or monitor treatment for MRSA infections. Test performance is not FDA approved in patients less than 1 years old. Performed at [facility], [address], [place], [place] [address]   Fungus Culture With Stain     Status: None (Preliminary result)   Collection Time: 11/30/22  1:02 PM   Specimen: PATH Cytology Peritoneal fluid  Result Value Ref Range Status   Fungus Stain Final report  Final    Comment: (NOTE) Performed At: [facility] [address] [place], [place] [id number] Jolene Schimke MD AO:[id number]    Fungus (Mycology) Culture PENDING  Incomplete   Fungal Source PERITONEAL  Final    Comment: Performed at [facility], [address] [place], [place] [address]  Aerobic/Anaerobic Culture w Gram Stain (surgical/deep wound)     Status: None (Preliminary result)   Collection Time: 11/30/22  1:02 PM   Specimen: PATH Cytology Peritoneal fluid  Result Value Ref Range Status   Specimen Description   Final    PERITONEAL Performed at [facility], [address]  [place], [place] [address]    Special Requests   Final    NONE Performed at [facility], [address], [place], [place] [address]    Gram Stain   Final    RARE WBC PRESENT, PREDOMINANTLY PMN NO ORGANISMS SEEN    Culture   Final    NO GROWTH 4 DAYS NO ANAEROBES ISOLATED; CULTURE IN PROGRESS FOR 5 DAYS Performed at [facility], [address] [place], [place] [address]    Report Status PENDING  Incomplete  Fungus Culture Result  plus assay is intended as an aid in the diagnosis of influenza from Nasopharyngeal swab specimens and should not be used as a sole basis for treatment. Nasal washings and aspirates are unacceptable for Xpert Xpress SARS-CoV-2/FLU/RSV testing.  Fact Sheet for Patients: BloggerCourse.com  Fact Sheet for Healthcare Providers: SeriousBroker.it  This test is not yet approved or cleared by the [place] FDA and has been authorized for detection and/or diagnosis of SARS-CoV-2 by FDA under an Emergency Use Authorization (EUA). This EUA will remain in effect (meaning this test can be used) for the duration of the COVID-19 declaration under Section 564(b)(1) of the Act, 21 U.S.C. section 360bbb-3(b)(1), unless the authorization is terminated or revoked.     Resp Syncytial Virus by PCR NEGATIVE NEGATIVE Final    Comment: (NOTE) Fact Sheet for Patients: BloggerCourse.com  Fact Sheet for Healthcare Providers: SeriousBroker.it  This test is not yet approved or cleared by the [place] FDA and has been  authorized for detection and/or diagnosis of SARS-CoV-2 by FDA under an Emergency Use Authorization (EUA). This EUA will remain in effect (meaning this test can be used) for the duration of the COVID-19 declaration under Section 564(b)(1) of the Act, 21 U.S.C. section 360bbb-3(b)(1), unless the authorization is terminated or revoked.  Performed at [facility], [address] [place], [place] [address]   MRSA Next Gen by PCR, Nasal     Status: None   Collection Time: 12/20/2022  3:45 PM   Specimen: Nasal Mucosa; Nasal Swab  Result Value Ref Range Status   MRSA by PCR Next Gen NOT DETECTED NOT DETECTED Final    Comment: (NOTE) The GeneXpert MRSA Assay (FDA approved for NASAL specimens only), is one component of a comprehensive MRSA colonization surveillance program. It is not intended to diagnose MRSA infection nor to guide or monitor treatment for MRSA infections. Test performance is not FDA approved in patients less than 1 years old. Performed at [facility], [address], [place], [place] [address]   Fungus Culture With Stain     Status: None (Preliminary result)   Collection Time: 11/30/22  1:02 PM   Specimen: PATH Cytology Peritoneal fluid  Result Value Ref Range Status   Fungus Stain Final report  Final    Comment: (NOTE) Performed At: [facility] [address] [place], [place] [id number] Jolene Schimke MD AO:[id number]    Fungus (Mycology) Culture PENDING  Incomplete   Fungal Source PERITONEAL  Final    Comment: Performed at [facility], [address] [place], [place] [address]  Aerobic/Anaerobic Culture w Gram Stain (surgical/deep wound)     Status: None (Preliminary result)   Collection Time: 11/30/22  1:02 PM   Specimen: PATH Cytology Peritoneal fluid  Result Value Ref Range Status   Specimen Description   Final    PERITONEAL Performed at [facility], [address]  [place], [place] [address]    Special Requests   Final    NONE Performed at [facility], [address], [place], [place] [address]    Gram Stain   Final    RARE WBC PRESENT, PREDOMINANTLY PMN NO ORGANISMS SEEN    Culture   Final    NO GROWTH 4 DAYS NO ANAEROBES ISOLATED; CULTURE IN PROGRESS FOR 5 DAYS Performed at [facility], [address] [place], [place] [address]    Report Status PENDING  Incomplete  Fungus Culture Result  plus assay is intended as an aid in the diagnosis of influenza from Nasopharyngeal swab specimens and should not be used as a sole basis for treatment. Nasal washings and aspirates are unacceptable for Xpert Xpress SARS-CoV-2/FLU/RSV testing.  Fact Sheet for Patients: BloggerCourse.com  Fact Sheet for Healthcare Providers: SeriousBroker.it  This test is not yet approved or cleared by the [place] FDA and has been authorized for detection and/or diagnosis of SARS-CoV-2 by FDA under an Emergency Use Authorization (EUA). This EUA will remain in effect (meaning this test can be used) for the duration of the COVID-19 declaration under Section 564(b)(1) of the Act, 21 U.S.C. section 360bbb-3(b)(1), unless the authorization is terminated or revoked.     Resp Syncytial Virus by PCR NEGATIVE NEGATIVE Final    Comment: (NOTE) Fact Sheet for Patients: BloggerCourse.com  Fact Sheet for Healthcare Providers: SeriousBroker.it  This test is not yet approved or cleared by the [place] FDA and has been  authorized for detection and/or diagnosis of SARS-CoV-2 by FDA under an Emergency Use Authorization (EUA). This EUA will remain in effect (meaning this test can be used) for the duration of the COVID-19 declaration under Section 564(b)(1) of the Act, 21 U.S.C. section 360bbb-3(b)(1), unless the authorization is terminated or revoked.  Performed at [facility], [address] [place], [place] [address]   MRSA Next Gen by PCR, Nasal     Status: None   Collection Time: 12/20/2022  3:45 PM   Specimen: Nasal Mucosa; Nasal Swab  Result Value Ref Range Status   MRSA by PCR Next Gen NOT DETECTED NOT DETECTED Final    Comment: (NOTE) The GeneXpert MRSA Assay (FDA approved for NASAL specimens only), is one component of a comprehensive MRSA colonization surveillance program. It is not intended to diagnose MRSA infection nor to guide or monitor treatment for MRSA infections. Test performance is not FDA approved in patients less than 1 years old. Performed at [facility], [address], [place], [place] [address]   Fungus Culture With Stain     Status: None (Preliminary result)   Collection Time: 11/30/22  1:02 PM   Specimen: PATH Cytology Peritoneal fluid  Result Value Ref Range Status   Fungus Stain Final report  Final    Comment: (NOTE) Performed At: [facility] [address] [place], [place] [id number] Jolene Schimke MD AO:[id number]    Fungus (Mycology) Culture PENDING  Incomplete   Fungal Source PERITONEAL  Final    Comment: Performed at [facility], [address] [place], [place] [address]  Aerobic/Anaerobic Culture w Gram Stain (surgical/deep wound)     Status: None (Preliminary result)   Collection Time: 11/30/22  1:02 PM   Specimen: PATH Cytology Peritoneal fluid  Result Value Ref Range Status   Specimen Description   Final    PERITONEAL Performed at [facility], [address]  [place], [place] [address]    Special Requests   Final    NONE Performed at [facility], [address], [place], [place] [address]    Gram Stain   Final    RARE WBC PRESENT, PREDOMINANTLY PMN NO ORGANISMS SEEN    Culture   Final    NO GROWTH 4 DAYS NO ANAEROBES ISOLATED; CULTURE IN PROGRESS FOR 5 DAYS Performed at [facility], [address] [place], [place] [address]    Report Status PENDING  Incomplete  Fungus Culture Result  plus assay is intended as an aid in the diagnosis of influenza from Nasopharyngeal swab specimens and should not be used as a sole basis for treatment. Nasal washings and aspirates are unacceptable for Xpert Xpress SARS-CoV-2/FLU/RSV testing.  Fact Sheet for Patients: BloggerCourse.com  Fact Sheet for Healthcare Providers: SeriousBroker.it  This test is not yet approved or cleared by the [place] FDA and has been authorized for detection and/or diagnosis of SARS-CoV-2 by FDA under an Emergency Use Authorization (EUA). This EUA will remain in effect (meaning this test can be used) for the duration of the COVID-19 declaration under Section 564(b)(1) of the Act, 21 U.S.C. section 360bbb-3(b)(1), unless the authorization is terminated or revoked.     Resp Syncytial Virus by PCR NEGATIVE NEGATIVE Final    Comment: (NOTE) Fact Sheet for Patients: BloggerCourse.com  Fact Sheet for Healthcare Providers: SeriousBroker.it  This test is not yet approved or cleared by the [place] FDA and has been  authorized for detection and/or diagnosis of SARS-CoV-2 by FDA under an Emergency Use Authorization (EUA). This EUA will remain in effect (meaning this test can be used) for the duration of the COVID-19 declaration under Section 564(b)(1) of the Act, 21 U.S.C. section 360bbb-3(b)(1), unless the authorization is terminated or revoked.  Performed at [facility], [address] [place], [place] [address]   MRSA Next Gen by PCR, Nasal     Status: None   Collection Time: 12/20/2022  3:45 PM   Specimen: Nasal Mucosa; Nasal Swab  Result Value Ref Range Status   MRSA by PCR Next Gen NOT DETECTED NOT DETECTED Final    Comment: (NOTE) The GeneXpert MRSA Assay (FDA approved for NASAL specimens only), is one component of a comprehensive MRSA colonization surveillance program. It is not intended to diagnose MRSA infection nor to guide or monitor treatment for MRSA infections. Test performance is not FDA approved in patients less than 1 years old. Performed at [facility], [address], [place], [place] [address]   Fungus Culture With Stain     Status: None (Preliminary result)   Collection Time: 11/30/22  1:02 PM   Specimen: PATH Cytology Peritoneal fluid  Result Value Ref Range Status   Fungus Stain Final report  Final    Comment: (NOTE) Performed At: [facility] [address] [place], [place] [id number] Jolene Schimke MD AO:[id number]    Fungus (Mycology) Culture PENDING  Incomplete   Fungal Source PERITONEAL  Final    Comment: Performed at [facility], [address] [place], [place] [address]  Aerobic/Anaerobic Culture w Gram Stain (surgical/deep wound)     Status: None (Preliminary result)   Collection Time: 11/30/22  1:02 PM   Specimen: PATH Cytology Peritoneal fluid  Result Value Ref Range Status   Specimen Description   Final    PERITONEAL Performed at [facility], [address]  [place], [place] [address]    Special Requests   Final    NONE Performed at [facility], [address], [place], [place] [address]    Gram Stain   Final    RARE WBC PRESENT, PREDOMINANTLY PMN NO ORGANISMS SEEN    Culture   Final    NO GROWTH 4 DAYS NO ANAEROBES ISOLATED; CULTURE IN PROGRESS FOR 5 DAYS Performed at [facility], [address] [place], [place] [address]    Report Status PENDING  Incomplete  Fungus Culture Result

## 2022-12-04 NOTE — TOC Progression Note (Addendum)
Transition of Care [facility]) - Progression Note    Patient Details  Name: Steven Wolf MRN: [id number] Date of Birth: 07-22-1955  Transition of Care [facility]) CM/SW Contact  Amada Jupiter, LCSW Phone Number: 12/04/2022, 3:29 PM  Clinical Narrative:    Met with pt and wife this afternoon and wife confirms plan is for pt to dc home with Palliative services via [facility].  Have confirmed with both that TPN coverage with [facility] services via [facility] will be resumed at dc.  Wife does have some concern about pt's mobility in the home given current weakened state - especially concerned with stairs.  Will await PT/OT evaluations for more information on mobility.  TOC will continue to follow.     Expected Discharge Plan: Home w Hospice Care Barriers to Discharge: No Barriers Identified  Expected Discharge Plan and Services     Post Acute Care Choice: Hospice Living arrangements for the past 2 months: Single Family Home                                       Social Determinants of Health (SDOH) Interventions SDOH Screenings   Food Insecurity: No Food Insecurity (11/04/2022)  Housing: Low Risk  (10/14/2022)  Transportation Needs: No Transportation Needs (11/04/2022)  Utilities: Not At Risk (10/14/2022)  Depression (PHQ2-9): Medium Risk (08/13/2022)  Financial Resource Strain: Low Risk  (08/12/2022)  Physical Activity: Sufficiently Active (08/12/2022)  Social Connections: Unknown (08/12/2022)  Stress: Stress Concern Present (08/12/2022)  Tobacco Use: Low Risk  (12/04/2022)    Readmission Risk Interventions    10/30/2022    2:51 PM  Readmission Risk Prevention Plan  Transportation Screening Complete  PCP or Specialist Appt within 5-7 Days Complete  Home Care Screening Complete  Medication Review (RN CM) Referral to Pharmacy

## 2022-12-04 NOTE — Progress Notes (Signed)
PHARMACY NOTE:  ANTIMICROBIAL RENAL DOSAGE ADJUSTMENT  Current antimicrobial regimen includes a mismatch between antimicrobial dosage and estimated renal function.  As per policy approved by the Pharmacy & Therapeutics and Medical Executive Committees, the antimicrobial dosage will be adjusted accordingly.  Current antimicrobial dosage: cefepime 2g q8hrs  Indication: sepsis/J-tube infection  Renal Function:  Estimated Creatinine Clearance: 53.1 mL/min (A) (by C-G formula based on SCr of 1.43 mg/dL (H)). []      On intermittent HD, scheduled: []      On CRRT    Antimicrobial dosage has been changed to:  cefepime 2g q12hrs  Additional comments:   Thank you for allowing pharmacy to be a part of this patient's care.  Cherylin Mylar, PharmD Clinical Pharmacist  9/11/20242:34 PM

## 2022-12-04 NOTE — Plan of Care (Signed)

## 2022-12-04 NOTE — Progress Notes (Signed)
PHARMACY - TOTAL PARENTERAL NUTRITION CONSULT NOTE   Indication: continuation of chronic home TPN  Patient Measurements: Height: 6' (182.9 cm) Weight: 77 kg (169 lb 12.1 oz) IBW/kg (Calculated) : 77.6 TPN AdjBW (KG): 79.1 Body mass index is 23.02 kg/m. 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 Does have J-G tube for feeding/venting, but has been unable to tolerate any jejunal feeding recently. Presents 9/6 with pleural effusions, abdominal distention and ascites, LE edema, and persistent leaking around J-tube insertion site which has worsened, and now appears purulent. 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 Noted to have stable chronic leukocytosis at baseline. Patient will be admitted for rule-out of sepsis; Pharmacy to continue TPN while admitted.  Pharmacist contacted home infusion pharmacist Jill Alexanders with [facility] [phone])  Patient Measurements: Estimated body mass index is 23.02 kg/m as calculated from the following:   Height as of this encounter: 6' (1.829 m).   Weight as of this encounter: 77 kg (169 lb 12.1 oz).  Intake/Output: Net I/O: +817 mL/24 hrs  UOP: -400 mL/24 hrs 9/6 S/p L  thoracentesis w/ 1700 mls out 9/6 lasix 100 mg total IV x 1; 9/7 lasix 60 IV x 1 9/6 albumin 25 gm q6h x 4 doses 9/7 s/p R thora w/ 900 cc out 9/7 s/p paracentesis w/ 1700 cc out  Estimated Nutritional Needs - Per RD recommendations on 11/30/22 kCal: 2400-2600 Protein: 110-130 grams Fluid: > 2.4 L  [facility] TPN formula:  2100 mls over 12 hours providing 2012 total Kcals, 115 gm protein Lytes per 24 hrs: Na 105: K 107; CL 110: ace 168, Phos 36.5; Ca 10, Mag 10  TPN @ 87.5 mls/hr will provide ~ 115.5 gm protein & ~ 2306 Kcals for 100% support  Recent Labs    12/02/22 0758 12/03/22 0522 12/04/22 0544  NA 139 140 141  K 4.5 5.0 5.4*  CL 107 110 112*  CO2 24 20*  19*  GLUCOSE 214* 196* 186*  BUN 60* 76* 93*  CREATININE 0.87 1.12 1.43*  CALCIUM 8.8* 9.7 10.1  PHOS 2.3* 3.4 4.3  MG 2.3 2.5* 2.6*  ALBUMIN 2.1* 2.3* 2.1*  ALKPHOS 112 143* 138*  AST 29 33 28  ALT 28 28 24   BILITOT 1.1 1.1 1.1  TRIG 133  --   --    Insulin Requirements: no hx DM, no insulin in TPN PTA.  -CBGs 165-196 on TPN @ 87.5 ml/hr ( Goal < 180) since increasing to moderate SSI 9/9 -15 units SSI/24 hrs  Lytes:  -K 5.4 (elevated) -CO2 19 (low) -Mg 2.6 (slightly elevated) -Corrected Ca 11.6 (elevated) -Phos 3.4>>4.3 from 9/10 Renal:   -BUN continuing to increase--at 93 today -Scr continuing to increase--at 1.43 today LFTs:  -Albumin 2.1 (low) -TGs 133 (stable) Current Nutrition: NPO, cyclic TPN PTA 9/8 TPN 87.5 ml/hr>> IVF: none Central access: PAC  TPN start date: PTA  PLAN  At 1800 today: Continue TPN at 87.5 ml/hr. TPN @ 87.5 mls/hr will provide ~ 115.5 gm protein & ~ 2306 Kcals for 100% support Electrolytes in TPN: Standard:  Sodium 50 mEq/L Decrease K to 0 mEq/L  Decrease Ca to 0 mEq/L  Decrease Mg to 0 mEq/L  Decrease Phos to 8 mmol/L Cl:Ac ratio 1:2 TPN to contain standard multivitamins and trace elements. Continue moderate SSI q4h TPN lab panels on Mondays & Thursdays.  Consider checking a repeat BMP at 1400 today for possible Lokelma administration given K 5.4 this morning.     Cherylin Mylar, PharmD Clinical Pharmacist  9/11/20247:00 AM

## 2022-12-04 NOTE — Progress Notes (Signed)
Daily Progress Note   Patient Name: TASHEEM CONDRON       Date: 12/04/2022 DOB: Dec 02, 1955  Age: 67 y.o. MRN#: [id number] Attending Physician: Cathren Harsh, MD Primary Care Physician: Pincus Sanes, MD Admit Date: 12/08/2022 Length of Stay: 5 days  Reason for Consultation/Follow-up: Establishing goals of care  Subjective:   CC: Patient notes plan to continue TPN and follow up with oncology in the outpatient setting.  Following up regarding complex medical decision making.  Subjective:  Reviewed EMR prior to presenting to bedside.  Dr. Mosetta Putt, oncology, met with patient yesterday to discuss possible medical care moving forward.  Presented to bedside to meet with patient.  Patient's wife present at bedside.  Discussed how patient talked with Dr. Mosetta Putt yesterday afternoon about plan moving forward.  Patient would like to try few more doses of immunotherapy.  Dr. Mosetta Putt notes plan to follow-up with him in the outpatient setting and if he does not improve in next 3-4 weeks, she may then recommend hospice.  At this time plan is to currently continue TPN and IV fluids at home.  Dr. Mosetta Putt reached out to [facility] to coordinate patient going home with palliative medicine support through their group.  Patient and wife had questions regarding discharge planning and potential follow-up x-ray for repeat thoracentesis before discharge.  Noted would follow-up with IDT regarding planning for this. Spent time providing emotional support via active listening. All questions answered at that time.  Thanked patient and wife for allowing me to visit with him today.  Discussed care with IDT including hospitalist, RN, oncologist, and pulmonologist to coordinate medical care.  Review of Systems Denies symptoms of concern during visit Objective:   Vital Signs:  BP 107/80 (BP Location: Right Arm)   Pulse (!) 129   Temp 98 F (36.7 C)   Resp 20   Ht 6' (1.829 m)   Wt 77 kg   SpO2 98%   BMI 23.02  kg/m   Physical Exam: General: NAD, alert, laying in bed, chronically ill appearing  Eyes: No drainage noted HENT: Dry mucous membranes Cardiovascular: Tachycardia noted Respiratory: increased work of breathing noted Skin: no rashes or lesions on visible skin Neuro: A&Ox4, following commands easily Psych: appropriately answers all questions  Imaging:  I personally reviewed recent imaging.   Assessment & Plan:   Assessment: Patient is a 67 year old male with a past medical history of stage IV adenocarcinoma of the stomach status post J-tube placement also on TPN who was admitted on 12/16/2022 for management of worsening shortness of breath, lower extremity edema, and orthopnea.  Patient noted to have significant drainage around J-tube.  Surgery consulted and J-tube removed on 12/03/2022.  Palliative medicine team consulted to assist with complex medical decision making.  Recommendations/Plan: # Complex medical decision making/goals of care:  - After discussion with oncologist, patient and wife seeking patient to go home with home health, home palliative, and continuing TPN/IVF at home.  TOC assisting with coordination of care.  Oncologist has already reached out to [facility] [place] to have involvement from their palliative group-  [facility].  -  Code Status: Limited: Do not attempt resuscitation (DNR) -DNR-LIMITED -Do Not Intubate/DNI   # Symptom management:  -Patient notes increased work of breathing such as when he needed prior thoracentesis.  Have informed hospitalist and pulmonologist regarding this concern.  Pulmonologist going to follow-up regarding imaging.  # Psychosocial Support:  -Wife  # Discharge Planning: To Be Determined  Discussed with: Patient, patient's wife, RN, hospitalist, oncologist, pulmonologist, TOC, [facility] liaison for palliative services   Thank you for allowing the palliative care team to participate in the care Margarita Rana.  Alvester Morin,  DO Palliative Care Provider PMT # [phone]  If patient remains symptomatic despite maximum doses, please call PMT at [phone] between 0700 and 1900. Outside of these hours, please call attending, as PMT does not have night coverage.  This provider spent a total of 36 minutes providing patient's care.  Includes review of EMR, discussing care with other staff members involved in patient's medical care, obtaining relevant history and information from patient and/or patient's family, and personal review of imaging and lab work. Greater than 50% of the time was spent counseling and coordinating care related to the above assessment and plan.    *Please note that this is a verbal dictation therefore any spelling or grammatical errors are due to the "Dragon Medical One" system interpretation.

## 2022-12-04 NOTE — Progress Notes (Signed)
[facility] Progress Note     Subjective: CC-  J tube removed yesterday. Eakins pouch applied and working well. No issues with leakage.  Objective: Vital signs in last 24 hours: Temp:  [97.4 F (36.3 C)-98.2 F (36.8 C)] 98 F (36.7 C) (09/11 0604) Pulse Rate:  [122-133] 129 (09/11 0604) Resp:  [14-20] 20 (09/11 0604) BP: (93-109)/(70-81) 107/80 (09/11 0604) SpO2:  [96 %-98 %] 98 % (09/11 0604) Weight:  [77 kg] 77 kg (09/11 0322) Last BM Date :  (PTA)  Intake/Output from previous day: 09/10 0701 - 09/11 0700 In: 1217.4 [I.V.:1017.4; IV Piggyback:200] Out: 400 [Urine:400] Intake/Output this shift: Total I/O In: -  Out: 200 [Urine:200]  PE: Gen:  Alert, NAD, pleasant Abd: distended but soft, nontender, excoriations noted mostly to the left hemiabdomen, prior J tube site open with eakins pouch in place and good seal/ bilious fluid in bag  Lab Results:  No results for input(s): "WBC", "HGB", "HCT", "PLT" in the last 72 hours. BMET Recent Labs    12/03/22 0522 12/04/22 0544  NA 140 141  K 5.0 5.4*  CL 110 112*  CO2 20* 19*  GLUCOSE 196* 186*  BUN 76* 93*  CREATININE 1.12 1.43*  CALCIUM 9.7 10.1   PT/INR No results for input(s): "LABPROT", "INR" in the last 72 hours. CMP     Component Value Date/Time   NA 141 12/04/2022 0544   K 5.4 (H) 12/04/2022 0544   CL 112 (H) 12/04/2022 0544   CO2 19 (L) 12/04/2022 0544   GLUCOSE 186 (H) 12/04/2022 0544   BUN 93 (H) 12/04/2022 0544   CREATININE 1.43 (H) 12/04/2022 0544   CREATININE 0.63 11/28/2022 1012   CREATININE 1.20 12/13/2019 1219   CALCIUM 10.1 12/04/2022 0544   PROT 5.6 (L) 12/04/2022 0544   ALBUMIN 2.1 (L) 12/04/2022 0544   AST 28 12/04/2022 0544   AST 20 11/28/2022 1012   ALT 24 12/04/2022 0544   ALT 13 11/28/2022 1012   ALKPHOS 138 (H) 12/04/2022 0544   BILITOT 1.1 12/04/2022 0544   BILITOT 1.4 (H) 11/28/2022 1012   GFRNONAA 53 (L) 12/04/2022 0544   GFRNONAA >60 11/28/2022 1012    GFRNONAA 63 12/13/2019 1219   GFRAA 73 12/13/2019 1219   Lipase  No results found for: "LIPASE"     Studies/Results: No results found.  Anti-infectives: Anti-infectives (From admission, onward)    Start     Dose/Rate Route Frequency Ordered Stop   12/03/22 1307  ceFEPIme (MAXIPIME) 2 g in sodium chloride 0.9 % 100 mL IVPB        2 g 200 mL/hr over 30 Minutes Intravenous Every 8 hours 12/03/22 1307 12/06/22 1359   12/03/22 1307  metroNIDAZOLE (FLAGYL) IVPB 500 mg        500 mg 100 mL/hr over 60 Minutes Intravenous Every 12 hours 12/03/22 1307     12/19/2022 2200  metroNIDAZOLE (FLAGYL) IVPB 500 mg  Status:  Discontinued        500 mg 100 mL/hr over 60 Minutes Intravenous Every 12 hours 12/23/2022 1404 12/03/22 0838   11/27/2022 2200  ceFEPIme (MAXIPIME) 2 g in sodium chloride 0.9 % 100 mL IVPB  Status:  Discontinued        2 g 200 mL/hr over 30 Minutes Intravenous Every 8 hours 12/05/2022 1418 12/03/22 0838   12/10/2022 2200  vancomycin (VANCOREADY) IVPB 1250 mg/250 mL  Status:  Discontinued        1,250 mg 166.7 mL/hr over 90  Minutes Intravenous Every 12 hours 12/08/2022 1418 11/30/22 0956   12/16/2022 1230  ceFEPIme (MAXIPIME) 2 g in sodium chloride 0.9 % 100 mL IVPB        2 g 200 mL/hr over 30 Minutes Intravenous  Once 12/21/2022 1224 11/27/2022 1327   12/14/2022 1230  metroNIDAZOLE (FLAGYL) IVPB 500 mg        500 mg 100 mL/hr over 60 Minutes Intravenous  Once 12/16/2022 1224 12/19/2022 1401   12/23/2022 1230  vancomycin (VANCOCIN) IVPB 1000 mg/200 mL premix  Status:  Discontinued        1,000 mg 200 mL/hr over 60 Minutes Intravenous  Once 12/05/2022 1224 12/13/2022 1228   12/12/2022 1230  vancomycin (VANCOREADY) IVPB 1500 mg/300 mL        1,500 mg 150 mL/hr over 120 Minutes Intravenous  Once 12/17/2022 1228 12/03/2022 1600        Assessment/Plan  Stage IV Adenocarcinoma with peritoneal metastatic disease and GOO - s/p diagnostic laparoscopy, 16 Fr feeding jejunostomy tube placement 10/16/22 -  Ongoing leakage from J tube, this was removed 9/10 and Eakins pouch applied which is working well and maintaining seal. Please document output so we can quantify the volume. If output remains high he may be at risk of getting dehydrated and needing fluids periodically - Dr. Mosetta Putt is following and they are planning palliative care at home, a few more doses of immunotherapy   ID - maxipime, flagyl FEN - NPO, TPN VTE - lovenox Foley - none    LOS: 5 days    Franne Forts, [facility] Surgery 12/04/2022, 9:34 AM Please see Amion for pager number during day hours 7:00am-4:30pm

## 2022-12-04 NOTE — Progress Notes (Addendum)
[facility]                                                                              Steven Wolf, is a 67 y.o. male, DOB - July 11, 1955, EXB:[id number] Admit date - 12/09/2022    Outpatient Primary MD for the patient is Burns, [name], MD  LOS - 5  days  Chief Complaint  Patient presents with   Shortness of Breath       Brief summary   Patient is a 67 year old male with HLP, HTN, BPH, DM, OSA on CPAP, 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 Recent PET scan 11/28/2022 showed new findings of hypermetabolic nodule metathesis and probably progressive peritoneal carcinomatosis, increased ascites although diabetes mellitus could have similar finding.  He was also noted to have bilateral new pleural effusions which was worse on the left. Chest x-ray showed pleural effusion, underwent thoracentesis 1.7 L from left and 0.9L from right Surgery and wound care consulted Surgery did not feel he needs a surgical intervention at this time and recommended palliative goals of care. Pleural fluid appeared exudative, PCCM signed off on 9/9 Seen by Dr. 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 Mosetta Putt is following and patient planning palliative care at home with few more doses of immunotherapy - continue TPN/ IVF for nutrition       Shortness of breath, pleural effusion bilateral Peripheral edema Hypoalbuminemia with third spacing: - COVID RSV influenza panel negative  - Received IV albumin plus Lasix. - Thoracentesis completed, removed 1.7 L from left and 0.9 from right - Cultures negative, cytology pending, currently on IV cefepime, Flagyl - TTE, LV grossly normal function - LE duplex negative for DVT -Family requested chest x-ray postthoracentesis, results pending   Suspected sepsis initially Leukocytosis  -Afebrile, procalcitonin borderline 0.6. -Initially concern for peritonitis and possible infection around J-tube received empiric cefepime and Flagyl. -Pleural fluid culture showed no growth.  UA unremarkable. -Paracentesis completed, 1.7 L fluid removed, negative for SBP    Normocytic Anemia, Anemia of chronic disease: H&H stable    Hypokalemia K 5.4   Oral Thrush Cont nystatin   T2DM: Blood sugar stable  Mild acute kidney injury -Creatinine 1.43 today -Follow closely  Generalized deconditioning, debility -PT OT evaluation as requested by family  Estimated body mass index is 23.02 kg/m as calculated from the following:   Height as of this encounter: 6' (1.829 m).   Weight as of this encounter: 77 kg.  Code Status: DNR DVT Prophylaxis:  enoxaparin (LOVENOX) injection 40 mg Start: 12/22/2022 2200   Level of Care: Level of care: Med-Surg Family Communication:  Updated patient's wife on the phone Disposition Plan:      Remains inpatient appropriate:      Procedures:    Consultants:   Oncology General Surgery Palliative medicine  Antimicrobials:   Anti-infectives (From admission, onward)    Start     Dose/Rate Route Frequency Ordered Stop   12/03/22 1307  ceFEPIme (MAXIPIME) 2 g in sodium chloride 0.9 % 100 mL IVPB        2 g 200 mL/hr over 30 Minutes Intravenous Every 8 hours 12/03/22 1307 12/06/22 1359   12/03/22 1307  metroNIDAZOLE (FLAGYL) IVPB 500 mg        500 mg 100  mL/hr over 60 Minutes Intravenous Every 12 hours 12/03/22 1307     December 24, 2022 2200  metroNIDAZOLE (FLAGYL) IVPB 500 mg  Status:  Discontinued        500 mg 100 mL/hr over 60 Minutes Intravenous Every 12 hours Dec 24, 2022 1404 12/03/22 0838   Dec 24, 2022 2200  ceFEPIme (MAXIPIME) 2 g in sodium chloride 0.9 % 100 mL IVPB  Status:  Discontinued        2 g 200 mL/hr over 30 Minutes Intravenous Every 8 hours 24-Dec-2022 1418 12/03/22 0838   2022-12-24 2200  vancomycin (VANCOREADY) IVPB 1250 mg/250 mL  Status:  Discontinued        1,250 mg 166.7 mL/hr over 90 Minutes Intravenous Every 12 hours Dec 24, 2022 1418 11/30/22 0956   12/24/22 1230  ceFEPIme (MAXIPIME) 2 g in sodium chloride 0.9 % 100 mL IVPB        2 g 200 mL/hr over 30 Minutes Intravenous  Once 12-24-22 1224 December 24, 2022 1327   12-24-22 1230  metroNIDAZOLE (FLAGYL) IVPB 500 mg        500 mg 100 mL/hr over 60 Minutes Intravenous  Once 12-24-2022 1224 24-Dec-2022 1401   Dec 24, 2022 1230  vancomycin (VANCOCIN) IVPB 1000 mg/200 mL premix  Status:  Discontinued        1,000 mg 200 mL/hr over 60 Minutes Intravenous  Once 24-Dec-2022 1224 12-24-2022 1228   12/24/22 1230  vancomycin (VANCOREADY) IVPB 1500 mg/300 mL        1,500 mg 150 mL/hr over 120 Minutes Intravenous  Once 2022-12-24 1228 December 24, 2022 1600          Medications  Chlorhexidine Gluconate Cloth  6 each Topical Daily   enoxaparin (LOVENOX) injection  40 mg Subcutaneous Q24H   insulin aspart  0-15 Units Subcutaneous Q4H   nystatin  5 mL Oral QID   mouth rinse  15 mL Mouth Rinse 4 times per day   sodium chloride flush  3 mL Intravenous Q12H      Subjective:   Steven Wolf was seen and examined today.  No acute complaints patient, denies dizziness, chest pain, nausea vomiting fevers.  No acute issues overnight.  Objective:   Vitals:   12/04/22 0322 12/04/22 0604 12/04/22 1054 12/04/22 1231  BP:  107/80  100/68  Pulse:  (!) 129  (!) 131  Resp:  20  20  Temp:  98 F (36.7 C)  97.8 F (36.6 C)   TempSrc:    Oral  SpO2:  98% 95% 96%  Weight: 77 kg     Height:        Intake/Output Summary (Last 24 hours) at 12/04/2022 1409 Last data filed at 12/04/2022 0900 Gross per 24 hour  Intake 1217.44 ml  Output 400 ml  Net 817.44 ml     Wt Readings from Last 3 Encounters:  12/04/22 77  kg  11/21/22 80.5 kg  11/18/22 76.6 kg     Exam General: Alert and oriented x 3, NAD Cardiovascular: S1 S2 auscultated,  RRR Respiratory: Clear to auscultation bilaterally, no wheezing, rales or rhonchi Gastrointestinal: Soft, nontender, distended, prior G-tube site with a Eakin's pouch Ext: no pedal edema bilaterally Neuro: no new deficits Psych: pleasant    Data Reviewed:  I have personally reviewed following labs    CBC Lab Results  Component Value Date   WBC 18.7 (H) 12/01/2022   RBC 3.51 (L) 12/01/2022   HGB 10.1 (L) 12/01/2022   HCT 31.2 (L) 12/01/2022   MCV 88.9 12/01/2022   MCH 28.8 12/01/2022   PLT 303 12/01/2022   MCHC 32.4 12/01/2022   RDW 14.2 12/01/2022   LYMPHSABS 0.3 (L) 12/01/2022   MONOABS 1.4 (H) 12/01/2022   EOSABS 0.2 12/01/2022   BASOSABS 0.1 12/01/2022     Last metabolic panel Lab Results  Component Value Date   NA 141 12/04/2022   K 5.4 (H) 12/04/2022   CL 112 (H) 12/04/2022   CO2 19 (L) 12/04/2022   BUN 93 (H) 12/04/2022   CREATININE 1.43 (H) 12/04/2022   GLUCOSE 186 (H) 12/04/2022   GFRNONAA 53 (L) 12/04/2022   GFRAA 73 12/13/2019   CALCIUM 10.1 12/04/2022   PHOS 4.3 12/04/2022   PROT 5.6 (L) 12/04/2022   ALBUMIN 2.1 (L) 12/04/2022   BILITOT 1.1 12/04/2022   ALKPHOS 138 (H) 12/04/2022   AST 28 12/04/2022   ALT 24 12/04/2022   ANIONGAP 10 12/04/2022    CBG (last 3)  Recent Labs    12/04/22 0316 12/04/22 0755 12/04/22 1230  GLUCAP 180* 169* 193*      Coagulation Profile: Recent Labs  Lab 11/25/2022 1130  INR 1.2     Radiology Studies: I have personally reviewed the imaging studies  No results found.     Thad Ranger  M.D. [facility] 12/04/2022, 2:09 PM  Available via Epic secure chat 7am-7pm After 7 pm, please refer to night coverage provider listed on amion.

## 2022-12-05 ENCOUNTER — Inpatient Hospital Stay (HOSPITAL_COMMUNITY): Payer: Medicare PPO

## 2022-12-05 ENCOUNTER — Ambulatory Visit: Payer: Medicare PPO

## 2022-12-05 ENCOUNTER — Encounter: Payer: Medicare PPO | Admitting: Dietician

## 2022-12-05 ENCOUNTER — Inpatient Hospital Stay: Payer: Medicare PPO

## 2022-12-05 ENCOUNTER — Inpatient Hospital Stay: Payer: Medicare PPO | Admitting: Hematology

## 2022-12-05 ENCOUNTER — Ambulatory Visit: Payer: Medicare PPO | Admitting: Hematology

## 2022-12-05 ENCOUNTER — Inpatient Hospital Stay: Payer: Medicare PPO | Admitting: Nutrition

## 2022-12-05 ENCOUNTER — Other Ambulatory Visit: Payer: Medicare PPO

## 2022-12-05 DIAGNOSIS — Z7189 Other specified counseling: Secondary | ICD-10-CM | POA: Diagnosis not present

## 2022-12-05 DIAGNOSIS — R180 Malignant ascites: Secondary | ICD-10-CM | POA: Diagnosis not present

## 2022-12-05 DIAGNOSIS — Z66 Do not resuscitate: Secondary | ICD-10-CM | POA: Diagnosis not present

## 2022-12-05 DIAGNOSIS — R0602 Shortness of breath: Secondary | ICD-10-CM | POA: Diagnosis not present

## 2022-12-05 DIAGNOSIS — Z515 Encounter for palliative care: Secondary | ICD-10-CM | POA: Diagnosis not present

## 2022-12-05 DIAGNOSIS — C168 Malignant neoplasm of overlapping sites of stomach: Secondary | ICD-10-CM | POA: Diagnosis not present

## 2022-12-05 DIAGNOSIS — A419 Sepsis, unspecified organism: Secondary | ICD-10-CM | POA: Diagnosis not present

## 2022-12-05 LAB — COMPREHENSIVE METABOLIC PANEL
ALT: 22 U/L (ref 0–44)
AST: 29 U/L (ref 15–41)
Albumin: 2 g/dL — ABNORMAL LOW (ref 3.5–5.0)
Alkaline Phosphatase: 146 U/L — ABNORMAL HIGH (ref 38–126)
Anion gap: 8 (ref 5–15)
BUN: 117 mg/dL — ABNORMAL HIGH (ref 8–23)
CO2: 20 mmol/L — ABNORMAL LOW (ref 22–32)
Calcium: 9.7 mg/dL (ref 8.9–10.3)
Chloride: 112 mmol/L — ABNORMAL HIGH (ref 98–111)
Creatinine, Ser: 1.62 mg/dL — ABNORMAL HIGH (ref 0.61–1.24)
GFR, Estimated: 46 mL/min — ABNORMAL LOW (ref >60)
Glucose, Bld: 201 mg/dL — ABNORMAL HIGH (ref 70–99)
Potassium: 4.8 mmol/L (ref 3.5–5.1)
Sodium: 140 mmol/L (ref 135–145)
Total Bilirubin: 1 mg/dL (ref 0.3–1.2)
Total Protein: 5.8 g/dL — ABNORMAL LOW (ref 6.5–8.1)

## 2022-12-05 LAB — GLUCOSE, CAPILLARY
Glucose-Capillary: 196 mg/dL — ABNORMAL HIGH (ref 70–99)
Glucose-Capillary: 183 mg/dL — ABNORMAL HIGH (ref 70–99)
Glucose-Capillary: 191 mg/dL — ABNORMAL HIGH (ref 70–99)
Glucose-Capillary: 201 mg/dL — ABNORMAL HIGH (ref 70–99)
Glucose-Capillary: 183 mg/dL — ABNORMAL HIGH (ref 70–99)
Glucose-Capillary: 191 mg/dL — ABNORMAL HIGH (ref 70–99)

## 2022-12-05 LAB — BASIC METABOLIC PANEL
Anion gap: 10 (ref 5–15)
BUN: 135 mg/dL — ABNORMAL HIGH (ref 8–23)
CO2: 20 mmol/L — ABNORMAL LOW (ref 22–32)
Calcium: 10 mg/dL (ref 8.9–10.3)
Chloride: 112 mmol/L — ABNORMAL HIGH (ref 98–111)
Creatinine, Ser: 1.97 mg/dL — ABNORMAL HIGH (ref 0.61–1.24)
GFR, Estimated: 36 mL/min — ABNORMAL LOW (ref >60)
Glucose, Bld: 199 mg/dL — ABNORMAL HIGH (ref 70–99)
Potassium: 4.8 mmol/L (ref 3.5–5.1)
Sodium: 142 mmol/L (ref 135–145)

## 2022-12-05 LAB — CYTOLOGY - NON PAP

## 2022-12-05 LAB — AEROBIC/ANAEROBIC CULTURE W GRAM STAIN (SURGICAL/DEEP WOUND): Culture: NO GROWTH

## 2022-12-05 LAB — MAGNESIUM: Magnesium: 2.7 mg/dL — ABNORMAL HIGH (ref 1.7–2.4)

## 2022-12-05 LAB — PHOSPHORUS: Phosphorus: 4.6 mg/dL (ref 2.5–4.6)

## 2022-12-05 MED ORDER — ALBUMIN HUMAN 25 % IV SOLN
25.0000 g | Freq: Four times a day (QID) | INTRAVENOUS | Status: AC
  Administered 2022-12-05 (×3): 25 g via INTRAVENOUS
  Filled 2022-12-05 (×3): qty 100

## 2022-12-05 MED ORDER — SODIUM CHLORIDE 0.9 % IV BOLUS
250.0000 mL | Freq: Once | INTRAVENOUS | Status: AC
  Administered 2022-12-05: 250 mL via INTRAVENOUS

## 2022-12-05 MED ORDER — LIDOCAINE HCL 1 % IJ SOLN
INTRAMUSCULAR | Status: AC
  Filled 2022-12-05: qty 20

## 2022-12-05 MED ORDER — MIDODRINE HCL 5 MG PO TABS
10.0000 mg | ORAL_TABLET | Freq: Three times a day (TID) | ORAL | Status: DC
  Administered 2022-12-05 – 2022-12-06 (×3): 10 mg via ORAL
  Filled 2022-12-05 (×3): qty 2

## 2022-12-05 MED ORDER — TRAVASOL 10 % IV SOLN: INTRAVENOUS | Status: DC

## 2022-12-05 MED ORDER — ALBUMIN HUMAN 25 % IV SOLN
25.0000 g | Freq: Four times a day (QID) | INTRAVENOUS | Status: DC
  Filled 2022-12-05 (×2): qty 100

## 2022-12-05 MED ORDER — TRAVASOL 10 % IV SOLN
INTRAVENOUS | Status: DC
  Filled 2022-12-05: qty 1155

## 2022-12-05 NOTE — Plan of Care (Signed)

## 2022-12-05 NOTE — Progress Notes (Addendum)
PHARMACY - TOTAL PARENTERAL NUTRITION CONSULT NOTE   Indication: continuation of chronic home TPN  Patient Measurements: Height: 6' (182.9 cm) Weight: 77 kg (169 lb 12.1 oz) IBW/kg (Calculated) : 77.6 TPN AdjBW (KG): 79.1 Body mass index is 23.02 kg/m. 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 Does have J-G tube for feeding/venting, but has been unable to tolerate any jejunal feeding recently. Presents 9/6 with pleural effusions, abdominal distention and ascites, LE edema, and persistent leaking around J-tube insertion site which has worsened, and now appears purulent. 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 Noted to have stable chronic leukocytosis at baseline. Patient will be admitted for rule-out of sepsis; Pharmacy to continue TPN while admitted.  Pharmacist contacted home infusion pharmacist Jill Alexanders with [facility] [phone])  Patient Measurements: Estimated body mass index is 23.02 kg/m as calculated from the following:   Height as of this encounter: 6' (1.829 m).   Weight as of this encounter: 77 kg (169 lb 12.1 oz).  Intake/Output: Net I/O: +958 mL/24 hrs  UOP: -200 mL/24 hrs Drains: -1877 mL/24 hrs 9/6 S/p L  thoracentesis w/ 1700 mls out 9/6 lasix 100 mg total IV x 1; 9/7 lasix 60 IV x 1 9/6 albumin 25 gm q6h x 4 doses 9/7 s/p R thora w/ 900 cc out 9/7 s/p paracentesis w/ 1700 cc out  Estimated Nutritional Needs - Per RD recommendations on 11/30/22 kCal: 2400-2600 Protein: 110-130 grams Fluid: > 2.4 L  [facility] TPN formula:  2100 mls over 12 hours providing 2012 total Kcals, 115 gm protein Lytes per 24 hrs: Na 105: K 107; CL 110: ace 168, Phos 36.5; Ca 10, Mag 10  TPN @ 87.5 mls/hr will provide ~ 115.5 gm protein & ~ 2306 Kcals for 100% support  Recent Labs    12/02/22 0758 12/03/22 0522 12/04/22 0544 12/04/22 1337 12/05/22 0535  NA 139    < > 141 141 140  K 4.5   < > 5.4* 5.4* 4.8  CL 107   < > 112* 112* 112*  CO2 24   < > 19* 22 20*  GLUCOSE 214*   < > 186* 210* 201*  BUN 60*   < > 93* 106* 117*  CREATININE 0.87   < > 1.43* 1.54* 1.62*  CALCIUM 8.8*   < > 10.1 9.9 9.7  PHOS 2.3*   < > 4.3  --  4.6  MG 2.3   < > 2.6*  --  2.7*  ALBUMIN 2.1*   < > 2.1*  --  2.0*  ALKPHOS 112   < > 138*  --  146*  AST 29   < > 28  --  29  ALT 28   < > 24  --  22  BILITOT 1.1   < > 1.1  --  1.0  TRIG 133  --   --   --   --    < > = values in this interval not displayed.   Insulin Requirements: no hx DM, no insulin in TPN PTA.  -CBGs borderline elevated 165-210 on TPN @ 87.5 ml/hr (Goal < 180) since increasing to moderate SSI 9/9 -17 units SSI/24 hrs  Lytes:  -K 5.4>>4.8 from yesterday (WNL) -CO2 20 (low) -Mg 2.6>>2.7 from yesterday (remains elevated) -Corrected Ca 11.6>>11.2 from yesterday (remains elevated) -Phos 4.3>>4.6 from yesterday (WNL) Renal:   -BUN continuing to increase--at 117 today -Scr continuing  to increase--at 1.62 today LFTs:  -Albumin 2.0 (low) -Alk phos 146 (slightly elevated) -TGs 133 (stable) Current Nutrition: NPO, cyclic TPN PTA 9/8 TPN 87.5 ml/hr>> IVF: none Central access: PAC  TPN start date: PTA  PLAN                                                                                                                         At 1800 today: Continue TPN at 87.5 ml/hr. TPN @ 87.5 mls/hr will provide ~ 115.5 gm protein & ~ 2306 Kcals for 100% support Electrolytes in TPN: Standard:  Sodium 50 mEq/L Increase K to 10 mEq/L  Continue Ca at 0 mEq/L  Continue Mg at 0 mEq/L  Decrease Phos to 5 mmol/L Cl:Ac ratio maximize acetate TPN to contain standard multivitamins and trace elements. Continue moderate SSI q4h TPN lab panels on Mondays & Thursdays F/u CMP, Mg, Phos tomorrow morning Check a repeat BMP at 1400 today to f/u K trend F/u goals of care discussion with family 9/12   Cherylin Mylar,  PharmD Clinical Pharmacist  9/12/20247:31 AM

## 2022-12-05 NOTE — Progress Notes (Signed)
trending up-> 1.6 -Started on midodrine 10 mg 3 times daily, IV albumin  Generalized deconditioning, debility -PT OT evaluation as requested by family  Estimated body mass index is 23.02 kg/m as calculated from the following:   Height as of this encounter: 6' (1.829 m).   Weight as of this encounter: 77 kg.  Code Status: DNR DVT Prophylaxis:  enoxaparin (LOVENOX) injection 40 mg Start: 12/16/2022 2200   Level of Care: Level of care: Med-Surg Family Communication: Updated patient's multiple family members in the  room including sister, brother, niece, sisters husband at the bedside. Disposition Plan:      Remains inpatient appropriate:   Plan for paracentesis today.  If still having symptomatic dyspnea, may try thoracentesis tomorrow.  PT evaluation.  Possible DC tomorrow evening or Saturday morning.   Procedures:    Consultants:   Oncology General Surgery Palliative medicine  Antimicrobials:   Anti-infectives (From admission, onward)    Start     Dose/Rate Route Frequency Ordered Stop   12/05/22 0200  ceFEPIme (MAXIPIME) 2 g in sodium chloride 0.9 % 100 mL IVPB        2 g 200 mL/hr over 30 Minutes Intravenous Every 12 hours 12/04/22 1438 12/23/2022 0159   12/03/22 1307  ceFEPIme (MAXIPIME) 2 g in sodium chloride 0.9 % 100 mL IVPB  Status:  Discontinued        2 g 200 mL/hr over 30 Minutes Intravenous Every 8 hours 12/03/22 1307 12/04/22 1438   12/03/22 1307  metroNIDAZOLE (FLAGYL) IVPB 500 mg        500 mg 100 mL/hr over 60 Minutes Intravenous Every 12 hours 12/03/22 1307     12/03/2022 2200  metroNIDAZOLE (FLAGYL) IVPB 500 mg  Status:  Discontinued        500 mg 100 mL/hr over 60 Minutes Intravenous Every 12 hours 12/21/2022 1404 12/03/22 0838   12/06/2022 2200  ceFEPIme (MAXIPIME) 2 g in sodium chloride 0.9 % 100 mL IVPB  Status:  Discontinued        2 g 200 mL/hr over 30 Minutes Intravenous Every 8 hours 12/15/2022 1418 12/03/22 0838   12/16/2022 2200  vancomycin (VANCOREADY) IVPB 1250 mg/250 mL  Status:  Discontinued        1,250 mg 166.7 mL/hr over 90 Minutes Intravenous Every 12 hours 12/09/2022 1418 11/30/22 0956   12/13/2022 1230  ceFEPIme (MAXIPIME) 2 g in sodium chloride 0.9 % 100 mL IVPB        2 g 200 mL/hr over 30 Minutes Intravenous  Once 12/09/2022 1224 12/12/2022 1327   12/05/2022 1230  metroNIDAZOLE (FLAGYL) IVPB 500 mg        500 mg 100 mL/hr over 60 Minutes Intravenous  Once 11/26/2022 1224 12/17/2022 1401   12/02/2022 1230  vancomycin (VANCOCIN) IVPB 1000 mg/200 mL premix  Status:   Discontinued        1,000 mg 200 mL/hr over 60 Minutes Intravenous  Once 12/18/2022 1224 12/18/2022 1228   11/27/2022 1230  vancomycin (VANCOREADY) IVPB 1500 mg/300 mL        1,500 mg 150 mL/hr over 120 Minutes Intravenous  Once 11/27/2022 1228 12/02/2022 1600          Medications  Chlorhexidine Gluconate Cloth  6 each Topical Daily   enoxaparin (LOVENOX) injection  40 mg Subcutaneous Q24H   insulin aspart  0-15 Units Subcutaneous Q4H   midodrine  10 mg Oral TID WC   nystatin  5 mL Oral QID   mouth rinse  [facility]                                                                              Steven Wolf, is a 67 y.o. 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 Recent PET scan 11/28/2022 showed new findings of hypermetabolic nodule metathesis and probably progressive peritoneal carcinomatosis, increased ascites although diabetes mellitus could have similar finding.  He was also noted to have bilateral new pleural effusions which was worse on the left. Chest x-ray showed pleural effusion, underwent thoracentesis 1.7 L from left and 0.9L from right Surgery and wound care consulted Surgery did not feel he needs a surgical intervention at this time and recommended palliative goals of care. Pleural fluid appeared exudative, PCCM signed off on 9/9 Seen by Dr. 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 Mosetta Putt is following and patient planning palliative care at home with few more doses of immunotherapy - continue TPN for nutrition       Shortness of breath, pleural effusion bilateral Peripheral edema, ascites Hypoalbuminemia with third spacing: - COVID RSV influenza panel negative  - Thoracentesis by CCM on 9/6 removed 1.7 L from left and 0.9 from right, received IV albumin and Lasix - Cultures negative, cytology pending, currently on IV cefepime, Flagyl -Had undergone paracentesis on 9/7, 1.7 L removed - TTE, LV grossly normal function - LE duplex negative for DVT -Patient noted to be somewhat more short of breath, distended abdomen today -Chest x-ray showed new bilateral pleural effusions and/or infiltrates in both lower lobes, L>R -Ordered US paracentesis, with IV albumin x3 - s/p paracentesis today monitor removed, continue IV albumin, post paracentesis noted to be hypotensive and tachycardiac.  - Placed 250 cc IV bolus with midodrine 10 mg 3 times daily   Suspected sepsis initially Leukocytosis  -Afebrile, procalcitonin borderline 0.6. -Initially concern for peritonitis and possible infection around J-tube received empiric cefepime and Flagyl. -Pleural fluid culture showed no growth.  UA unremarkable. -Paracentesis completed, 1.7 L fluid removed, negative for SBP on 9/7 -Status post therapeutic paracentesis today   Normocytic Anemia, Anemia of chronic disease: H&H stable    Hypokalemia K 5.4   Oral Thrush Cont nystatin   T2DM: Blood sugar stable  Acute kidney injury -Creatinine slowly  [facility]                                                                              Steven Wolf, is a 67 y.o. 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 Recent PET scan 11/28/2022 showed new findings of hypermetabolic nodule metathesis and probably progressive peritoneal carcinomatosis, increased ascites although diabetes mellitus could have similar finding.  He was also noted to have bilateral new pleural effusions which was worse on the left. Chest x-ray showed pleural effusion, underwent thoracentesis 1.7 L from left and 0.9L from right Surgery and wound care consulted Surgery did not feel he needs a surgical intervention at this time and recommended palliative goals of care. Pleural fluid appeared exudative, PCCM signed off on 9/9 Seen by Dr. 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 Mosetta Putt is following and patient planning palliative care at home with few more doses of immunotherapy - continue TPN for nutrition       Shortness of breath, pleural effusion bilateral Peripheral edema, ascites Hypoalbuminemia with third spacing: - COVID RSV influenza panel negative  - Thoracentesis by CCM on 9/6 removed 1.7 L from left and 0.9 from right, received IV albumin and Lasix - Cultures negative, cytology pending, currently on IV cefepime, Flagyl -Had undergone paracentesis on 9/7, 1.7 L removed - TTE, LV grossly normal function - LE duplex negative for DVT -Patient noted to be somewhat more short of breath, distended abdomen today -Chest x-ray showed new bilateral pleural effusions and/or infiltrates in both lower lobes, L>R -Ordered US paracentesis, with IV albumin x3 - s/p paracentesis today monitor removed, continue IV albumin, post paracentesis noted to be hypotensive and tachycardiac.  - Placed 250 cc IV bolus with midodrine 10 mg 3 times daily   Suspected sepsis initially Leukocytosis  -Afebrile, procalcitonin borderline 0.6. -Initially concern for peritonitis and possible infection around J-tube received empiric cefepime and Flagyl. -Pleural fluid culture showed no growth.  UA unremarkable. -Paracentesis completed, 1.7 L fluid removed, negative for SBP on 9/7 -Status post therapeutic paracentesis today   Normocytic Anemia, Anemia of chronic disease: H&H stable    Hypokalemia K 5.4   Oral Thrush Cont nystatin   T2DM: Blood sugar stable  Acute kidney injury -Creatinine slowly  [facility]                                                                              Steven Wolf, is a 67 y.o. 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 Recent PET scan 11/28/2022 showed new findings of hypermetabolic nodule metathesis and probably progressive peritoneal carcinomatosis, increased ascites although diabetes mellitus could have similar finding.  He was also noted to have bilateral new pleural effusions which was worse on the left. Chest x-ray showed pleural effusion, underwent thoracentesis 1.7 L from left and 0.9L from right Surgery and wound care consulted Surgery did not feel he needs a surgical intervention at this time and recommended palliative goals of care. Pleural fluid appeared exudative, PCCM signed off on 9/9 Seen by Dr. 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 Mosetta Putt is following and patient planning palliative care at home with few more doses of immunotherapy - continue TPN for nutrition       Shortness of breath, pleural effusion bilateral Peripheral edema, ascites Hypoalbuminemia with third spacing: - COVID RSV influenza panel negative  - Thoracentesis by CCM on 9/6 removed 1.7 L from left and 0.9 from right, received IV albumin and Lasix - Cultures negative, cytology pending, currently on IV cefepime, Flagyl -Had undergone paracentesis on 9/7, 1.7 L removed - TTE, LV grossly normal function - LE duplex negative for DVT -Patient noted to be somewhat more short of breath, distended abdomen today -Chest x-ray showed new bilateral pleural effusions and/or infiltrates in both lower lobes, L>R -Ordered US paracentesis, with IV albumin x3 - s/p paracentesis today monitor removed, continue IV albumin, post paracentesis noted to be hypotensive and tachycardiac.  - Placed 250 cc IV bolus with midodrine 10 mg 3 times daily   Suspected sepsis initially Leukocytosis  -Afebrile, procalcitonin borderline 0.6. -Initially concern for peritonitis and possible infection around J-tube received empiric cefepime and Flagyl. -Pleural fluid culture showed no growth.  UA unremarkable. -Paracentesis completed, 1.7 L fluid removed, negative for SBP on 9/7 -Status post therapeutic paracentesis today   Normocytic Anemia, Anemia of chronic disease: H&H stable    Hypokalemia K 5.4   Oral Thrush Cont nystatin   T2DM: Blood sugar stable  Acute kidney injury -Creatinine slowly

## 2022-12-05 NOTE — Evaluation (Signed)
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  Recent PET scan 11/28/2022 showed new findings of hypermetabolic nodule metathesis and probably progressive peritoneal carcinomatosis, increased ascites. Pt also found to have B pleural effusions.  Clinical Impression  Pt admitted with above diagnosis. Pt ambulated 45' with RW, distance limited by 3/4 dyspnea and fatigue, SpO2 81% on room air walking, 94% on room air at rest, HR 129 walking. Pt has stairs to enter his home which may be a challenge if his strength and endurance don't improve.  Pt currently with functional limitations due to the deficits listed below (see PT Problem List). Pt will benefit from acute skilled PT to increase their independence and safety with mobility to allow discharge.           If plan is discharge home, recommend the following: A little help with walking and/or transfers;A lot of help with bathing/dressing/bathroom;Assistance with cooking/housework;Assist for transportation;Help with stairs or ramp for entrance   Can travel by private vehicle        Equipment Recommendations Wheelchair (measurements PT);Wheelchair cushion (measurements PT);BSC/3in1  Recommendations for Other Services       Functional Status Assessment Patient has had a recent decline in their functional status and demonstrates the ability to make significant improvements in function in a reasonable and predictable amount of time.     Precautions / Restrictions Precautions Precautions:  Fall Precaution Comments: monitor HR/SpO2 Restrictions Weight Bearing Restrictions: No      Mobility  Bed Mobility Overal bed mobility: Modified Independent             General bed mobility comments: HOB up, used rail    Transfers Overall transfer level: Needs assistance Equipment used: Rolling walker (2 wheels) Transfers: Sit to/from Stand Sit to Stand: Contact guard assist, From elevated surface           General transfer comment: VCs hand placement    Ambulation/Gait Ambulation/Gait assistance: Contact guard assist Gait Distance (Feet): 45 Feet Assistive device: Rolling walker (2 wheels) Gait Pattern/deviations: Step-through pattern, Decreased step length - right, Decreased step length - left, Trunk flexed Gait velocity: decr     General Gait Details: distance limited by fatigue, SpO2 81% on room air walking, HR 129 walking, 3/4 dyspnea; VCs for posture  Stairs            Wheelchair Mobility     Tilt Bed    Modified Rankin (Stroke Patients Only)       Balance Overall balance assessment: Needs assistance Sitting-balance support: Feet supported, No upper extremity supported Sitting balance-Leahy Scale: Fair     Standing balance support: Bilateral upper extremity supported, During functional activity, Reliant on assistive device for balance Standing balance-Leahy Scale: Poor                               Pertinent Vitals/Pain Pain Assessment Pain Assessment: 0-10 Pain Score: 2  Pain Location: R chest (thoracentesis site) Pain Descriptors / Indicators: Sore Pain Intervention(s): Limited activity within patient's tolerance, Monitored during session    Home Living Family/patient  expects to be discharged to:: Private residence Living Arrangements: Spouse/significant other Available Help at Discharge: Family Type of Home: House Home Access: Stairs to enter Entrance Stairs-Rails: Right Entrance Stairs-Number of Steps:  8 Alternate Level Stairs-Number of Steps: 1/2 bath on main level, hospital bed on main level Home Layout: Two level;Able to live on main level with bedroom/bathroom Home Equipment: Rolling Walker (2 wheels);Rollator (4 wheels);Cane - single point;Hospital bed Additional Comments: 1/2 bath on main level, hospital bed on main level    Prior Function Prior Level of Function : Independent/Modified Independent;Driving             Mobility Comments: was walking with a cane prior to this admission; no falls in past 6 months ADLs Comments: assist needed     Extremity/Trunk Assessment   Upper Extremity Assessment Upper Extremity Assessment: Defer to OT evaluation    Lower Extremity Assessment Lower Extremity Assessment: Generalized weakness (knee ext +4/5)    Cervical / Trunk Assessment Cervical / Trunk Assessment: Kyphotic  Communication   Communication Communication: No apparent difficulties  Cognition Arousal: Lethargic Behavior During Therapy: Flat affect Overall Cognitive Status: Within Functional Limits for tasks assessed                                          General Comments      Exercises General Exercises - Lower Extremity Ankle Circles/Pumps: AROM, Both, 10 reps, Seated Quad Sets: AROM, Both, 5 reps, Supine   Assessment/Plan    PT Assessment Patient needs continued PT services  PT Problem List Decreased activity tolerance;Decreased mobility;Cardiopulmonary status limiting activity;Decreased strength       PT Treatment Interventions Gait training;Therapeutic exercise;Patient/family education;Therapeutic activities;Functional mobility training;Stair training;Balance training;DME instruction    PT Goals (Current goals can be found in the Care Plan section)  Acute Rehab PT Goals Patient Stated Goal: to DC home PT Goal Formulation: With patient/family Time For Goal Achievement: 12/19/22 Potential to Achieve Goals: Fair    Frequency Min  1X/week     Co-evaluation               AM-PAC PT "6 Clicks" Mobility  Outcome Measure Help needed turning from your back to your side while in a flat bed without using bedrails?: None Help needed moving from lying on your back to sitting on the side of a flat bed without using bedrails?: A Little Help needed moving to and from a bed to a chair (including a wheelchair)?: A Little Help needed standing up from a chair using your arms (e.g., wheelchair or bedside chair)?: A Lot Help needed to walk in hospital room?: A Little Help needed climbing 3-5 steps with a railing? : A Lot 6 Click Score: 17    End of Session Equipment Utilized During Treatment: Gait belt Activity Tolerance: Patient limited by fatigue Patient left: in chair;with call bell/phone within reach;with family/visitor present Nurse Communication: Mobility status;Other (comment) (desaturation with walking) PT Visit Diagnosis: Muscle weakness (generalized) (M62.81);Difficulty in walking, not elsewhere classified (R26.2);Other abnormalities of gait and mobility (R26.89)    Time: 1310-1340 PT Time Calculation (min) (ACUTE ONLY): 30 min   Charges:   PT Evaluation $PT Eval Moderate Complexity: 1 Mod PT Treatments $Gait Training: 8-22 mins PT General Charges $$ ACUTE PT VISIT: 1 Visit       Tamala Ser PT 12/05/2022  Acute Rehabilitation Services  Office [phone]

## 2022-12-05 NOTE — Progress Notes (Signed)
Nutrition Follow-up  INTERVENTION:   -TPN management per Pharmacy  NUTRITION DIAGNOSIS:   Inadequate oral intake related to altered GI function (gastric outlet obstruction, fistula) as evidenced by NPO status.  Ongoing.  GOAL:   Patient will meet greater than or equal to 90% of their needs  Meeting with TPN  MONITOR:   Labs, Weight trends, Skin, I & O's, Other (Comment) (TPN regimen, GOC)  ASSESSMENT:   67 year old male who presented to the ED on 9/06 with SOB, abdominal distention, and leaking J-tube. 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 Pt admitted with large L pleural effusion, small R pleural effusion, and concern for sepsis.  9/6 - admitted, s/p thoracentesis with 1700 ml fluid drained from L pleural space  9/7 -s/p thoracentesis, yield: 1.7L 9/10: J-tube removed 9/12 -s/p paracentesis, yield: 1L  Per palliative care note, pt and family would like pt to discharge home with TPN in hopes pt will be able to pursue immunotherapy.   TPN continues at home rate: 87.5 ml/hr, providing 2306 kcals and 115g protein.    Admission weight: 174 lbs Current weight: 169 lbs  Medications reviewed.  Labs reviewed: CBGs: 183-201 Elevated Mg  Diet Order:   Diet Order             Diet NPO time specified Except for: Ice Chips, Sips with Meds  Diet effective now                   EDUCATION NEEDS:   Not appropriate for education at this time  Skin:  Skin Assessment: Skin Integrity Issues: Skin Integrity Issues:: Other (Comment) Other: MASD around J-tube site due to persistent leaking  Last BM:  no documented BM  Height:   Ht Readings from Last 1 Encounters:  12/18/2022 6' (1.829 m)    Weight:   Wt Readings from Last 1 Encounters:  12/04/22 77 kg     BMI:  Body mass index is 23.02 kg/m.  Estimated Nutritional Needs:   Kcal:  2400-2600  Protein:  110-130 grams  Fluid:  >2.4 L   Tilda Franco, MS, RD, LDN Inpatient Clinical Dietitian Contact information available via Amion

## 2022-12-05 NOTE — Progress Notes (Signed)
   Arrived in room to discuss with the pt and his wife the difference between hospice care and our [facility] program Care connection.   There was also multiple other family members present. Pt was taken down toward the end of this visit for his paracentesis. He requires to people to assist him to the wheelchair.    I was able to answer questions about both program Hospice care and Palliative Care. The wife states that she and [name] want to proceed with Palliative care at home and reinstate the [facility] services and and TPN deliveries. They want pt to follow up with Dr. Mosetta Putt after d/c  and are hopeful he will get strong enough to have immunotherapy.   Norm Parcel RN [phone]

## 2022-12-05 NOTE — Progress Notes (Signed)
PT Cancellation Note  Patient Details Name: Steven Wolf MRN: [id number] DOB: July 06, 1955   Cancelled Treatment:    Reason Eval/Treat Not Completed: Patient at procedure or test/unavailable. Will follow.    Ralene Bathe Kistler PT 12/05/2022  Acute Rehabilitation Services  Office [phone]

## 2022-12-05 NOTE — Progress Notes (Signed)
Steven Wolf   DOB:1955-12-05   WR#:[id number]   XBJ#:[id number]  Medical oncology follow-up  Subjective: Patient underwent paracentesis today, with 1 L fluid removed.  He unfortunately developed hypotension after procedure, required albumin infusion, IV fluids, midodrine.  He still has dyspnea at rest, with tachypnea.  His wife and multiple family members were in the room when I saw him in the late afternoon.  Objective:  Vitals:   12/05/22 1348 12/05/22 1610  BP: (!) 165/123 (!) 95/54  Pulse: (!) 129 (!) 124  Resp:  18  Temp:  (!) 97.4 F (36.3 C)  SpO2: (!) 81% 97%    Body mass index is 23.02 kg/m.  Intake/Output Summary (Last 24 hours) at 12/05/2022 1919 Last data filed at 12/05/2022 1802 Gross per 24 hour  Intake 3426.55 ml  Output 1600 ml  Net 1826.55 ml     Sclerae unicteric, mild to moderate respite distress due to tachypnea  Abdomen slightly distended, (+) pouch bag at left side abdomen with dark green liquid  MSK no focal spinal tenderness, no peripheral edema  Neuro nonfocal    CBG (last 3)  Recent Labs    12/05/22 0306 12/05/22 0738 12/05/22 1150  GLUCAP 201* 183* 191*     Labs:   Urine Studies No results for input(s): "UHGB", "CRYS" in the last 72 hours.  Invalid input(s): "UACOL", "UAPR", "USPG", "UPH", "UTP", "UGL", "UKET", "UBIL", "UNIT", "UROB", "ULEU", "UEPI", "UWBC", "URBC", "UBAC", "CAST", "UCOM", "BILUA"  Basic Metabolic Panel: Recent Labs  Lab 12/01/22 0414 12/02/22 0758 12/03/22 0522 12/04/22 0544 12/04/22 1337 12/05/22 0535 12/05/22 1537  NA 140 139 140 141 141 140 142  K 3.4* 4.5 5.0 5.4* 5.4* 4.8 4.8  CL 105 107 110 112* 112* 112* 112*  CO2 25 24 20* 19* 22 20* 20*  GLUCOSE 228* 214* 196* 186* 210* 201* 199*  BUN 48* 60* 76* 93* 106* 117* 135*  CREATININE 0.95 0.87 1.12 1.43* 1.54* 1.62* 1.97*  CALCIUM 8.6* 8.8* 9.7 10.1 9.9 9.7 10.0  MG 2.1 2.3 2.5* 2.6*  --  2.7*  --   PHOS 3.1 2.3* 3.4 4.3  --  4.6  --    GFR Estimated  Creatinine Clearance: 38.5 mL/min (A) (by C-G formula based on SCr of 1.97 mg/dL (H)). Liver Function Tests: Recent Labs  Lab 12/01/22 0414 12/02/22 0758 12/03/22 0522 12/04/22 0544 12/05/22 0535  AST 35 29 33 28 29  ALT 25 28 28 24 22   ALKPHOS 100 112 143* 138* 146*  BILITOT 1.7* 1.1 1.1 1.1 1.0  PROT 5.5* 5.4* 5.9* 5.6* 5.8*  ALBUMIN 2.4* 2.1* 2.3* 2.1* 2.0*   No results for input(s): "LIPASE", "AMYLASE" in the last 168 hours. No results for input(s): "AMMONIA" in the last 168 hours. Coagulation profile Recent Labs  Lab 23-Dec-2022 1130  INR 1.2    CBC: Recent Labs  Lab 12-23-22 1130 12-23-22 1530 11/30/22 0500 12/01/22 0414  WBC 23.5* 20.8* 18.1* 18.7*  NEUTROABS 19.7*  --   --  16.5*  HGB 10.5* 10.0* 9.1* 10.1*  HCT 32.8* 31.5* 28.2* 31.2*  MCV 90.4 90.3 88.1 88.9  PLT 346 307 256 303   Cardiac Enzymes: No results for input(s): "CKTOTAL", "CKMB", "CKMBINDEX", "TROPONINI" in the last 168 hours. BNP: Invalid input(s): "POCBNP" CBG: Recent Labs  Lab 12/04/22 2012 12/04/22 2317 12/05/22 0306 12/05/22 0738 12/05/22 1150  GLUCAP 183* 191* 201* 183* 191*   D-Dimer No results for input(s): "DDIMER" in the last 72 hours. Hgb A1c No  results for input(s): "HGBA1C" in the last 72 hours. Lipid Profile No results for input(s): "CHOL", "HDL", "LDLCALC", "TRIG", "CHOLHDL", "LDLDIRECT" in the last 72 hours.  Thyroid function studies No results for input(s): "TSH", "T4TOTAL", "T3FREE", "THYROIDAB" in the last 72 hours.  Invalid input(s): "FREET3" Anemia work up No results for input(s): "VITAMINB12", "FOLATE", "FERRITIN", "TIBC", "IRON", "RETICCTPCT" in the last 72 hours. Microbiology Recent Results (from the past 240 hour(s))  Blood culture (routine x 2)     Status: None   Collection Time: 11/27/2022 11:20 AM   Specimen: BLOOD RIGHT HAND  Result Value Ref Range Status   Specimen Description   Final    BLOOD RIGHT HAND Performed at [facility], [address] [place], [place] [address]    Special Requests   Final    BOTTLES DRAWN AEROBIC AND ANAEROBIC Blood Culture adequate volume Performed at [facility], [address], [place], [place] [address]    Culture   Final    NO GROWTH 5 DAYS Performed at [facility], [address] [place], [place] [address]    Report Status 12/04/2022 FINAL  Final  Blood culture (routine x 2)     Status: None   Collection Time: 12/11/2022 11:20 AM   Specimen: BLOOD  Result Value Ref Range Status   Specimen Description   Final    BLOOD LEFT ANTECUBITAL Performed at [facility], [address] [place], [place] [address]    Special Requests   Final    BOTTLES DRAWN AEROBIC AND ANAEROBIC Blood Culture adequate volume Performed at [facility], [address] [place], [place] [address]    Culture   Final    NO GROWTH 5 DAYS Performed at [facility], [address], [place], [place] [address]    Report Status 12/04/2022 FINAL  Final  Resp panel by RT-PCR (RSV, Flu A&B, Covid) Anterior Nasal Swab     Status: None   Collection Time: 12/19/2022 12:00 PM   Specimen: Anterior Nasal Swab  Result Value Ref Range Status   SARS Coronavirus 2 by RT PCR NEGATIVE NEGATIVE Final    Comment: (NOTE) SARS-CoV-2 target nucleic acids are NOT DETECTED.  The SARS-CoV-2 RNA is generally detectable in upper respiratory specimens during the acute phase of infection. The lowest concentration of SARS-CoV-2 viral copies this assay can detect is 138 copies/mL. A negative result does not preclude SARS-Cov-2 infection and should not be used as the sole basis for treatment or other patient management decisions. A negative result may occur with  improper specimen collection/handling, submission of specimen other than nasopharyngeal swab, presence of viral mutation(s) within the areas targeted by this assay, and inadequate number of viral copies(<138  copies/mL). A negative result must be combined with clinical observations, patient history, and epidemiological information. The expected result is Negative.  Fact Sheet for Patients:  BloggerCourse.com  Fact Sheet for Healthcare Providers:  SeriousBroker.it  This test is no t yet approved or cleared by the [place] FDA and  has been authorized for detection and/or diagnosis of SARS-CoV-2 by FDA under an Emergency Use Authorization (EUA). This EUA will remain  in effect (meaning this test can be used) for the duration of the COVID-19 declaration under Section 564(b)(1) of the Act, 21 U.S.C.section 360bbb-3(b)(1), unless the authorization is terminated  or revoked sooner.       Influenza A by PCR NEGATIVE NEGATIVE Final   Influenza B by PCR  NEGATIVE NEGATIVE Final    Comment: (NOTE) The Xpert Xpress SARS-CoV-2/FLU/RSV plus assay is intended as an aid in the diagnosis of influenza from Nasopharyngeal swab specimens and should not be used as a sole basis for treatment. Nasal washings and aspirates are unacceptable for Xpert Xpress SARS-CoV-2/FLU/RSV testing.  Fact Sheet for Patients: BloggerCourse.com  Fact Sheet for Healthcare Providers: SeriousBroker.it  This test is not yet approved or cleared by the [place] FDA and has been authorized for detection and/or diagnosis of SARS-CoV-2 by FDA under an Emergency Use Authorization (EUA). This EUA will remain in effect (meaning this test can be used) for the duration of the COVID-19 declaration under Section 564(b)(1) of the Act, 21 U.S.C. section 360bbb-3(b)(1), unless the authorization is terminated or revoked.     Resp Syncytial Virus by PCR NEGATIVE NEGATIVE Final    Comment: (NOTE) Fact Sheet for Patients: BloggerCourse.com  Fact Sheet for Healthcare  Providers: SeriousBroker.it  This test is not yet approved or cleared by the [place] FDA and has been authorized for detection and/or diagnosis of SARS-CoV-2 by FDA under an Emergency Use Authorization (EUA). This EUA will remain in effect (meaning this test can be used) for the duration of the COVID-19 declaration under Section 564(b)(1) of the Act, 21 U.S.C. section 360bbb-3(b)(1), unless the authorization is terminated or revoked.  Performed at [facility], [address] [place], [place] [address]   MRSA Next Gen by PCR, Nasal     Status: None   Collection Time: 2022/12/03  3:45 PM   Specimen: Nasal Mucosa; Nasal Swab  Result Value Ref Range Status   MRSA by PCR Next Gen NOT DETECTED NOT DETECTED Final    Comment: (NOTE) The GeneXpert MRSA Assay (FDA approved for NASAL specimens only), is one component of a comprehensive MRSA colonization surveillance program. It is not intended to diagnose MRSA infection nor to guide or monitor treatment for MRSA infections. Test performance is not FDA approved in patients less than 60 years old. Performed at [facility], [address] [place], [place] [address]   Fungus Culture With Stain     Status: None (Preliminary result)   Collection Time: 11/30/22  1:02 PM   Specimen: PATH Cytology Peritoneal fluid  Result Value Ref Range Status   Fungus Stain Final report  Final    Comment: (NOTE) Performed At: [facility] [address] [place], [place] [id number] Jolene Schimke MD EX:[id number]    Fungus (Mycology) Culture PENDING  Incomplete   Fungal Source PERITONEAL  Final    Comment: Performed at [facility], [address], [place], [place] [address]  Aerobic/Anaerobic Culture w Gram Stain (surgical/deep wound)     Status: None   Collection Time: 11/30/22  1:02 PM   Specimen: PATH Cytology Peritoneal fluid  Result Value Ref Range Status    Specimen Description   Final    PERITONEAL Performed at [facility], [address] [place], [place] [address]    Special Requests   Final    NONE Performed at [facility], [address] [place], [place] [address]    Gram Stain   Final    RARE WBC PRESENT, PREDOMINANTLY PMN NO ORGANISMS SEEN    Culture   Final    No growth aerobically or anaerobically. Performed at [facility], [address] [place], [place] [address]    Report Status 12/05/2022 FINAL  Final  Fungus Culture Result  Status: None   Collection Time: 11/30/22  1:02 PM  Result Value Ref Range Status   Result 1 Comment  Final    Comment: (NOTE) KOH/Calcofluor preparation:  no fungus observed. Performed At: [facility] [address] [place], [place] [id number] Jolene Schimke MD GE:[id number]   Body fluid culture w Gram Stain     Status: None   Collection Time: 11/30/22  3:37 PM   Specimen: Pleural Fluid  Result Value Ref Range Status   Specimen Description   Final    PLEURAL Performed at [facility], [address] [place], [place] [address]    Special Requests   Final    NONE Performed at [facility], [address], [place], [place] [address]    Gram Stain   Final    RARE WBC PRESENT, PREDOMINANTLY PMN NO ORGANISMS SEEN    Culture   Final    NO GROWTH 3 DAYS Performed at [facility], [address] [place], [place] [address]    Report Status 12/04/2022 FINAL  Final  Fungus Culture With Stain     Status: None (Preliminary result)   Collection Time: 11/30/22  3:37 PM   Specimen: Pleural Fluid  Result Value Ref Range Status   Fungus Stain Final report  Final    Comment: (NOTE) Performed At: [facility] [address] [place], [place] [id number] Jolene Schimke MD VF:[id number]    Fungus (Mycology) Culture PENDING  Incomplete   Fungal Source PLEURAL  Final    Comment:  Performed at [facility], [address], [place], [place] [address]  Acid Fast Smear (AFB)     Status: None   Collection Time: 11/30/22  3:37 PM   Specimen: Lung, Right; Pleural Fluid  Result Value Ref Range Status   AFB Specimen Processing Concentration  Final   Acid Fast Smear Negative  Final    Comment: (NOTE) Performed At: [facility] [address] [place], [place] [id number] Jolene Schimke MD NA:[id number]    Source (AFB) PLEURAL  Final    Comment: Performed at [facility], [address], [place], [place] [address]  Fungus Culture Result     Status: None   Collection Time: 11/30/22  3:37 PM  Result Value Ref Range Status   Result 1 Comment  Final    Comment: (NOTE) KOH/Calcofluor preparation:  no fungus observed. Performed At: [facility] [address] [place], [place] [id number] Jolene Schimke MD VV:[id number]       Studies:  US Paracentesis  Result Date: 12/05/2022 INDICATION: Metastatic gastric cancer and peritoneal carcinomatosis with ascites. Request received for therapeutic paracentesis. EXAM: ULTRASOUND GUIDED  PARACENTESIS MEDICATIONS: 6 cc 1% lidocaine COMPLICATIONS: None immediate. PROCEDURE: Informed written consent was obtained from the patient after a discussion of the risks, benefits and alternatives to treatment. A timeout was performed prior to the initiation of the procedure. Initial ultrasound scanning demonstrates a moderate amount of ascites within the left upper abdominal quadrant. The left upper abdomen was prepped and draped in the usual sterile fashion. 1% lidocaine was used for local anesthesia. Following this, a 19 gauge, 7-cm, Yueh catheter was introduced. An ultrasound image was saved for documentation purposes. The paracentesis was performed. The catheter was removed and a dressing was applied. The patient tolerated the procedure well without immediate post procedural complication. FINDINGS:  A total of approximately 1 L of yellow fluid was removed. Ordering provider did not request laboratory samples. IMPRESSION:  Successful ultrasound-guided paracentesis yielding 1 liter of peritoneal fluid. Procedure performed by Mina Marble, PA-C Electronically Signed   By: Marliss Coots M.D.   On: 12/05/2022 12:11   DG Chest Port 1 View  Result Date: 12/04/2022 CLINICAL DATA:  Shortness of breath EXAM: PORTABLE CHEST 1 VIEW COMPARISON:  11/30/2022 FINDINGS: Power port in place from a left-sided approach with the tip at the SVC RA junction. The patient has developed bilateral pleural effusions with atelectasis and or infiltrate in both lower lobes. The upper lungs are clear. IMPRESSION: New bilateral pleural effusions with atelectasis and or infiltrate in both lower lobes. Electronically Signed   By: Paulina Fusi M.D.   On: 12/04/2022 16:37    Assessment: 67 y.o. male   Metastatic gastric cancer with gastric outlet obstruction, peritoneal carcinomatosis. Significant leakage from the J-tube insertion site Severe protein and calorie malnutrition, on TPN Dyspnea on exertion, secondary to bilateral pleural effusion and abdominal distention Malignant ascites, status post paracentesis today Normocytic anemia, leukocytosis likely reactive Type 2 diabetes Severe deconditioning    Plan:  -Patient's overall condition has declined further, he is very weak, not able to walk much.  He remains to be tachycardic and tachypneic.  He developed hypotension after paracentesis with 1 L of fluids removed. -I discussed the overall very poor prognosis with patient, his wife and his rest of his family.  His life expectancy is very likely a few weeks to months.  I do not think he is a candidate for more cancer treatment including immunotherapy. -Patient and his family wants to go home with palliative care, I strongly encouraged him to consider hospice, due to the care he needs at home. I am concerned that he will be  back to hospital very quickly after discharge. -I again reviewed the logistics of hospice with patient and his family, they will discuss again tonight. -All communicated with Dr. Isidoro Donning and Dr. Patterson Hammersmith, so they can follow up tomorrow. I will be out of office tomorrow but available by phone. -I spent a total of 50 minutes for his visit today, more than 50% time on face-to-face counseling.   [name], MD 12/05/2022  7:19 PM

## 2022-12-05 NOTE — Progress Notes (Signed)
Daily Progress Note   Patient Name: Steven Wolf       Date: 12/05/2022 DOB: 1955-06-27  Age: 67 y.o. MRN#: [id number] Attending Physician: Cathren Harsh, MD Primary Care Physician: Pincus Sanes, MD Admit Date: 2022/12/24 Length of Stay: 6 days  Reason for Consultation/Follow-up: Establishing goals of care  Subjective:   CC: Patient still planning to follow up with oncology in the outpatient setting.  Following up regarding complex medical decision making.  Subjective:  Reviewed EMR prior to presenting to bedside.  Discussed care with IDT extensively prior to presenting to bedside.  Oncologist had discussed with patient and wife potentially going home with hospice to allow more support at home.  Requested follow-up today regarding decision for this.  Presented to bedside to meet with patient.  Multiple family members present at bedside.  Patient family noted they had just discussed care with hospitalist.  Informed plan is for patient to return home with palliative support so we can potentially follow-up in the outpatient setting with oncology for more immunotherapy. IDT coordinating patient going home with home palliative and home health support.  Review of Systems Denies symptoms of concern Objective:   Vital Signs:  BP 103/76 (BP Location: Right Arm)   Pulse (!) 134   Temp 98.2 F (36.8 C)   Resp 20   Ht 6' (1.829 m)   Wt 77 kg   SpO2 97%   BMI 23.02 kg/m   Physical Exam: General: NAD, alert, sitting in bedside chair, chronically ill appearing  Eyes: No drainage noted HENT: Dry mucous membranes Cardiovascular: Tachycardia noted Respiratory: increased work of breathing noted Skin: no rashes or lesions on visible skin Neuro: A&Ox4, following commands easily Psych: appropriately answers all questions  Imaging:  I personally reviewed recent imaging.   Assessment & Plan:   Assessment: Patient is a 67 year old male with a past medical history of stage IV  adenocarcinoma of the stomach status post J-tube placement also on TPN who was admitted on 12-24-2022 for management of worsening shortness of breath, lower extremity edema, and orthopnea.  Patient noted to have significant drainage around J-tube.  Surgery consulted and J-tube removed on 12/03/2022.  Palliative medicine team consulted to assist with complex medical decision making.  Recommendations/Plan: # Complex medical decision making/goals of care:  - Patient and wife still seeking for patient to go home with home health, home palliative, and continuing TPN/IVF at home.  TOC assisting with coordination of care.  [facility] involved with their palliative group-  [facility].  -  Code Status: Limited: Do not attempt resuscitation (DNR) -DNR-LIMITED -Do Not Intubate/DNI   # Psychosocial Support:  -Wife  # Discharge Planning: Home with Home Health and palliative services via [facility]   Discussed with: Patient, RN, hospitalist, oncologist, TOC, [facility] liaison for palliative services   Thank you for allowing the palliative care team to participate in the care Margarita Rana.  Alvester Morin, DO Palliative Care Provider PMT # [phone]  If patient remains symptomatic despite maximum doses, please call PMT at [phone] between 0700 and 1900. Outside of these hours, please call attending, as PMT does not have night coverage.  This provider spent a total of 36 minutes providing patient's care.  Includes review of EMR, discussing care with other staff members involved in patient's medical care, obtaining relevant history and information from patient and/or patient's family, and personal review of imaging and lab work. Greater than 50% of the time was spent  counseling and coordinating care related to the above assessment and plan.    *Please note that this is a verbal dictation therefore any spelling or grammatical errors are due to the "Dragon [employer] One" system  interpretation.

## 2022-12-05 NOTE — Progress Notes (Signed)
OT Cancellation Note  Patient Details Name: Steven Wolf MRN: [id number] DOB: 03/18/1956   Cancelled Treatment:    Reason Eval/Treat Not Completed: Patient at procedure or test/ unavailable Attempted OT eval this AM. Pt, family, and MD in room and pt was unable to participate. Afterwards pt was taken to procedure. Will re-attempt OT eval at a later time when pt is available.  Limmie Patricia, OTR/L,CBIS  Supplemental OT - [facility] and [facility] Secure Chat Preferred   12/05/2022, 10:08 AM

## 2022-12-05 NOTE — Procedures (Signed)
PROCEDURE SUMMARY:  Successful image-guided paracentesis from the left upper abdomen.  Yielded 1 liter of dark yellow fluid.  No immediate complications.  EBL = trace. Patient tolerated well.   Specimen was not sent for labs.  Please see imaging section of Epic for full dictation.   Kennieth Francois PA-C 12/05/2022 10:59 AM

## 2022-12-06 DIAGNOSIS — Z79899 Other long term (current) drug therapy: Secondary | ICD-10-CM

## 2022-12-06 DIAGNOSIS — Z515 Encounter for palliative care: Secondary | ICD-10-CM | POA: Diagnosis not present

## 2022-12-06 DIAGNOSIS — R0602 Shortness of breath: Secondary | ICD-10-CM | POA: Diagnosis not present

## 2022-12-06 DIAGNOSIS — Z66 Do not resuscitate: Secondary | ICD-10-CM | POA: Diagnosis not present

## 2022-12-06 DIAGNOSIS — Z7189 Other specified counseling: Secondary | ICD-10-CM | POA: Diagnosis not present

## 2022-12-06 DIAGNOSIS — C168 Malignant neoplasm of overlapping sites of stomach: Secondary | ICD-10-CM | POA: Diagnosis not present

## 2022-12-06 LAB — COMPREHENSIVE METABOLIC PANEL
ALT: 17 U/L (ref 0–44)
AST: 39 U/L (ref 15–41)
Albumin: 2.9 g/dL — ABNORMAL LOW (ref 3.5–5.0)
Alkaline Phosphatase: 144 U/L — ABNORMAL HIGH (ref 38–126)
Anion gap: 11 (ref 5–15)
BUN: 154 mg/dL — ABNORMAL HIGH (ref 8–23)
CO2: 18 mmol/L — ABNORMAL LOW (ref 22–32)
Calcium: 9.8 mg/dL (ref 8.9–10.3)
Chloride: 110 mmol/L (ref 98–111)
Creatinine, Ser: 2.27 mg/dL — ABNORMAL HIGH (ref 0.61–1.24)
GFR, Estimated: 30 mL/min — ABNORMAL LOW (ref >60)
Glucose, Bld: 178 mg/dL — ABNORMAL HIGH (ref 70–99)
Potassium: 4.7 mmol/L (ref 3.5–5.1)
Sodium: 139 mmol/L (ref 135–145)
Total Bilirubin: 1.6 mg/dL — ABNORMAL HIGH (ref 0.3–1.2)
Total Protein: 6.1 g/dL — ABNORMAL LOW (ref 6.5–8.1)

## 2022-12-06 LAB — GLUCOSE, CAPILLARY
Glucose-Capillary: 193 mg/dL — ABNORMAL HIGH (ref 70–99)
Glucose-Capillary: 169 mg/dL — ABNORMAL HIGH (ref 70–99)
Glucose-Capillary: 151 mg/dL — ABNORMAL HIGH (ref 70–99)
Glucose-Capillary: 164 mg/dL — ABNORMAL HIGH (ref 70–99)
Glucose-Capillary: 163 mg/dL — ABNORMAL HIGH (ref 70–99)
Glucose-Capillary: 195 mg/dL — ABNORMAL HIGH (ref 70–99)

## 2022-12-06 LAB — CYTOLOGY - NON PAP

## 2022-12-06 LAB — PHOSPHORUS: Phosphorus: 5.3 mg/dL — ABNORMAL HIGH (ref 2.5–4.6)

## 2022-12-06 LAB — MAGNESIUM: Magnesium: 2.6 mg/dL — ABNORMAL HIGH (ref 1.7–2.4)

## 2022-12-06 MED ORDER — TRAVASOL 10 % IV SOLN
INTRAVENOUS | Status: DC
  Filled 2022-12-06: qty 1155

## 2022-12-06 MED ORDER — HYDROMORPHONE BOLUS VIA INFUSION
0.5000 mg | INTRAVENOUS | Status: DC | PRN
  Administered 2022-12-06: 0.5 mg via INTRAVENOUS
  Filled 2022-12-06: qty 1

## 2022-12-06 MED ORDER — HYDROMORPHONE HCL-NACL 50-0.9 MG/50ML-% IV SOLN
0.5000 mg/h | INTRAVENOUS | Status: DC
  Administered 2022-12-06: 0.5 mg/h via INTRAVENOUS
  Filled 2022-12-06 (×2): qty 50

## 2022-12-06 MED ORDER — HYDROMORPHONE HCL 1 MG/ML IJ SOLN
0.5000 mg | INTRAMUSCULAR | Status: DC | PRN
  Administered 2022-12-06: 0.5 mg via INTRAVENOUS
  Filled 2022-12-06: qty 0.5

## 2022-12-06 MED ORDER — LORAZEPAM 2 MG/ML IJ SOLN: 0.5000 mg | INTRAMUSCULAR | Status: DC | PRN

## 2022-12-06 MED ORDER — HYDROMORPHONE 1 MG/ML IV SOLN: INTRAVENOUS | Status: DC

## 2022-12-06 MED ORDER — POLYVINYL ALCOHOL 1.4 % OP SOLN: 1.0000 [drp] | Freq: Four times a day (QID) | OPHTHALMIC | Status: DC | PRN

## 2022-12-06 MED ORDER — GLYCOPYRROLATE 0.2 MG/ML IJ SOLN
0.2000 mg | INTRAMUSCULAR | Status: DC | PRN
  Administered 2022-12-06 – 2022-12-07 (×3): 0.2 mg via INTRAVENOUS
  Filled 2022-12-06 (×3): qty 1

## 2022-12-06 MED ORDER — SODIUM CHLORIDE 0.9% FLUSH: 9.0000 mL | INTRAVENOUS | Status: DC | PRN

## 2022-12-06 MED ORDER — BIOTENE DRY MOUTH MT LIQD: 15.0000 mL | OROMUCOSAL | Status: DC | PRN

## 2022-12-06 MED ORDER — HALOPERIDOL LACTATE 5 MG/ML IJ SOLN: 1.0000 mg | INTRAMUSCULAR | Status: DC | PRN

## 2022-12-06 NOTE — Progress Notes (Signed)
Daily Progress Note   Patient Name: Steven Wolf       Date: 12/06/2022 DOB: 1955/04/19  Age: 67 y.o. MRN#: [id number] Attending Physician: Cathren Harsh, MD Primary Care Physician: Pincus Sanes, MD Admit Date: 12/02/2022 Length of Stay: 7 days  Reason for Consultation/Follow-up: Establishing goals of care  Subjective:   CC: Patient now barely able to speak.  Following up regarding complex medical decision making.  Subjective:  Reviewed EMR prior to presenting to bedside.  Patient's medical status has acutely deteriorated after paracentesis performed yesterday with development of hypotension requiring albumin infusion, IV fluids, and midodrine. Informed by hospitalist today that family willing to discuss comfort focused care at this time.  Presented to bedside to meet with patient.  Patient seen laying in bed.  Patient very pale and tachypneic.  Patient's wife present at bedside.  Patient's wife able to contact patient's sister and brother to present to bedside.  With patient's permission, able to discuss deterioration of his current medical status.  Discussed worry about time being short and how to optimize quality time to prevent suffering and pain.  Discussed full transition to comfort focused care including discontinuing lab work, imaging, and TPN.  Would instead start medications to focus on dyspnea, pain, nausea and vomiting, etc. at the end of life.  Discussed worry that at this time patient too unstable for transfer though if stabilizes while receiving comfort focused care, may need to transition to an inpatient hospice unit.  Patient himself agreeing with transition to full comfort focused care at this time.  Patient and family's priority is to manage pain so that he does not suffer at the end of life.  All questions answered at that time.  Noted palliative medicine team will continue to follow along with patient's medical journey.  Updated IDT about transition to full comfort  focused care.  Review of Systems Denies symptoms of concern Objective:   Vital Signs:  BP 120/83 (BP Location: Right Arm)   Pulse (!) 124   Temp (!) 97.5 F (36.4 C)   Resp 19   Ht 6' (1.829 m)   Wt 77 kg   SpO2 98%   BMI 23.02 kg/m   Physical Exam: General: Laying in bed, pale, ill appearing  Eyes: No drainage noted HENT: Dry mucous membranes Cardiovascular: Tachycardia noted Respiratory: Tachypnea noted, increased work of breathing with accessory muscles Skin: no rashes or lesions on visible skin Neuro: following commands easily, difficult for patient to speak is losing voice though appropriately nods head yes and no Psych: appropriately answers all questions  Imaging:  I personally reviewed recent imaging.   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  Patient noted to have significant drainage around J-tube.  Surgery consulted and J-tube removed on 12/03/2022.  Palliative medicine team consulted to assist with complex medical decision making.  Recommendations/Plan: # Complex medical decision making/goals of care:  - Patient's medical status has acutely worsened over past 24 hours.  Discussed with patient and family at bedside including patient's wife.  Transition to comfort focused care at this time.  Patient too unstable for transfer to inpatient hospice facility.  Did discuss with family that should patient stabilize, may need to consider referral to inpatient hospice to continue end-of-life  care there. -At this time we will discontinue interventions that are no longer focused on comfort such as IV fluids, imaging, or lab work.  Will instead focus on symptom management of pain, dyspnea, and agitation in the setting of end-of-life  care.  -  Code Status: Limited: Do not attempt resuscitation (DNR) -DNR-LIMITED -Do Not Intubate/DNI   # Symptom management    -Pain/Dyspnea, acute in the setting of end-of-life care                               -Start IV Dilaudid 0.5 mg/hr infusion with RN bolus via infusion of 0.5mg  IV dilaudid q prn.  Continue to titrate based on patient's symptom burden.  (Started at this dose as per family's request as noting patient responded well previously to 0.5mg  IV dilaudid.)                   -Anxiety/agitation, in the setting of end-of-life care                               -Start IV Ativan 0.5 mg every 4 hours as needed. Continue to adjust based on patient's symptom burden.                                 -Start IV Haldol 1 mg every 4 hours as needed. Continue to adjust based on patient's symptom burden.                   -Secretions, in the setting of end-of-life care                               -Start IV glycopyrrolate 0.2 mg every 4 hours as needed.   # Psychosocial Support:  -Wife  # Discharge Planning: To Be Determined .  Patient's medical status has acutely worsened.  Transition to full comfort focused care at this time.  Anticipate in-hospital death currently.  Should patient stabilize, could consider for for transfer to inpatient hospice unit.  Discussed with: Patient, RN, hospitalist, oncologist, TOC, patient's wife/sister/brother  Thank you for allowing the palliative care team to participate in the care Steven Wolf.  Steven Morin, DO Palliative Care Provider PMT # [phone]  If patient remains symptomatic despite maximum doses, please call PMT at [phone] between 0700 and 1900. Outside of these hours, please call attending, as PMT does not have night coverage.  *Please note that this is a verbal dictation therefore any spelling or grammatical errors are due to the "[employer]agon [employer] One" system interpretation.

## 2022-12-06 NOTE — Progress Notes (Addendum)
PHARMACY - TOTAL PARENTERAL NUTRITION CONSULT NOTE   Indication: Continuation of chronic home TPN  Patient Measurements: Height: 6' (182.9 cm) Weight: 77 kg (169 lb 12.1 oz) IBW/kg (Calculated) : 77.6 TPN AdjBW (KG): 79.1 Body mass index is 23.02 kg/m. 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 Does have J-G tube for feeding/venting, but has been unable to tolerate any jejunal feeding recently. Presents 9/6 with pleural effusions, abdominal distention and ascites, LE edema, and persistent leaking around J-tube insertion site which has worsened, and now appears purulent. 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 Noted to have stable chronic leukocytosis at baseline. Patient will be admitted for rule-out of sepsis; Pharmacy to continue TPN while admitted.  Pharmacist contacted home infusion pharmacist Jill Alexanders with [facility] [phone])  Patient Measurements: Estimated body mass index is 23.02 kg/m as calculated from the following:   Height as of this encounter: 6' (1.829 m).   Weight as of this encounter: 77 kg (169 lb 12.1 oz).  Intake/Output: Net I/O: +2122 mL/24 hrs  UOP: 0 or not charted Drains: -1200 mL/24 hrs 9/6 S/p L  thoracentesis w/ 1700 mls out 9/6 lasix 100 mg total IV x 1; 9/7 lasix 60 IV x 1 9/6 albumin 25 gm q6h x 4 doses 9/7 s/p R thora w/ 900 cc out 9/7 s/p paracentesis w/ 1700 cc out 9/12 s/p paracentesis w/ 1 L removed  Estimated Nutritional Needs - Per RD recommendations on 12/05/22 (with no changes from 9/7) kCal: 2400-2600 Protein: 110-130 grams Fluid: > 2.4 L  [facility] TPN formula:  2100 mls over 12 hours providing 2012 total Kcals, 115 gm protein Lytes per 24 hrs: Na 105: K 107; CL 110: ace 168, Phos 36.5; Ca 10, Mag 10  TPN @ 87.5 mls/hr will provide ~ 115.5 gm protein & ~ 2306 Kcals for 100% support  Recent Labs    12/05/22 0535  12/05/22 1537 12/06/22 0526  NA 140 142 139  K 4.8 4.8 4.7  CL 112* 112* 110  CO2 20* 20* 18*  GLUCOSE 201* 199* 178*  BUN 117* 135* 154*  CREATININE 1.62* 1.97* 2.27*  CALCIUM 9.7 10.0 9.8  PHOS 4.6  --  5.3*  MG 2.7*  --  2.6*  ALBUMIN 2.0*  --  2.9*  ALKPHOS 146*  --  144*  AST 29  --  39  ALT 22  --  17  BILITOT 1.0  --  1.6*   Insulin Requirements: no hx DM, no insulin in TPN PTA.  -CBGs borderline elevated 163-201 on TPN @ 87.5 ml/hr (Goal < 180) since increasing to moderate SSI 9/9 -18 units SSI/24 hrs  Lytes:  -K remains WNL from yesterday -CO2 18 (low) -Mg 2.6 (remains elevated) -Corrected Ca 10.68 (remains elevated) -Phos 4.3>>4.6 >>5.3 (elevated) Renal:   -BUN continuing to increase--at 154 today -Scr continuing to increase--at 2.27 today LFTs:  -Albumin 2.9 (low) -Alk phos 144 (slightly elevated) -TGs 133 (stable) Current Nutrition: NPO, cyclic TPN PTA 9/8 TPN 87.5 ml/hr>> IVF: none Central access: PAC  TPN start date: PTA  PLAN: At 1800 today: Continue TPN at 87.5 ml/hr. TPN @ 87.5 mls/hr will provide ~ 115.5 gm protein & ~ 2306 Kcals for 100% support Electrolytes in TPN: Standard:  Sodium 50 mEq/L Continue K 10 mEq/L  Continue Ca at 0 mEq/L  Continue Mg at 0 mEq/L  Decrease Phos to 0 mmol/L Continue Cl:Ac ratio  to maximize acetate TPN to contain standard multivitamins and trace elements. Continue moderate SSI q4h TPN lab panels on Mondays & Thursdays F/u CMP, Mg, Phos daily x 2   Thank you for allowing pharmacy to be a part of this patient's care.  Selinda Eon, PharmD, BCPS Clinical Pharmacist [facility] 12/06/2022 8:46 AM  Addendum: Transitioning to comfort care and TPN has been discontinued by provider.    Thank you for allowing pharmacy to be a part of this patient's care.  Selinda Eon, PharmD, BCPS Clinical Pharmacist [place] 12/06/2022 11:37 AM

## 2022-12-06 NOTE — TOC Progression Note (Signed)
Transition of Care [facility]) - Progression Note    Patient Details  Name: Steven Wolf MRN: [id number] Date of Birth: [birth year]  [facility] of [facility] CM/SW Contact  Amada Jupiter, LCSW Phone Number: 12/06/2022, 3:35 PM  Clinical Narrative:     Noted that pt's status is worsening and approach now on comfort care.  TOC will continue to follow along and monitor should condition change.  Expected Discharge Plan: Home w Hospice Care Barriers to Discharge: No Barriers Identified  Expected Discharge Plan and Services     Post Acute Care Choice: Hospice Living arrangements for the past 2 months: Single Family Home                                       Social Determinants of Health (SDOH) Interventions SDOH Screenings   Food Insecurity: No Food Insecurity (12/04/2022)  Housing: Low Risk  (12/04/2022)  Transportation Needs: No Transportation Needs (12/04/2022)  Utilities: Not At Risk (12/04/2022)  Depression (PHQ2-9): Medium Risk (08/13/2022)  Financial Resource Strain: Low Risk  (08/12/2022)  Physical Activity: Sufficiently Active (08/12/2022)  Social Connections: Unknown (08/12/2022)  Stress: Stress Concern Present (08/12/2022)  Tobacco Use: Low Risk  (12/01/2022)    Readmission Risk Interventions    10/30/2022    2:51 PM  Readmission Risk Prevention Plan  Transportation Screening Complete  PCP or Specialist Appt within 5-7 Days Complete  Home Care Screening Complete  Medication Review (RN CM) Referral to Pharmacy

## 2022-12-06 NOTE — Plan of Care (Signed)

## 2022-12-06 NOTE — Progress Notes (Signed)
OT Cancellation Note  Patient Details Name: KASS WEISHEIT MRN: [id number] DOB: 04-17-1955   Cancelled Treatment:    Reason Eval/Treat Not Completed: Medical issues which prohibited therapy. Pt now with worsening medical state; wife reports pt is now actively transitioning to end of life. Pt now full comfort care. OT will sign off.     Reuben Likes, OTR/L 12/06/2022, 2:51 PM

## 2022-12-06 NOTE — Progress Notes (Signed)
Oncology brief note   Patient's dyspnea and tachypnea got worse this morning, and he became more lethargic.  Drs. Rai and Mims have seen him this morning and transitioned him to total comfort care, which I completely agree.  I stop by, and I spoke with his brother Steven Wolf.  I answered his questions.  I anticipated patient will probably die in the next few days in hospital, but if he became stable, residential hospice is very appropriate.  [name] MD 12/06/2022

## 2022-12-06 NOTE — Progress Notes (Signed)
[facility]                                                                              Steven Wolf, is a 67 y.o. 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 Recent PET scan 11/28/2022 showed new findings of hypermetabolic nodule metathesis and probably progressive peritoneal carcinomatosis, increased ascites although diabetes mellitus could have similar finding.  He was also noted to have bilateral new pleural effusions which was worse on the left. Chest x-ray showed pleural effusion, underwent thoracentesis 1.7 L from left and 0.9L from right Surgery and wound care consulted Surgery did not feel he needs a surgical intervention at this time and recommended palliative goals of care. Pleural fluid appeared exudative, PCCM signed off on 9/9 Seen by Dr. 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 Mosetta Putt is following and patient planning palliative care at home with few more doses of immunotherapy -Goals of care addressed with patient's wife,  sister and family at the bedside, comfort care status     Shortness of breath, pleural effusion bilateral Peripheral edema, ascites Hypoalbuminemia with third spacing: - COVID RSV influenza panel negative  - Thoracentesis by CCM on 15-Dec-2022 removed 1.7 L from left and 0.9 from right, received IV albumin and Lasix - Cultures negative, cytology pending, currently on IV cefepime, Flagyl -Had undergone paracentesis on 9/7, 1.7 L removed - TTE, LV grossly normal function - LE duplex negative for DVT -Patient noted to be somewhat more short of breath, distended abdomen today -Chest x-ray showed new bilateral pleural effusions and/or infiltrates in both lower lobes, L>R -S/p paracentesis on 9/12, 1 L removed.  Received IV albumin, fluid bolus and midodrine for hypotension postprocedure -On examination this morning, patient noted to have labored breathing, somewhat somnolent but easily arousable and follows commands. -Discussed in detail with the patient's family members at the bedside, advised against thoracentesis as clinical status is deteriorating, worsening renal function, 2.27, borderline BP.  Family then requested to focus on comfort and not prolong any suffering.  No further escalation of care. Patient is too unstable to transfer to residential hospice or discharge home with hospice at this point. -Requested palliative medicine assistance for end-of-life care/comfort care.     Suspected sepsis initially  Leukocytosis  -Afebrile, procalcitonin borderline 0.6. -Initially concern for peritonitis and possible infection around J-tube received empiric cefepime and Flagyl. -Pleural fluid culture showed no growth.  UA unremarkable. -Paracentesis completed, 1.7 L fluid removed, negative for SBP on 9/7, s/p paracentesis on 9/12, 1 L removed    Normocytic Anemia, Anemia of chronic disease: H&H stable    Hypokalemia -Stable   Oral Thrush Cont nystatin   T2DM: Blood sugar stable  Acute kidney  injury -Creatinine slowly trending up-> 1.6 -Started on midodrine 10 mg 3 times daily, IV albumin  Generalized deconditioning, debility -PT OT evaluation as requested by family  Estimated body mass index is 23.02 kg/m as calculated from the following:   Height as of this encounter: 6' (1.829 m).   Weight as of this encounter: 77 kg.  Code Status: DNR DVT Prophylaxis:     Level of Care: Level of care: Med-Surg Family Communication: Updated patient's multiple family members including sister, wife, brother and another family member at the bedside. Disposition Plan:      Remains inpatient appropriate:   Comfort care, disposition TBD   Procedures:    Consultants:   Oncology General Surgery Palliative medicine  Antimicrobials:   Anti-infectives (From admission, onward)    Start     Dose/Rate Route Frequency Ordered Stop   12/05/22 0200  ceFEPIme (MAXIPIME) 2 g in sodium chloride 0.9 % 100 mL IVPB  Status:  Discontinued        2 g 200 mL/hr over 30 Minutes Intravenous Every 12 hours 12/04/22 1438 12/06/22 1133   12/03/22 1307  ceFEPIme (MAXIPIME) 2 g in sodium chloride 0.9 % 100 mL IVPB  Status:  Discontinued        2 g 200 mL/hr over 30 Minutes Intravenous Every 8 hours 12/03/22 1307 12/04/22 1438   12/03/22 1307  metroNIDAZOLE (FLAGYL) IVPB 500 mg  Status:  Discontinued        500 mg 100 mL/hr over 60 Minutes Intravenous Every 12 hours 12/03/22 1307 12/06/22 1133   11/25/2022 2200  metroNIDAZOLE (FLAGYL) IVPB 500 mg  Status:  Discontinued        500 mg 100 mL/hr over 60 Minutes Intravenous Every 12 hours 12/18/2022 1404 12/03/22 0838   11/24/2022 2200  ceFEPIme (MAXIPIME) 2 g in sodium chloride 0.9 % 100 mL IVPB  Status:  Discontinued        2 g 200 mL/hr over 30 Minutes Intravenous Every 8 hours 12/15/2022 1418 12/03/22 0838   11/30/2022 2200  vancomycin (VANCOREADY) IVPB 1250 mg/250 mL  Status:  Discontinued        1,250 mg 166.7 mL/hr over 90 Minutes Intravenous Every 12 hours  12/20/2022 1418 11/30/22 0956   11/26/2022 1230  ceFEPIme (MAXIPIME) 2 g in sodium chloride 0.9 % 100 mL IVPB        2 g 200 mL/hr over 30 Minutes Intravenous  Once 11/28/2022 1224 12/19/2022 1327   12/21/2022 1230  metroNIDAZOLE (FLAGYL) IVPB 500 mg        500 mg 100 mL/hr over 60 Minutes Intravenous  Once 12/03/2022 1224 12/11/2022 1401   12/20/2022 1230  vancomycin (VANCOCIN) IVPB 1000 mg/200 mL premix  Status:  Discontinued        1,000 mg 200 mL/hr over 60 Minutes Intravenous  Once 12/12/2022 1224 12/23/2022 1228   11/28/2022 1230  vancomycin (VANCOREADY) IVPB 1500 mg/300 mL        1,500 mg 150 mL/hr over 120 Minutes Intravenous  Once 12/17/2022 1228 11/25/2022  1600          Medications  Chlorhexidine Gluconate Cloth  6 each Topical Daily   nystatin  5 mL Oral QID   mouth rinse  15 mL Mouth Rinse 4 times per day   sodium chloride flush  3 mL Intravenous Q12H      Subjective:   Steven Wolf was seen and examined today.  Not doing too well today, noted to be lethargic, although arousable, having labored breathing.   Objective:   Vitals:   12/06/22 0328 12/06/22 0426 12/06/22 0827 12/06/22 1210  BP: 115/78 120/83 133/76 95/70  Pulse: (!) 123 (!) 124 (!) 125 (!) 118  Resp: 20 19 20 18   Temp: 97.9 F (36.6 C) (!) 97.5 F (36.4 C) 98.2 F (36.8 C) 97.9 F (36.6 C)  TempSrc:   Oral Axillary  SpO2: 98% 98% 96% 97%  Weight:      Height:        Intake/Output Summary (Last 24 hours) at 12/06/2022 1542 Last data filed at 12/06/2022 0900 Gross per 24 hour  Intake 2422.16 ml  Output 1200 ml  Net 1222.16 ml     Wt Readings from Last 3 Encounters:  12/04/22 77 kg  11/21/22 80.5 kg  11/18/22 76.6 kg    Physical Exam General: Somnolent but arousable Cardiovascular: S1 S2 clear, RRR.  Respiratory: Diminished breath sound at the bases Gastrointestinal: Soft, distended, NBS, prior G-tube site with Eakin's pouch Ext: no pedal edema bilaterally Neuro: follows commands Psych:  lethargic    Data Reviewed:  I have personally reviewed following labs    CBC Lab Results  Component Value Date   WBC 18.7 (H) 12/01/2022   RBC 3.51 (L) 12/01/2022   HGB 10.1 (L) 12/01/2022   HCT 31.2 (L) 12/01/2022   MCV 88.9 12/01/2022   MCH 28.8 12/01/2022   PLT 303 12/01/2022   MCHC 32.4 12/01/2022   RDW 14.2 12/01/2022   LYMPHSABS 0.3 (L) 12/01/2022   MONOABS 1.4 (H) 12/01/2022   EOSABS 0.2 12/01/2022   BASOSABS 0.1 12/01/2022     Last metabolic panel Lab Results  Component Value Date   NA 139 12/06/2022   K 4.7 12/06/2022   CL 110 12/06/2022   CO2 18 (L) 12/06/2022   BUN 154 (H) 12/06/2022   CREATININE 2.27 (H) 12/06/2022   GLUCOSE 178 (H) 12/06/2022   GFRNONAA 30 (L) 12/06/2022   GFRAA 73 12/13/2019   CALCIUM 9.8 12/06/2022   PHOS 5.3 (H) 12/06/2022   PROT 6.1 (L) 12/06/2022   ALBUMIN 2.9 (L) 12/06/2022   BILITOT 1.6 (H) 12/06/2022   ALKPHOS 144 (H) 12/06/2022   AST 39 12/06/2022   ALT 17 12/06/2022   ANIONGAP 11 12/06/2022    CBG (last 3)  Recent Labs    12/06/22 0324 12/06/22 0748 12/06/22 1207  GLUCAP 164* 163* 195*      Coagulation Profile: No results for input(s): "INR", "PROTIME" in the last 168 hours.    Radiology Studies: I have personally reviewed the imaging studies  US Paracentesis  Result Date: 12/05/2022 INDICATION: Metastatic gastric cancer and peritoneal carcinomatosis with ascites. Request received for therapeutic paracentesis. EXAM: ULTRASOUND GUIDED  PARACENTESIS MEDICATIONS: 6 cc 1% lidocaine COMPLICATIONS: None immediate. PROCEDURE: Informed written consent was obtained from the patient after a discussion of the risks, benefits and alternatives to treatment. A timeout was performed prior to the initiation of the procedure. Initial ultrasound scanning demonstrates a moderate amount of ascites within the left upper abdominal quadrant. The  left upper abdomen was prepped and draped in the usual sterile fashion. 1% lidocaine was  used for local anesthesia. Following this, a 19 gauge, 7-cm, Yueh catheter was introduced. An ultrasound image was saved for documentation purposes. The paracentesis was performed. The catheter was removed and a dressing was applied. The patient tolerated the procedure well without immediate post procedural complication. FINDINGS: A total of approximately 1 L of yellow fluid was removed. Ordering provider did not request laboratory samples. IMPRESSION: Successful ultrasound-guided paracentesis yielding 1 liter of peritoneal fluid. Procedure performed by Mina Marble, PA-C Electronically Signed   By: Marliss Coots M.D.   On: 12/05/2022 12:11       Chavela Justiniano M.D. [facility] 12/06/2022, 3:42 PM  Available via Epic secure chat 7am-7pm After 7 pm, please refer to night coverage provider listed on amion.

## 2022-12-06 NOTE — Plan of Care (Signed)
  Interdisciplinary Goals of Care Family Meeting   Date carried out: 12/06/2022  Location of the meeting: Bedside  Member's involved: physician (myself), patient's wife Steven Wolf), sister Steven Wolf), brother    Durable Power of Pensions consultant or Environmental health practitioner: family     Discussion: We discussed goals of care for  Mr. Steven Wolf   We discussed diagnoses, prognosis, GOC, EOL wishes disposition and options.   Discussed regarding advanced directives in detail.  Concepts specific to code status and difference between an aggressive medical intervention path and a comfort care path was discussed.   Discussed limitations of medical interventions to prolong quality of life in some situations. Patient is having labored breathing, much worse than yesterday, abdominal distension, worsening renal function. Family is respectful of his wishes of DNR/DNI, goal is not to suffer and keeping him comfortable.    Code status: DNR/ DNI, no escalation of care  Disposition: TBD  Time spent for the meeting: 25 mins  Tewana Bohlen, MD 12/06/2022, 10:46 AM

## 2022-12-06 NOTE — Consult Note (Addendum)
WOC NURSE FOLLOW-UP NOTE:   Patient continues to have copious brown drainage from J tube site LMQ;  Eakin pouch change today in anticipation of discharge this weekend.    Removed old Eakin, skin around area is greatly improved, mild erythema, no open weeping areas.  No sting barrier wipes used to area after cleaning and shaving some hair from area.  Small Eakin [place] [id number]) placed and 2 additional small Eakins ordered for home.  Sister is a retired Engineer, civil (consulting) and I did demonstrate to her how to perform Eakin pouch change and hook to bedside drainage bag. Did empty 800 mls dark thick brown effluent from old drainage bag.   WOC team will continue to follow while remains inpatient.   Thank you,    Priscella Mann MSN, RN-BC, [employer] [phone]

## 2022-12-09 LAB — GLUCOSE, CAPILLARY: Glucose-Capillary: 108 mg/dL — ABNORMAL HIGH (ref 70–99)

## 2022-12-10 ENCOUNTER — Ambulatory Visit: Payer: Medicare PPO | Admitting: Hematology

## 2022-12-10 ENCOUNTER — Other Ambulatory Visit: Payer: Medicare PPO

## 2022-12-10 ENCOUNTER — Ambulatory Visit: Payer: Medicare PPO

## 2022-12-11 ENCOUNTER — Ambulatory Visit: Payer: Medicare PPO | Admitting: Hematology

## 2022-12-11 ENCOUNTER — Other Ambulatory Visit: Payer: Medicare PPO

## 2022-12-11 ENCOUNTER — Ambulatory Visit: Payer: Medicare PPO

## 2022-12-19 ENCOUNTER — Other Ambulatory Visit: Payer: Medicare PPO

## 2022-12-19 ENCOUNTER — Ambulatory Visit: Payer: Medicare PPO

## 2022-12-19 ENCOUNTER — Ambulatory Visit: Payer: Medicare PPO | Admitting: Hematology

## 2022-12-19 ENCOUNTER — Encounter: Payer: Medicare PPO | Admitting: Nutrition

## 2022-12-24 NOTE — Progress Notes (Signed)
Patient Name: Steven Wolf           DOB: [birth year]  MRN: [id number]       OVERNIGHT EVENT    Notified by RN that patient has expired at 0135.  2 RN verified. Patient was comfort care.   Family is at bedside.    Anthoney Harada, DNP, ACNPC- AG [facility]

## 2022-12-24 NOTE — Death Summary Note (Signed)
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 Possible infection around his J-tube leakage J-tube 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 Bilateral pleural effusion  Hypoalbuminemia with third spacing  Malignant Ascites  Normocytic anemia  Diabetes mellitus type 2 Acute kidney injury  Generalized debility    Hospital Course: Patient was a 67 year old male with HLP, HTN, BPH, DM, OSA on CPAP, 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 Recent PET scan 11/28/2022 showed new findings of hypermetabolic nodule metathesis and probably progressive peritoneal carcinomatosis, increased ascites although diabetes mellitus could have similar finding.  He was also noted to have bilateral new pleural effusions which was worse on the left. Chest x-ray showed pleural effusion, underwent thoracentesis 1.7 L from left and 0.9L from right Surgery and wound care consulted Surgery did not feel he needed a surgical intervention at this time and recommended palliative goals of care.    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  -Goals of care were addressed and family requested comfort care status. Patient was followed closely by palliative medicine.        Shortness of breath, pleural effusion bilateral Peripheral edema, ascites Hypoalbuminemia with third spacing: - COVID RSV influenza panel was negative  - Thoracentesis by CCM on 11/25/2022 removed 1.7 L from left and 0.9 from right, received IV albumin and Lasix. Had undergone paracentesis on 9/7, 1.7 L removed - TTE with grossly normal LV function - LE duplex negative for DVT -Patient noted to be somewhat more short of breath, distended abdomen on 9/12, underwent paracentesis on 9/12, 1 L removed.  Received IV albumin, fluid bolus and midodrine for hypotension postprocedure -On 9/13, patient noted to have labored breathing, somnolent, deterioating clinical status with worsening renal function, 2.27, borderline BP.  Family then requested to focus on comfort and not prolong any suffering.  No further escalation of care.   Suspected sepsis initially Leukocytosis  -Afebrile, procalcitonin borderline 0.6. Initially concern for peritonitis and possible infection around J-tube, hence received empiric cefepime and Flagyl. -Pleural fluid culture showed no growth.  UA unremarkable. -Paracentesis completed, 1.7 L fluid removed, negative for SBP on 9/7, s/p paracentesis on 9/12, 1 L removed     Normocytic Anemia, Anemia of chronic disease: H&H stable     Oral Thrush Received nystatin   T2DM: Blood sugar stable   Acute kidney injury -Creatinine slowly trended up, 2.27 -Patient was started on midodrine 10 mg 3 times daily, IV albumin   Generalized deconditioning, debility Estimated body mass index 23.02 kg/m as calculated from the following:  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 Possible infection around his J-tube leakage J-tube 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 Bilateral pleural effusion  Hypoalbuminemia with third spacing  Malignant Ascites  Normocytic anemia  Diabetes mellitus type 2 Acute kidney injury  Generalized debility    Hospital Course: Patient was a 67 year old male with HLP, HTN, BPH, DM, OSA on CPAP, 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 Recent PET scan 11/28/2022 showed new findings of hypermetabolic nodule metathesis and probably progressive peritoneal carcinomatosis, increased ascites although diabetes mellitus could have similar finding.  He was also noted to have bilateral new pleural effusions which was worse on the left. Chest x-ray showed pleural effusion, underwent thoracentesis 1.7 L from left and 0.9L from right Surgery and wound care consulted Surgery did not feel he needed a surgical intervention at this time and recommended palliative goals of care.    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  -Goals of care were addressed and family requested comfort care status. Patient was followed closely by palliative medicine.        Shortness of breath, pleural effusion bilateral Peripheral edema, ascites Hypoalbuminemia with third spacing: - COVID RSV influenza panel was negative  - Thoracentesis by CCM on 11/25/2022 removed 1.7 L from left and 0.9 from right, received IV albumin and Lasix. Had undergone paracentesis on 9/7, 1.7 L removed - TTE with grossly normal LV function - LE duplex negative for DVT -Patient noted to be somewhat more short of breath, distended abdomen on 9/12, underwent paracentesis on 9/12, 1 L removed.  Received IV albumin, fluid bolus and midodrine for hypotension postprocedure -On 9/13, patient noted to have labored breathing, somnolent, deterioating clinical status with worsening renal function, 2.27, borderline BP.  Family then requested to focus on comfort and not prolong any suffering.  No further escalation of care.   Suspected sepsis initially Leukocytosis  -Afebrile, procalcitonin borderline 0.6. Initially concern for peritonitis and possible infection around J-tube, hence received empiric cefepime and Flagyl. -Pleural fluid culture showed no growth.  UA unremarkable. -Paracentesis completed, 1.7 L fluid removed, negative for SBP on 9/7, s/p paracentesis on 9/12, 1 L removed     Normocytic Anemia, Anemia of chronic disease: H&H stable     Oral Thrush Received nystatin   T2DM: Blood sugar stable   Acute kidney injury -Creatinine slowly trended up, 2.27 -Patient was started on midodrine 10 mg 3 times daily, IV albumin   Generalized deconditioning, debility Estimated body mass index 23.02 kg/m as calculated from the following:  Height as of this encounter: 6' (1.829 m).   Weight as of this encounter: 77 kg.  Patient passed on 12-18-22 at 1:35 AM.        Consultations:  Oncology General Surgery Palliative medicine  The results of significant diagnostics from this hospitalization (including imaging, microbiology, ancillary and laboratory) are listed below for reference.   Significant Diagnostic Studies: US Paracentesis  Result Date: 12/05/2022 INDICATION: Metastatic gastric cancer and peritoneal carcinomatosis with ascites. Request received for therapeutic paracentesis. EXAM: ULTRASOUND GUIDED  PARACENTESIS MEDICATIONS: 6 cc 1% lidocaine COMPLICATIONS: None immediate. PROCEDURE: Informed written consent was obtained from the patient after a discussion of the risks, benefits and alternatives to treatment. A timeout was performed prior to the initiation of the procedure. Initial ultrasound scanning demonstrates a moderate amount of ascites within the left upper abdominal quadrant. The left upper abdomen was prepped and draped in the usual sterile fashion. 1% lidocaine was used for local anesthesia. Following this, a 19 gauge, 7-cm, Yueh catheter was introduced. An ultrasound image was saved for documentation purposes. The paracentesis was performed. The catheter was removed and a dressing was applied. The patient tolerated the procedure well without immediate post procedural complication. FINDINGS: A total of approximately 1 L of yellow fluid was removed. Ordering provider did not request laboratory samples. IMPRESSION: Successful ultrasound-guided paracentesis yielding 1 liter of peritoneal fluid. Procedure performed by Mina Marble, PA-C Electronically Signed   By: Marliss Coots M.D.   On: 12/05/2022 12:11   DG Chest Port 1 View  Result Date: 12/04/2022 CLINICAL DATA:  Shortness of breath EXAM: PORTABLE CHEST 1 VIEW COMPARISON:  11/30/2022 FINDINGS: Power port in place from a left-sided approach with the tip at the SVC  RA junction. The patient has developed bilateral pleural effusions with atelectasis and or infiltrate in both lower lobes. The upper lungs are clear. IMPRESSION: New bilateral pleural effusions with atelectasis and or infiltrate in both lower lobes. Electronically Signed   By: Paulina Fusi M.D.   On: 12/04/2022 16:37   VAS [place] LOWER EXTREMITY VENOUS (DVT)  Result Date: 12/02/2022  Lower Venous DVT Study Patient Name:  Steven Wolf  Date of Exam:   12/01/2022 Medical Rec #: [id number]          Accession #:    [id number] Date of Birth: 1955-08-23           Patient Gender: M Patient Age:   14 years Exam Location:  [facility] Procedure:      VAS [place] LOWER EXTREMITY VENOUS (DVT) Referring Phys: Marguerita Merles --------------------------------------------------------------------------------  Indications: Swelling, and Edema.  Risk Factors: Surgery 11/22/22. Comparison Study: No prior study Performing Technologist: Shona Simpson  Examination Guidelines: A complete evaluation includes B-mode imaging, spectral Doppler, color Doppler, and power Doppler as needed of all accessible portions of each vessel. Bilateral testing is considered an integral part of a complete examination. Limited examinations for reoccurring indications may be performed as noted. The reflux portion of the exam is performed with the patient in reverse Trendelenburg.  +---------+---------------+---------+-----------+----------+--------------+ RIGHT    CompressibilityPhasicitySpontaneityPropertiesThrombus Aging +---------+---------------+---------+-----------+----------+--------------+ CFV      Full           Yes      Yes                                 +---------+---------------+---------+-----------+----------+--------------+ SFJ      Full                                                        +---------+---------------+---------+-----------+----------+--------------+  Height as of this encounter: 6' (1.829 m).   Weight as of this encounter: 77 kg.  Patient passed on 12-18-22 at 1:35 AM.        Consultations:  Oncology General Surgery Palliative medicine  The results of significant diagnostics from this hospitalization (including imaging, microbiology, ancillary and laboratory) are listed below for reference.   Significant Diagnostic Studies: US Paracentesis  Result Date: 12/05/2022 INDICATION: Metastatic gastric cancer and peritoneal carcinomatosis with ascites. Request received for therapeutic paracentesis. EXAM: ULTRASOUND GUIDED  PARACENTESIS MEDICATIONS: 6 cc 1% lidocaine COMPLICATIONS: None immediate. PROCEDURE: Informed written consent was obtained from the patient after a discussion of the risks, benefits and alternatives to treatment. A timeout was performed prior to the initiation of the procedure. Initial ultrasound scanning demonstrates a moderate amount of ascites within the left upper abdominal quadrant. The left upper abdomen was prepped and draped in the usual sterile fashion. 1% lidocaine was used for local anesthesia. Following this, a 19 gauge, 7-cm, Yueh catheter was introduced. An ultrasound image was saved for documentation purposes. The paracentesis was performed. The catheter was removed and a dressing was applied. The patient tolerated the procedure well without immediate post procedural complication. FINDINGS: A total of approximately 1 L of yellow fluid was removed. Ordering provider did not request laboratory samples. IMPRESSION: Successful ultrasound-guided paracentesis yielding 1 liter of peritoneal fluid. Procedure performed by Mina Marble, PA-C Electronically Signed   By: Marliss Coots M.D.   On: 12/05/2022 12:11   DG Chest Port 1 View  Result Date: 12/04/2022 CLINICAL DATA:  Shortness of breath EXAM: PORTABLE CHEST 1 VIEW COMPARISON:  11/30/2022 FINDINGS: Power port in place from a left-sided approach with the tip at the SVC  RA junction. The patient has developed bilateral pleural effusions with atelectasis and or infiltrate in both lower lobes. The upper lungs are clear. IMPRESSION: New bilateral pleural effusions with atelectasis and or infiltrate in both lower lobes. Electronically Signed   By: Paulina Fusi M.D.   On: 12/04/2022 16:37   VAS [place] LOWER EXTREMITY VENOUS (DVT)  Result Date: 12/02/2022  Lower Venous DVT Study Patient Name:  Steven Wolf  Date of Exam:   12/01/2022 Medical Rec #: [id number]          Accession #:    [id number] Date of Birth: 1955-08-23           Patient Gender: M Patient Age:   14 years Exam Location:  [facility] Procedure:      VAS [place] LOWER EXTREMITY VENOUS (DVT) Referring Phys: Marguerita Merles --------------------------------------------------------------------------------  Indications: Swelling, and Edema.  Risk Factors: Surgery 11/22/22. Comparison Study: No prior study Performing Technologist: Shona Simpson  Examination Guidelines: A complete evaluation includes B-mode imaging, spectral Doppler, color Doppler, and power Doppler as needed of all accessible portions of each vessel. Bilateral testing is considered an integral part of a complete examination. Limited examinations for reoccurring indications may be performed as noted. The reflux portion of the exam is performed with the patient in reverse Trendelenburg.  +---------+---------------+---------+-----------+----------+--------------+ RIGHT    CompressibilityPhasicitySpontaneityPropertiesThrombus Aging +---------+---------------+---------+-----------+----------+--------------+ CFV      Full           Yes      Yes                                 +---------+---------------+---------+-----------+----------+--------------+ SFJ      Full                                                        +---------+---------------+---------+-----------+----------+--------------+  Height as of this encounter: 6' (1.829 m).   Weight as of this encounter: 77 kg.  Patient passed on 12-18-22 at 1:35 AM.        Consultations:  Oncology General Surgery Palliative medicine  The results of significant diagnostics from this hospitalization (including imaging, microbiology, ancillary and laboratory) are listed below for reference.   Significant Diagnostic Studies: US Paracentesis  Result Date: 12/05/2022 INDICATION: Metastatic gastric cancer and peritoneal carcinomatosis with ascites. Request received for therapeutic paracentesis. EXAM: ULTRASOUND GUIDED  PARACENTESIS MEDICATIONS: 6 cc 1% lidocaine COMPLICATIONS: None immediate. PROCEDURE: Informed written consent was obtained from the patient after a discussion of the risks, benefits and alternatives to treatment. A timeout was performed prior to the initiation of the procedure. Initial ultrasound scanning demonstrates a moderate amount of ascites within the left upper abdominal quadrant. The left upper abdomen was prepped and draped in the usual sterile fashion. 1% lidocaine was used for local anesthesia. Following this, a 19 gauge, 7-cm, Yueh catheter was introduced. An ultrasound image was saved for documentation purposes. The paracentesis was performed. The catheter was removed and a dressing was applied. The patient tolerated the procedure well without immediate post procedural complication. FINDINGS: A total of approximately 1 L of yellow fluid was removed. Ordering provider did not request laboratory samples. IMPRESSION: Successful ultrasound-guided paracentesis yielding 1 liter of peritoneal fluid. Procedure performed by Mina Marble, PA-C Electronically Signed   By: Marliss Coots M.D.   On: 12/05/2022 12:11   DG Chest Port 1 View  Result Date: 12/04/2022 CLINICAL DATA:  Shortness of breath EXAM: PORTABLE CHEST 1 VIEW COMPARISON:  11/30/2022 FINDINGS: Power port in place from a left-sided approach with the tip at the SVC  RA junction. The patient has developed bilateral pleural effusions with atelectasis and or infiltrate in both lower lobes. The upper lungs are clear. IMPRESSION: New bilateral pleural effusions with atelectasis and or infiltrate in both lower lobes. Electronically Signed   By: Paulina Fusi M.D.   On: 12/04/2022 16:37   VAS [place] LOWER EXTREMITY VENOUS (DVT)  Result Date: 12/02/2022  Lower Venous DVT Study Patient Name:  Steven Wolf  Date of Exam:   12/01/2022 Medical Rec #: [id number]          Accession #:    [id number] Date of Birth: 1955-08-23           Patient Gender: M Patient Age:   14 years Exam Location:  [facility] Procedure:      VAS [place] LOWER EXTREMITY VENOUS (DVT) Referring Phys: Marguerita Merles --------------------------------------------------------------------------------  Indications: Swelling, and Edema.  Risk Factors: Surgery 11/22/22. Comparison Study: No prior study Performing Technologist: Shona Simpson  Examination Guidelines: A complete evaluation includes B-mode imaging, spectral Doppler, color Doppler, and power Doppler as needed of all accessible portions of each vessel. Bilateral testing is considered an integral part of a complete examination. Limited examinations for reoccurring indications may be performed as noted. The reflux portion of the exam is performed with the patient in reverse Trendelenburg.  +---------+---------------+---------+-----------+----------+--------------+ RIGHT    CompressibilityPhasicitySpontaneityPropertiesThrombus Aging +---------+---------------+---------+-----------+----------+--------------+ CFV      Full           Yes      Yes                                 +---------+---------------+---------+-----------+----------+--------------+ SFJ      Full                                                        +---------+---------------+---------+-----------+----------+--------------+  Height as of this encounter: 6' (1.829 m).   Weight as of this encounter: 77 kg.  Patient passed on 12-18-22 at 1:35 AM.        Consultations:  Oncology General Surgery Palliative medicine  The results of significant diagnostics from this hospitalization (including imaging, microbiology, ancillary and laboratory) are listed below for reference.   Significant Diagnostic Studies: US Paracentesis  Result Date: 12/05/2022 INDICATION: Metastatic gastric cancer and peritoneal carcinomatosis with ascites. Request received for therapeutic paracentesis. EXAM: ULTRASOUND GUIDED  PARACENTESIS MEDICATIONS: 6 cc 1% lidocaine COMPLICATIONS: None immediate. PROCEDURE: Informed written consent was obtained from the patient after a discussion of the risks, benefits and alternatives to treatment. A timeout was performed prior to the initiation of the procedure. Initial ultrasound scanning demonstrates a moderate amount of ascites within the left upper abdominal quadrant. The left upper abdomen was prepped and draped in the usual sterile fashion. 1% lidocaine was used for local anesthesia. Following this, a 19 gauge, 7-cm, Yueh catheter was introduced. An ultrasound image was saved for documentation purposes. The paracentesis was performed. The catheter was removed and a dressing was applied. The patient tolerated the procedure well without immediate post procedural complication. FINDINGS: A total of approximately 1 L of yellow fluid was removed. Ordering provider did not request laboratory samples. IMPRESSION: Successful ultrasound-guided paracentesis yielding 1 liter of peritoneal fluid. Procedure performed by Mina Marble, PA-C Electronically Signed   By: Marliss Coots M.D.   On: 12/05/2022 12:11   DG Chest Port 1 View  Result Date: 12/04/2022 CLINICAL DATA:  Shortness of breath EXAM: PORTABLE CHEST 1 VIEW COMPARISON:  11/30/2022 FINDINGS: Power port in place from a left-sided approach with the tip at the SVC  RA junction. The patient has developed bilateral pleural effusions with atelectasis and or infiltrate in both lower lobes. The upper lungs are clear. IMPRESSION: New bilateral pleural effusions with atelectasis and or infiltrate in both lower lobes. Electronically Signed   By: Paulina Fusi M.D.   On: 12/04/2022 16:37   VAS [place] LOWER EXTREMITY VENOUS (DVT)  Result Date: 12/02/2022  Lower Venous DVT Study Patient Name:  Steven Wolf  Date of Exam:   12/01/2022 Medical Rec #: [id number]          Accession #:    [id number] Date of Birth: 1955-08-23           Patient Gender: M Patient Age:   14 years Exam Location:  [facility] Procedure:      VAS [place] LOWER EXTREMITY VENOUS (DVT) Referring Phys: Marguerita Merles --------------------------------------------------------------------------------  Indications: Swelling, and Edema.  Risk Factors: Surgery 11/22/22. Comparison Study: No prior study Performing Technologist: Shona Simpson  Examination Guidelines: A complete evaluation includes B-mode imaging, spectral Doppler, color Doppler, and power Doppler as needed of all accessible portions of each vessel. Bilateral testing is considered an integral part of a complete examination. Limited examinations for reoccurring indications may be performed as noted. The reflux portion of the exam is performed with the patient in reverse Trendelenburg.  +---------+---------------+---------+-----------+----------+--------------+ RIGHT    CompressibilityPhasicitySpontaneityPropertiesThrombus Aging +---------+---------------+---------+-----------+----------+--------------+ CFV      Full           Yes      Yes                                 +---------+---------------+---------+-----------+----------+--------------+ SFJ      Full                                                        +---------+---------------+---------+-----------+----------+--------------+  Height as of this encounter: 6' (1.829 m).   Weight as of this encounter: 77 kg.  Patient passed on 12-18-22 at 1:35 AM.        Consultations:  Oncology General Surgery Palliative medicine  The results of significant diagnostics from this hospitalization (including imaging, microbiology, ancillary and laboratory) are listed below for reference.   Significant Diagnostic Studies: US Paracentesis  Result Date: 12/05/2022 INDICATION: Metastatic gastric cancer and peritoneal carcinomatosis with ascites. Request received for therapeutic paracentesis. EXAM: ULTRASOUND GUIDED  PARACENTESIS MEDICATIONS: 6 cc 1% lidocaine COMPLICATIONS: None immediate. PROCEDURE: Informed written consent was obtained from the patient after a discussion of the risks, benefits and alternatives to treatment. A timeout was performed prior to the initiation of the procedure. Initial ultrasound scanning demonstrates a moderate amount of ascites within the left upper abdominal quadrant. The left upper abdomen was prepped and draped in the usual sterile fashion. 1% lidocaine was used for local anesthesia. Following this, a 19 gauge, 7-cm, Yueh catheter was introduced. An ultrasound image was saved for documentation purposes. The paracentesis was performed. The catheter was removed and a dressing was applied. The patient tolerated the procedure well without immediate post procedural complication. FINDINGS: A total of approximately 1 L of yellow fluid was removed. Ordering provider did not request laboratory samples. IMPRESSION: Successful ultrasound-guided paracentesis yielding 1 liter of peritoneal fluid. Procedure performed by Mina Marble, PA-C Electronically Signed   By: Marliss Coots M.D.   On: 12/05/2022 12:11   DG Chest Port 1 View  Result Date: 12/04/2022 CLINICAL DATA:  Shortness of breath EXAM: PORTABLE CHEST 1 VIEW COMPARISON:  11/30/2022 FINDINGS: Power port in place from a left-sided approach with the tip at the SVC  RA junction. The patient has developed bilateral pleural effusions with atelectasis and or infiltrate in both lower lobes. The upper lungs are clear. IMPRESSION: New bilateral pleural effusions with atelectasis and or infiltrate in both lower lobes. Electronically Signed   By: Paulina Fusi M.D.   On: 12/04/2022 16:37   VAS [place] LOWER EXTREMITY VENOUS (DVT)  Result Date: 12/02/2022  Lower Venous DVT Study Patient Name:  Steven Wolf  Date of Exam:   12/01/2022 Medical Rec #: [id number]          Accession #:    [id number] Date of Birth: 1955-08-23           Patient Gender: M Patient Age:   14 years Exam Location:  [facility] Procedure:      VAS [place] LOWER EXTREMITY VENOUS (DVT) Referring Phys: Marguerita Merles --------------------------------------------------------------------------------  Indications: Swelling, and Edema.  Risk Factors: Surgery 11/22/22. Comparison Study: No prior study Performing Technologist: Shona Simpson  Examination Guidelines: A complete evaluation includes B-mode imaging, spectral Doppler, color Doppler, and power Doppler as needed of all accessible portions of each vessel. Bilateral testing is considered an integral part of a complete examination. Limited examinations for reoccurring indications may be performed as noted. The reflux portion of the exam is performed with the patient in reverse Trendelenburg.  +---------+---------------+---------+-----------+----------+--------------+ RIGHT    CompressibilityPhasicitySpontaneityPropertiesThrombus Aging +---------+---------------+---------+-----------+----------+--------------+ CFV      Full           Yes      Yes                                 +---------+---------------+---------+-----------+----------+--------------+ SFJ      Full                                                        +---------+---------------+---------+-----------+----------+--------------+  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 Possible infection around his J-tube leakage J-tube 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 Bilateral pleural effusion  Hypoalbuminemia with third spacing  Malignant Ascites  Normocytic anemia  Diabetes mellitus type 2 Acute kidney injury  Generalized debility    Hospital Course: Patient was a 67 year old male with HLP, HTN, BPH, DM, OSA on CPAP, 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 Recent PET scan 11/28/2022 showed new findings of hypermetabolic nodule metathesis and probably progressive peritoneal carcinomatosis, increased ascites although diabetes mellitus could have similar finding.  He was also noted to have bilateral new pleural effusions which was worse on the left. Chest x-ray showed pleural effusion, underwent thoracentesis 1.7 L from left and 0.9L from right Surgery and wound care consulted Surgery did not feel he needed a surgical intervention at this time and recommended palliative goals of care.    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  -Goals of care were addressed and family requested comfort care status. Patient was followed closely by palliative medicine.        Shortness of breath, pleural effusion bilateral Peripheral edema, ascites Hypoalbuminemia with third spacing: - COVID RSV influenza panel was negative  - Thoracentesis by CCM on 11/25/2022 removed 1.7 L from left and 0.9 from right, received IV albumin and Lasix. Had undergone paracentesis on 9/7, 1.7 L removed - TTE with grossly normal LV function - LE duplex negative for DVT -Patient noted to be somewhat more short of breath, distended abdomen on 9/12, underwent paracentesis on 9/12, 1 L removed.  Received IV albumin, fluid bolus and midodrine for hypotension postprocedure -On 9/13, patient noted to have labored breathing, somnolent, deterioating clinical status with worsening renal function, 2.27, borderline BP.  Family then requested to focus on comfort and not prolong any suffering.  No further escalation of care.   Suspected sepsis initially Leukocytosis  -Afebrile, procalcitonin borderline 0.6. Initially concern for peritonitis and possible infection around J-tube, hence received empiric cefepime and Flagyl. -Pleural fluid culture showed no growth.  UA unremarkable. -Paracentesis completed, 1.7 L fluid removed, negative for SBP on 9/7, s/p paracentesis on 9/12, 1 L removed     Normocytic Anemia, Anemia of chronic disease: H&H stable     Oral Thrush Received nystatin   T2DM: Blood sugar stable   Acute kidney injury -Creatinine slowly trended up, 2.27 -Patient was started on midodrine 10 mg 3 times daily, IV albumin   Generalized deconditioning, debility Estimated body mass index 23.02 kg/m as calculated from the following:  Height as of this encounter: 6' (1.829 m).   Weight as of this encounter: 77 kg.  Patient passed on 12-18-22 at 1:35 AM.        Consultations:  Oncology General Surgery Palliative medicine  The results of significant diagnostics from this hospitalization (including imaging, microbiology, ancillary and laboratory) are listed below for reference.   Significant Diagnostic Studies: US Paracentesis  Result Date: 12/05/2022 INDICATION: Metastatic gastric cancer and peritoneal carcinomatosis with ascites. Request received for therapeutic paracentesis. EXAM: ULTRASOUND GUIDED  PARACENTESIS MEDICATIONS: 6 cc 1% lidocaine COMPLICATIONS: None immediate. PROCEDURE: Informed written consent was obtained from the patient after a discussion of the risks, benefits and alternatives to treatment. A timeout was performed prior to the initiation of the procedure. Initial ultrasound scanning demonstrates a moderate amount of ascites within the left upper abdominal quadrant. The left upper abdomen was prepped and draped in the usual sterile fashion. 1% lidocaine was used for local anesthesia. Following this, a 19 gauge, 7-cm, Yueh catheter was introduced. An ultrasound image was saved for documentation purposes. The paracentesis was performed. The catheter was removed and a dressing was applied. The patient tolerated the procedure well without immediate post procedural complication. FINDINGS: A total of approximately 1 L of yellow fluid was removed. Ordering provider did not request laboratory samples. IMPRESSION: Successful ultrasound-guided paracentesis yielding 1 liter of peritoneal fluid. Procedure performed by Mina Marble, PA-C Electronically Signed   By: Marliss Coots M.D.   On: 12/05/2022 12:11   DG Chest Port 1 View  Result Date: 12/04/2022 CLINICAL DATA:  Shortness of breath EXAM: PORTABLE CHEST 1 VIEW COMPARISON:  11/30/2022 FINDINGS: Power port in place from a left-sided approach with the tip at the SVC  RA junction. The patient has developed bilateral pleural effusions with atelectasis and or infiltrate in both lower lobes. The upper lungs are clear. IMPRESSION: New bilateral pleural effusions with atelectasis and or infiltrate in both lower lobes. Electronically Signed   By: Paulina Fusi M.D.   On: 12/04/2022 16:37   VAS [place] LOWER EXTREMITY VENOUS (DVT)  Result Date: 12/02/2022  Lower Venous DVT Study Patient Name:  Steven Wolf  Date of Exam:   12/01/2022 Medical Rec #: [id number]          Accession #:    [id number] Date of Birth: 1955-08-23           Patient Gender: M Patient Age:   14 years Exam Location:  [facility] Procedure:      VAS [place] LOWER EXTREMITY VENOUS (DVT) Referring Phys: Marguerita Merles --------------------------------------------------------------------------------  Indications: Swelling, and Edema.  Risk Factors: Surgery 11/22/22. Comparison Study: No prior study Performing Technologist: Shona Simpson  Examination Guidelines: A complete evaluation includes B-mode imaging, spectral Doppler, color Doppler, and power Doppler as needed of all accessible portions of each vessel. Bilateral testing is considered an integral part of a complete examination. Limited examinations for reoccurring indications may be performed as noted. The reflux portion of the exam is performed with the patient in reverse Trendelenburg.  +---------+---------------+---------+-----------+----------+--------------+ RIGHT    CompressibilityPhasicitySpontaneityPropertiesThrombus Aging +---------+---------------+---------+-----------+----------+--------------+ CFV      Full           Yes      Yes                                 +---------+---------------+---------+-----------+----------+--------------+ SFJ      Full                                                        +---------+---------------+---------+-----------+----------+--------------+  Height as of this encounter: 6' (1.829 m).   Weight as of this encounter: 77 kg.  Patient passed on 12-18-22 at 1:35 AM.        Consultations:  Oncology General Surgery Palliative medicine  The results of significant diagnostics from this hospitalization (including imaging, microbiology, ancillary and laboratory) are listed below for reference.   Significant Diagnostic Studies: US Paracentesis  Result Date: 12/05/2022 INDICATION: Metastatic gastric cancer and peritoneal carcinomatosis with ascites. Request received for therapeutic paracentesis. EXAM: ULTRASOUND GUIDED  PARACENTESIS MEDICATIONS: 6 cc 1% lidocaine COMPLICATIONS: None immediate. PROCEDURE: Informed written consent was obtained from the patient after a discussion of the risks, benefits and alternatives to treatment. A timeout was performed prior to the initiation of the procedure. Initial ultrasound scanning demonstrates a moderate amount of ascites within the left upper abdominal quadrant. The left upper abdomen was prepped and draped in the usual sterile fashion. 1% lidocaine was used for local anesthesia. Following this, a 19 gauge, 7-cm, Yueh catheter was introduced. An ultrasound image was saved for documentation purposes. The paracentesis was performed. The catheter was removed and a dressing was applied. The patient tolerated the procedure well without immediate post procedural complication. FINDINGS: A total of approximately 1 L of yellow fluid was removed. Ordering provider did not request laboratory samples. IMPRESSION: Successful ultrasound-guided paracentesis yielding 1 liter of peritoneal fluid. Procedure performed by Mina Marble, PA-C Electronically Signed   By: Marliss Coots M.D.   On: 12/05/2022 12:11   DG Chest Port 1 View  Result Date: 12/04/2022 CLINICAL DATA:  Shortness of breath EXAM: PORTABLE CHEST 1 VIEW COMPARISON:  11/30/2022 FINDINGS: Power port in place from a left-sided approach with the tip at the SVC  RA junction. The patient has developed bilateral pleural effusions with atelectasis and or infiltrate in both lower lobes. The upper lungs are clear. IMPRESSION: New bilateral pleural effusions with atelectasis and or infiltrate in both lower lobes. Electronically Signed   By: Paulina Fusi M.D.   On: 12/04/2022 16:37   VAS [place] LOWER EXTREMITY VENOUS (DVT)  Result Date: 12/02/2022  Lower Venous DVT Study Patient Name:  Steven Wolf  Date of Exam:   12/01/2022 Medical Rec #: [id number]          Accession #:    [id number] Date of Birth: 1955-08-23           Patient Gender: M Patient Age:   14 years Exam Location:  [facility] Procedure:      VAS [place] LOWER EXTREMITY VENOUS (DVT) Referring Phys: Marguerita Merles --------------------------------------------------------------------------------  Indications: Swelling, and Edema.  Risk Factors: Surgery 11/22/22. Comparison Study: No prior study Performing Technologist: Shona Simpson  Examination Guidelines: A complete evaluation includes B-mode imaging, spectral Doppler, color Doppler, and power Doppler as needed of all accessible portions of each vessel. Bilateral testing is considered an integral part of a complete examination. Limited examinations for reoccurring indications may be performed as noted. The reflux portion of the exam is performed with the patient in reverse Trendelenburg.  +---------+---------------+---------+-----------+----------+--------------+ RIGHT    CompressibilityPhasicitySpontaneityPropertiesThrombus Aging +---------+---------------+---------+-----------+----------+--------------+ CFV      Full           Yes      Yes                                 +---------+---------------+---------+-----------+----------+--------------+ SFJ      Full                                                        +---------+---------------+---------+-----------+----------+--------------+

## 2022-12-24 NOTE — Progress Notes (Signed)
At approx 0140 pts sister Lynden Ang comes to [facility]  and states she believes her brother has expired. Writer and [facility] proceeded to room to pronounce time of death. Attending A. Chavez notified. Lynden Ang has reached out to local family members to notify of death, they are in route to hospital.  [facility] notified of death. Eye preparation to be completed prior to transfer to the morgue. Unopened bag of Dilaudid returned to pharmacy. Spouse, [name]  informed of process and will follow up with funeral home information.

## 2022-12-24 DEATH — deceased

## 2022-12-30 ENCOUNTER — Encounter: Payer: Self-pay | Admitting: Genetic Counselor

## 2022-12-30 ENCOUNTER — Telehealth: Payer: Self-pay | Admitting: Genetic Counselor

## 2022-12-30 DIAGNOSIS — Z1379 Encounter for other screening for genetic and chromosomal anomalies: Secondary | ICD-10-CM | POA: Insufficient documentation

## 2022-12-30 NOTE — Telephone Encounter (Signed)
Contacted patient's wife in attempt to disclose results of genetic testing.  LVM with contact information requesting a call back.

## 2022-12-30 NOTE — Telephone Encounter (Signed)
Disclosed negative genetics and VUS in CHEK2 to wife, [name].

## 2022-12-31 ENCOUNTER — Encounter: Payer: Self-pay | Admitting: Genetic Counselor

## 2022-12-31 LAB — FUNGUS CULTURE WITH STAIN

## 2022-12-31 LAB — FUNGUS CULTURE RESULT

## 2022-12-31 LAB — FUNGAL ORGANISM REFLEX

## 2023-01-02 ENCOUNTER — Ambulatory Visit: Payer: Medicare PPO

## 2023-01-02 ENCOUNTER — Other Ambulatory Visit: Payer: Medicare PPO

## 2023-01-02 ENCOUNTER — Ambulatory Visit: Payer: Medicare PPO | Admitting: Hematology

## 2023-01-14 LAB — ACID FAST CULTURE WITH REFLEXED SENSITIVITIES (MYCOBACTERIA): Acid Fast Culture: NEGATIVE

## 2023-01-16 ENCOUNTER — Ambulatory Visit: Payer: Medicare PPO | Admitting: Hematology

## 2023-01-16 ENCOUNTER — Other Ambulatory Visit: Payer: Medicare PPO

## 2023-01-16 ENCOUNTER — Ambulatory Visit: Payer: Medicare PPO

## 2023-05-08 ENCOUNTER — Other Ambulatory Visit (HOSPITAL_COMMUNITY): Payer: Self-pay

## 2023-06-19 ENCOUNTER — Encounter: Payer: Medicare PPO | Admitting: Internal Medicine

## 2024-03-19 IMAGING — CT CT CARDIAC CORONARY ARTERY CALCIUM SCORE
3 series · 14 of 20 positions shown, 16 images · non-contrast
Comparison: 09/03/2013 chest radiograph
COMPARISON: 09/03/2013 chest radiograph

Addendum:
CLINICAL DATA: This over-read does not include interpretation of
cardiac or coronary anatomy or pathology. The coronary calcium score
interpretation by the cardiologist is attached.
TECHNIQUE: A gated, non-contrast computed tomography scan of the heart was
performed using 3mm slice thickness. Axial images were analyzed on a
dedicated workstation. Calcium scoring of the coronary arteries was
performed using the Agatston method.

[Series 2: cascseq 2.0 sa36 70% (id) · axial · 0.39mm/px · z∈[-236,-146]mm · 4 of 76 slices shown]
[im 16/76  vessel]
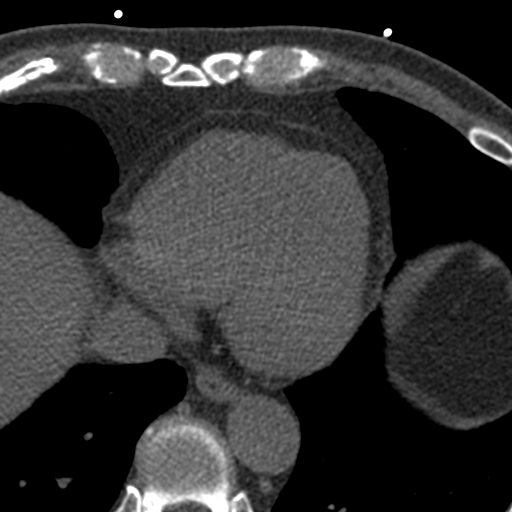
[im 31/76  vessel]
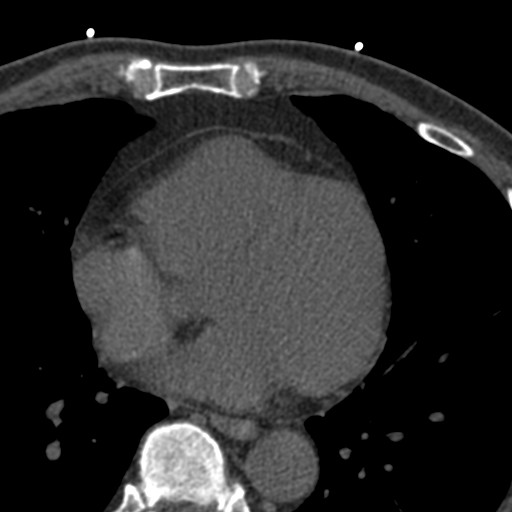
[im 46/76  vessel]
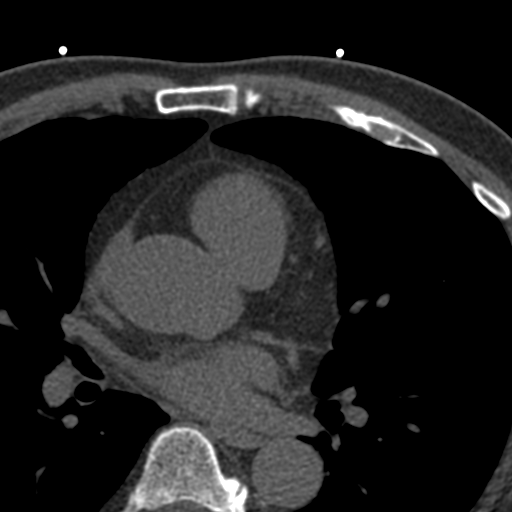
[im 61/76  vessel]
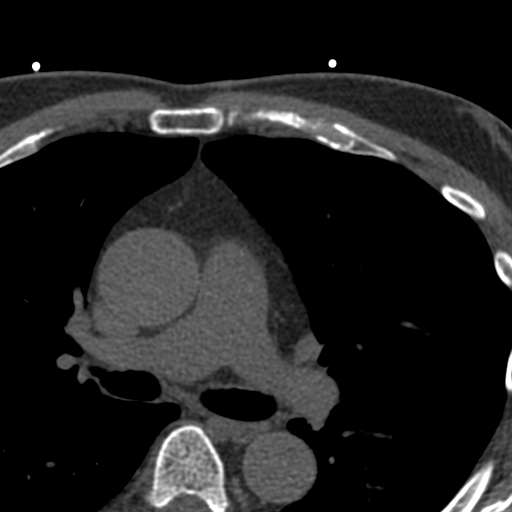

[Series 3: cascseq 2.0 bf37 st · axial · 0.73mm/px · z∈[-242,-142]mm · 5 of 76 slices shown, 7 images]
[im 13/76  vessel]
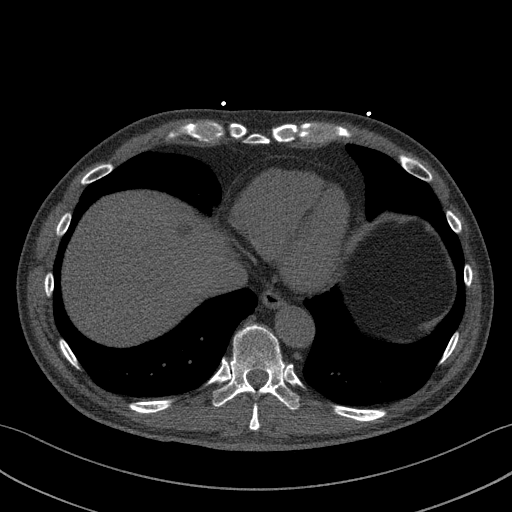
[im 13/76  lung]
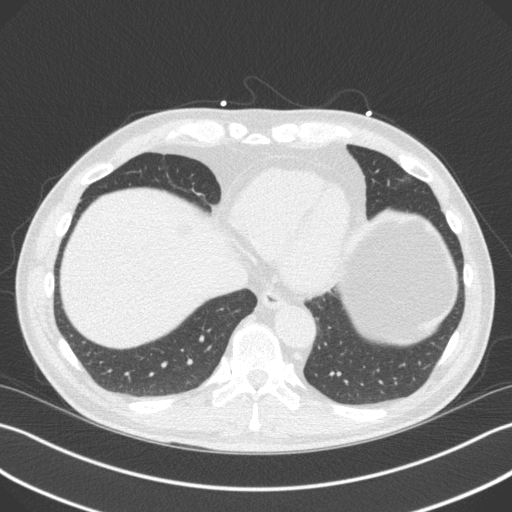
[im 26/76  vessel]
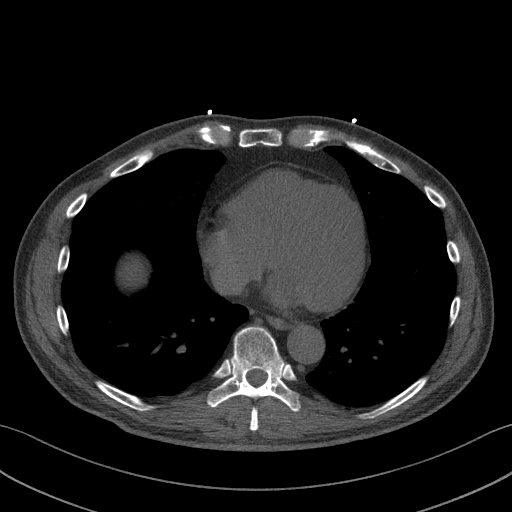
[im 38/76  vessel]
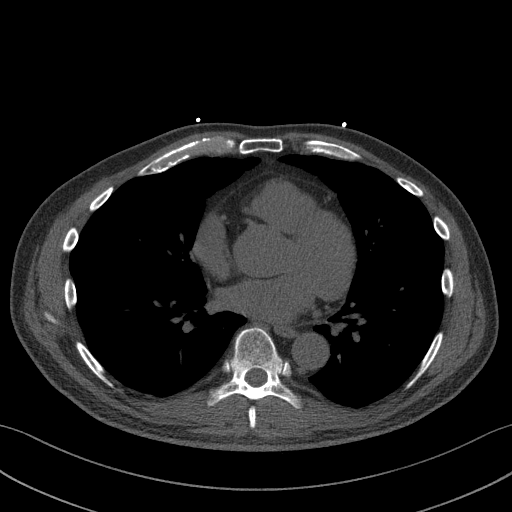
[im 51/76  vessel]
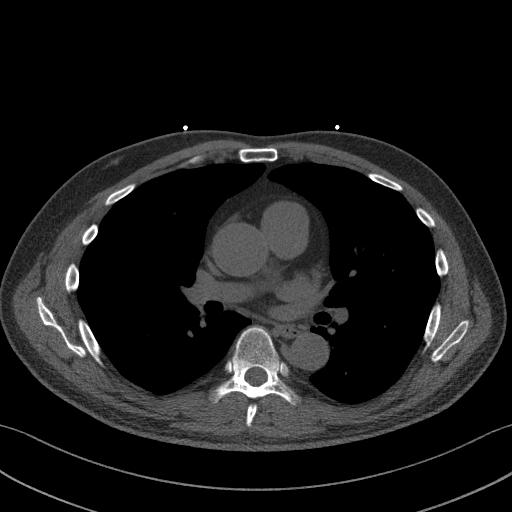
[im 63/76  vessel]
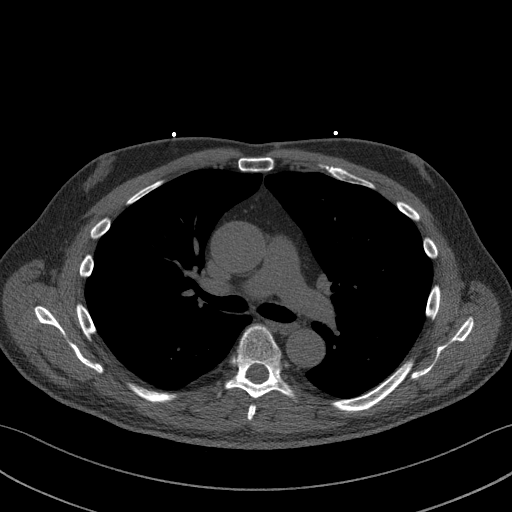
[im 63/76  lung]
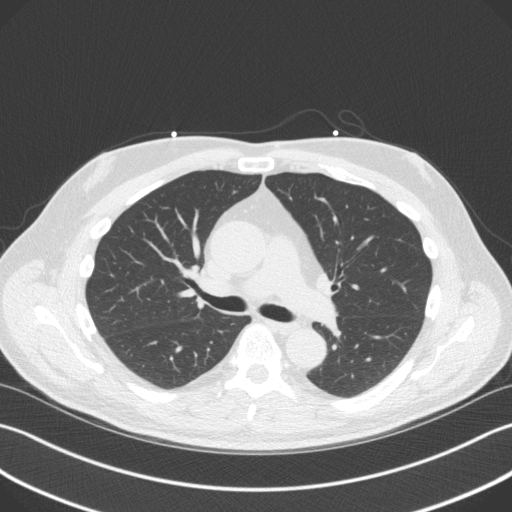

[Series 4: cascseq 2.0 br59 lung · axial · 0.73mm/px · z∈[-242,-142]mm · 5 of 76 slices shown]
[im 13/76  lung]
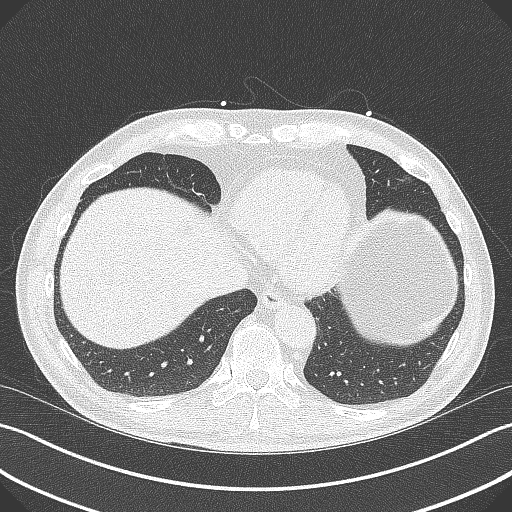
[im 26/76  lung]
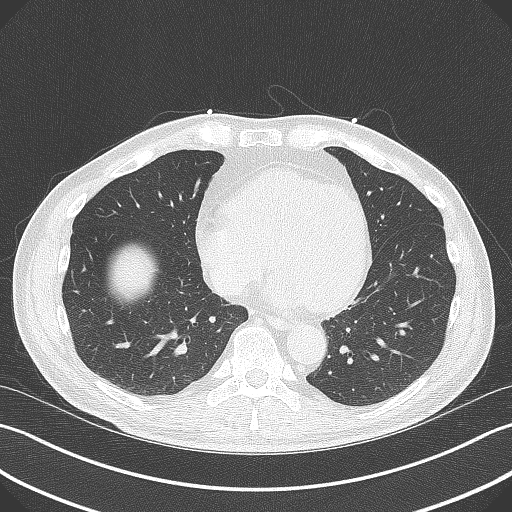
[im 38/76  lung]
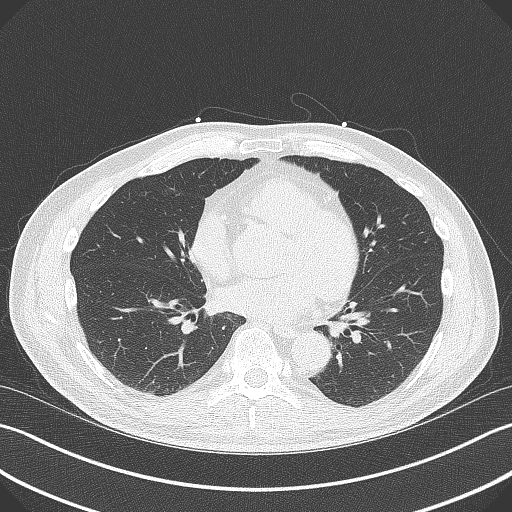
[im 51/76  lung]
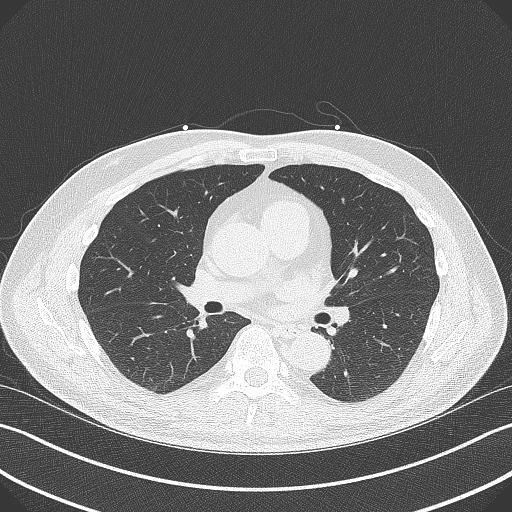
[im 63/76  lung]
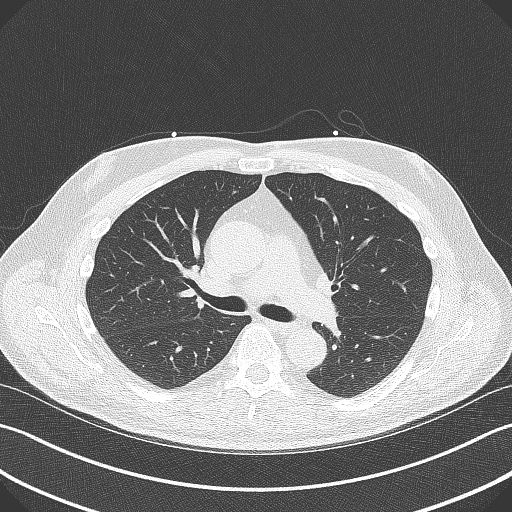

[14 of 20 positions shown; findings below may reference images not displayed]

FINDINGS: Vascular: Normal aortic caliber.  Tortuous thoracic aorta.

Mediastinum/Nodes: No imaged thoracic adenopathy.

Lungs/Pleura: No pleural fluid.  Clear imaged lungs.

Upper Abdomen: Segment 4A 9 mm hepatic cyst. Normal imaged portions
of the spleen, stomach.

Musculoskeletal: Mild, right greater than left gynecomastia. No
acute osseous abnormality.
IMPRESSION: No acute findings in the imaged extracardiac chest.

ADDENDUM:
Cardiovascular Disease Risk stratification

EXAM:
Coronary Calcium Score
FINDINGS: Coronary arteries: Normal origins.

Coronary Calcium Score:

Left main: 0

Left anterior descending artery: 0

Left circumflex artery: 0

Right coronary artery: 0

Total: 0

Percentile: 0

Pericardium: Normal.

Ascending Aorta: Normal caliber.

Non-cardiac: See separate report from [REDACTED].
IMPRESSION: Coronary calcium score of 0. This was 0 percentile for age-, race-,
and sex-matched controls.



If CAC=0, it is reasonable to withhold statin therapy and reassess
in 5 to 10 years, as long as higher risk conditions are absent
(diabetes mellitus, family history of premature CHD in first degree
relatives (males <55 years; females <65 years), cigarette smoking,
or LDL >=190 mg/dL).

If CAC is 1 to 99, it is reasonable to initiate statin therapy for
patients >=55 years of age.

If CAC is >=100 or >=75th percentile, it is reasonable to initiate
statin therapy at any age.

Cardiology referral should be considered for patients with CAC
scores >=400 or >=75th percentile.

*2762 AHA/ACC/AACVPR/AAPA/ABC/LIZET/JACQUES HERVE/AMA/Masand/TIGER/LUSIARDO/YOUPI
Guideline on the Management of Blood Cholesterol: A Report of the
American College of Cardiology/American Heart Association Task Force
on Clinical Practice Guidelines. J Am Coll Cardiol.
9536;73(24):6819-6281.

Deeqa Rayaan Adlaho, DO

The noncardiac portion of this study will be interpreted in separate
report by the radiologist.

*** End of Addendum ***
FINDINGS: Vascular: Normal aortic caliber.  Tortuous thoracic aorta.

Mediastinum/Nodes: No imaged thoracic adenopathy.

Lungs/Pleura: No pleural fluid.  Clear imaged lungs.

Upper Abdomen: Segment 4A 9 mm hepatic cyst. Normal imaged portions
of the spleen, stomach.

Musculoskeletal: Mild, right greater than left gynecomastia. No
acute osseous abnormality.
IMPRESSION: No acute findings in the imaged extracardiac chest.
